# Patient Record
Sex: Female | Born: 1955 | Race: Black or African American | Hispanic: No | Marital: Married | Smoking: Never smoker
Health system: Southern US, Community
[De-identification: ages and names within clinical notes are randomized; demographics above are authoritative.]

## PROBLEM LIST (undated history)

## (undated) DIAGNOSIS — R633 Feeding difficulties, unspecified: Secondary | ICD-10-CM

## (undated) DIAGNOSIS — F329 Major depressive disorder, single episode, unspecified: Secondary | ICD-10-CM

## (undated) DIAGNOSIS — N179 Acute kidney failure, unspecified: Secondary | ICD-10-CM

## (undated) DIAGNOSIS — R1310 Dysphagia, unspecified: Secondary | ICD-10-CM

## (undated) DIAGNOSIS — I10 Essential (primary) hypertension: Secondary | ICD-10-CM

## (undated) DIAGNOSIS — M1990 Unspecified osteoarthritis, unspecified site: Secondary | ICD-10-CM

## (undated) DIAGNOSIS — G894 Chronic pain syndrome: Secondary | ICD-10-CM

## (undated) DIAGNOSIS — E119 Type 2 diabetes mellitus without complications: Secondary | ICD-10-CM

## (undated) DIAGNOSIS — R252 Cramp and spasm: Secondary | ICD-10-CM

## (undated) DIAGNOSIS — M24561 Contracture, right knee: Secondary | ICD-10-CM

## (undated) DIAGNOSIS — G9341 Metabolic encephalopathy: Secondary | ICD-10-CM

## (undated) DIAGNOSIS — T40601A Poisoning by unspecified narcotics, accidental (unintentional), initial encounter: Secondary | ICD-10-CM

## (undated) DIAGNOSIS — E039 Hypothyroidism, unspecified: Secondary | ICD-10-CM

## (undated) DIAGNOSIS — N390 Urinary tract infection, site not specified: Secondary | ICD-10-CM

## (undated) DIAGNOSIS — G20 Parkinson's disease: Secondary | ICD-10-CM

## (undated) DIAGNOSIS — B351 Tinea unguium: Secondary | ICD-10-CM

## (undated) DIAGNOSIS — G8100 Flaccid hemiplegia affecting unspecified side: Secondary | ICD-10-CM

## (undated) DIAGNOSIS — I639 Cerebral infarction, unspecified: Secondary | ICD-10-CM

## (undated) DIAGNOSIS — I69354 Hemiplegia and hemiparesis following cerebral infarction affecting left non-dominant side: Secondary | ICD-10-CM

## (undated) DIAGNOSIS — D696 Thrombocytopenia, unspecified: Secondary | ICD-10-CM

## (undated) DIAGNOSIS — M6281 Muscle weakness (generalized): Secondary | ICD-10-CM

## (undated) DIAGNOSIS — F29 Unspecified psychosis not due to a substance or known physiological condition: Secondary | ICD-10-CM

## (undated) DIAGNOSIS — K5900 Constipation, unspecified: Secondary | ICD-10-CM

## (undated) DIAGNOSIS — M24562 Contracture, left knee: Secondary | ICD-10-CM

## (undated) DIAGNOSIS — J9690 Respiratory failure, unspecified, unspecified whether with hypoxia or hypercapnia: Secondary | ICD-10-CM

## (undated) DIAGNOSIS — G20A1 Parkinson's disease without dyskinesia, without mention of fluctuations: Secondary | ICD-10-CM

## (undated) DIAGNOSIS — G4730 Sleep apnea, unspecified: Secondary | ICD-10-CM

## (undated) DIAGNOSIS — H04123 Dry eye syndrome of bilateral lacrimal glands: Secondary | ICD-10-CM

## (undated) DIAGNOSIS — F32A Depression, unspecified: Secondary | ICD-10-CM

## (undated) DIAGNOSIS — I739 Peripheral vascular disease, unspecified: Secondary | ICD-10-CM

## (undated) DIAGNOSIS — A419 Sepsis, unspecified organism: Secondary | ICD-10-CM

## (undated) DIAGNOSIS — F5101 Primary insomnia: Secondary | ICD-10-CM

## (undated) DIAGNOSIS — M353 Polymyalgia rheumatica: Secondary | ICD-10-CM

## (undated) DIAGNOSIS — E785 Hyperlipidemia, unspecified: Secondary | ICD-10-CM

## (undated) DIAGNOSIS — H269 Unspecified cataract: Secondary | ICD-10-CM

## (undated) DIAGNOSIS — R32 Unspecified urinary incontinence: Secondary | ICD-10-CM

## (undated) DIAGNOSIS — B372 Candidiasis of skin and nail: Secondary | ICD-10-CM

## (undated) DIAGNOSIS — K219 Gastro-esophageal reflux disease without esophagitis: Secondary | ICD-10-CM

## (undated) DIAGNOSIS — J9600 Acute respiratory failure, unspecified whether with hypoxia or hypercapnia: Secondary | ICD-10-CM

## (undated) DIAGNOSIS — Z87442 Personal history of urinary calculi: Secondary | ICD-10-CM

## (undated) DIAGNOSIS — G40909 Epilepsy, unspecified, not intractable, without status epilepticus: Secondary | ICD-10-CM

## (undated) DIAGNOSIS — G8929 Other chronic pain: Secondary | ICD-10-CM

## (undated) HISTORY — PX: PICC LINE INSERTION: CATH118290

---

## 1997-03-09 HISTORY — PX: CEREBRAL ANEURYSM REPAIR: SHX164

## 2006-07-05 ENCOUNTER — Inpatient Hospital Stay (HOSPITAL_COMMUNITY): Admission: EM | Admit: 2006-07-05 | Discharge: 2006-08-03 | Payer: Self-pay | Admitting: *Deleted

## 2006-07-05 ENCOUNTER — Ambulatory Visit: Payer: Self-pay | Admitting: Internal Medicine

## 2006-07-06 ENCOUNTER — Encounter (INDEPENDENT_AMBULATORY_CARE_PROVIDER_SITE_OTHER): Payer: Self-pay | Admitting: Cardiology

## 2006-07-15 ENCOUNTER — Ambulatory Visit: Payer: Self-pay | Admitting: Internal Medicine

## 2006-11-21 ENCOUNTER — Emergency Department (HOSPITAL_COMMUNITY): Admission: EM | Admit: 2006-11-21 | Discharge: 2006-11-21 | Payer: Self-pay | Admitting: Emergency Medicine

## 2008-03-09 HISTORY — PX: INTRATHECAL PUMP IMPLANTATION: SHX1844

## 2008-06-24 ENCOUNTER — Emergency Department (HOSPITAL_COMMUNITY): Admission: EM | Admit: 2008-06-24 | Discharge: 2008-06-24 | Payer: Self-pay | Admitting: Emergency Medicine

## 2009-02-05 ENCOUNTER — Emergency Department (HOSPITAL_COMMUNITY): Admission: EM | Admit: 2009-02-05 | Discharge: 2009-02-05 | Payer: Self-pay | Admitting: Emergency Medicine

## 2009-08-06 ENCOUNTER — Inpatient Hospital Stay (HOSPITAL_COMMUNITY): Admission: EM | Admit: 2009-08-06 | Discharge: 2009-08-09 | Payer: Self-pay | Admitting: Emergency Medicine

## 2009-08-11 ENCOUNTER — Inpatient Hospital Stay (HOSPITAL_COMMUNITY): Admission: EM | Admit: 2009-08-11 | Discharge: 2009-08-21 | Payer: Self-pay | Admitting: Emergency Medicine

## 2010-01-27 ENCOUNTER — Inpatient Hospital Stay (HOSPITAL_COMMUNITY)
Admission: EM | Admit: 2010-01-27 | Discharge: 2010-02-04 | Payer: Self-pay | Source: Home / Self Care | Admitting: Emergency Medicine

## 2010-02-01 ENCOUNTER — Other Ambulatory Visit: Payer: Self-pay | Admitting: Internal Medicine

## 2010-02-02 ENCOUNTER — Other Ambulatory Visit: Payer: Self-pay | Admitting: Internal Medicine

## 2010-02-03 ENCOUNTER — Other Ambulatory Visit: Payer: Self-pay | Admitting: Internal Medicine

## 2010-02-04 ENCOUNTER — Other Ambulatory Visit: Payer: Self-pay | Admitting: Internal Medicine

## 2010-03-08 ENCOUNTER — Emergency Department (HOSPITAL_COMMUNITY)
Admission: EM | Admit: 2010-03-08 | Discharge: 2010-03-08 | Payer: Self-pay | Source: Home / Self Care | Admitting: Emergency Medicine

## 2010-03-08 ENCOUNTER — Inpatient Hospital Stay (HOSPITAL_COMMUNITY)
Admission: EM | Admit: 2010-03-08 | Discharge: 2010-03-14 | Payer: Self-pay | Source: Home / Self Care | Attending: Internal Medicine | Admitting: Internal Medicine

## 2010-03-11 DIAGNOSIS — F29 Unspecified psychosis not due to a substance or known physiological condition: Secondary | ICD-10-CM

## 2010-03-12 DIAGNOSIS — F29 Unspecified psychosis not due to a substance or known physiological condition: Secondary | ICD-10-CM

## 2010-03-12 LAB — BASIC METABOLIC PANEL
BUN: 13 mg/dL (ref 6–23)
CO2: 28 mEq/L (ref 19–32)
Calcium: 9 mg/dL (ref 8.4–10.5)
Chloride: 110 mEq/L (ref 96–112)
Creatinine, Ser: 0.94 mg/dL (ref 0.4–1.2)
GFR calc Af Amer: >60 mL/min (ref >60)
GFR calc non Af Amer: >60 mL/min (ref >60)
Glucose, Bld: 93 mg/dL (ref 70–99)
Potassium: 3.2 mEq/L — ABNORMAL LOW (ref 3.5–5.1)
Sodium: 148 mEq/L — ABNORMAL HIGH (ref 135–145)

## 2010-03-12 LAB — CBC
HCT: 35.3 % — ABNORMAL LOW (ref 36.0–46.0)
Hemoglobin: 11.1 g/dL — ABNORMAL LOW (ref 12.0–15.0)
MCH: 27.3 pg (ref 26.0–34.0)
MCHC: 31.4 g/dL (ref 30.0–36.0)
MCV: 86.9 fL (ref 78.0–100.0)
Platelets: 159 10*3/uL (ref 150–400)
RBC: 4.06 MIL/uL (ref 3.87–5.11)
RDW: 15.5 % (ref 11.5–15.5)
WBC: 5.6 10*3/uL (ref 4.0–10.5)

## 2010-03-12 LAB — ALBUMIN: Albumin: 2.9 g/dL — ABNORMAL LOW (ref 3.5–5.2)

## 2010-03-12 LAB — PHENYTOIN LEVEL, TOTAL: Phenytoin Lvl: 12.1 ug/mL (ref 10.0–20.0)

## 2010-03-13 DIAGNOSIS — F29 Unspecified psychosis not due to a substance or known physiological condition: Secondary | ICD-10-CM

## 2010-03-13 LAB — CK: Total CK: 306 U/L — ABNORMAL HIGH (ref 7–177)

## 2010-03-14 DIAGNOSIS — F29 Unspecified psychosis not due to a substance or known physiological condition: Secondary | ICD-10-CM

## 2010-03-14 HISTORY — DX: Unspecified psychosis not due to a substance or known physiological condition: F29

## 2010-03-14 LAB — CK: Total CK: 241 U/L — ABNORMAL HIGH (ref 7–177)

## 2010-04-04 NOTE — H&P (Addendum)
NAMECHLORA, MCBAIN               ACCOUNT NO.:  [id number]  MEDICAL RECORD NO.:  [id number]          PATIENT TYPE:  EMS  LOCATION:  MAJO                         FACILITY:  [facility]  PHYSICIAN:  Richarda Overlie, MD       DATE OF BIRTH:  October 06, 1955  DATE OF ADMISSION:  03/09/2010 DATE OF DISCHARGE:                             HISTORY & PHYSICAL   PRIMARY CARE PHYSICIAN:  Maxwell Caul, MD  PAIN MEDICINE PHYSICIAN:  Dr. Jason Coop.  PRIMARY NEUROLOGIST:  Kofi A. Gerilyn Pilgrim, MD  CHIEF COMPLAINT:  Apnea at the nursing home.  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  The patient had apparently had a seizure at around 9:25 at the nursing home following which the patient was found to have shallow respirations as well as several episodes of apnea with a rate of 4-6 breaths per minute. She was sent to [facility] Emergency Room from the nursing home on March 08, 2010.  She was evaluated at [facility] ED and her workup was essentially negative.  She was sent back to the nursing home as she continued to have apneic episodes.  Upon arrival to the facility, the patient had a blood pressure 160/92, temperature 98.7, pulse of 90, and respirations of 10.  Oxygen saturation was found to be 96% on room air. The MD on call at the nursing home was notified that the residence respirations were about 6-10 per minute and the nursing home was instructed to send the patient back to [facility] ED.  The patient's apnea is mostly at rest and when she is asleep.  She does not recall the episode of seizure.  A postictal state was apparently prolonged for several hours.  The patient is currently alert and oriented  and is able to answer questions appropriately.  She is somnolent, but not lethargic and is easily aroused.  The patient denies any chest pain, any palpitations, any nausea, vomiting, abdominal pain, any diarrhea or constipation, any urinary urgency, frequency, or dysuria.  She is also found to have a mild urinary tract infection in the ED today.  The patient's seizures mostly appeared to be staring off into space without any tonic-clonic activity followed by prolonged episode of confusion and unresponsiveness.  The patient is currently hemodynamically stable and has been placed on BiPAP in the ED.  The patient had a similar episode in November 2011 when her fentanyl and her baclofen pump were adjusted by Dr. Charmayne Sheer.  It was also felt that the patient's apnea could be related to recurrent seizures and therefore the patient has been loaded with Dilantin in the ED here.  The patient was intubated for airway protection during her last hospitalization in November 2011.  Hypercarbic respiratory failure requiring intubation and mechanical ventilation in November 2011, central apnea with hypoventilation. Breakthrough seizures, on Keppra.  Chronic spasticity.  History of hemorrhagic CVA, history of aspiration pneumonia, hypothyroidism, spastic hemiparesis, status post tracheostomy and gastrostomy tube placement in May 2008 at the time of her hemorrhagic stroke, and parkinsonism.  Neuropathic pain, treated with fentanyl subcutaneously. Diet-controlled type 2 diabetes.  Depression.  Left-sided hemiparesis and nonambulatory status secondary to CVA, status post craniotomy with clipping of an aneurysm in 1999.  HOME MEDICATIONS:  Fentanyl pump, baclofen pump, vitamin D3, milk of magnesium, Tylenol, trazodone, omeprazole, Patanol, levothyroxine, Keppra, labetalol, lactulose, enalapril, Cymbalta, Sinemet, Ativan, aspirin, artificial tears, and Norvasc.  ALLERGIES:  The patient is allergic to  CODEINE.  SOCIAL HISTORY:  The patient is married with a resident of [employer].  She has 2 children.  She is a former Designer, jewellery.  She has no history of tobacco, alcohol, or illicit drug use.  FAMILY HISTORY:  Her mother is in her 85s and she has a history of hypertension and diabetes.  Father has a history of hypertension and type 2 diabetes.  REVIEW OF SYSTEMS:  Complete review of systems was done as documented in the HPI.  PHYSICAL EXAMINATION:  VITAL SIGNS:  Blood pressure 182/104, pulse of 115, respirations 18, and temperature 98.2. GENERAL:  The patient is alert, currently oriented to place and person, morbidly obese, but in no acute cardiopulmonary distress. HEENT:  Normocephalic and atraumatic.  Pupils are equal and reactive to light.  Extraocular movements are intact. NECK:  Supple.  No adenopathy.  No thyromegaly.  No bruit.  No JVD.  She has a former tracheostomy scar. HEART:  S1 and S2 with 2/6 systolic ejection murmur. RESPIRATIONS:  The breathing is shallow, however, when she is awake her respiratory rate is normal. ABDOMEN:  Obese, soft, nontender, and nondistended.  Normoactive bowel sounds. NEUROLOGIC:  The patient is currently alert without any gross focal cranial nerve deficits.  She has contractures in her left upper extremity and her left lower extremity.  She has decreased sensation on the left and intact on the right.  She has currently an appropriate mood and affect.  LABORATORY DATA:  TSH 1.609.  ABG:  PH of 7.421, pCO2 of 48.7, and pO2 of 86.  CMP:  Sodium 144, potassium 3.5, chloride 103, bicarb 30, glucose 77, BUN 19, creatinine 0.90, T-bili 0.6, alk phos 91, AST 18, ALT 9, total protein 7.7, and calcium 9.4.  WBC 6.9, hemoglobin 11.6, hematocrit 30.5, and platelet count of 147.  Urinalysis shows small amount of leukocyte esterase and 11-20 wbc's per high-power field.  CBC: WBC 6.4, hemoglobin 11.3, hematocrit 35.2,  and platelet count of 162.  CT of the head without contrast shows no acute intracranial findings.  ASSESSMENT AND PLAN:  This is a 55 year old female with a history of spastic hemiparesis secondary to hemorrhagic cerebrovascular accident, currently presenting with recurrent seizure and apneic episodes. 1. Apnea:  The patient has been noted to have apnea post her episodes     of seizure.  The patient did have an EEG done in November 2011 that     showed possible increased seizure activity and the dose of her     Keppra was increased to 2000 mg p.o. q.12.  She is also receiving a     loading dose of Dilantin.  The patient also has a component of     central apnea related to her hemorrhagic CVA.  It is possible that     the patient may be a long-term candidate for BiPAP at night when  she is asleep.  It is also possible that the patient needs further     decrease of her fentanyl and her baclofen pump which may be     contributing to the apnea.  This was initially adjusted by Dr. Charmayne Sheer from [facility].  A neurology consultation may be     obtained in the morning to receive further recommendations for     adjustment of this. 2. Urinary tract infection:  The patient will be treated with Rocephin    and a urine culture will be obtained. 3. Diabetes:  This appears to be controlled at this time, therefore no     NovoLog is being initiated. 4. Hypothyroidism:  The patient may be restarted on her Synthroid and     other p.o. medications after a speech evaluation is completed. 5. Parkinsonism:  The patient will be continued on her Sinemet. 6. History of hemorrhagic stroke:  The patient had a CT of the head     without contrast which appears to be stable.  If she continues to     have any worsening of her neurologic symptoms, a CT scan with     contrast may be obtained.  She is not a candidate for MRI because     of surgical clips in her brain. 7. Spastic hemiparesis:  The  patient is on fentanyl and baclofen pump     for this, which is managed by Dr. Windle Guard at [facility],     phone number [phone]. 8. Potential dysphasia:  A speech therapy consultation will be     obtained in the morning.  Until then, the patient will remain     n.p.o. except for medications only. 9. Disposition:  She is a full code.     Richarda Overlie, MD     NA/MEDQ  D:  03/09/2010  T:  03/09/2010  Job:  [id number]  Electronically Signed by Richarda Overlie MD on 04/04/2010 07:22:53 AM

## 2010-04-09 DIAGNOSIS — G4730 Sleep apnea, unspecified: Secondary | ICD-10-CM

## 2010-04-09 HISTORY — DX: Sleep apnea, unspecified: G47.30

## 2010-04-10 ENCOUNTER — Ambulatory Visit: Payer: Medicare Other | Attending: Neurology

## 2010-04-10 DIAGNOSIS — Z6839 Body mass index (BMI) 39.0-39.9, adult: Secondary | ICD-10-CM | POA: Insufficient documentation

## 2010-04-10 DIAGNOSIS — G4733 Obstructive sleep apnea (adult) (pediatric): Secondary | ICD-10-CM | POA: Insufficient documentation

## 2010-04-23 ENCOUNTER — Encounter (HOSPITAL_COMMUNITY): Payer: Self-pay | Admitting: Radiology

## 2010-04-23 ENCOUNTER — Emergency Department (HOSPITAL_COMMUNITY): Payer: Medicare Other

## 2010-04-23 ENCOUNTER — Inpatient Hospital Stay (HOSPITAL_COMMUNITY)
Admission: EM | Admit: 2010-04-23 | Discharge: 2010-05-06 | DRG: 071 | Disposition: A | Discharge status: Skilled Nursing Facility | Payer: Medicare Other | Attending: Family Medicine | Admitting: Family Medicine

## 2010-04-23 DIAGNOSIS — D638 Anemia in other chronic diseases classified elsewhere: Secondary | ICD-10-CM | POA: Diagnosis not present

## 2010-04-23 DIAGNOSIS — G9341 Metabolic encephalopathy: Principal | ICD-10-CM | POA: Diagnosis present

## 2010-04-23 DIAGNOSIS — G20A1 Parkinson's disease without dyskinesia, without mention of fluctuations: Secondary | ICD-10-CM | POA: Diagnosis present

## 2010-04-23 DIAGNOSIS — Z79899 Other long term (current) drug therapy: Secondary | ICD-10-CM

## 2010-04-23 DIAGNOSIS — F329 Major depressive disorder, single episode, unspecified: Secondary | ICD-10-CM | POA: Diagnosis present

## 2010-04-23 DIAGNOSIS — T420X5A Adverse effect of hydantoin derivatives, initial encounter: Secondary | ICD-10-CM | POA: Diagnosis present

## 2010-04-23 DIAGNOSIS — E039 Hypothyroidism, unspecified: Secondary | ICD-10-CM | POA: Diagnosis present

## 2010-04-23 DIAGNOSIS — N390 Urinary tract infection, site not specified: Secondary | ICD-10-CM | POA: Diagnosis present

## 2010-04-23 DIAGNOSIS — D696 Thrombocytopenia, unspecified: Secondary | ICD-10-CM | POA: Diagnosis present

## 2010-04-23 DIAGNOSIS — F3289 Other specified depressive episodes: Secondary | ICD-10-CM | POA: Diagnosis present

## 2010-04-23 DIAGNOSIS — E669 Obesity, unspecified: Secondary | ICD-10-CM | POA: Diagnosis present

## 2010-04-23 DIAGNOSIS — I69959 Hemiplegia and hemiparesis following unspecified cerebrovascular disease affecting unspecified side: Secondary | ICD-10-CM

## 2010-04-23 DIAGNOSIS — G589 Mononeuropathy, unspecified: Secondary | ICD-10-CM | POA: Diagnosis present

## 2010-04-23 DIAGNOSIS — R1310 Dysphagia, unspecified: Secondary | ICD-10-CM | POA: Diagnosis present

## 2010-04-23 DIAGNOSIS — I69998 Other sequelae following unspecified cerebrovascular disease: Secondary | ICD-10-CM

## 2010-04-23 DIAGNOSIS — K219 Gastro-esophageal reflux disease without esophagitis: Secondary | ICD-10-CM | POA: Diagnosis present

## 2010-04-23 DIAGNOSIS — E119 Type 2 diabetes mellitus without complications: Secondary | ICD-10-CM | POA: Diagnosis present

## 2010-04-23 DIAGNOSIS — G20 Parkinson's disease: Secondary | ICD-10-CM | POA: Diagnosis present

## 2010-04-23 DIAGNOSIS — E870 Hyperosmolality and hypernatremia: Secondary | ICD-10-CM | POA: Diagnosis not present

## 2010-04-23 DIAGNOSIS — Z7982 Long term (current) use of aspirin: Secondary | ICD-10-CM

## 2010-04-23 DIAGNOSIS — G40909 Epilepsy, unspecified, not intractable, without status epilepticus: Secondary | ICD-10-CM | POA: Diagnosis present

## 2010-04-23 DIAGNOSIS — B952 Enterococcus as the cause of diseases classified elsewhere: Secondary | ICD-10-CM | POA: Diagnosis present

## 2010-04-23 DIAGNOSIS — G4733 Obstructive sleep apnea (adult) (pediatric): Secondary | ICD-10-CM | POA: Diagnosis present

## 2010-04-23 DIAGNOSIS — I10 Essential (primary) hypertension: Secondary | ICD-10-CM | POA: Diagnosis present

## 2010-04-23 HISTORY — DX: Epilepsy, unspecified, not intractable, without status epilepticus: G40.909

## 2010-04-23 HISTORY — DX: Essential (primary) hypertension: I10

## 2010-04-23 HISTORY — DX: Cerebral infarction, unspecified: I63.9

## 2010-04-23 LAB — APTT: aPTT: 67 seconds — ABNORMAL HIGH (ref 24–37)

## 2010-04-23 LAB — MRSA PCR SCREENING: MRSA by PCR: NEGATIVE

## 2010-04-23 LAB — HEMOGLOBIN A1C: Mean Plasma Glucose: 120 mg/dL — ABNORMAL HIGH (ref <117)

## 2010-04-23 LAB — COMPREHENSIVE METABOLIC PANEL
ALT: 9 U/L (ref 0–35)
AST: 17 U/L (ref 0–37)
CO2: 33 mEq/L — ABNORMAL HIGH (ref 19–32)
Chloride: 103 mEq/L (ref 96–112)
GFR calc Af Amer: >60 mL/min (ref >60)
GFR calc non Af Amer: >60 mL/min (ref >60)
Sodium: 144 mEq/L (ref 135–145)
Total Bilirubin: 0.3 mg/dL (ref 0.3–1.2)

## 2010-04-23 LAB — URINALYSIS, ROUTINE W REFLEX MICROSCOPIC
Ketones, ur: 15 mg/dL — AB
Nitrite: NEGATIVE
Specific Gravity, Urine: 1.03 (ref 1.005–1.030)
Urine Glucose, Fasting: NEGATIVE mg/dL
pH: 6 (ref 5.0–8.0)

## 2010-04-23 LAB — DIFFERENTIAL
Basophils Absolute: 0 10*3/uL (ref 0.0–0.1)
Basophils Relative: 0 % (ref 0–1)
Eosinophils Absolute: 0.1 10*3/uL (ref 0.0–0.7)
Eosinophils Relative: 3 % (ref 0–5)
Lymphocytes Relative: 51 % — ABNORMAL HIGH (ref 12–46)
Monocytes Relative: 12 % (ref 3–12)
Neutro Abs: 1.8 10*3/uL (ref 1.7–7.7)

## 2010-04-23 LAB — PHENYTOIN LEVEL, TOTAL: Phenytoin Lvl: 27.9 ug/mL — ABNORMAL HIGH (ref 10.0–20.0)

## 2010-04-23 LAB — PROTIME-INR: Prothrombin Time: 14.5 seconds (ref 11.6–15.2)

## 2010-04-23 LAB — POCT I-STAT 3, ART BLOOD GAS (G3+)
Bicarbonate: 34.8 mEq/L — ABNORMAL HIGH (ref 20.0–24.0)
pCO2 arterial: 61.5 mmHg (ref 35.0–45.0)
pO2, Arterial: 87 mmHg (ref 80.0–100.0)

## 2010-04-23 LAB — GLUCOSE, CAPILLARY: Glucose-Capillary: 101 mg/dL — ABNORMAL HIGH (ref 70–99)

## 2010-04-23 LAB — BASIC METABOLIC PANEL
CO2: 30 mEq/L (ref 19–32)
Calcium: 8.3 mg/dL — ABNORMAL LOW (ref 8.4–10.5)
Chloride: 101 mEq/L (ref 96–112)
Creatinine, Ser: 0.55 mg/dL (ref 0.4–1.2)
GFR calc Af Amer: >60 mL/min (ref >60)
Glucose, Bld: 96 mg/dL (ref 70–99)

## 2010-04-23 LAB — VALPROIC ACID LEVEL: Valproic Acid Lvl: 58 ug/mL (ref 50.0–100.0)

## 2010-04-23 LAB — RAPID URINE DRUG SCREEN, HOSP PERFORMED
Amphetamines: NOT DETECTED
Opiates: NOT DETECTED
Tetrahydrocannabinol: NOT DETECTED

## 2010-04-23 LAB — CBC
Platelets: 146 10*3/uL — ABNORMAL LOW (ref 150–400)
RBC: 4.49 MIL/uL (ref 3.87–5.11)
RDW: 15 % (ref 11.5–15.5)
WBC: 5.2 10*3/uL (ref 4.0–10.5)

## 2010-04-23 LAB — CARDIAC PANEL(CRET KIN+CKTOT+MB+TROPI): Relative Index: 0.9 (ref 0.0–2.5)

## 2010-04-23 LAB — URINE MICROSCOPIC-ADD ON

## 2010-04-23 LAB — LACTIC ACID, PLASMA: Lactic Acid, Venous: 1.4 mmol/L (ref 0.5–2.2)

## 2010-04-23 LAB — SEDIMENTATION RATE: Sed Rate: 35 mm/hr — ABNORMAL HIGH (ref 0–22)

## 2010-04-24 ENCOUNTER — Inpatient Hospital Stay (HOSPITAL_COMMUNITY): Payer: Medicare Other

## 2010-04-24 LAB — GLUCOSE, CAPILLARY
Glucose-Capillary: 100 mg/dL — ABNORMAL HIGH (ref 70–99)
Glucose-Capillary: 104 mg/dL — ABNORMAL HIGH (ref 70–99)

## 2010-04-24 LAB — CBC
MCH: 27.7 pg (ref 26.0–34.0)
MCHC: 31.4 g/dL (ref 30.0–36.0)
Platelets: 147 10*3/uL — ABNORMAL LOW (ref 150–400)
RBC: 4.65 MIL/uL (ref 3.87–5.11)
RDW: 14.8 % (ref 11.5–15.5)

## 2010-04-24 LAB — DIFFERENTIAL
Basophils Relative: 0 % (ref 0–1)
Eosinophils Absolute: 0.1 10*3/uL (ref 0.0–0.7)
Eosinophils Relative: 3 % (ref 0–5)
Monocytes Relative: 12 % (ref 3–12)
Neutrophils Relative %: 46 % (ref 43–77)

## 2010-04-24 LAB — BASIC METABOLIC PANEL
BUN: 11 mg/dL (ref 6–23)
Calcium: 8.5 mg/dL (ref 8.4–10.5)
Creatinine, Ser: 0.52 mg/dL (ref 0.4–1.2)
GFR calc Af Amer: >60 mL/min (ref >60)
GFR calc non Af Amer: >60 mL/min (ref >60)

## 2010-04-24 LAB — LIPID PANEL
HDL: 62 mg/dL (ref >39)
Total CHOL/HDL Ratio: 4.7 RATIO
VLDL: 20 mg/dL (ref 0–40)

## 2010-04-24 LAB — FOLATE RBC: RBC Folate: 531 ng/mL (ref 180–600)

## 2010-04-24 LAB — CARDIAC PANEL(CRET KIN+CKTOT+MB+TROPI)
Total CK: 130 U/L (ref 7–177)
Troponin I: 0.02 ng/mL (ref 0.00–0.06)

## 2010-04-25 LAB — COMPREHENSIVE METABOLIC PANEL
Albumin: 2.6 g/dL — ABNORMAL LOW (ref 3.5–5.2)
BUN: 11 mg/dL (ref 6–23)
Chloride: 108 mEq/L (ref 96–112)
Creatinine, Ser: 0.58 mg/dL (ref 0.4–1.2)
GFR calc non Af Amer: >60 mL/min (ref >60)
Glucose, Bld: 89 mg/dL (ref 70–99)
Total Bilirubin: 0.3 mg/dL (ref 0.3–1.2)

## 2010-04-25 LAB — DIFFERENTIAL
Eosinophils Absolute: 0.1 10*3/uL (ref 0.0–0.7)
Eosinophils Relative: 2 % (ref 0–5)
Lymphs Abs: 3.2 10*3/uL (ref 0.7–4.0)
Monocytes Relative: 13 % — ABNORMAL HIGH (ref 3–12)
Neutrophils Relative %: 34 % — ABNORMAL LOW (ref 43–77)

## 2010-04-25 LAB — CBC
MCH: 26.8 pg (ref 26.0–34.0)
MCV: 88.1 fL (ref 78.0–100.0)
Platelets: 139 10*3/uL — ABNORMAL LOW (ref 150–400)
RBC: 3.96 MIL/uL (ref 3.87–5.11)

## 2010-04-25 LAB — FERRITIN: Ferritin: 26 ng/mL (ref 10–291)

## 2010-04-25 LAB — GLUCOSE, CAPILLARY
Glucose-Capillary: 181 mg/dL — ABNORMAL HIGH (ref 70–99)
Glucose-Capillary: 137 mg/dL — ABNORMAL HIGH (ref 70–99)

## 2010-04-25 LAB — FOLATE: Folate: 16.6 ng/mL

## 2010-04-25 LAB — IRON AND TIBC: TIBC: 239 ug/dL — ABNORMAL LOW (ref 250–470)

## 2010-04-26 LAB — CBC
Hemoglobin: 10.2 g/dL — ABNORMAL LOW (ref 12.0–15.0)
Platelets: 150 10*3/uL (ref 150–400)
RBC: 3.85 MIL/uL — ABNORMAL LOW (ref 3.87–5.11)
WBC: 5.5 10*3/uL (ref 4.0–10.5)

## 2010-04-26 LAB — GLUCOSE, CAPILLARY
Glucose-Capillary: 130 mg/dL — ABNORMAL HIGH (ref 70–99)
Glucose-Capillary: 116 mg/dL — ABNORMAL HIGH (ref 70–99)
Glucose-Capillary: 100 mg/dL — ABNORMAL HIGH (ref 70–99)
Glucose-Capillary: 142 mg/dL — ABNORMAL HIGH (ref 70–99)

## 2010-04-26 LAB — URINE CULTURE: Colony Count: 50000

## 2010-04-26 LAB — VALPROIC ACID LEVEL: Valproic Acid Lvl: 18.4 ug/mL — ABNORMAL LOW (ref 50.0–100.0)

## 2010-04-26 LAB — BASIC METABOLIC PANEL
CO2: 31 mEq/L (ref 19–32)
Chloride: 104 mEq/L (ref 96–112)
GFR calc Af Amer: >60 mL/min (ref >60)
Sodium: 143 mEq/L (ref 135–145)

## 2010-04-26 LAB — PHENYTOIN LEVEL, TOTAL: Phenytoin Lvl: 28.8 ug/mL — ABNORMAL HIGH (ref 10.0–20.0)

## 2010-04-27 LAB — CBC
HCT: 34.5 % — ABNORMAL LOW (ref 36.0–46.0)
MCH: 27.2 pg (ref 26.0–34.0)
MCV: 88.5 fL (ref 78.0–100.0)
Platelets: 156 10*3/uL (ref 150–400)
RBC: 3.9 MIL/uL (ref 3.87–5.11)

## 2010-04-27 LAB — BASIC METABOLIC PANEL
BUN: 5 mg/dL — ABNORMAL LOW (ref 6–23)
Chloride: 104 mEq/L (ref 96–112)
Creatinine, Ser: 0.46 mg/dL (ref 0.4–1.2)
Glucose, Bld: 93 mg/dL (ref 70–99)
Potassium: 3.8 mEq/L (ref 3.5–5.1)

## 2010-04-27 LAB — GLUCOSE, CAPILLARY
Glucose-Capillary: 87 mg/dL (ref 70–99)
Glucose-Capillary: 101 mg/dL — ABNORMAL HIGH (ref 70–99)

## 2010-04-28 LAB — GLUCOSE, CAPILLARY: Glucose-Capillary: 126 mg/dL — ABNORMAL HIGH (ref 70–99)

## 2010-04-29 LAB — CBC
MCH: 27 pg (ref 26.0–34.0)
MCHC: 31.1 g/dL (ref 30.0–36.0)
MCV: 86.9 fL (ref 78.0–100.0)
Platelets: 175 10*3/uL (ref 150–400)

## 2010-04-29 LAB — GLUCOSE, CAPILLARY
Glucose-Capillary: 91 mg/dL (ref 70–99)
Glucose-Capillary: 100 mg/dL — ABNORMAL HIGH (ref 70–99)
Glucose-Capillary: 104 mg/dL — ABNORMAL HIGH (ref 70–99)

## 2010-04-29 LAB — BASIC METABOLIC PANEL
BUN: 8 mg/dL (ref 6–23)
Creatinine, Ser: 0.54 mg/dL (ref 0.4–1.2)
GFR calc non Af Amer: >60 mL/min (ref >60)
Glucose, Bld: 99 mg/dL (ref 70–99)

## 2010-04-29 LAB — ALBUMIN: Albumin: 2.5 g/dL — ABNORMAL LOW (ref 3.5–5.2)

## 2010-04-30 LAB — CBC
HCT: 36 % (ref 36.0–46.0)
Hemoglobin: 10.9 g/dL — ABNORMAL LOW (ref 12.0–15.0)
MCH: 26.8 pg (ref 26.0–34.0)
MCHC: 30.3 g/dL (ref 30.0–36.0)
RDW: 14.9 % (ref 11.5–15.5)

## 2010-04-30 LAB — BASIC METABOLIC PANEL
BUN: 8 mg/dL (ref 6–23)
CO2: 35 mEq/L — ABNORMAL HIGH (ref 19–32)
Calcium: 8.7 mg/dL (ref 8.4–10.5)
Chloride: 102 mEq/L (ref 96–112)
Creatinine, Ser: 0.44 mg/dL (ref 0.4–1.2)
GFR calc Af Amer: >60 mL/min (ref >60)
GFR calc non Af Amer: >60 mL/min (ref >60)
Glucose, Bld: 83 mg/dL (ref 70–99)
Potassium: 4 mEq/L (ref 3.5–5.1)
Sodium: 145 mEq/L (ref 135–145)

## 2010-04-30 LAB — GLUCOSE, CAPILLARY: Glucose-Capillary: 98 mg/dL (ref 70–99)

## 2010-04-30 LAB — PHENYTOIN LEVEL, TOTAL: Phenytoin Lvl: 20.3 ug/mL — ABNORMAL HIGH (ref 10.0–20.0)

## 2010-05-01 LAB — BLOOD GAS, ARTERIAL
Delivery systems: POSITIVE
Drawn by: 290241
O2 Content: 4 L/min
pCO2 arterial: 68.4 mmHg (ref 35.0–45.0)
pH, Arterial: 7.358 (ref 7.350–7.400)
pO2, Arterial: 69.8 mmHg — ABNORMAL LOW (ref 80.0–100.0)

## 2010-05-01 LAB — MRSA PCR SCREENING: MRSA by PCR: NEGATIVE

## 2010-05-01 LAB — COMPREHENSIVE METABOLIC PANEL
AST: 29 U/L (ref 0–37)
CO2: 34 mEq/L — ABNORMAL HIGH (ref 19–32)
Calcium: 8.6 mg/dL (ref 8.4–10.5)
Chloride: 101 mEq/L (ref 96–112)
Creatinine, Ser: 0.47 mg/dL (ref 0.4–1.2)
GFR calc Af Amer: >60 mL/min (ref >60)
GFR calc non Af Amer: >60 mL/min (ref >60)
Glucose, Bld: 85 mg/dL (ref 70–99)
Total Bilirubin: 0.9 mg/dL (ref 0.3–1.2)

## 2010-05-01 LAB — GLUCOSE, CAPILLARY
Glucose-Capillary: 94 mg/dL (ref 70–99)
Glucose-Capillary: 82 mg/dL (ref 70–99)

## 2010-05-01 LAB — VALPROIC ACID LEVEL: Valproic Acid Lvl: 80.2 ug/mL (ref 50.0–100.0)

## 2010-05-01 LAB — PHENYTOIN LEVEL, TOTAL: Phenytoin Lvl: 20.4 ug/mL — ABNORMAL HIGH (ref 10.0–20.0)

## 2010-05-01 LAB — AMMONIA: Ammonia: 28 umol/L (ref 11–35)

## 2010-05-02 LAB — CBC
HCT: 33.1 % — ABNORMAL LOW (ref 36.0–46.0)
Hemoglobin: 10 g/dL — ABNORMAL LOW (ref 12.0–15.0)
MCH: 26.6 pg (ref 26.0–34.0)
MCV: 88 fL (ref 78.0–100.0)
Platelets: 125 10*3/uL — ABNORMAL LOW (ref 150–400)
RBC: 3.76 MIL/uL — ABNORMAL LOW (ref 3.87–5.11)
WBC: 3.9 10*3/uL — ABNORMAL LOW (ref 4.0–10.5)

## 2010-05-02 LAB — BLOOD GAS, ARTERIAL
Acid-Base Excess: 10 mmol/L — ABNORMAL HIGH (ref 0.0–2.0)
Bicarbonate: 35.7 mEq/L — ABNORMAL HIGH (ref 20.0–24.0)
O2 Saturation: 96.1 %
TCO2: 37.7 mmol/L (ref 0–100)
pCO2 arterial: 64.8 mmHg (ref 35.0–45.0)
pO2, Arterial: 83.9 mmHg (ref 80.0–100.0)

## 2010-05-02 LAB — COMPREHENSIVE METABOLIC PANEL
Albumin: 2.5 g/dL — ABNORMAL LOW (ref 3.5–5.2)
Alkaline Phosphatase: 41 U/L (ref 39–117)
BUN: 9 mg/dL (ref 6–23)
CO2: 36 mEq/L — ABNORMAL HIGH (ref 19–32)
Chloride: 102 mEq/L (ref 96–112)
GFR calc non Af Amer: >60 mL/min (ref >60)
Potassium: 3.6 mEq/L (ref 3.5–5.1)
Total Bilirubin: 0.5 mg/dL (ref 0.3–1.2)

## 2010-05-02 LAB — GLUCOSE, CAPILLARY
Glucose-Capillary: 89 mg/dL (ref 70–99)
Glucose-Capillary: 116 mg/dL — ABNORMAL HIGH (ref 70–99)
Glucose-Capillary: 118 mg/dL — ABNORMAL HIGH (ref 70–99)

## 2010-05-02 LAB — VALPROIC ACID LEVEL: Valproic Acid Lvl: 50.2 ug/mL (ref 50.0–100.0)

## 2010-05-02 LAB — PHENYTOIN LEVEL, FREE AND TOTAL: Phenytoin, Total: 34.9 mg/L — ABNORMAL HIGH (ref 10.0–20.0)

## 2010-05-03 DIAGNOSIS — J9620 Acute and chronic respiratory failure, unspecified whether with hypoxia or hypercapnia: Secondary | ICD-10-CM

## 2010-05-03 DIAGNOSIS — G4733 Obstructive sleep apnea (adult) (pediatric): Secondary | ICD-10-CM

## 2010-05-03 LAB — CBC
Hemoglobin: 10.2 g/dL — ABNORMAL LOW (ref 12.0–15.0)
MCHC: 30.7 g/dL (ref 30.0–36.0)
RDW: 14.8 % (ref 11.5–15.5)
WBC: 4.8 10*3/uL (ref 4.0–10.5)

## 2010-05-03 LAB — BASIC METABOLIC PANEL
Calcium: 8.6 mg/dL (ref 8.4–10.5)
GFR calc Af Amer: >60 mL/min (ref >60)
GFR calc non Af Amer: >60 mL/min (ref >60)
Sodium: 145 mEq/L (ref 135–145)

## 2010-05-03 LAB — GLUCOSE, CAPILLARY: Glucose-Capillary: 136 mg/dL — ABNORMAL HIGH (ref 70–99)

## 2010-05-04 LAB — COMPREHENSIVE METABOLIC PANEL
AST: 17 U/L (ref 0–37)
Albumin: 2.6 g/dL — ABNORMAL LOW (ref 3.5–5.2)
Alkaline Phosphatase: 42 U/L (ref 39–117)
Chloride: 98 mEq/L (ref 96–112)
GFR calc Af Amer: >60 mL/min (ref >60)
Potassium: 3.4 mEq/L — ABNORMAL LOW (ref 3.5–5.1)
Sodium: 139 mEq/L (ref 135–145)
Total Bilirubin: 0.4 mg/dL (ref 0.3–1.2)

## 2010-05-04 LAB — GLUCOSE, CAPILLARY
Glucose-Capillary: 83 mg/dL (ref 70–99)
Glucose-Capillary: 79 mg/dL (ref 70–99)
Glucose-Capillary: 109 mg/dL — ABNORMAL HIGH (ref 70–99)

## 2010-05-05 LAB — PHENYTOIN LEVEL, FREE AND TOTAL: Phenytoin, Free: 2 not reported

## 2010-05-05 LAB — GLUCOSE, CAPILLARY
Glucose-Capillary: 93 mg/dL (ref 70–99)
Glucose-Capillary: 101 mg/dL — ABNORMAL HIGH (ref 70–99)

## 2010-05-05 LAB — CBC
HCT: 34 % — ABNORMAL LOW (ref 36.0–46.0)
MCV: 87.9 fL (ref 78.0–100.0)
RDW: 15 % (ref 11.5–15.5)
WBC: 4.1 10*3/uL (ref 4.0–10.5)

## 2010-05-06 DIAGNOSIS — J9690 Respiratory failure, unspecified, unspecified whether with hypoxia or hypercapnia: Secondary | ICD-10-CM

## 2010-05-06 DIAGNOSIS — G9341 Metabolic encephalopathy: Secondary | ICD-10-CM

## 2010-05-06 DIAGNOSIS — N390 Urinary tract infection, site not specified: Secondary | ICD-10-CM

## 2010-05-06 HISTORY — DX: Urinary tract infection, site not specified: N39.0

## 2010-05-06 HISTORY — DX: Metabolic encephalopathy: G93.41

## 2010-05-06 HISTORY — DX: Respiratory failure, unspecified, unspecified whether with hypoxia or hypercapnia: J96.90

## 2010-05-06 LAB — COMPREHENSIVE METABOLIC PANEL
ALT: <8 U/L (ref 0–35)
BUN: 7 mg/dL (ref 6–23)
Calcium: 8.5 mg/dL (ref 8.4–10.5)
Creatinine, Ser: 0.45 mg/dL (ref 0.4–1.2)
Glucose, Bld: 84 mg/dL (ref 70–99)
Sodium: 145 mEq/L (ref 135–145)
Total Protein: 5.6 g/dL — ABNORMAL LOW (ref 6.0–8.3)

## 2010-05-06 LAB — LEVETIRACETAM LEVEL: Levetiracetam Lvl: 23.7 ug/mL (ref 5.0–30.0)

## 2010-05-06 LAB — GLUCOSE, CAPILLARY
Glucose-Capillary: 85 mg/dL (ref 70–99)
Glucose-Capillary: 87 mg/dL (ref 70–99)

## 2010-05-06 LAB — VALPROIC ACID LEVEL: Valproic Acid Lvl: 74.7 ug/mL (ref 50.0–100.0)

## 2010-05-06 LAB — PHENYTOIN LEVEL, TOTAL: Phenytoin Lvl: 14.8 ug/mL (ref 10.0–20.0)

## 2010-05-15 ENCOUNTER — Ambulatory Visit (HOSPITAL_COMMUNITY)
Admission: RE | Admit: 2010-05-15 | Discharge: 2010-05-15 | Disposition: A | Discharge status: Home / Self Care | Payer: Medicare Other | Source: Ambulatory Visit | Attending: Internal Medicine | Admitting: Internal Medicine

## 2010-05-15 DIAGNOSIS — Z8673 Personal history of transient ischemic attack (TIA), and cerebral infarction without residual deficits: Secondary | ICD-10-CM | POA: Insufficient documentation

## 2010-05-15 DIAGNOSIS — R9401 Abnormal electroencephalogram [EEG]: Secondary | ICD-10-CM | POA: Insufficient documentation

## 2010-05-15 DIAGNOSIS — R569 Unspecified convulsions: Secondary | ICD-10-CM | POA: Insufficient documentation

## 2010-05-16 NOTE — Consult Note (Addendum)
  [name], DAZEY               ACCOUNT NO.:  [id number]  MEDICAL RECORD NO.:  [id number]           PATIENT TYPE:  O  LOCATION:  EE                           FACILITY:  [facility]  PHYSICIAN:  Levie Heritage, MD       DATE OF BIRTH:  Aug 02, 1955  DATE OF CONSULTATION: DATE OF DISCHARGE:  05/15/2010                                CONSULTATION   EEG REPORT  HISTORY:  The patient is a 55 year old woman from nursing home with known history of stroke 4 years ago and has had seizures since then. The family states that she had some seizure-like activity with full body jerking and eye rolling in the back, and has been sleepy afterward.  The EEG has been ordered for further evaluation.  EEG DURATION:  This is a 22.5 minutes of EEG tracing.  EEG DESCRIPTION:  This is a routine 18-channel EEG recording with one channel devoted to limited EKG recording.  Activation procedure was performed using the photic stimulation and the study does not reflect any state change from sleep versus awake.  As the EEG opens up, I do not see any clear posterior dominant rhythm; however, the background rhythm consist of high-Delta to low-Theta ranges, generalized slowing and is reactive.  EKG artifact is predominantly noted for the most part of the study.  No driving was noted with the photic stimulation and posterior leads.  No seizures or epileptiform discharges are recorded during the study either.  EEG INTERPRETATION:  This is an abnormal EEG due to the generalized low- Theta to Delta range slowing and is reactive.  CLINICAL NOTE:  This degree of slowing reflects mild degree of encephalopathy, but is totally nonspecific.  Similar pattern can be seen in variety of toxic, metabolic, infectious, or traumatic and clinically correlation is advised.          ______________________________ Levie Heritage, MD     WS/MEDQ  D:  05/15/2010  T:  05/16/2010  Job:  [id number]  Electronically Signed by Levie Heritage  MD on 05/16/2010 12:12:51 PM

## 2010-05-19 LAB — URINE CULTURE
Colony Count: NO GROWTH
Colony Count: >=100000
Culture  Setup Time: 201201021220

## 2010-05-19 LAB — URINE MICROSCOPIC-ADD ON

## 2010-05-19 LAB — BLOOD GAS, ARTERIAL
Acid-Base Excess: 5 mmol/L — ABNORMAL HIGH (ref 0.0–2.0)
Acid-Base Excess: 6.5 mmol/L — ABNORMAL HIGH (ref 0.0–2.0)
Bicarbonate: 30 mEq/L — ABNORMAL HIGH (ref 20.0–24.0)
Bicarbonate: 31.1 mEq/L — ABNORMAL HIGH (ref 20.0–24.0)
Drawn by: 129711
Expiratory PAP: 5
FIO2: 0.3 %
O2 Content: 3 L/min
Patient temperature: 98.1
Patient temperature: 37
TCO2: 28.9 mmol/L (ref 0–100)
pCO2 arterial: 53.2 mmHg — ABNORMAL HIGH (ref 35.0–45.0)
pCO2 arterial: 58.6 mmHg (ref 35.0–45.0)
pH, Arterial: 7.342 — ABNORMAL LOW (ref 7.350–7.400)
pH, Arterial: 7.386 (ref 7.350–7.400)
pO2, Arterial: 98 mmHg (ref 80.0–100.0)

## 2010-05-19 LAB — CK TOTAL AND CKMB (NOT AT ARMC)
CK, MB: 2.9 ng/mL (ref 0.3–4.0)
Relative Index: 1.1 (ref 0.0–2.5)
Total CK: 269 U/L — ABNORMAL HIGH (ref 7–177)

## 2010-05-19 LAB — TROPONIN I: Troponin I: 0.02 ng/mL (ref 0.00–0.06)

## 2010-05-19 LAB — COMPREHENSIVE METABOLIC PANEL
ALT: <8 U/L (ref 0–35)
AST: 16 U/L (ref 0–37)
AST: 18 U/L (ref 0–37)
Albumin: 3.6 g/dL (ref 3.5–5.2)
Albumin: 3.6 g/dL (ref 3.5–5.2)
Alkaline Phosphatase: 88 U/L (ref 39–117)
BUN: 19 mg/dL (ref 6–23)
Calcium: 9.4 mg/dL (ref 8.4–10.5)
Creatinine, Ser: 0.9 mg/dL (ref 0.4–1.2)
GFR calc Af Amer: >60 mL/min (ref >60)
GFR calc Af Amer: >60 mL/min (ref >60)
GFR calc non Af Amer: >60 mL/min (ref >60)
Glucose, Bld: 106 mg/dL — ABNORMAL HIGH (ref 70–99)
Potassium: 3.7 mEq/L (ref 3.5–5.1)
Sodium: 144 mEq/L (ref 135–145)
Total Protein: 7.3 g/dL (ref 6.0–8.3)

## 2010-05-19 LAB — GLUCOSE, CAPILLARY
Glucose-Capillary: 107 mg/dL — ABNORMAL HIGH (ref 70–99)
Glucose-Capillary: 119 mg/dL — ABNORMAL HIGH (ref 70–99)
Glucose-Capillary: 92 mg/dL (ref 70–99)

## 2010-05-19 LAB — CBC
HCT: 39.3 % (ref 36.0–46.0)
HCT: 35.2 % — ABNORMAL LOW (ref 36.0–46.0)
Hemoglobin: 12.3 g/dL (ref 12.0–15.0)
MCH: 27.2 pg (ref 26.0–34.0)
MCHC: 31.8 g/dL (ref 30.0–36.0)
MCV: 87.5 fL (ref 78.0–100.0)
MCV: 85.5 fL (ref 78.0–100.0)
Platelets: 147 10*3/uL — ABNORMAL LOW (ref 150–400)
Platelets: 162 10*3/uL (ref 150–400)
RBC: 4.49 MIL/uL (ref 3.87–5.11)
RDW: 15.4 % (ref 11.5–15.5)
WBC: 7.5 10*3/uL (ref 4.0–10.5)
WBC: 6.4 10*3/uL (ref 4.0–10.5)

## 2010-05-19 LAB — URINALYSIS, ROUTINE W REFLEX MICROSCOPIC
Bilirubin Urine: NEGATIVE
Glucose, UA: NEGATIVE mg/dL
Ketones, ur: 15 mg/dL — AB
Protein, ur: 100 mg/dL — AB
Specific Gravity, Urine: >1.03 — ABNORMAL HIGH (ref 1.005–1.030)
Urobilinogen, UA: 1 mg/dL (ref 0.0–1.0)
Urobilinogen, UA: 0.2 mg/dL (ref 0.0–1.0)

## 2010-05-19 LAB — POCT I-STAT 3, ART BLOOD GAS (G3+)
Acid-Base Excess: 6 mmol/L — ABNORMAL HIGH (ref 0.0–2.0)
O2 Saturation: 97 %
Patient temperature: 98.6

## 2010-05-19 LAB — POCT CARDIAC MARKERS: Troponin i, poc: <0.05 ng/mL (ref 0.00–0.09)

## 2010-05-19 LAB — TSH: TSH: 1.609 u[IU]/mL (ref 0.350–4.500)

## 2010-05-19 LAB — DIFFERENTIAL
Basophils Relative: 0 % (ref 0–1)
Eosinophils Absolute: 0.2 10*3/uL (ref 0.0–0.7)
Eosinophils Relative: 2 % (ref 0–5)
Eosinophils Relative: 3 % (ref 0–5)
Lymphocytes Relative: 35 % (ref 12–46)
Lymphs Abs: 2.4 10*3/uL (ref 0.7–4.0)
Monocytes Absolute: 0.4 10*3/uL (ref 0.1–1.0)
Monocytes Absolute: 0.6 10*3/uL (ref 0.1–1.0)
Monocytes Relative: 7 % (ref 3–12)
Neutro Abs: 3.7 10*3/uL (ref 1.7–7.7)

## 2010-05-19 LAB — CARDIAC PANEL(CRET KIN+CKTOT+MB+TROPI): Relative Index: 1.1 (ref 0.0–2.5)

## 2010-05-20 LAB — BLOOD GAS, ARTERIAL
Bicarbonate: 26.6 mEq/L — ABNORMAL HIGH (ref 20.0–24.0)
Bicarbonate: 25.4 mEq/L — ABNORMAL HIGH (ref 20.0–24.0)
Bicarbonate: 25.8 mEq/L — ABNORMAL HIGH (ref 20.0–24.0)
Expiratory PAP: 5
Inspiratory PAP: 12
MECHVT: 550 mL
O2 Content: 2 L/min
O2 Content: 1 L/min
O2 Content: 1 L/min
O2 Saturation: 98.1 %
O2 Saturation: 99.5 %
PEEP: 5 cmH2O
PEEP: 5 cmH2O
Patient temperature: 98.6
Patient temperature: 37
Patient temperature: 37
Patient temperature: 37
RATE: 14 resp/min
RATE: 14 resp/min
pCO2 arterial: 31.1 mmHg — ABNORMAL LOW (ref 35.0–45.0)
pCO2 arterial: 38.5 mmHg (ref 35.0–45.0)
pCO2 arterial: 49.8 mmHg — ABNORMAL HIGH (ref 35.0–45.0)
pH, Arterial: 7.299 — ABNORMAL LOW (ref 7.350–7.400)
pH, Arterial: 7.441 — ABNORMAL HIGH (ref 7.350–7.400)
pH, Arterial: 7.477 — ABNORMAL HIGH (ref 7.350–7.400)
pO2, Arterial: 102 mmHg — ABNORMAL HIGH (ref 80.0–100.0)
pO2, Arterial: 110 mmHg — ABNORMAL HIGH (ref 80.0–100.0)
pO2, Arterial: 83.1 mmHg (ref 80.0–100.0)

## 2010-05-20 LAB — CBC
HCT: 34.7 % — ABNORMAL LOW (ref 36.0–46.0)
HCT: 31.7 % — ABNORMAL LOW (ref 36.0–46.0)
HCT: 32.2 % — ABNORMAL LOW (ref 36.0–46.0)
HCT: 32.2 % — ABNORMAL LOW (ref 36.0–46.0)
Hemoglobin: 10.9 g/dL — ABNORMAL LOW (ref 12.0–15.0)
Hemoglobin: 10.3 g/dL — ABNORMAL LOW (ref 12.0–15.0)
Hemoglobin: 10.5 g/dL — ABNORMAL LOW (ref 12.0–15.0)
Hemoglobin: 10.6 g/dL — ABNORMAL LOW (ref 12.0–15.0)
Hemoglobin: 11.1 g/dL — ABNORMAL LOW (ref 12.0–15.0)
MCH: 27.5 pg (ref 26.0–34.0)
MCH: 27.5 pg (ref 26.0–34.0)
MCH: 27.7 pg (ref 26.0–34.0)
MCH: 27.7 pg (ref 26.0–34.0)
MCHC: 32.3 g/dL (ref 30.0–36.0)
MCHC: 32.5 g/dL (ref 30.0–36.0)
MCHC: 32.9 g/dL (ref 30.0–36.0)
MCV: 87.8 fL (ref 78.0–100.0)
MCV: 84.5 fL (ref 78.0–100.0)
MCV: 85.9 fL (ref 78.0–100.0)
MCV: 86 fL (ref 78.0–100.0)
Platelets: 129 10*3/uL — ABNORMAL LOW (ref 150–400)
Platelets: 139 10*3/uL — ABNORMAL LOW (ref 150–400)
Platelets: 164 10*3/uL (ref 150–400)
RBC: 3.95 MIL/uL (ref 3.87–5.11)
RBC: 3.79 MIL/uL — ABNORMAL LOW (ref 3.87–5.11)
RBC: 4.04 MIL/uL (ref 3.87–5.11)
RDW: 16.3 % — ABNORMAL HIGH (ref 11.5–15.5)
RDW: 16.4 % — ABNORMAL HIGH (ref 11.5–15.5)
RDW: 16.4 % — ABNORMAL HIGH (ref 11.5–15.5)
RDW: 16.5 % — ABNORMAL HIGH (ref 11.5–15.5)
WBC: 5.2 10*3/uL (ref 4.0–10.5)

## 2010-05-20 LAB — BASIC METABOLIC PANEL
BUN: 13 mg/dL (ref 6–23)
BUN: 13 mg/dL (ref 6–23)
BUN: 5 mg/dL — ABNORMAL LOW (ref 6–23)
BUN: 7 mg/dL (ref 6–23)
CO2: 25 mEq/L (ref 19–32)
CO2: 25 mEq/L (ref 19–32)
CO2: 25 mEq/L (ref 19–32)
CO2: 31 mEq/L (ref 19–32)
Calcium: 8.9 mg/dL (ref 8.4–10.5)
Calcium: 9 mg/dL (ref 8.4–10.5)
Chloride: 111 mEq/L (ref 96–112)
Chloride: 107 mEq/L (ref 96–112)
Chloride: 113 mEq/L — ABNORMAL HIGH (ref 96–112)
Creatinine, Ser: 0.78 mg/dL (ref 0.4–1.2)
Creatinine, Ser: 0.83 mg/dL (ref 0.4–1.2)
GFR calc Af Amer: >60 mL/min (ref >60)
GFR calc Af Amer: >60 mL/min (ref >60)
GFR calc non Af Amer: >60 mL/min (ref >60)
GFR calc non Af Amer: >60 mL/min (ref >60)
GFR calc non Af Amer: >60 mL/min (ref >60)
Glucose, Bld: 102 mg/dL — ABNORMAL HIGH (ref 70–99)
Glucose, Bld: 116 mg/dL — ABNORMAL HIGH (ref 70–99)
Glucose, Bld: 95 mg/dL (ref 70–99)
Glucose, Bld: 99 mg/dL (ref 70–99)
Potassium: 3.9 mEq/L (ref 3.5–5.1)
Potassium: 3.4 mEq/L — ABNORMAL LOW (ref 3.5–5.1)
Potassium: 3.9 mEq/L (ref 3.5–5.1)
Sodium: 145 mEq/L (ref 135–145)
Sodium: 145 mEq/L (ref 135–145)
Sodium: 146 mEq/L — ABNORMAL HIGH (ref 135–145)

## 2010-05-20 LAB — GLUCOSE, CAPILLARY
Glucose-Capillary: 111 mg/dL — ABNORMAL HIGH (ref 70–99)
Glucose-Capillary: 119 mg/dL — ABNORMAL HIGH (ref 70–99)
Glucose-Capillary: 121 mg/dL — ABNORMAL HIGH (ref 70–99)
Glucose-Capillary: 122 mg/dL — ABNORMAL HIGH (ref 70–99)
Glucose-Capillary: 126 mg/dL — ABNORMAL HIGH (ref 70–99)
Glucose-Capillary: 135 mg/dL — ABNORMAL HIGH (ref 70–99)
Glucose-Capillary: 70 mg/dL (ref 70–99)
Glucose-Capillary: 76 mg/dL (ref 70–99)
Glucose-Capillary: 106 mg/dL — ABNORMAL HIGH (ref 70–99)
Glucose-Capillary: 107 mg/dL — ABNORMAL HIGH (ref 70–99)
Glucose-Capillary: 112 mg/dL — ABNORMAL HIGH (ref 70–99)
Glucose-Capillary: 120 mg/dL — ABNORMAL HIGH (ref 70–99)
Glucose-Capillary: 130 mg/dL — ABNORMAL HIGH (ref 70–99)
Glucose-Capillary: 95 mg/dL (ref 70–99)

## 2010-05-20 LAB — RAPID URINE DRUG SCREEN, HOSP PERFORMED
Amphetamines: NOT DETECTED
Cocaine: NOT DETECTED
Opiates: NOT DETECTED
Tetrahydrocannabinol: NOT DETECTED

## 2010-05-20 LAB — COMPREHENSIVE METABOLIC PANEL
ALT: 9 U/L (ref 0–35)
AST: 22 U/L (ref 0–37)
Albumin: 3.3 g/dL — ABNORMAL LOW (ref 3.5–5.2)
CO2: 25 mEq/L (ref 19–32)
Calcium: 9 mg/dL (ref 8.4–10.5)
Chloride: 109 mEq/L (ref 96–112)
GFR calc Af Amer: >60 mL/min (ref >60)
GFR calc non Af Amer: >60 mL/min (ref >60)
Sodium: 145 mEq/L (ref 135–145)
Total Bilirubin: 1.2 mg/dL (ref 0.3–1.2)

## 2010-05-20 LAB — DIFFERENTIAL
Basophils Absolute: 0 10*3/uL (ref 0.0–0.1)
Basophils Absolute: 0 10*3/uL (ref 0.0–0.1)
Basophils Absolute: 0 10*3/uL (ref 0.0–0.1)
Basophils Absolute: 0 10*3/uL (ref 0.0–0.1)
Basophils Relative: 0 % (ref 0–1)
Basophils Relative: 0 % (ref 0–1)
Basophils Relative: 0 % (ref 0–1)
Eosinophils Absolute: 0.1 10*3/uL (ref 0.0–0.7)
Eosinophils Absolute: 0.2 10*3/uL (ref 0.0–0.7)
Eosinophils Absolute: 0.2 10*3/uL (ref 0.0–0.7)
Eosinophils Absolute: 0.2 10*3/uL (ref 0.0–0.7)
Eosinophils Relative: 2 % (ref 0–5)
Eosinophils Relative: 3 % (ref 0–5)
Eosinophils Relative: 4 % (ref 0–5)
Eosinophils Relative: 4 % (ref 0–5)
Lymphs Abs: 2.2 10*3/uL (ref 0.7–4.0)
Monocytes Absolute: 0.4 10*3/uL (ref 0.1–1.0)
Monocytes Absolute: 0.4 10*3/uL (ref 0.1–1.0)
Monocytes Absolute: 0.5 10*3/uL (ref 0.1–1.0)
Monocytes Absolute: 0.5 10*3/uL (ref 0.1–1.0)
Monocytes Absolute: 0.5 10*3/uL (ref 0.1–1.0)
Monocytes Relative: 8 % (ref 3–12)
Monocytes Relative: 8 % (ref 3–12)
Neutro Abs: 2.6 10*3/uL (ref 1.7–7.7)
Neutro Abs: 2.6 10*3/uL (ref 1.7–7.7)
Neutro Abs: 2.7 10*3/uL (ref 1.7–7.7)
Neutrophils Relative %: 44 % (ref 43–77)

## 2010-05-20 LAB — URINE MICROSCOPIC-ADD ON

## 2010-05-20 LAB — URINALYSIS, ROUTINE W REFLEX MICROSCOPIC
Leukocytes, UA: NEGATIVE
Protein, ur: 30 mg/dL — AB
Specific Gravity, Urine: 1.02 (ref 1.005–1.030)

## 2010-05-20 LAB — HEPATIC FUNCTION PANEL
Alkaline Phosphatase: 83 U/L (ref 39–117)
Bilirubin, Direct: 0.1 mg/dL (ref 0.0–0.3)
Indirect Bilirubin: 0.6 mg/dL (ref 0.3–0.9)
Total Protein: 7.1 g/dL (ref 6.0–8.3)

## 2010-05-20 LAB — LIPASE, BLOOD: Lipase: 16 U/L (ref 11–59)

## 2010-05-20 LAB — POCT I-STAT 3, ART BLOOD GAS (G3+)
Bicarbonate: 27.9 mEq/L — ABNORMAL HIGH (ref 20.0–24.0)
TCO2: 29 mmol/L (ref 0–100)
pCO2 arterial: 51.3 mmHg — ABNORMAL HIGH (ref 35.0–45.0)
pH, Arterial: 7.343 — ABNORMAL LOW (ref 7.350–7.400)
pO2, Arterial: 59 mmHg — ABNORMAL LOW (ref 80.0–100.0)

## 2010-05-20 LAB — URINE CULTURE
Culture: NO GROWTH
Special Requests: POSITIVE

## 2010-05-20 LAB — CARDIAC PANEL(CRET KIN+CKTOT+MB+TROPI)
CK, MB: 3.3 ng/mL (ref 0.3–4.0)
CK, MB: 4.2 ng/mL — ABNORMAL HIGH (ref 0.3–4.0)
Relative Index: 0.8 (ref 0.0–2.5)
Total CK: 505 U/L — ABNORMAL HIGH (ref 7–177)
Troponin I: 0.01 ng/mL (ref 0.00–0.06)

## 2010-05-20 LAB — T4, FREE: Free T4: 1.05 ng/dL (ref 0.80–1.80)

## 2010-05-20 LAB — MRSA PCR SCREENING: MRSA by PCR: NEGATIVE

## 2010-05-22 NOTE — Progress Notes (Signed)
NAMESERENA, [name]               ACCOUNT NO.:  [id number]  MEDICAL RECORD NO.:  [id number]           PATIENT TYPE:  I  LOCATION:  4706                         FACILITY:  [facility]  PHYSICIAN:  [facility]      DATE OF BIRTH:  1955/10/13                                PROGRESS NOTE   CURRENT DIAGNOSES: 1. Metabolic encephalopathy secondary to Dilantin toxicity, improved. 2. Dilantin toxicity. 3. Enterococcus urinary tract infection. 4. Dysphagia. 5. History of seizures. 6. Anemia of chronic disease. 7. Hypertension. 8. Hypothyroidism. 9. Type 2 diabetes. 10.Severe central sleep apnea. 11.Gastroesophageal reflux syndrome. 12.History of hemorrhagic cerebrovascular accident status post     craniotomy with clipping of aneurysm in 1999. 13.History of chronic depression. 14.History of psychosis.  CURRENT MEDICATIONS: 1. Augmentin 875 mg p.o. b.i.d. 2. Aspirin 81 mg p.o. daily. 3. Sinemet. 4. Depakote 1000 mg p.o. b.i.d. 5. Cymbalta 60 mg p.o. daily. 6. Ensure 237 mL p.o. t.i.d. 7. NovoLog sliding scale. 8. Labetalol 200 mg p.o. b.i.d. 9. Vimpat 50 mg p.o. b.i.d. 10.Keppra 2000 mg p.o. b.i.d. 11.Synthroid 15 mcg p.o. daily. 12.Patanol 1 drop to eye at bedtime. 13.Protonix 40 mg p.o. daily. 14.Dilantin 100 mg p.o. b.i.d. 15.Risperdal 0.25 mg p.o. at bedtime.  CONSULTATIONS DONE: Neurology consultation was done.  The patient was seen in consultation by Dr. Melvyn Novas, of [facility] on April 23, 2010.  PROCEDURES PERFORMED: A chest x-ray was done on April 23, 2010, that showed no acute findings.  CT of the head without contrast was done on April 23, 2010, that showed no acute findings.  CT of the head without contrast was done on April 24, 2010, that showed stable exam.  No acute intracranial abnormality.  An EEG was done on April 24, 2010, that showed abnormal study due to moderate bilateral cerebral dysfunction, diffuse  background slowing.  No evidence of epileptiform discharge, etiology metabolic toxic.  BRIEF ADMISSION HISTORY AND PHYSICAL: 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  The patient opened the eyes only to noxious stimuli, but falls back to sleep and as such most of the history was obtained from the nursing home records and the ED records. Per nursing home and ED records, the patient is with decreased level of consciousness over the past 2-3 days at the nursing home, where it was felt she was more alert before in the past, but baseline had been deemed to be more convulsant.  The patient was subsequently brought to the ED secondary to worsening level of consciousness.  Urinalysis which was done was negative for nitrates, showed moderate leukocyte, 7-10 white blood cells, UDS which was done was negative.  Head CT which was done was negative.  Chest x-ray  was negative.  BMET within normal limits. CBC within normal limits.  EKG showed sinus rhythm with first-degree AV block, alcohol level was left than 5, lactic acid was 1.4, Depakote level of 58, Dilantin level of 26.4, and we will call to admit the patient for further evaluation.  For the rest of admission history and physical, please see H and P of job number [id number].  HOSPITAL COURSE: 1. Metabolic encephalopathy/Dilantin toxicity.  The patient was     admitted with metabolic encephalopathy.  The patient was very     lethargic on admission.  ABG which was obtained  on admission did     show a normal pH with an elevated pCO2 and a bicarb level, which     was noted that was likely due to compensation secondary to sleep     apnea.  A pH which was obtained was 7.37, pCO2 of 53, and bicarb     level of 30.  A CT of the head, which was done was negative.  MRI     was unable to be done secondary to unknown clips in the patient's     aneurysm and as such, repeat CT angio was done on April 24, 2010, with results as stated above, which was negative.  EEG, which     was obtained with results as stated above was consistent with a     toxic metabolic encephalopathy and negative for any epileptiform     discharges.  A Neurology consultation was done.  The patient was     seen in consultation by Dr. Vickey Huger on April 23, 2010, it was     felt that with the patient's Dilantin level of 26.8 and a albumin     of 2.9, a corrected up Dilantin level was 38.8, it was felt that     the patient's symptoms were secondary to her Dilantin toxicity and     as such Dilantin was held.  Pharmacy was consulted to dose the     patient's Dilantin.  The patient was monitored and the patient was     also placed on IV antibiotics for treatment of a UTI as it was felt     may have a component to the patient's metabolic encephalopathy.     The patient was followed.  The patient improved.  The patient was     more alert on hospital day #2.  The patient improved clinically.     On hospital day #2, the patient did have nystagmus in all     directions.  As the hospitalization days increased, the patient     improved clinically, had nystagmus resolved.  Dilantin levels were     being followed per pharmacy.  Neurology was also following the     patient as well.  The patient was placed on a lower doses of her     Dilantin, as her Dilantin levels were being followed to help     prevent seizures.  The patient was placed back on Keppra and having     placed on the Depakote as well.  The  patient improved clinically.     She was essentially close to her baseline at time of this dictation     and was improving during the hospitalization.  Main issues now are     to get the patient's Dilantin level within therapeutic range and to     get new doses for her Dilantin on discharge,  which will be     recommended per pharmacy.  The patient's antibiotic has been     switched to Augmentin orally to complete a course of antibiotic     therapy for UTI.  The patient is currently in stable and improved     condition. 2. Enterococcus UTI.  On admission, the patient was noted to have a     urinary tract infection.  Urine cultures, which were obtained did     grow 50,000 enterococcus species.  The patient was initially placed     on IV Rocephin.  However enterococcus species where sensitive only     to ampicillin, levofloxacin, Macrobid and the patient's Rocephin     was subsequently changed.  She was placed on IV ampicillin and     subsequently transitioned to oral Augmentin.  The patient will     continue on oral Augmentin to complete a 1-week course of     antibiotic therapy. 3. Dysphagia.  The patient was noted to have a dysphagia on admission.     Speech Therapy was consulted.  The patient was seen by Speech     Therapy as well as placed on dysphagia diet, which the patient is     currently tolerating.  Rest of the patient's issues have remained     stable.  It has been a pleasure taking care of Ms. Sheryle Spray.     Ramiro Harvest, MD   ______________________________ [facility]    DT/MEDQ  D:  04/29/2010  T:  04/29/2010  Job:  [id number]  Electronically Signed by Ramiro Harvest MD on 05/22/2010 08:09:47 PM

## 2010-05-22 NOTE — Discharge Summary (Signed)
[name], [name]               ACCOUNT NO.:  [id number]  MEDICAL RECORD NO.:  [id number]           PATIENT TYPE:  I  LOCATION:  3008                         FACILITY:  [facility]  PHYSICIAN:  Pleas Koch, MD        DATE OF BIRTH:  [birth year]  DATE OF ADMISSION:  04/23/2010 DATE OF DISCHARGE:                              DISCHARGE SUMMARY   ADDENDUM: Progress note number [id number].  Please see note by Dr. Janee Morn dated that date.  This note covers hospital course from April 30, 2010 to May 06, 2010.  DISCHARGE MEDICATIONS: 1. Cozaar 100 mg p.o. 1 tablet at bedtime. 2. Patanol 1 drop at bedtime. 3. Depakote 500 mg 1 tablet b.i.d. 4. Keppra 1000 mg p.o. 2 tablets b.i.d. 5. Cymbalta 1 capsule q.a.m. 6. Labetalol 1 tablet b.i.d. 7. MiraLax 17 grams q.a.m. 8. Sinemet 1 tablet t.i.d. 9. Norvasc 5 mg 1 tablet q.a.m. 10.Aspirin 81 mg 1 tablet q.a.m. 11.Prilosec 20 mg 1 capsule q.a.m. 12.Synthroid 50 mcg 1 tablet q.a.m. 13.Vitamin D3 of 1000 units 3 tablets q.a.m.  Medications that have been discontinued are, 1. Dilantin. 2. Vimpat/lacosamide. 3. Risperdal.  New medications are. 1. Ensure Chocolate Pudding 113 with meals daily. 2. Nutritional supplement, Resource 240 mg b.i.d. with meals.  PROCEDURES PERFORMED:  Pertinent imaging studies done this time are EEG done April 24, 2010, showed abnormal study due to moderate bilateral cerebral dysfunction, diffuse background slowing, no evidence of epileptiform discharge.  Etiology likely metabolic/toxic and that was done April 24, 2010.  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  The patient only opened eyes to noxious stimulus, but fell back to sleep and as such most history was obtained by nursing home records and ED records.  Per nursing home and ED, she has had decreased level of consciousness for the past 2 to 3 days, however, it was thought she was more alert before in the past, but baseline had been deemed to be more convulsant.  Brought to the ED secondary worsening level of consciousness.  Urinalysis done showed nitrites, moderate leukocytes, 7- 10 wbc's.  UDS, which was negative.  Head CT was negative.  Chest x-ray was negative.  BMET was negative.  CBC was normal.  EKG showed first- degree heart block.  Alcohol level 5, lactic acid of 1.4, Depakote level of 58, Dilantin level of 36.4 and the patient was called for admission.  For rest of admission history, please see H and P of job number [id number].  HOSPITAL COURSE: 1. Metabolic encephalopathy - unclear etiology.  The patient was very     lethargic.  ABG on admission showed normal pH and elevated bicarb     and PCO2, which is likely compensation  secondary to sleep apnea.     CT was done, which was negative.  MRI was unable to be done     secondary to unknown clips in the patient's aneurysm.  CT angio was     done on April 24, 2010 with results as per prior dictations.     EEG showed toxic metabolic encephalopathy.  The patient was seen by     Neurology, Dr. Vickey Huger on April 23, 2010, and pharmacy was     asked to help with Dilantin dosage.  It is noted that the patient's     Dilantin level subsequently dropped, but corrected level would have     been higher given the fact that she was hypoalbuminemic and the     corrected Dilantin level was 38.8.  Her Dilantin level today now     14.8, which would be within therapeutic range.  The patient still     remains  altered.  The patient's transitioned to the [facility] for review as it was thought that this may likely be multifactorial.  She had repeat ABGs, which confirmed likely her sleep apnea state.  Pulmonology did not feel that this was definitely the case and seen by Dr. Delford Field as well as by Dr. Sherene Sires of Pulmonology and May 05, 2010, it was noted that the patient did not want to discuss goals of care.  The patient may need eventually type of goals of care meeting with providers.  I have discussed extensively with the sister that we have not really found specific cause for her deterioration and ultimately she will likely need to have follow up with nursing home physician to determine this.  I have in any case discontinued her Vimpat as well as her Dilantin in the hope that she will awaken; however, there is likely that she may have breakthrough seizures.  Neurology had recommended keeping on Keppra, Vimpat, and Depakote. 1. Obstructive sleep apnea.  She will continue on her settings of 14/6     at nursing facility.  I would recommend keeping on this whenever     she is asleep.  She does experience some amounts of wakefulness     during the days, but during days, I expect this is very episodic     and not constant.  I would not recommend further aggressive course     of care as detailed above. 2. Moderate hypertension.  Her blood pressures have been 101-99 over     58-60 and she will be discharged home on similar medications of her     Cozaar.  This may need to be down titrated if this is constant. 3. Dysphagia.  She can continue full meals when she is awake.  Speech     Therapy has seen her and has recommended dysphagia type II diet     with thin liquids, which she should continue. 4. The patient had transient thrombocytopenia, which was noted on     May 04, 2010.  She has not been on heparinoids.  I would     recommend further blood count just to follow up on this. 5. She has  anemia of chronic disease and her last blood count was 10,     hemoglobin is 10.6, hematocrit was 34.0. 6. She was in first-degree AV block, which is not thought to be     contributory at this time.  PLAN:  As an outpatient, the patient will need to be readdressed with family and with next  of kin regarding course of further care as it does not seem that she is able at this point to discuss this with me and she got angry when prior physician saw her when she was more awake.          ______________________________ Pleas Koch, MD     JS/MEDQ  D:  05/06/2010  T:  05/06/2010  Job:  [id number]  Electronically Signed by Pleas Koch MD on 05/22/2010 04:39:21 PM

## 2010-05-22 NOTE — H&P (Signed)
[name], [name]               ACCOUNT NO.:  [id number]  MEDICAL RECORD NO.:  [id number]           PATIENT TYPE:  E  LOCATION:  [facility]                         FACILITY:  [facility]  PHYSICIAN:  Ramiro Harvest, MD    DATE OF BIRTH:  [birth year]  DATE OF ADMISSION:  04/23/2010 DATE OF DISCHARGE:                             HISTORY & PHYSICAL   PRIMARY CARE PHYSICIAN:  Maxwell Caul, MD  The patient's pain doctor is Dr. Jason Coop of [facility].  HISTORY OF PRESENT ILLNESS:  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  The patient opens eyes only to noxious stimulus, but falls back to sleep and as such most of the history is obtained from nursing home records and the ED records.  Per nursing home and ED records, the patient with decreased level of consciousness over the past 2-3 days at the nursing home where it was felt she was more alert before in the past but her baseline has been deemed to be more conversant per records.  The patient was subsequently brought to the ED secondary to worsening level of consciousness. Urinalysis which was done in the ED was negative for nitrites and moderate leukocytosis with 7-10 wbc's.  Urine drug screen which was done was negative.  Head CT which was done was negative for any acute findings.  Chest x-ray which was done was also negative for any acute findings.  BMET obtained was within normal limits.  CBC  which was obtained was within normal limits.  EKG had a normal sinus rhythm with a first-degree AV block.  Alcohol level was less than 5.  UDS was negative.  Lactic acid was 1.4.  Depakote level of 58.  Dilantin level of 26.4.  We were called to admit the patient for further evaluation and management.  ALLERGIES:  CODEINE per records from e-chart.  PAST MEDICAL HISTORY:  This was obtained from records from e-chart. 1. History of hypercarbic respiratory failure requiring mechanical     ventilation in November 2011. 2. Severe central sleep apnea syndrome per sleep study of February     2012. 3. History of a seizure disorder secondary to CVA. 4. Chronic spasticity being treated as Parkinson's. 5. Diet-controlled type 2 diabetes. 6. Left-sided hemiparesis. 7. Nonambulatory secondary to CVA. 8. Status post craniotomy with a clipping of aneurysm in 1999. 9. Chronic neuropathic pain treated with fentanyl and baclofen pump. 10.Hypertension. 11.Hypothyroidism. 12.History of thromboembolic CVA in 1999. 13.History of hemorrhagic CVA in April 2008. 14.Obesity. 15.Chronic depression. 16.History of psychosis. 17.History of behavioral issues with a poor judgment. 18.History of frequent apneic episodes. 19.Status post tracheostomy and gastrostomy tube in May 2008.  20.Prior history of Depakote related thrombocytopenia in the past.  HOME MEDICATIONS:  From the nursing home records has showed that the patient is on: 1. Cozaar 100 mg p.o. daily. 2. Norvasc 5 mg p.o. daily. 3. Aspirin 81 mg p.o. daily. 4. Cymbalta 60 mg p.o. daily. 5. Synthroid 50 mcg p.o. daily. 6. Prilosec 20 mg p.o. daily. 7. Vitamin D3 1000 units 3 tablets p.o. daily. 8. MiraLax 3350, 8 ounces of liquid in the mouth daily. 9. Depakote 500 mg p.o. b.i.d. 10.Labetalol 200 mg p.o. b.i.d. 11.Keppra 1000 mg 2 tablets p.o. b.i.d. 12.Vimpat 50 mg p.o. b.i.d. 13.Sinemet 25/100 one tablet p.o. t.i.d. 14.Dilantin 300 mg p.o. at  bedtime. 15.Patanol 0.1% to the eye 1 drop to each eye at bedtime. 16.BiPAP at bedtime and off in the morning. 17.Selenium sulfide shampoo to scalp 3 times a week. 18.Baclofen and fentanyl intrathecal pump. 19.Fleet enema p.r.n. 20.Albuterol nebs, 0.083 nebs q.3 h p.r.n. 21.Tylenol 650 mg p.o. q.6 h p.r.n. 22.Artificial tears 2 drops to each eye 3 times daily p.r.n. 23.Enulose 30 mL p.o. daily p.r.n. 24.Milk of magnesia 2 teaspoons 30 mL p.o. daily p.r.n. 25.Clonidine 0.1 mg p.o. q.6 h p.r.n. 26.Risperdal 0.25 mg p.o. at bedtime. 27.Nystatin swish and swallow 5 mL q.i.d. x7 days, which seems to have     been completed.  SOCIAL HISTORY:  Obtained from e-chart.  Mother with a history of hypertension and diabetes.  Father with a history of hypertension and diabetes.  REVIEW OF SYSTEMS:  Unobtainable due to the patient's current state.  PHYSICAL EXAMINATION:  VITAL SIGNS:  Temperature 98.5, blood pressure 117/83, pulse of 68, respirations 12, satting 99% on 2 liters nasal cannula. GENERAL:  The patient is arousable to noxious stimuli however falls right back to sleep and not following commands. HEENT:  Normocephalic and atraumatic.  Pupils equal, round, and reactive to light and accommodation.  The patient does not open her mouth. NECK:  Supple.  No lymphadenopathy. RESPIRATORY:  Lungs are clear to auscultation bilaterally in anterior lung fields.  CARDIOVASCULAR:  Regular rate and rhythm.  No murmurs, rubs, or gallops. ABDOMEN:  Soft, nontender, nondistended.  Positive bowel sounds. EXTREMITIES:  No clubbing, cyanosis, or edema.  The patient with contractures in the right upper extremity. NEUROLOGIC:  The patient is responsive to noxious stimulus; however, is not following commands and as such unable to perform the rest of the neurological exam secondary to current decreased level of consciousness.  ADMISSION LABS:  UDS was negative.  UA was yellow, cloudy, specific gravity  1.030, pH of 6.  Urine glucose negative, bilirubin small, ketones 15, blood moderate, protein negative, urobilinogen 1.0, nitrite negative, leukocytes moderate.  Urine microscopy wbc's 7-10, rbc's 3-6. UDS was negative.  Chest x-ray did show no acute findings.  CT of the head without contrast showed no acute findings, stable compared to prior exam.  EKG with normal sinus rhythm with first-degree AV block.  BMET with a sodium of 140, potassium 3.5, chloride 101, bicarb 30, BUN 15, creatinine 0.55, glucose of 96, calcium of 8.3.  Alcohol level less than 5.  Ammonia level 35, lactic acid 1.4.  PTT 6, PT of 14.5, INR 1.11.  ASSESSMENT AND PLAN:  [name] is a 55 year old lady with history of hemorrhagic cerebrovascular accident in the past, history of status post craniotomy with clipping of aneurysm, history of seizure disorder secondary to a history of cerebrovascular accident, nonambulatory, history of a left-sided hemiparesis, history of severe central sleep apnea syndrome presented  to the ED with a 2-to 3-day history of a decreased level of consciousness.  1. Altered mental status/encephalopathy, questionable etiology.  The     patient only responding to noxious stimuli.  Differential includes     neurologic versus metabolic encephalopathy versus drug induced.  We     will admit the patient.  We will check an ABG.  We will check an     MRI of the head.  We will also check an EEG.  Urine cultures are     pending.  CBC is within normal limits.  UDS is negative.  BMET     within normal limits.  We will check a CMET.  We will place on IV     Rocephin.  Dilantin level is 26.4.  Depakote level is 58.  We will     check a CMET.  We will check an albumin level.  We will hold all     oral medications for now.  We will follow and monitor and we will     consult with Neurology for further evaluation and recommendations. 2. History of seizure disorder.  Depakote level of 58.  Dilantin  level     of 26.4.  We will check an albumin level.  We will place on seizure     precautions and follow.  We will also check an EEG. 3. Severe central sleep apnea syndrome.  We will check an ABG.  We     will place on bilevel positive airway pressure at bedtime. 4. Hypothyroidism.  We check a TSH and place on Synthroid. 5. Type 2 diabetes.  Check CBGs q.4 h while n.p.o. and follow. 6. History of hemorrhagic cerebrovascular accident/status post     craniotomy and clipping of aneurysm in 1999. 7. Chronic spasticity treated as Parkinson's. 8. Chronic neuropathic pain on baclofen and fentanyl pump. 9. Probable urinary tract infection.  Urine cultures are pending.  We     will place on IV Rocephin for now. 10.Hypertension.  We will place on Lopressor IV. 11.Gastroesophageal reflux disease.  We will place on proton pump     inhibitor. 12.Prophylaxis.  Sequential compression devices for deep venous     thrombosis prophylaxis and proton pump inhibitor for     gastrointestinal prophylaxis.  It has been a pleasure taking care of Ms. Sheryle Spray.     Ramiro Harvest, MD     DT/MEDQ  D:  04/23/2010  T:  04/23/2010  Job:  [id number]  cc:   Maxwell Caul, M.D. [place] Dr. Windle Guard Kofi A. Gerilyn Pilgrim, M.D.  Electronically Signed by Ramiro Harvest MD on 05/22/2010 08:09:37 PM

## 2010-05-25 LAB — GLUCOSE, CAPILLARY: Glucose-Capillary: 103 mg/dL — ABNORMAL HIGH (ref 70–99)

## 2010-05-25 LAB — CBC
Platelets: 197 10*3/uL (ref 150–400)
WBC: 8.3 10*3/uL (ref 4.0–10.5)

## 2010-05-25 LAB — BASIC METABOLIC PANEL
BUN: 7 mg/dL (ref 6–23)
Calcium: 8.8 mg/dL (ref 8.4–10.5)
Creatinine, Ser: 1.26 mg/dL — ABNORMAL HIGH (ref 0.4–1.2)
GFR calc non Af Amer: 44 mL/min — ABNORMAL LOW (ref >60)
Potassium: 3.5 mEq/L (ref 3.5–5.1)

## 2010-05-25 LAB — DIFFERENTIAL
Basophils Absolute: 0 10*3/uL (ref 0.0–0.1)
Lymphocytes Relative: 32 % (ref 12–46)
Lymphs Abs: 2.6 10*3/uL (ref 0.7–4.0)
Neutro Abs: 4.7 10*3/uL (ref 1.7–7.7)
Neutrophils Relative %: 56 % (ref 43–77)

## 2010-05-26 LAB — DIFFERENTIAL
Basophils Absolute: 0 10*3/uL (ref 0.0–0.1)
Basophils Absolute: 0 10*3/uL (ref 0.0–0.1)
Basophils Absolute: 0 10*3/uL (ref 0.0–0.1)
Basophils Absolute: 0 10*3/uL (ref 0.0–0.1)
Basophils Absolute: 0.1 10*3/uL (ref 0.0–0.1)
Basophils Absolute: 0 10*3/uL (ref 0.0–0.1)
Basophils Relative: 0 % (ref 0–1)
Basophils Relative: 0 % (ref 0–1)
Basophils Relative: 0 % (ref 0–1)
Basophils Relative: 1 % (ref 0–1)
Eosinophils Absolute: 0 10*3/uL (ref 0.0–0.7)
Eosinophils Absolute: 0 10*3/uL (ref 0.0–0.7)
Eosinophils Absolute: 0.2 10*3/uL (ref 0.0–0.7)
Eosinophils Absolute: 0.3 10*3/uL (ref 0.0–0.7)
Eosinophils Relative: 0 % (ref 0–5)
Eosinophils Relative: 1 % (ref 0–5)
Eosinophils Relative: 2 % (ref 0–5)
Eosinophils Relative: 3 % (ref 0–5)
Eosinophils Relative: 0 % (ref 0–5)
Lymphocytes Relative: 20 % (ref 12–46)
Lymphocytes Relative: 27 % (ref 12–46)
Lymphocytes Relative: 32 % (ref 12–46)
Lymphocytes Relative: 48 % — ABNORMAL HIGH (ref 12–46)
Lymphocytes Relative: 55 % — ABNORMAL HIGH (ref 12–46)
Lymphocytes Relative: 27 % (ref 12–46)
Lymphs Abs: 2.2 10*3/uL (ref 0.7–4.0)
Lymphs Abs: 2.5 10*3/uL (ref 0.7–4.0)
Lymphs Abs: 2.5 10*3/uL (ref 0.7–4.0)
Lymphs Abs: 2.5 10*3/uL (ref 0.7–4.0)
Lymphs Abs: 2.9 10*3/uL (ref 0.7–4.0)
Lymphs Abs: 3.7 10*3/uL (ref 0.7–4.0)
Lymphs Abs: 2 10*3/uL (ref 0.7–4.0)
Monocytes Absolute: 0.4 10*3/uL (ref 0.1–1.0)
Monocytes Absolute: 0.6 10*3/uL (ref 0.1–1.0)
Monocytes Absolute: 1.1 10*3/uL — ABNORMAL HIGH (ref 0.1–1.0)
Monocytes Absolute: 1.7 10*3/uL — ABNORMAL HIGH (ref 0.1–1.0)
Monocytes Absolute: 1.7 10*3/uL — ABNORMAL HIGH (ref 0.1–1.0)
Monocytes Relative: 10 % (ref 3–12)
Monocytes Relative: 11 % (ref 3–12)
Monocytes Relative: 12 % (ref 3–12)
Monocytes Relative: 18 % — ABNORMAL HIGH (ref 3–12)
Monocytes Relative: 19 % — ABNORMAL HIGH (ref 3–12)
Neutro Abs: 1.3 10*3/uL — ABNORMAL LOW (ref 1.7–7.7)
Neutro Abs: 2.1 10*3/uL (ref 1.7–7.7)
Neutro Abs: 5.8 10*3/uL (ref 1.7–7.7)
Neutro Abs: 5.8 10*3/uL (ref 1.7–7.7)
Neutro Abs: 7.5 10*3/uL (ref 1.7–7.7)
Neutro Abs: 4.6 10*3/uL (ref 1.7–7.7)
Neutrophils Relative %: 48 % (ref 43–77)
Neutrophils Relative %: 48 % (ref 43–77)
Neutrophils Relative %: 57 % (ref 43–77)
Neutrophils Relative %: 62 % (ref 43–77)
Neutrophils Relative %: 71 % (ref 43–77)
Neutrophils Relative %: 62 % (ref 43–77)
Smear Review: DECREASED

## 2010-05-26 LAB — BASIC METABOLIC PANEL
BUN: 6 mg/dL (ref 6–23)
BUN: 6 mg/dL (ref 6–23)
CO2: 28 mEq/L (ref 19–32)
CO2: 29 mEq/L (ref 19–32)
Calcium: 7.7 mg/dL — ABNORMAL LOW (ref 8.4–10.5)
Calcium: 8 mg/dL — ABNORMAL LOW (ref 8.4–10.5)
Calcium: 8.6 mg/dL (ref 8.4–10.5)
Calcium: 9 mg/dL (ref 8.4–10.5)
Calcium: 9.3 mg/dL (ref 8.4–10.5)
Chloride: 105 mEq/L (ref 96–112)
Chloride: 107 mEq/L (ref 96–112)
Creatinine, Ser: 0.59 mg/dL (ref 0.4–1.2)
Creatinine, Ser: 0.99 mg/dL (ref 0.4–1.2)
Creatinine, Ser: 1.35 mg/dL — ABNORMAL HIGH (ref 0.4–1.2)
GFR calc Af Amer: >60 mL/min (ref >60)
GFR calc Af Amer: >60 mL/min (ref >60)
GFR calc non Af Amer: 42 mL/min — ABNORMAL LOW (ref >60)
GFR calc non Af Amer: >60 mL/min (ref >60)
GFR calc non Af Amer: >60 mL/min (ref >60)
Glucose, Bld: 116 mg/dL — ABNORMAL HIGH (ref 70–99)
Glucose, Bld: 86 mg/dL (ref 70–99)
Glucose, Bld: 98 mg/dL (ref 70–99)
Potassium: 3.4 mEq/L — ABNORMAL LOW (ref 3.5–5.1)
Potassium: 3.7 mEq/L (ref 3.5–5.1)
Potassium: 3.9 mEq/L (ref 3.5–5.1)
Potassium: 4.3 mEq/L (ref 3.5–5.1)
Sodium: 137 mEq/L (ref 135–145)
Sodium: 141 mEq/L (ref 135–145)
Sodium: 141 mEq/L (ref 135–145)
Sodium: 142 mEq/L (ref 135–145)

## 2010-05-26 LAB — GLUCOSE, CAPILLARY
Glucose-Capillary: 100 mg/dL — ABNORMAL HIGH (ref 70–99)
Glucose-Capillary: 102 mg/dL — ABNORMAL HIGH (ref 70–99)
Glucose-Capillary: 122 mg/dL — ABNORMAL HIGH (ref 70–99)
Glucose-Capillary: 123 mg/dL — ABNORMAL HIGH (ref 70–99)
Glucose-Capillary: 127 mg/dL — ABNORMAL HIGH (ref 70–99)
Glucose-Capillary: 131 mg/dL — ABNORMAL HIGH (ref 70–99)
Glucose-Capillary: 132 mg/dL — ABNORMAL HIGH (ref 70–99)
Glucose-Capillary: 136 mg/dL — ABNORMAL HIGH (ref 70–99)
Glucose-Capillary: 138 mg/dL — ABNORMAL HIGH (ref 70–99)
Glucose-Capillary: 158 mg/dL — ABNORMAL HIGH (ref 70–99)
Glucose-Capillary: 74 mg/dL (ref 70–99)
Glucose-Capillary: 79 mg/dL (ref 70–99)
Glucose-Capillary: 81 mg/dL (ref 70–99)
Glucose-Capillary: 92 mg/dL (ref 70–99)
Glucose-Capillary: 95 mg/dL (ref 70–99)
Glucose-Capillary: 97 mg/dL (ref 70–99)

## 2010-05-26 LAB — URINALYSIS, ROUTINE W REFLEX MICROSCOPIC
Bilirubin Urine: NEGATIVE
Ketones, ur: NEGATIVE mg/dL
Nitrite: NEGATIVE
Nitrite: NEGATIVE
Nitrite: NEGATIVE
Protein, ur: NEGATIVE mg/dL
Specific Gravity, Urine: 1.02 (ref 1.005–1.030)
Specific Gravity, Urine: 1.03 (ref 1.005–1.030)
Urobilinogen, UA: 0.2 mg/dL (ref 0.0–1.0)
Urobilinogen, UA: 1 mg/dL (ref 0.0–1.0)
Urobilinogen, UA: 1 mg/dL (ref 0.0–1.0)
pH: 5 (ref 5.0–8.0)

## 2010-05-26 LAB — VALPROIC ACID LEVEL
Valproic Acid Lvl: 78.7 ug/mL (ref 50.0–100.0)
Valproic Acid Lvl: <10 ug/mL — ABNORMAL LOW (ref 50.0–100.0)
Valproic Acid Lvl: 101 ug/mL — ABNORMAL HIGH (ref 50.0–100.0)

## 2010-05-26 LAB — CBC
HCT: 28.9 % — ABNORMAL LOW (ref 36.0–46.0)
HCT: 29.9 % — ABNORMAL LOW (ref 36.0–46.0)
HCT: 31.5 % — ABNORMAL LOW (ref 36.0–46.0)
HCT: 32.3 % — ABNORMAL LOW (ref 36.0–46.0)
HCT: 34.7 % — ABNORMAL LOW (ref 36.0–46.0)
HCT: 35.1 % — ABNORMAL LOW (ref 36.0–46.0)
HCT: 37 % (ref 36.0–46.0)
Hemoglobin: 10.2 g/dL — ABNORMAL LOW (ref 12.0–15.0)
Hemoglobin: 10.3 g/dL — ABNORMAL LOW (ref 12.0–15.0)
Hemoglobin: 10.8 g/dL — ABNORMAL LOW (ref 12.0–15.0)
Hemoglobin: 11.4 g/dL — ABNORMAL LOW (ref 12.0–15.0)
Hemoglobin: 11.6 g/dL — ABNORMAL LOW (ref 12.0–15.0)
Hemoglobin: 9.4 g/dL — ABNORMAL LOW (ref 12.0–15.0)
MCHC: 32.7 g/dL (ref 30.0–36.0)
MCHC: 33 g/dL (ref 30.0–36.0)
MCHC: 33 g/dL (ref 30.0–36.0)
MCHC: 33.2 g/dL (ref 30.0–36.0)
MCHC: 33.3 g/dL (ref 30.0–36.0)
MCHC: 33.3 g/dL (ref 30.0–36.0)
MCHC: 32.5 g/dL (ref 30.0–36.0)
MCV: 93.9 fL (ref 78.0–100.0)
MCV: 94.4 fL (ref 78.0–100.0)
MCV: 95.4 fL (ref 78.0–100.0)
MCV: 96.1 fL (ref 78.0–100.0)
Platelets: 168 10*3/uL (ref 150–400)
Platelets: 187 10*3/uL (ref 150–400)
Platelets: 228 10*3/uL (ref 150–400)
Platelets: 44 10*3/uL — ABNORMAL LOW (ref 150–400)
RBC: 3.03 MIL/uL — ABNORMAL LOW (ref 3.87–5.11)
RBC: 3.16 MIL/uL — ABNORMAL LOW (ref 3.87–5.11)
RBC: 3.19 MIL/uL — ABNORMAL LOW (ref 3.87–5.11)
RBC: 3.27 MIL/uL — ABNORMAL LOW (ref 3.87–5.11)
RBC: 3.42 MIL/uL — ABNORMAL LOW (ref 3.87–5.11)
RBC: 3.85 MIL/uL — ABNORMAL LOW (ref 3.87–5.11)
RDW: 16.1 % — ABNORMAL HIGH (ref 11.5–15.5)
RDW: 16.2 % — ABNORMAL HIGH (ref 11.5–15.5)
RDW: 16.4 % — ABNORMAL HIGH (ref 11.5–15.5)
RDW: 16.6 % — ABNORMAL HIGH (ref 11.5–15.5)
WBC: 12 10*3/uL — ABNORMAL HIGH (ref 4.0–10.5)
WBC: 4.1 10*3/uL (ref 4.0–10.5)
WBC: 5.3 10*3/uL (ref 4.0–10.5)
WBC: 8.4 10*3/uL (ref 4.0–10.5)
WBC: 9.4 10*3/uL (ref 4.0–10.5)
WBC: 7.4 10*3/uL (ref 4.0–10.5)

## 2010-05-26 LAB — COMPREHENSIVE METABOLIC PANEL
ALT: 17 U/L (ref 0–35)
AST: 23 U/L (ref 0–37)
Albumin: 2.9 g/dL — ABNORMAL LOW (ref 3.5–5.2)
Alkaline Phosphatase: 37 U/L — ABNORMAL LOW (ref 39–117)
BUN: 10 mg/dL (ref 6–23)
BUN: 34 mg/dL — ABNORMAL HIGH (ref 6–23)
CO2: 29 mEq/L (ref 19–32)
CO2: 28 mEq/L (ref 19–32)
Calcium: 8.7 mg/dL (ref 8.4–10.5)
Calcium: 9.1 mg/dL (ref 8.4–10.5)
Chloride: 103 mEq/L (ref 96–112)
Chloride: 103 mEq/L (ref 96–112)
Creatinine, Ser: 0.62 mg/dL (ref 0.4–1.2)
Creatinine, Ser: 2.22 mg/dL — ABNORMAL HIGH (ref 0.4–1.2)
GFR calc Af Amer: >60 mL/min (ref >60)
GFR calc non Af Amer: >60 mL/min (ref >60)
GFR calc non Af Amer: 23 mL/min — ABNORMAL LOW (ref >60)
Glucose, Bld: 115 mg/dL — ABNORMAL HIGH (ref 70–99)
Glucose, Bld: 113 mg/dL — ABNORMAL HIGH (ref 70–99)
Potassium: 3.2 mEq/L — ABNORMAL LOW (ref 3.5–5.1)
Sodium: 138 mEq/L (ref 135–145)
Total Bilirubin: 0.9 mg/dL (ref 0.3–1.2)
Total Bilirubin: 0.7 mg/dL (ref 0.3–1.2)
Total Protein: 6 g/dL (ref 6.0–8.3)

## 2010-05-26 LAB — CK TOTAL AND CKMB (NOT AT ARMC)
CK, MB: 2.4 ng/mL (ref 0.3–4.0)
Relative Index: 2.3 (ref 0.0–2.5)
Total CK: 105 U/L (ref 7–177)

## 2010-05-26 LAB — CARDIAC PANEL(CRET KIN+CKTOT+MB+TROPI)
CK, MB: 2.5 ng/mL (ref 0.3–4.0)
CK, MB: 2.5 ng/mL (ref 0.3–4.0)
Relative Index: INVALID (ref 0.0–2.5)
Relative Index: INVALID (ref 0.0–2.5)
Total CK: 83 U/L (ref 7–177)
Total CK: 94 U/L (ref 7–177)
Troponin I: 0.02 ng/mL (ref 0.00–0.06)

## 2010-05-26 LAB — LACTIC ACID, PLASMA: Lactic Acid, Venous: 1.6 mmol/L (ref 0.5–2.2)

## 2010-05-26 LAB — BLOOD GAS, ARTERIAL
Acid-Base Excess: 0.8 mmol/L (ref 0.0–2.0)
TCO2: 24.9 mmol/L (ref 0–100)
pCO2 arterial: 55.6 mmHg — ABNORMAL HIGH (ref 35.0–45.0)

## 2010-05-26 LAB — POCT CARDIAC MARKERS: Myoglobin, poc: 165 ng/mL (ref 12–200)

## 2010-05-26 LAB — CULTURE, BLOOD (ROUTINE X 2)
Culture: NO GROWTH
Report Status: 6102011
Report Status: 6052011

## 2010-05-26 LAB — URINE CULTURE
Colony Count: NO GROWTH
Colony Count: NO GROWTH

## 2010-05-26 LAB — PROTIME-INR: Prothrombin Time: 13.9 seconds (ref 11.6–15.2)

## 2010-05-26 LAB — TROPONIN I: Troponin I: 0.02 ng/mL (ref 0.00–0.06)

## 2010-05-26 LAB — PROCALCITONIN: Procalcitonin: 1.99 ng/mL

## 2010-05-26 LAB — D-DIMER, QUANTITATIVE
D-Dimer, Quant: 0.71 ug/mL-FEU — ABNORMAL HIGH (ref 0.00–0.48)
D-Dimer, Quant: 1.95 ug/mL-FEU — ABNORMAL HIGH (ref 0.00–0.48)

## 2010-05-26 LAB — AMMONIA: Ammonia: 20 umol/L (ref 11–35)

## 2010-05-26 LAB — URINE MICROSCOPIC-ADD ON

## 2010-05-26 LAB — MAGNESIUM: Magnesium: 1.5 mg/dL (ref 1.5–2.5)

## 2010-05-26 LAB — VANCOMYCIN, TROUGH: Vancomycin Tr: 17.9 ug/mL (ref 10.0–20.0)

## 2010-06-05 NOTE — Consult Note (Signed)
[name], [name]               ACCOUNT NO.:  [id number]  MEDICAL RECORD NO.:  [id number]           PATIENT TYPE:  I  LOCATION:  4740                         FACILITY:  [facility]  PHYSICIAN:  Melvyn Novas, M.D.  DATE OF BIRTH:  1955-06-12  DATE OF CONSULTATION:  04/23/2010 DATE OF DISCHARGE:                                CONSULTATION   Time of consultation is 9:35.  PRIMARY PHYSICIAN:  Maxwell Caul, MD  CONSULTING PHYSICIAN:  [facility].  CHIEF COMPLAINT:  Altered mental status.  HISTORY OF PRESENT ILLNESS:  The patient is a 55 year old female with known history of seizure disorder, hemorrhagic CVAs, psychosis, and central sleep apnea with a prior history of respiratory failure, who resides in nursing home facility and presents with altered mental status.  The patient was sent to the emergency room by nursing home staff who observed that the patient was less talkative and more drowsy than her normal self.  According to the nursing home report, this has been going on for about a day, which has worsened this morning and prompted her visit to emergency room.  No family members are present at the bedside, so further information could not be obtained.  PAST MEDICAL HISTORY: 1. Hemorrhagic CVAs. 2. Hypertension. 3. Diabetes. 4. Seizure disorder. 5. Central sleep apnea with respiratory failure. 6. Behavioral issue with poor judgment, possibly secondary to seizure     issues as well as psychosis. 7. Obesity. 8. Chronic spasticity. 9. Prior history of intrathecal pump with fentanyl and baclofen for     pain control.  She is a full code.  MEDICATIONS: 1. Albuterol nebulizer inhaled every three hours. 2. Diovan 320 mg one tablet p.o. q.p.m. 3. Vimpat 50 mg p.o. b.i.d. 4. Dilantin 100 mg capsules three capsules at bedtime. 5. Risperdal 0.25 mg p.o. b.i.d. 6. Depakote 500 mg one tablet p.o. b.i.d. 7. Tylenol over-the-counter 325 mg two tablets p.o. q.6 h.  p.r.n. for     pain and headache. 8. Norvasc 5 mg p.o. daily. 9. Artificial tears two drops in both eyes three times as needed. 10.Enteric-coated aspirin 81 mg p.o. daily. 11.Baclofen and fentanyl via intrathecal pump, followed by Dr. Jason Coop at [facility] in [facility]. 12.Cymbalta 60 mg 1 tablet daily. 13.Sinemet 25/100 one tablet p.o. t.i.d. 14.Labetalol 200 mg p.o. b.i.d. 15.Keppra 500 mg four tablets p.o. b.i.d. 16.Synthroid 50 mcg one tablet p.o. daily. 17.Lactulose 30 mL as needed for constipation. 18.Milk of magnesia two tablets as needed for constipation. 19.Patanol eye drops 1 drop both eyes daily at bedtime. 20.Prilosec 20 mg p.o. daily. 21.Vitamin D3, 1000 units 3 tablets p.o. daily.  FAMILY HISTORY:  Not contributory to presentation today.  SOCIAL HISTORY:  The patient lives in a nursing home, not a known smoker or alcohol taker or illicit drug user.  REVIEW OF SYMPTOMS:  As per HPI.  PHYSICAL EXAMINATION:  VITAL SIGNS:  Blood pressure is 150/98, pulse is 63, respiratory rate of 20, temperature is 98.5. CENTRAL NERVOUS SYSTEM:  The patient is drowsy, not following commands. The patient is awakened with painful stimulation and sternal  rub. CRANIAL NERVES:  Eyes, pupils are bilaterally dilated about 5-7 mm, sluggish on pupillary reaction from light.  Extraocular movements could not be examined.  No nystagmus at baseline.  Face is symmetric.  The patient does not verbalize at this point of time and not able to follow any commands, therefore, further neurological exam could not be obtained.  The patient's deep tendon reflexes are +2 on all extremities. PULMONARY EXAM:  There is a wheezing and crackles at the base. CARDIOVASCULAR EXAM:  Regular rate and rhythm, S1 and S2 is normal.  No murmurs.  There is peripheral edema and spasticity. ABDOMEN:  Soft, nontender based on the facial expression, no hepatosplenomegaly.  LABORATORY DATA:  Sodium is  140, potassium is 3.5, chloride is 101, bicarb is 30, BUN is 15, creatinine is 0.55, glucose is 96.  Hemoglobin is 12.1, white count is 5.2, platelet of 146,000.  Ammonia is 35.  UDS is negative.  UA shows white count of 7-10, rbc's are 3-6 per HPF. Depakote level is 58.  Dilantin level is 26.4, which based on albumin of 2.9 on March 12, 2010 would correct to 38.8.  Albumin levels for today are pending.  CT head is negative for new strokes.  Chest x-ray does not show any acute finding.  ASSESSMENT AND PLAN:  The patient is a 55 year old nursing home resident with multiple medical problems including prior hemorrhagic cerebrovascular accidents, seizure disorder, central apnea, and prior respiratory failure, who presents with altered mental status.  The patient's corrected phenytoin levels are likely to be in toxic range and CT scan is negative for hemorrhagic stroke at this time.  The patient has a urinary tract infection, but no evidence of sepsis.  Therefore, clinically the patient's altered mental status seems to be correlated with phenytoin toxicity.  The patient's ABGs are concerning for elevated pCO2s, which were seen in the past.  With altered mental status, this remains concerning for impeding respiratory failure.  The patient would need close monitoring for respiratory failure along with hydration, check of TSH, checking free Dilantin level.  We will continue to follow Dilantin levels, which should correlate with clinical improvement.  The patient would need to follow up with our outpatient neurologist after the discharge.  We will continue to following the patient.  The patient's Dilantin dose would likely need to be altered with chewable tablets 1 tablet of 100 mg in the morning and 1.5 tablets in the evening at the time of discharge.     Clerance Lav, MD PhD   ______________________________ Melvyn Novas, M.D.    RS/MEDQ  D:  04/23/2010  T:  04/24/2010  Job:   [id number]  Electronically Signed by Clerance Lav MD PHD on 04/28/2010 01:32:40 PM Electronically Signed by Melvyn Novas M.D. on 06/05/2010 12:57:30 PM

## 2010-06-11 LAB — VALPROIC ACID LEVEL: Valproic Acid Lvl: 37.9 ug/mL — ABNORMAL LOW (ref 50.0–100.0)

## 2010-06-18 LAB — URINE CULTURE: Culture: NO GROWTH

## 2010-06-18 LAB — URINE MICROSCOPIC-ADD ON

## 2010-06-18 LAB — URINALYSIS, ROUTINE W REFLEX MICROSCOPIC
Bilirubin Urine: NEGATIVE
Glucose, UA: NEGATIVE mg/dL
Protein, ur: 30 mg/dL — AB
Specific Gravity, Urine: 1.027 (ref 1.005–1.030)
Urobilinogen, UA: 1 mg/dL (ref 0.0–1.0)

## 2010-06-18 LAB — COMPREHENSIVE METABOLIC PANEL
ALT: 11 U/L (ref 0–35)
Albumin: 3.5 g/dL (ref 3.5–5.2)
Alkaline Phosphatase: 73 U/L (ref 39–117)
Calcium: 9.1 mg/dL (ref 8.4–10.5)
GFR calc Af Amer: >60 mL/min (ref >60)
Potassium: 4 mEq/L (ref 3.5–5.1)
Sodium: 145 mEq/L (ref 135–145)
Total Protein: 6.9 g/dL (ref 6.0–8.3)

## 2010-06-18 LAB — DIFFERENTIAL
Basophils Relative: 1 % (ref 0–1)
Eosinophils Absolute: 0.2 10*3/uL (ref 0.0–0.7)
Lymphs Abs: 1.9 10*3/uL (ref 0.7–4.0)
Monocytes Absolute: 0.5 10*3/uL (ref 0.1–1.0)
Monocytes Relative: 9 % (ref 3–12)

## 2010-06-18 LAB — CBC
Platelets: 124 10*3/uL — ABNORMAL LOW (ref 150–400)
RDW: 14.8 % (ref 11.5–15.5)

## 2010-06-24 ENCOUNTER — Ambulatory Visit: Payer: Medicare Other | Attending: Neurology

## 2010-06-24 DIAGNOSIS — G4733 Obstructive sleep apnea (adult) (pediatric): Secondary | ICD-10-CM | POA: Insufficient documentation

## 2010-06-29 NOTE — Procedures (Signed)
[name], [name]               ACCOUNT NO.:  [id number]  MEDICAL RECORD NO.:  [id number]          PATIENT TYPE:  OUT  LOCATION:  SLEEP LAB                     FACILITY:  [facility]  PHYSICIAN:  Jakyra Kenealy A. Gerilyn Pilgrim, M.D. DATE OF BIRTH:  16-Mar-1955  DATE OF STUDY:                           NOCTURNAL POLYSOMNOGRAM  REFERRING PHYSICIAN:  MICHAEL GAVIN ROBSON  REFERRING PHYSICIAN:  Clarence Dunsmore  INDICATIONS:  A 58-year lady who has had a previous study showing severe complex sleep apnea syndrome with a combination of central and obstructive sleep apnea syndrome.  The patient failed conventional CPAP and BiPAP, and therefore servo-ventilation was ordered.  INDICATION FOR STUDY:  EPWORTH SLEEPINESS SCORE:  MEDICATIONS:  Amlodipine, aspirin, clonidine, Cymbalta, Depakote, Keppra, labetalol, lactulose, losartan, milk of magnesium, MiraLax, omeprazole, selenium, Sinemet, Synthroid, vitamin D3, Tylenol.  EPWORTH SLEEPINESS SCALE: 1. BMI 33.  ARCHITECTURAL SUMMARY:  The total recording time is 405 minutes.  Sleep efficiency 58%.  Sleep latency 0 minute.  REM latency 80 minutes.  Stage N1 27%, N2 63%, N3 5%, and REM sleep 3.4%.  RESPIRATORY SUMMARY:  Baseline oxygen saturation 91, lowest saturation 80.  The patient was placed on servo-ventilation using a Respironics auto SV.  The optimal pressures are as follows:  Maximum pressure 25 cm of water.  Maximum EPAP 25, minimum EPAP 7, maximum pressure support 15, and minimum pressure support 0.  Bi-Flex/1 and rate was set on auto.  LIMB MOVEMENT SUMMARY:  PLM index 0.  ELECTROCARDIOGRAM SUMMARY:  Average heart rate is 70 with no significant dysrhythmias observed.  IMPRESSION:  Complex sleep apnea syndrome, which responds with servo- ventilation using the Respironics auto SV using the following pressures: Maximum pressure 25 cm of water.  Maximum EPAP 25 cm of water.  Minimum EPAP 7 cm of water.  Maximum pressure support 15 cm of water.   Minimum pressure support 0 and a  Bi-Flex of 1, the respiratory rate was set on an auto mode.  SLEEP ARCHITECTURE:  RESPIRATORY DATA:  OXYGEN DATA:  CARDIAC DATA:  MOVEMENT-PARASOMNIA:  IMPRESSIONS-RECOMMENDATIONS:     Curry Seefeldt A. Gerilyn Pilgrim, M.D. Electronically Signed 06/29/2010 21:22:21    KAD/MEDQ  D:  06/28/2010 21:22:00  T:  06/29/2010 01:19:05  Job:  [id number]

## 2010-07-25 NOTE — Op Note (Signed)
[name], [name]               ACCOUNT NO.:  [id number]   MEDICAL RECORD NO.:  [id number]          PATIENT TYPE:  INP   LOCATION:  3109                         FACILITY:  [facility]   PHYSICIAN:  Antony Contras, MD     DATE OF BIRTH:  12/12/1955   DATE OF PROCEDURE:  07/14/2006  DATE OF DISCHARGE:                               OPERATIVE REPORT   PREOPERATIVE DIAGNOSIS:  Respiratory failure.   POSTOPERATIVE DIAGNOSIS:  Respiratory failure.   PROCEDURE:  Tracheostomy.   SURGEON:  Antony Contras, MD.   ANESTHESIA:  General endotracheal anesthesia.   COMPLICATIONS:  None.   INDICATIONS:  The patient is a 55 year old African American female, who  was found to have a hemorrhagic stroke in late April and has been in the  intensive care unit since then on a mechanical ventilator due to  inability to protect her airway.  She presents to the operating room for  surgical airway management.   DESCRIPTION OF PROCEDURE:  The patient was identified in the holding  room, and informed consent having been obtained, the patient was moved  to the operating suite and put on the operating table in supine  position.  Anesthesia was induced and the patient was maintained via  endotracheal anesthesia.  The eyes were taped closed and a shoulder roll  was placed.  The neck was prepped and draped in sterile fashion.  A  vertical incision was made in the lower neck using Bovie electrocautery.  Subcutaneous fat was removed with Bovie electrocautery.  The midline was  then divided down to the thyroid isthmus, which was also divided using  Bovie electrocautery.  The cricoid hook was placed under the cricoid  cartilage to provide superior traction.  An incision was then made  between rings 2 and 3 on the anterior tracheal wall using a 15-blade  scalpel.  This was extended with curved scissors.  Silk 2-0 suture was  placed around the ring above and around the ring below the trach site as  stay sutures.  The  endotracheal tube was then backed out to above the  trach site, and a #6 cuffed Shiley trach tube was placed without  difficulty.  The cuff was inflated and the inner cannula was added.  The  anesthesia circuit was hooked up and the patient was successfully  ventilated.  Stay sutures were tied with 2 knots above and 1 knot below  the trach site.  The flange was then secured to the anterior neck using  0 silk in 4 quadrants.  A trach dressing and trach tie were added.  The  patient was then returned to Anesthesia for wakeup, and was moved to the  intensive care unit in stable condition.      Antony Contras, MD  Electronically Signed     DDB/MEDQ  D:  07/14/2006  T:  07/14/2006  Job:  [id number]

## 2010-07-25 NOTE — Consult Note (Signed)
[name], DIENER               ACCOUNT NO.:  [id number]   MEDICAL RECORD NO.:  [id number]          PATIENT TYPE:  INP   LOCATION:  2305                         FACILITY:  [facility]   PHYSICIAN:  Hewitt Shorts, M.D.DATE OF BIRTH:  1955-04-01   DATE OF CONSULTATION:  07/06/2006  DATE OF DISCHARGE:                                 CONSULTATION   NEUROSURGERY CONSULTATION:   HISTORY OF PRESENT ILLNESS:  Patient is a 55 year old black female with  a longstanding history of hypertension, who suffered an aneurysmal  hemorrhage in 1999, underwent right pterional craniotomy clipping of  aneurysm at [facility] [place].  She has had ongoing  difficulties with hypertension, but recently has not been taking her  blood pressure medications and that is why she presented with slurred  speech and left-sided weakness.  Patient was evaluated in the emergency  room by Dr. Pearlean Brownie.  A CT of the brain was obtained and showed a right  basal ganglia intracerebral hematoma, consistent with hypertensive  hemorrhage and the patient was admitted to the stroke neurology service  yesterday afternoon.  Last evening, patient deteriorated with decreasing  mental status and patient was intubated and placed on an ventilator.  Repeat CT was obtained and showed increased intracerebral hematoma with  extension now into the ventricular system with early hydrocephalus.  Patient was reevaluated this morning by Dr. Pearlean Brownie, who feels her  prognosis is quite poor, but requested neurosurgery consultation for  consideration of placement of an intraventricular catheter.   PAST MEDICAL HISTORY:  1. Notable for a history of longstanding hypertension from previous      stroke with aneurysm clipping and also a previous left internal      capsule stroke.  2. She has hyperlipidemia.  She is on no medications at the time of      admission.   PREVIOUS SURGERY:  Does include craniotomy.   SHE APPARENTLY HAS AN ALLERGY  TO COUMADIN.   Parents are both living in their mid 43s with hypertension.   SOCIAL HISTORY:  Patient is married.  She does not smoke or drink  alcoholic beverages.  Her medical records direct her at a nursing home  for over 20 years.  She lives in [place].   REVIEW OF SYSTEMS:  Unobtainable due to altered mental status.   PHYSICAL EXAMINATION:  GENERAL:  Patient an obese, black female,  intubated on a ventilator, sedated with Versed and fentanyl.  No history  is obtainable from the patient herself.  VITAL SIGNS:  Show a temperature of 98.8.  Pulse 76.  Blood pressure  164/93.  She is on a Cardene drip.  NEUROLOGICALLY:  The patient does not open her eyes to voice or pain.  Pupils are 1.5 mm bilaterally round, but not reactive to light.  Her  gaze is disconjugate.  She withdraws the right upper and lower extremity  to central pain, but does not follow commands.  The left upper extremity  and left lower extremity are flaccid to central pain.  Toes are  bilaterally upgoing.  HEART:  Regular rate and rhythm.  Normal  S1, S2.  There is no murmur.  LUNGS:  Clear to auscultation.  She has symmetrical respiratory  excursion.  SENSORY EXAMINATION:  Shows no response to pinprick bilaterally.  Reflexes are absent throughout the upper and lower extremities.   IMPRESSION:  Severe stroke secondary to hypertensive intracerebral  hematoma to the right basal ganglia with resulting left hemiplegia and  depressed level of consciousness.  There is secondary intraventricular  hemorrhage with secondary hydrocephalus.   RECOMMENDATIONS:  I spoke with Dr. Pearlean Brownie, as well as with the nursing  staff here in the surgical ICU, as well as with the patient's husband,  her daughter and her niece.  I agree with Dr. Pearlean Brownie that the prognosis  is poor for survival and I think that if she were to survive it would be  in a debilitated state, requiring total care.  I have explained to the  family that placement of  intraventricular catheter will not reverse any  of the neurologic damage or difficulties, but instead increases slightly  the chance of survival to live the remainder of her days in a  debilitated state requiring total care in a nursing home facility,  probably requiring tracheostomy and placement of a gastrostomy.   I have discussed with them the nature of the intraventricular catheter,  they are familiar with it since one was placed at the time of her  aneurysm surgery 9 years ago.   They have had an opportunity to discuss, not only among themselves, but  with other family members, including her parents and in the end they  asked for [place] to place an intraventricular catheter and, therefore, this  will be done and the procedure will be dictated under separate note.      Hewitt Shorts, M.D.  Electronically Signed     RWN/MEDQ  D:  07/06/2006  T:  07/06/2006  Job:  [id number]   cc:   Pramod P. Pearlean Brownie, MD

## 2010-07-25 NOTE — Op Note (Signed)
NAMESAFA, [name]               ACCOUNT NO.:  [id number]   MEDICAL RECORD NO.:  [id number]          PATIENT TYPE:  INP   LOCATION:  3109                         FACILITY:  [facility]   PHYSICIAN:  Hedwig Morton. Juanda Chance, MD     DATE OF BIRTH:  1955/12/19   DATE OF PROCEDURE:  DATE OF DISCHARGE:                               OPERATIVE REPORT   GASTROENTEROLOGIST:  Hedwig Morton. Juanda Chance, MD.   NAME OF PROCEDURE:  Percutaneous endoscopic gastrostomy.   INDICATIONS:  This 55 year old African American female, status post 1999  craniotomy and clipping of aneurysm, was admitted with chronic  hypertension and new hemorrhage.  She had to be intubated and had an  intraventricular catheter placed.  She has remained ventilator dependent  and will need a tracheostomy as well as long-term nutritional support,  currently with tube feedings, but eventually with percutaneous  gastrostomy.  We have discussed placement of gastrostomy with the  husband and obtained permission from him.   ENDOSCOPE:  Olympus single-channel videoscope.   SEDATION:  The patient was on a fentanyl drip 100 mcg per hour, and a  Versed drip 4 mg per hour.  She also received an additional bolus of  fentanyl 100 mcg IV and Versed 8 mg IV, as well as vecuronium bromide 5  mg IV.   DESCRIPTION OF PROCEDURE:  The Olympus single-channel videoscope passed  under direct vision through the posterior pharynx via the endotracheal  tube with the cuff deflated into the stomach.  The abdominal wall was  transilluminated with the endoscope.  The gastrostomy site was located  in the epigastrium in the midline, to the left of the midline.  The skin  was infiltrated with 1% Xylocaine with epinephrine.  A small incision  was made.  A Seldinger needle passed through the abdominal wall into the  gastric lumen under direct vision.  A guide wire was then placed through  the Seldinger needle and pulled back with the snare through the  esophagus and outside of  the mouth.  The gastrostomy tube was then  placed over the guide wire with the tapered end first, and advanced  through the esophagus, through the stomach, and through the gastrostomy  opening so that the mushroom-T bumper was snug against the abdominal  wall.  Retention disk and adapters were placed.  The position of the  gastrostomy was checked by reendoscoping the patient and taking video  photographs.  The patient tolerated the procedure well.   IMPRESSION:  1. Placement of 24-[employer] percutaneous gastrostomy.  2. Essentially normal upper endoscopy of esophagus, stomach and      duodenum.   PLAN:  Routine orders have been written for antibiotics and proper  measures for skin care, as well as resumption of the tube feedings  tomorrow morning.      Hedwig Morton. Juanda Chance, MD  Electronically Signed     DMB/MEDQ  D:  07/13/2006  T:  07/13/2006  Job:  [id number]   cc:   Coralyn Helling, MD

## 2010-07-25 NOTE — Op Note (Signed)
[name], [name]               ACCOUNT NO.:  [id number]   MEDICAL RECORD NO.:  [id number]          PATIENT TYPE:  INP   LOCATION:  2305                         FACILITY:  [facility]   PHYSICIAN:  Hewitt Shorts, M.D.DATE OF BIRTH:  Mar 17, 1955   DATE OF PROCEDURE:  07/06/2006  DATE OF DISCHARGE:                               OPERATIVE REPORT   PREOPERATIVE DIAGNOSIS:  Obstructive hydrocephalus, intraventricular  hemorrhage and cerebral hematoma.   POSTOPERATIVE DIAGNOSIS:  Obstructive hydrocephalus, intraventricular  hemorrhage and cerebral hematoma.   PROCEDURE:  Placement of a left frontal intraventricular catheter.   SURGEON:  Hewitt Shorts, M.D.   ANESTHESIA:  Xylocaine 1% local anesthetic with intravenous sedation  with fentanyl and Versed.   INDICATION:  The patient is a 55 year old woman who suffered a severe  stroke with a large right basal ganglia intracerebral hematoma that has  since fed into the ventricular system with associated obstructive  hydrocephalus.  The decision was made to proceed with placement of an  intraventricular catheter for decompression of the hydrocephalus.   PROCEDURE:  At the patient's bedside, the left frontal region was shaved  and prepped with Betadine solution, draped in a sterile fashion.  A  small 5-mm incision was made in the left mid pupillary line adjacent to  the coronal suture and then a twist drill hole was made, the dura  punctured and then we gently passed a ventricular catheter into the  ventricular system.  Once the catheter was draining CSF, it was tunneled  subcutaneously and brought out through a separate stab incision.  It was  sutured to the scalp  at three points with 3-0 nylon suture exiting  through a closed collection system.  The opening pressure was elevated.  The CSF was bloody.  This system was set up to drain at an opening  pressure of 10 cm of water.      Hewitt Shorts, M.D.  Electronically  Signed     RWN/MEDQ  D:  07/06/2006  T:  07/06/2006  Job:  [id number]

## 2010-07-25 NOTE — Discharge Summary (Signed)
[name], MEHLMAN               ACCOUNT NO.:  [id number]   MEDICAL RECORD NO.:  [id number]          PATIENT TYPE:  INP   LOCATION:  5155                         FACILITY:  [facility]   PHYSICIAN:  Pramod P. Pearlean Brownie, MD    DATE OF BIRTH:  20-Jan-1956   DATE OF ADMISSION:  07/05/2006  DATE OF DISCHARGE:  07/30/2006                               DISCHARGE SUMMARY   DISCHARGE DIAGNOSES:  1. Right basal ganglia intracranial hemorrhage secondary to      hypertension.  2. Renal insufficiency, resolved.  3. Diabetes.  4. Hypertension.  5. History of left internal capsule infarct in 1999, with mild      residual left sided weakness.  6. History of brain aneurysm which was culled endovascularly.  7. Obesity.  8. Sedentary lifestyle.  9. Dyslipidemia.  10.Craniotomy with clipping of aneurysm in 1999.   DISCHARGE MEDICATIONS:  1. Labetalol 400 mg b.i.d.  2. Norvasc 5 mg a day.  3. Lantus 14 units b.i.d. subcutaneously.  4. Bacitracin and zinc ointment topically b.i.d.  5. Catapres patch 0.3 mg every 7 days.  6. Ferrous sulfate 325 mg a day.  7. Free water 60 ml per tube before and after each tube feeding.  8. Jevity 360 ml at 8, 12, 4, and 8 daily.  9. Klonopin 0.25 mg q.h.s.  10.Lactinex a packet to each tube feeding.  11.Lioresal 10 mg p.o. b.i.d.  12.Protonix 40 mg a day.  13.Tekturna 300 mg a day.  14.Vitamin C 250 mg a day.   STUDIES PERFORMED:  1. Chest x-ray on admission showed no acute abnormality.  2. CT of the head showed right basal ganglia hemorrhage with followup      CT showing intraparenchymal hematoma with interventricular      penetration, brain swelling, and midline shift.  3. Followup CT at 24 hours shows no change on large right basal      ganglia hematoma and interventricular hemorrhage, slight      improvement in midline shift.  4. CT of the head just past 24 hours shows a large hematoma in right      parietal region with a 7-mm right-to-left shift, and blood     occupying the compressed right lateral ventricle.  There is an old      aneurysm clipped near the circle of Willis, remote infarct in the      left internal capsule.  5. CT of head at 48 hours shows stable right basal ganglia hemorrhage      and interventricular hemorrhage.  There has been an interval      placement of a left frontal interventricular drain with stable      ventricular size and midline shift.  6. CT of the head at 5 days shows stable appearance of right basal      ganglia parenchymal hemorrhage, 6-to-7-mm midline shift with      ventriculostomy remains in place.  7. CT of the head, day 8, shows large right basal ganglia hematoma      stable, slightly more midline shift at 9-mm.  8. CT of the head,  day 11, showed respective evolutionary findings of      decreased density of blood, no additional bleeding.  9. CT of the head, day 12, showed stable right basal ganglia,      parenchymal hemorrhage, left frontal ventriculostomy has been      pulled back slightly, overall size of ventricle is not changed.  10.CT of the head, day 15, shows left frontal ventriculostomy capsule      removed without significant change.  11.Multiple chest x-rays assessing intubation with no significant      abnormalities.  12.EKG on admission showed a normal sinus rhythm with a first degree      AV block with occasional premature ventricular complexes.  13.Tracheostomy performed by Dr. Christia Reading on Jul 16, 2006, without      complication.  14.PEG placement by Dr. Lina Sar on Jul 13, 2006, without      complication.  15.Ventriculostomy placement by Dr. Jule Ser on July 06, 2006,      without difficulty or complication.  16.A 2D echocardiogram showed EF of 65-75% with left pleural effusion,      no definite clot seen.  Carotid Doppler not performed.   LABORATORY STUDIES:  CBC on admission was 13.3 with hydration and  hospitalization, dropped to a low of 7.6.  Transfused 2 units then up to   10.7.  Hematocrit 33.5, MCV 79, RDW 20.9.  Her differential was normal.  Chemistries with sodium 140, potassium 4, chloride 103, CO2 27, BUN 27,  creatinine 0.74, and glucose 109.  Her liver function test with AST 68,  ALT 68, albumin 2.5, alkaline phosphatase 73, total bilirubin 0.7, and  total protein 6.6.  Urinalysis, last performed Jul 28, 2006, with 0-2  white blood cells, 3-6 red blood cells, and trace leukocyte esterase.  Coagulation studies on admission were normal.  Homocystine 15.8.  Hemoglobin A1c 6.1.  Cardiac enzymes with elevated troponin 0.13, 0.17,  and 0.11.  CK-MB normal.  CK slightly elevated at 192 and 410.  Her  cholesterol was 209, triglycerides 83, HDL 55, and LDL 124.  Iron  studies showed 31 iron, 11% saturation, but TIBC 285, UIBC 254, B12 211,  folate serum 12.6, and ferritin 44.  Urine pregnancy test was negative.  There was a catheter tip culture with coag negative strep.   HISTORY OF PRESENT ILLNESS:  [name] is a 55 year old African  American female who was brought in by [facility] as a code  stroke.  She was working at the [employer] when she  developed a sudden onset of slurred speech and left sided weakness.  EMS  was called and they described the patient as having a blood pressure of  220/120 with left sided weakness and drowsiness.  Upon arrival to the  emergency room, she was drowsy with left hemiparesis and an NIH Stroke  Scale of 16.  CT of the brain showed a large 7.5 x 5.5 x 3.6-cm right  deep basal ganglia hemorrhage with over read mild mass effect on the  frontal pons with no interventricular extension.  She was admitted to  the ICU for stroke evaluation.  She was not a t-PA candidate secondary  to hemorrhage.  Over the course of the next several days, the blood did  extend into her ventricular system, and Dr. Jule Ser was consulted and placed a ventriculostomy.  She remained intubated just prior to this  point and  maintained the ventriculostomy and ventilator for many days.  Eventually,  the ventriculostomy was able to be removed.  She never had a  further increase in size of her hemorrhage.  She was eventually able to  be weaned off the ventilator after the ventriculostomy was removed, and  the patient was sent to the floor.  During her ICU stay she was managed  by critical care medicine.  The patient remained comatose after  extubation and was unable to swallow and required PEG tube placement by  Dr. Lina Sar.  This was placed without difficulty.  The patient also  required trach to assist with extubation and this was placed without  difficulty.  Of note in patient's history, she has a history of hypertension and had  a stroke in 1999.  At that time, she was hypertensive and had an  aneurysm that had to be culled.  She was noncompliant with her blood  pressure medicine at followup likely leading to the event of this  admission.  Tube feedings were started via the PEG.  The patient tolerated it very  well.  She was then changed to bolus tube feedings without difficulty.  In the hospital, she was also found to have elevated glucose and was  started on Lantus.  This new diagnosis of diabetes is another risk  factor that she has.  During the hospitalization, the patient had hemoglobin drop into the 7s  for which she received two transfusions.  There was documentation of  vaginal bleeding in the critical care medicines notes and this is  potentially menstrual related, given her young age.  No other source of  bleeding was ever identified.  Her stools were guaiac negative.  The  patient also developed increased creatinine during her hospitalization  most likely due to dehydration.  The patient was re-hydrated and had  return to normal.  The patient was evaluated by PT OT and speech therapy  as she is totally dependent on care.  Recommendations were made for  nursing home placement for her.  Social  worker began Financial controller and bed was  found at [facility] [place].  The family is deciding on this bed  offer and once agreeable will discharge her there.  Of note, during the  hospitalization the patient did have some increased tone and Baclofen  was started.  This seems to have helped significantly.   CONDITION ON DISCHARGE:  The patient is alert, follows commands, is  dysarthric.  She has right facial weakness and dense left hemiparesis.  Her heart rate is regular.  Her breath sounds are clear.   DISCHARGE PLAN:  1. Transfer to skilled nursing facility for continuation of PT, OT,      and speech therapy.  2. Off antiplatelets secondary to hemorrhage, when they do consider      aspirin in the future.  3. The patient needs tight risk factor control including systolic      blood pressure less than 130.  4. Follow up renal insufficiency and make sure creatinine remains      stable. 5. Diabetes, controlled with goal glucose less than 140.  6. Followup primary care physician at skilled nursing facility.  7. Followup with Dr. Delia Heady in 2-3 months.      Annie Main, N.P.    ______________________________  Sunny Schlein. Pearlean Brownie, MD    SB/MEDQ  D:  07/30/2006  T:  07/30/2006  Job:  [id number]   cc:   Nicholos Johns at [facility], M.D.  Hedwig Morton. Juanda Chance, MD  Excell Seltzer  Jenne Pane, MD

## 2010-07-25 NOTE — Consult Note (Signed)
[name], [name]               ACCOUNT NO.:  [id number]   MEDICAL RECORD NO.:  [id number]          PATIENT TYPE:  INP   LOCATION:  3109                         FACILITY:  [facility]   PHYSICIAN:  Antony Contras, MD     DATE OF BIRTH:  1955/10/11   DATE OF CONSULTATION:  07/12/2006  DATE OF DISCHARGE:                                 CONSULTATION   REQUESTING SERVICE:  Neurology.   CHIEF COMPLAINT:  Respiratory failure.   HISTORY OF PRESENT ILLNESS:  The patient is a 55 year old African  American female who had sudden onset of slurred speech and left-sided  weakness on April 28 and was brought to the emergency department.  Her  blood pressure was exceedingly high at 220/120.  A CT scan demonstrated  a large right basal ganglia hemorrhage and she was admitted to the  stroke service for further evaluation.   She has a past history of a left-sided internal capsule infarct in 1999.  Soon after admission, she required elective intubation because of  worsening mental status and labored respiration.  Intubation was not  difficult.  The neurosurgery team performed a left intraventricular  catheter placement on the day after admission.  Since then, she has  remained intubated on a ventilator with a poor mental status without  improvement.  Without the ability to protect her airway and with  anticipation of continued mechanical ventilation use, tracheostomy is  requested for airway management.   PAST MEDICAL HISTORY:  1. CVA.  2. Brain aneurysm.  3. Hypertension.  4. Obesity.  5. Hyperlipidemia.   PAST SURGICAL HISTORY:  Craniotomy with clipping of aneurysm in 1999.   MEDICATIONS:  Tekturna, Peridex oral rinse, Catapres patch, Lantus and  NovoLog insulin, Lactinex, Protonix, Maxzide and Jevity tube feeds.   ALLERGIES:  Coumadin.   SOCIAL HISTORY:  The patient is married.  She does not smoke or drink  alcohol.  She lives in [place].  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   FAMILY HISTORY:  Hypertension.   REVIEW OF SYSTEMS:  Unable to be obtained due to a mental status.   LABORATORIES:  White blood count 9.0, hemoglobin 8.8, platelets 247, INR  1.0.   PHYSICAL EXAM:  VITAL SIGNS:  Blood pressure 132/68, pulse 78,  respirations 17.  GENERAL:  The patient is intubated and sedated.  She is unresponsive.  EARS:  External ears are normal.  External canals are patent.  EYES:  The patient has no movement of her eyes and her eyes are closed.  NOSE:  External nose is normal.  There is a feeding tube through the  left nasal passage.  MOUTH:  The patient is orotracheally intubated.  Oral exam is limited by  the intubation.  FACE:  There are no facial abnormalities.  NECK:  The neck is mildly obese with normal landmarks and no scars.   ASSESSMENT:  The patient is a 55 year old African American female with  respiratory failure due to intracerebral hemorrhage.  The prognosis is  poor for recovery and with concerns about airway protection,  tracheostomy is  planned.   PLAN:  Tracheostomy will be scheduled for later in the week and consent  will be obtained from her husband.      Antony Contras, MD  Electronically Signed     DDB/MEDQ  D:  07/12/2006  T:  07/13/2006  Job:  [id number]

## 2010-07-25 NOTE — H&P (Signed)
[name], [name]               ACCOUNT NO.:  [id number]   MEDICAL RECORD NO.:  [id number]          PATIENT TYPE:  INP   LOCATION:  2305                         FACILITY:  [facility]   PHYSICIAN:  Pramod P. Pearlean Brownie, MD    DATE OF BIRTH:  January 27, 1956   DATE OF ADMISSION:  07/05/2006  DATE OF DISCHARGE:                              HISTORY & PHYSICAL   REFERRING PHYSICIAN:  Mariel Aloe, M.D.   REASON FOR REFERRAL:  Code stroke.   HISTORY OF PRESENT ILLNESS:  [name] is a 55 year old African-  American lady who was brought in by [facility] for a code  stroke.  She was working at the __________  when she developed a sudden  onset of slurred speech and left-sided weakness.  EMS was called and  they described the patient as having a blood pressure of 220/120 with  left-sided weakness and feeling drowsy.  Upon arrival in the emergency  room, she was found to be drowsy with left hemiparesis and NIH stroke  scale was found to be 16.  She had a CT scan done which showed a large  7.5 x 5.5 x 3.6-cm right deep basal ganglia hemorrhage with overread  mild mass effect on the frontal pons with no intraventricular extension.  She was admitted to the stroke service for further evaluation.   PAST MEDICAL HISTORY:  Significant for:  1. 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  2. She was also found to have a brain aneurysm which was coiled      endovascularly.  3. She has a history of longstanding hypertension, but has not been      taking any blood pressure medicines for the last eight years.  4. She has also other vascular risk factors of obesity, sedentary      lifestyle and hyperlipidemia.   MEDICATION ALLERGIES:  COUMADIN, THOUGH THE TRUE NATURE IS NOT KNOWN.   Amylase pretty significant for hypertension with multiple strokes.   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  She does not smoke or  drink.  She lives in [place] with  husband and son.   REVIEW OF SYSTEMS:  Not significant for any recent chest pain, cough,  shortness of breath, diarrheal illness.  Positive for right ankle  swelling.   PHYSICAL EXAMINATION:  GENERAL:  Reveals an obese African-American lady  who is lethargic and hard to arouse.  VITAL SIGNS:  Blood pressure on admission by EMS was 288/196.  She was  given 40 mg of IV labetalol and blood pressure came down to 125/60.  Since then, she has been on IV Cardene drip.  Heart rate is 87 per  minute and regular, respirations 28 per minute, 100% on 2 liters.  Distal pulses are heard.  HEENT:  Head is nontraumatic.  NECK:  Supple without bruits.  ENT EXAM:  Unremarkable.  HEART:  Regular heart sounds.  LUNGS:  Clear to auscultation.  NEUROLOGICAL EXAM:  Patient is lethargic.  She can barely be  aroused,  but frequent sleepy not responding.  Initially when seen in the  emergency room, she told me her name and was following commands well,  but then she vomited, was given some Zofran then taken off __________ .  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  She follows  gaze in all directions well.  There is left lower facial weakness.  There is dense left hemiplegia with 0/5 in left lower extremity and 1 to  2/5 in lower extremities.  Pulses are +2 on the right side.  Deep tendon  reflexes are absent on the left and present on the right side __________  .  Gait and coordination could not be tested, but she responds to pain  with stimuli on the right, but not well on the left.   DATA REVIEWED:  CT scan with non-contrast study reveals a large 7.5 x  5.4 x 6.6-cm deep basal ganglia hemorrhage with mild mass effect of the  frontal pons with no intraventricular extension.  Old left carotid  infarct is noted.  Aneurysm clip is noted near the Pcom, posterior  communicating artery.   LABS:  Significant for low potassium of 2.9.   IMPRESSION:  A  55 year old African-American lady with hyperacute large  right deep basal ganglia hypertensive hemorrhage.  History of left brain  subcortical infarct as well as aneurysm clip.  Patient has a poor  prognosis given the large size of her hemorrhage and peak aspirated  volume and __________ which correlates with greater than 80% mortality.  I had a long discussion with the patient's husband as well as daughter  with regard to her large hemorrhage, poor prognosis and answered  questions.  The family at this point want full support and ongoing  medical care.  She will be admitted to the intensive care unit for  further evaluation and treatment.  We will treat her blood pressure  aggressively with IV labetalol p.r.n. and Cardene drip.  Keep her NPO.  If she has further neurological worsening or has difficulty in  protecting her airway she may need elective intubation.  Repeat CT scan  of head in the morning or earlier if there is neurologic deterioration.  GI prophylaxis with Protonix and DVT prophylaxis with TED hoses.  She  may need Panda tube for medications and tube feeds after 24 hours.   I spent one hour of critical care time at the patient's bedside  directing and managing her care.           ______________________________  Sunny Schlein. Pearlean Brownie, MD     PPS/MEDQ  D:  07/05/2006  T:  07/05/2006  Job:  [id number]

## 2010-12-19 LAB — I-STAT 8, (EC8 V) (CONVERTED LAB)
BUN: 10
Bicarbonate: 24.7 — ABNORMAL HIGH
HCT: 44
Operator id: 257131
pCO2, Ven: 30.7 — ABNORMAL LOW
pH, Ven: 7.513 — ABNORMAL HIGH

## 2010-12-19 LAB — POCT CARDIAC MARKERS
CKMB, poc: 1.9
Myoglobin, poc: 55.6
Troponin i, poc: <0.05

## 2010-12-19 LAB — PROTIME-INR
INR: 1
Prothrombin Time: 13.3

## 2010-12-19 LAB — POCT I-STAT CREATININE: Creatinine, Ser: 0.6

## 2011-03-05 ENCOUNTER — Emergency Department (HOSPITAL_COMMUNITY)
Admission: EM | Admit: 2011-03-05 | Discharge: 2011-03-06 | Disposition: A | Discharge status: Other Acute Inpatient Hospital | Payer: Medicare Other | Attending: Emergency Medicine | Admitting: Emergency Medicine

## 2011-03-05 ENCOUNTER — Encounter (HOSPITAL_COMMUNITY): Payer: Self-pay

## 2011-03-05 DIAGNOSIS — G40909 Epilepsy, unspecified, not intractable, without status epilepticus: Secondary | ICD-10-CM | POA: Insufficient documentation

## 2011-03-05 DIAGNOSIS — R400 Somnolence: Secondary | ICD-10-CM

## 2011-03-05 DIAGNOSIS — R4182 Altered mental status, unspecified: Secondary | ICD-10-CM | POA: Insufficient documentation

## 2011-03-05 DIAGNOSIS — Z8673 Personal history of transient ischemic attack (TIA), and cerebral infarction without residual deficits: Secondary | ICD-10-CM | POA: Insufficient documentation

## 2011-03-05 DIAGNOSIS — R404 Transient alteration of awareness: Secondary | ICD-10-CM | POA: Insufficient documentation

## 2011-03-05 DIAGNOSIS — I10 Essential (primary) hypertension: Secondary | ICD-10-CM | POA: Insufficient documentation

## 2011-03-05 HISTORY — DX: Respiratory failure, unspecified, unspecified whether with hypoxia or hypercapnia: J96.90

## 2011-03-05 HISTORY — DX: Urinary tract infection, site not specified: N39.0

## 2011-03-05 HISTORY — DX: Unspecified psychosis not due to a substance or known physiological condition: F29

## 2011-03-05 HISTORY — DX: Metabolic encephalopathy: G93.41

## 2011-03-05 NOTE — ED Notes (Signed)
Found unresponsive sitting in chair at nursing facility. Upon EMS arrival patient responded to scent of ammonia. In triage, pt. Alert and oriented.

## 2011-03-06 NOTE — ED Provider Notes (Signed)
History     CSN: [id number]  Arrival date & time 03/05/11  2316   First MD Initiated Contact with Patient 03/06/11 0414      Chief Complaint  Patient presents with  . Altered Mental Status    (Consider location/radiation/quality/duration/timing/severity/associated sxs/prior treatment) HPI This is a 55 year old black female who was sent from her nursing home by a staff member encounter sitting in a chair and could not awaken her. EMS aroused the patient with an ammonia capsule and was brought to the ED. On arrival he ED staff noted her to be awake alert and oriented. The patient's daughter, who also works at that nursing home but was not on duty, states that the patient frequently sits up watching TV and falls asleep. She states her mother is normally difficult to arouse, and this was a normal event for her. Her daughter states that her mother is at her baseline. The patient herself has no acute complaint.  Past Medical History  Diagnosis Date  . Hypertension   . CVA (cerebral vascular accident)   . Seizure disorder   . Metabolic encephalopathy 05/06/2010  . Respiratory failure 05/06/10  . UTI (lower urinary tract infection) 05/06/10  . 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/14/10    History reviewed. No pertinent past surgical history.  No family history on file.  History  Substance Use Topics  . Smoking status: Not on file  . Smokeless tobacco: Not on file  . Alcohol Use:     OB History    Grav Para Term Preterm Abortions TAB SAB Ect Mult Living                  Review of Systems  All other systems reviewed and are negative.    Allergies  Codeine  Home Medications  No current outpatient prescriptions on file.  BP 138/83  Pulse 76  Temp(Src) 97.9 F (36.6 C) (Oral)  Resp 13  Ht 5\' 1"  (1.549 m)  Wt 235 lb (106.595 kg)  BMI 44.40 kg/m2  SpO2 94%  Physical Exam General: Well-developed, well-nourished female in no acute distress; appearance consistent with age of  record HENT: normocephalic, atraumatic Eyes: pupils equal round and reactive to light; extraocular muscles intact Neck: supple Heart: regular rate and rhythm Lungs: clear to auscultation bilaterally Abdomen: soft; nondistended Extremities: No deformity; full range of motion; pulses normal; Edema of lower legs left greater than right; mild contracture of left lower extremity Neurologic: awake, is sleeping but easily aroused; left hemiplegia; oriented to person place and day Skin: Warm and dry     ED Course  Procedures (including critical care time)    MDM          Hanley Seamen, MD 03/06/11 [phone]

## 2011-03-06 NOTE — ED Notes (Signed)
Pt. Cleaned; incontinent of urine.

## 2012-06-15 DIAGNOSIS — E46 Unspecified protein-calorie malnutrition: Secondary | ICD-10-CM

## 2012-06-15 DIAGNOSIS — K219 Gastro-esophageal reflux disease without esophagitis: Secondary | ICD-10-CM

## 2012-07-20 DIAGNOSIS — E1351 Other specified diabetes mellitus with diabetic peripheral angiopathy without gangrene: Secondary | ICD-10-CM

## 2012-07-20 DIAGNOSIS — F329 Major depressive disorder, single episode, unspecified: Secondary | ICD-10-CM

## 2012-07-20 DIAGNOSIS — E785 Hyperlipidemia, unspecified: Secondary | ICD-10-CM

## 2012-07-27 ENCOUNTER — Encounter (HOSPITAL_COMMUNITY): Payer: Self-pay

## 2012-07-27 ENCOUNTER — Inpatient Hospital Stay (HOSPITAL_COMMUNITY)
Admission: EM | Admit: 2012-07-27 | Discharge: 2012-07-31 | DRG: 917 | Disposition: A | Discharge status: Skilled Nursing Facility | Payer: PRIVATE HEALTH INSURANCE | Attending: Internal Medicine | Admitting: Internal Medicine

## 2012-07-27 ENCOUNTER — Emergency Department (HOSPITAL_COMMUNITY): Payer: PRIVATE HEALTH INSURANCE

## 2012-07-27 DIAGNOSIS — T50901A Poisoning by unspecified drugs, medicaments and biological substances, accidental (unintentional), initial encounter: Secondary | ICD-10-CM

## 2012-07-27 DIAGNOSIS — G4730 Sleep apnea, unspecified: Secondary | ICD-10-CM | POA: Diagnosis present

## 2012-07-27 DIAGNOSIS — D649 Anemia, unspecified: Secondary | ICD-10-CM | POA: Diagnosis present

## 2012-07-27 DIAGNOSIS — G929 Unspecified toxic encephalopathy: Secondary | ICD-10-CM | POA: Diagnosis present

## 2012-07-27 DIAGNOSIS — G4731 Primary central sleep apnea: Secondary | ICD-10-CM

## 2012-07-27 DIAGNOSIS — G92 Toxic encephalopathy: Secondary | ICD-10-CM | POA: Diagnosis present

## 2012-07-27 DIAGNOSIS — Y9210 Unspecified residential institution as the place of occurrence of the external cause: Secondary | ICD-10-CM | POA: Diagnosis present

## 2012-07-27 DIAGNOSIS — T40601A Poisoning by unspecified narcotics, accidental (unintentional), initial encounter: Principal | ICD-10-CM

## 2012-07-27 DIAGNOSIS — D696 Thrombocytopenia, unspecified: Secondary | ICD-10-CM

## 2012-07-27 DIAGNOSIS — G894 Chronic pain syndrome: Secondary | ICD-10-CM

## 2012-07-27 DIAGNOSIS — T400X1A Poisoning by opium, accidental (unintentional), initial encounter: Secondary | ICD-10-CM | POA: Diagnosis present

## 2012-07-27 DIAGNOSIS — R0689 Other abnormalities of breathing: Secondary | ICD-10-CM

## 2012-07-27 DIAGNOSIS — Z6841 Body Mass Index (BMI) 40.0 and over, adult: Secondary | ICD-10-CM

## 2012-07-27 DIAGNOSIS — E119 Type 2 diabetes mellitus without complications: Secondary | ICD-10-CM | POA: Diagnosis present

## 2012-07-27 DIAGNOSIS — I10 Essential (primary) hypertension: Secondary | ICD-10-CM | POA: Diagnosis present

## 2012-07-27 DIAGNOSIS — I69959 Hemiplegia and hemiparesis following unspecified cerebrovascular disease affecting unspecified side: Secondary | ICD-10-CM

## 2012-07-27 DIAGNOSIS — J9600 Acute respiratory failure, unspecified whether with hypoxia or hypercapnia: Secondary | ICD-10-CM

## 2012-07-27 DIAGNOSIS — I69354 Hemiplegia and hemiparesis following cerebral infarction affecting left non-dominant side: Secondary | ICD-10-CM

## 2012-07-27 DIAGNOSIS — G40909 Epilepsy, unspecified, not intractable, without status epilepticus: Secondary | ICD-10-CM

## 2012-07-27 DIAGNOSIS — Z7401 Bed confinement status: Secondary | ICD-10-CM

## 2012-07-27 DIAGNOSIS — E039 Hypothyroidism, unspecified: Secondary | ICD-10-CM

## 2012-07-27 DIAGNOSIS — Z8673 Personal history of transient ischemic attack (TIA), and cerebral infarction without residual deficits: Secondary | ICD-10-CM

## 2012-07-27 HISTORY — DX: Other chronic pain: G89.29

## 2012-07-27 HISTORY — DX: Acute respiratory failure, unspecified whether with hypoxia or hypercapnia: J96.00

## 2012-07-27 HISTORY — DX: Sleep apnea, unspecified: G47.30

## 2012-07-27 HISTORY — DX: Type 2 diabetes mellitus without complications: E11.9

## 2012-07-27 HISTORY — DX: Cramp and spasm: R25.2

## 2012-07-27 HISTORY — DX: Hypothyroidism, unspecified: E03.9

## 2012-07-27 HISTORY — DX: Major depressive disorder, single episode, unspecified: F32.9

## 2012-07-27 HISTORY — DX: Depression, unspecified: F32.A

## 2012-07-27 HISTORY — DX: Hemiplegia and hemiparesis following cerebral infarction affecting left non-dominant side: I69.354

## 2012-07-27 HISTORY — DX: Poisoning by unspecified narcotics, accidental (unintentional), initial encounter: T40.601A

## 2012-07-27 HISTORY — DX: Thrombocytopenia, unspecified: D69.6

## 2012-07-27 LAB — BASIC METABOLIC PANEL
BUN: 22 mg/dL (ref 6–23)
CO2: 33 mEq/L — ABNORMAL HIGH (ref 19–32)
GFR calc non Af Amer: 59 mL/min — ABNORMAL LOW (ref >90)
Glucose, Bld: 123 mg/dL — ABNORMAL HIGH (ref 70–99)
Potassium: 4.6 mEq/L (ref 3.5–5.1)
Sodium: 145 mEq/L (ref 135–145)

## 2012-07-27 LAB — BLOOD GAS, ARTERIAL
Bicarbonate: 28.4 mEq/L — ABNORMAL HIGH (ref 20.0–24.0)
Bicarbonate: 30.5 mEq/L — ABNORMAL HIGH (ref 20.0–24.0)
Delivery systems: POSITIVE
Drawn by: 22223
Drawn by: 22223
Expiratory PAP: 5
FIO2: 35 %
Inspiratory PAP: 15
O2 Content: 4 L/min
O2 Saturation: 96.1 %
O2 Saturation: 96.3 %
Patient temperature: 37
TCO2: 26.9 mmol/L (ref 0–100)
pCO2 arterial: 57.2 mmHg (ref 35.0–45.0)
pCO2 arterial: 72.2 mmHg (ref 35.0–45.0)
pH, Arterial: 7.243 — ABNORMAL LOW (ref 7.350–7.450)
pH, Arterial: 7.317 — ABNORMAL LOW (ref 7.350–7.450)
pO2, Arterial: 108 mmHg — ABNORMAL HIGH (ref 80.0–100.0)
pO2, Arterial: 91.6 mmHg (ref 80.0–100.0)

## 2012-07-27 LAB — MRSA PCR SCREENING: MRSA by PCR: NEGATIVE

## 2012-07-27 LAB — CBC WITH DIFFERENTIAL/PLATELET
Basophils Relative: 0 % (ref 0–1)
Eosinophils Absolute: 0.1 10*3/uL (ref 0.0–0.7)
Eosinophils Relative: 1 % (ref 0–5)
HCT: 34.3 % — ABNORMAL LOW (ref 36.0–46.0)
Hemoglobin: 10.6 g/dL — ABNORMAL LOW (ref 12.0–15.0)
Lymphs Abs: 1.3 10*3/uL (ref 0.7–4.0)
MCH: 30.4 pg (ref 26.0–34.0)
MCHC: 30.9 g/dL (ref 30.0–36.0)
MCV: 98.3 fL (ref 78.0–100.0)
Monocytes Absolute: 0.5 10*3/uL (ref 0.1–1.0)
Monocytes Relative: 8 % (ref 3–12)
Neutrophils Relative %: 72 % (ref 43–77)
RBC: 3.49 MIL/uL — ABNORMAL LOW (ref 3.87–5.11)

## 2012-07-27 LAB — LACTIC ACID, PLASMA: Lactic Acid, Venous: 1.7 mmol/L (ref 0.5–2.2)

## 2012-07-27 MED ORDER — NALOXONE HCL 1 MG/ML IJ SOLN
2.0000 mg/h | INTRAVENOUS | Status: DC
  Administered 2012-07-27: 2 mg/h via INTRAVENOUS
  Administered 2012-07-27: 1 mg/h via INTRAVENOUS
  Filled 2012-07-27: qty 4

## 2012-07-27 MED ORDER — SODIUM CHLORIDE 0.9 % IV BOLUS (SEPSIS)
500.0000 mL | Freq: Once | INTRAVENOUS | Status: AC
  Administered 2012-07-27: 500 mL via INTRAVENOUS

## 2012-07-27 MED ORDER — NALOXONE HCL 1 MG/ML IJ SOLN: 3.0000 mg/h | INTRAVENOUS | Status: DC

## 2012-07-27 MED ORDER — NALOXONE HCL 1 MG/ML IJ SOLN
INTRAMUSCULAR | Status: AC
  Filled 2012-07-27: qty 4

## 2012-07-27 MED ORDER — NALOXONE HCL 1 MG/ML IJ SOLN: 2.0000 mg/h | INTRAVENOUS | Status: DC

## 2012-07-27 MED ORDER — DEXTROSE 5 % IV SOLN
1.0000 mg/h | INTRAVENOUS | Status: DC
  Administered 2012-07-27: 3 mg/h via INTRAVENOUS
  Administered 2012-07-28: 1 mg/h via INTRAVENOUS
  Administered 2012-07-28: 0.5 mg/h via INTRAVENOUS
  Administered 2012-07-28: 1 mg/h via INTRAVENOUS
  Filled 2012-07-27: qty 4

## 2012-07-27 MED ORDER — SODIUM CHLORIDE 0.9 % IV SOLN
INTRAVENOUS | Status: AC
  Administered 2012-07-27 – 2012-07-28 (×2): via INTRAVENOUS

## 2012-07-27 MED ORDER — NALOXONE HCL 1 MG/ML IJ SOLN
1.0000 mg | Freq: Once | INTRAMUSCULAR | Status: AC
  Administered 2012-07-27: 1 mg via INTRAVENOUS
  Filled 2012-07-27: qty 2

## 2012-07-27 MED ORDER — NALOXONE HCL 1 MG/ML IJ SOLN
INTRAVENOUS | Status: DC
  Administered 2012-07-27 (×3): via INTRAVENOUS
  Filled 2012-07-27 (×23): qty 500

## 2012-07-27 MED ORDER — NALOXONE HCL 1 MG/ML IJ SOLN: 4.0000 mg/h | INTRAVENOUS | Status: DC

## 2012-07-27 MED ORDER — HYDRALAZINE HCL 20 MG/ML IJ SOLN: 10.0000 mg | Freq: Four times a day (QID) | INTRAMUSCULAR | Status: DC | PRN

## 2012-07-27 MED ORDER — SODIUM CHLORIDE 0.9 % IJ SOLN
3.0000 mL | Freq: Two times a day (BID) | INTRAMUSCULAR | Status: DC
  Administered 2012-07-28 – 2012-07-29 (×3): 3 mL via INTRAVENOUS

## 2012-07-27 MED ORDER — ONDANSETRON HCL 4 MG PO TABS: 4.0000 mg | ORAL_TABLET | Freq: Four times a day (QID) | ORAL | Status: DC | PRN

## 2012-07-27 MED ORDER — LEVETIRACETAM 500 MG/5ML IV SOLN
1000.0000 mg | Freq: Two times a day (BID) | INTRAVENOUS | Status: DC
  Administered 2012-07-27 – 2012-07-28 (×3): 1000 mg via INTRAVENOUS
  Filled 2012-07-27 (×4): qty 10

## 2012-07-27 MED ORDER — ONDANSETRON HCL 4 MG/2ML IJ SOLN: 4.0000 mg | Freq: Four times a day (QID) | INTRAMUSCULAR | Status: DC | PRN

## 2012-07-27 MED ORDER — NALOXONE HCL 1 MG/ML IJ SOLN
INTRAMUSCULAR | Status: AC
  Administered 2012-07-27: 1 mg via INTRAVENOUS
  Filled 2012-07-27: qty 2

## 2012-07-27 MED ORDER — LEVETIRACETAM 500 MG/5ML IV SOLN
INTRAVENOUS | Status: AC
  Filled 2012-07-27: qty 10

## 2012-07-27 MED ORDER — SODIUM CHLORIDE 0.9 % IV SOLN
INTRAVENOUS | Status: DC
  Administered 2012-07-27: 15:00:00 via INTRAVENOUS

## 2012-07-27 MED ORDER — SODIUM CHLORIDE 0.9 % IV BOLUS (SEPSIS)
250.0000 mL | Freq: Once | INTRAVENOUS | Status: AC
  Administered 2012-07-27: 250 mL via INTRAVENOUS

## 2012-07-27 MED ORDER — ALBUTEROL SULFATE (5 MG/ML) 0.5% IN NEBU: 2.5000 mg | INHALATION_SOLUTION | RESPIRATORY_TRACT | Status: DC | PRN

## 2012-07-27 NOTE — H&P (Addendum)
[facility] History and Physical  [name] WUJ:[id number] DOB: [birth year] DOA: 07/27/2012   PCP: Terald Sleeper, MD  Specialists: She is followed by a pain specialist, Dr. Welton Flakes  Chief Complaint: Unresponsiveness  HPI: [name] is a 57 y.o. female to the past medical history of stroke, chronic spasticity and chronic pain syndrome with baclofen and narcotic pump in place. She also has a history of hypertension, and seizure disorder. She lives in a skilled nursing facility and was found to be unresponsive earlier today. EMS was called. She was given doses of narcan with improvement in responsiveness and was brought into the hospital. She was noted to have apneic spells. She was given more doses of narcan and was started on an infusion. She was also placed on BiPAP. Patient is currently accompanied by her husband and her daughter. She is arousable, but unable to provide much history. Apparently, yesterday she was changed over from fentanyl to morphine through the pump. The reason for this change is not entirely clear. She also received a dose of Roxicodone earlier today. And, then she became unresponsive. She has required pain medications ever since her stroke in 2008. She is paralyzed on the left sided due to her stroke. It was a hemorrhagic stroke. History is limited as the patient is not fully arousable and is on BiPAP.  Home Medications: Prior to Admission medications   Medication Sig Start Date End Date Taking? Authorizing Provider  cloNIDine (CATAPRES) 0.1 MG tablet Take 0.1 mg by mouth every 6 (six) hours as needed. SBP>160   Yes Historical Provider, MD  divalproex (DEPAKOTE) 500 MG DR tablet Take 500 mg by mouth 2 (two) times daily.   Yes Historical Provider, MD  hydrOXYzine (ATARAX/VISTARIL) 25 MG tablet Take 12.5 mg by mouth daily as needed for itching or anxiety.   Yes Historical Provider, MD  labetalol (NORMODYNE) 200 MG tablet Take 200 mg by mouth 2 (two) times  daily.   Yes Historical Provider, MD  levETIRAcetam (KEPPRA) 1000 MG tablet Take 2,000 mg by mouth 2 (two) times daily.   Yes Historical Provider, MD  levothyroxine (SYNTHROID, LEVOTHROID) 137 MCG tablet Take 68.5 mcg by mouth daily.   Yes Historical Provider, MD  losartan (COZAAR) 100 MG tablet Take 100 mg by mouth at bedtime.   Yes Historical Provider, MD  oxyCODONE (OXY IR/ROXICODONE) 5 MG immediate release tablet Take 5 mg by mouth every 8 (eight) hours as needed for pain. Moderate to Severe pain   Yes Historical Provider, MD  simvastatin (ZOCOR) 20 MG tablet Take 20 mg by mouth at bedtime.   Yes Historical Provider, MD  zolpidem (AMBIEN) 5 MG tablet Take 2.5 mg by mouth at bedtime as needed for sleep.    Historical Provider, MD    Allergies:  Allergies  Allergen Reactions  . Codeine     Past Medical History: Past Medical History  Diagnosis Date  . Hypertension   . Seizure disorder   . Metabolic encephalopathy 05/06/2010  . Respiratory failure 05/06/10  . UTI (lower urinary tract infection) 05/06/10  . 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 Sleep apnea 04/2010    on CPAP, "severe central sleep apnea"  . 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 Chronic pain   . Depression   . 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 Spasticity     chronic  . Diabetes mellitus without complication     Past Surgical History  Procedure  Laterality Date  . Intrathecal pump implantation  2010    [employer]:  fentanyl, baclofen changed to morphine and baclofen on 07/2012  . Cerebral aneurysm repair  1999    Social History:  reports that she has never smoked. She does not have any smokeless tobacco history on file. She reports that she does not drink alcohol or use illicit drugs.  Living Situation: Lives in a skilled nursing facility Activity Level: She is bedbound   Family History: Unable as patient is not fully arousable  Review of Systems - Unable as patient is not fully  arousable  Physical Examination  Filed Vitals:   07/27/12 1836 07/27/12 1914 07/27/12 1915 07/27/12 1930  BP: 116/78 117/71 117/71 113/69  Pulse: 86 82 78   Temp:      TempSrc:      Resp: 21 18 20 18   Weight:      SpO2: 98% 97% 97%   temp 97.7  General appearance: morbidly obese, slowed mentation, uncooperative and easily arousable Head: Normocephalic, without obvious abnormality, atraumatic Eyes: conjunctivae/corneas clear. PERRL, EOM's intact.  Neck: no adenopathy, no carotid bruit, no JVD, supple, symmetrical, trachea midline and thyroid not enlarged, symmetric, no tenderness/mass/nodules Resp: apneic spells noted. decreased air entry at bases. no crackles Cardio: regular rate and rhythm, S1, S2 normal, no murmur, click, rub or gallop GI: soft, non-tender; bowel sounds normal; no masses,  no organomegaly Extremities: extremities normal, atraumatic, no cyanosis or edema Pulses: 2+ and symmetric Skin: Skin color, texture, turgor normal. No rashes or lesions Lymph nodes: Cervical, supraclavicular, and axillary nodes normal. Neurologic: arousable. Able to move right leg. Paralyzed on left.  Laboratory Data: Results for orders placed during the hospital encounter of 07/27/12 (from the past 48 hour(s))  LACTIC ACID, PLASMA     Status: None   Collection Time    07/27/12  3:04 PM      Result Value Range   Lactic Acid, Venous 1.7  0.5 - 2.2 mmol/L  BASIC METABOLIC PANEL     Status: Abnormal   Collection Time    07/27/12  3:35 PM      Result Value Range   Sodium 145  135 - 145 mEq/L   Potassium 4.6  3.5 - 5.1 mEq/L   Chloride 104  96 - 112 mEq/L   CO2 33 (*) 19 - 32 mEq/L   Glucose, Bld 123 (*) 70 - 99 mg/dL   BUN 22  6 - 23 mg/dL   Creatinine, Ser 1.61  0.50 - 1.10 mg/dL   Calcium 9.5  8.4 - 09.6 mg/dL   GFR calc non Af Amer 59 (*) >90 mL/min   GFR calc Af Amer 68 (*) >90 mL/min   Comment:            The eGFR has been calculated     using the CKD EPI equation.     This  calculation has not been     validated in all clinical     situations.     eGFR's persistently     <90 mL/min signify     possible Chronic Kidney Disease.  CBC WITH DIFFERENTIAL     Status: Abnormal   Collection Time    07/27/12  3:35 PM      Result Value Range   WBC 6.7  4.0 - 10.5 K/uL   RBC 3.49 (*) 3.87 - 5.11 MIL/uL   Hemoglobin 10.6 (*) 12.0 - 15.0 g/dL   HCT 04.5 (*) 40.9 - 81.1 %  MCV 98.3  78.0 - 100.0 fL   MCH 30.4  26.0 - 34.0 pg   MCHC 30.9  30.0 - 36.0 g/dL   RDW 04.5 (*) 40.9 - 81.1 %   Platelets 142 (*) 150 - 400 K/uL   Neutrophils Relative % 72  43 - 77 %   Neutro Abs 4.8  1.7 - 7.7 K/uL   Lymphocytes Relative 19  12 - 46 %   Lymphs Abs 1.3  0.7 - 4.0 K/uL   Monocytes Relative 8  3 - 12 %   Monocytes Absolute 0.5  0.1 - 1.0 K/uL   Eosinophils Relative 1  0 - 5 %   Eosinophils Absolute 0.1  0.0 - 0.7 K/uL   Basophils Relative 0  0 - 1 %   Basophils Absolute 0.0  0.0 - 0.1 K/uL  TROPONIN I     Status: None   Collection Time    07/27/12  3:35 PM      Result Value Range   Troponin I <0.30  <0.30 ng/mL   Comment:            Due to the release kinetics of cTnI,     a negative result within the first hours     of the onset of symptoms does not rule out     myocardial infarction with certainty.     If myocardial infarction is still suspected,     repeat the test at appropriate intervals.  VALPROIC ACID LEVEL     Status: None   Collection Time    07/27/12  3:35 PM      Result Value Range   Valproic Acid Lvl 60.6  50.0 - 100.0 ug/mL  BLOOD GAS, ARTERIAL     Status: Abnormal   Collection Time    07/27/12  4:05 PM      Result Value Range   O2 Content 4.0     Delivery systems NASAL CANNULA     pH, Arterial 7.317 (*) 7.350 - 7.450   pCO2 arterial 57.2 (*) 35.0 - 45.0 mmHg   Comment: CRITICAL RESULT CALLED TO, READ BACK BY AND VERIFIED WITH:     CRYSTAL BLACKBURN,RN BY PEVIANY LAWSON,RRT ON 07/27/2012 AT 1617.   pO2, Arterial 108.0 (*) 80.0 - 100.0 mmHg    Bicarbonate 28.4 (*) 20.0 - 24.0 mEq/L   TCO2 26.9  0 - 100 mmol/L   Acid-Base Excess 2.8 (*) 0.0 - 2.0 mmol/L   O2 Saturation 98.5     Collection site BRACHIAL ARTERY     Drawn by [id number]     Sample type ARTERIAL     Allens test (pass/fail) NOT INDICATED (*) PASS    Radiology Reports: Dg Chest Port 1 View  07/27/2012   *RADIOLOGY REPORT*  Clinical Data: Drug overdose.  PORTABLE CHEST - 1 VIEW  Comparison: Chest x-ray 04/23/2010.  Findings: Lung volumes are exceedingly low, which limits the diagnostic sensitivity and specificity of this examination.  With these limitations in mind, there are some linear bibasilar opacities which are favored to represent subsegmental atelectasis. No definite acute consolidative airspace disease.  No definite pleural effusions.  No evidence of pulmonary edema.  Heart size appears borderline to mildly enlarged.  Mediastinal contours are distorted by patient positioning.  IMPRESSION: 1.  Low lung volumes with probable bibasilar subsegmental atelectasis.   Original Report Authenticated By: Trudie Reed, M.D.    Electrocardiogram: Sinus rhythm at 86 beats per minute. Left axis deviation is noted. No Q waves.  No concerning ST or T-wave changes are seen.  Problem List  Principal Problem:   Narcotic overdose Active Problems:   Acute respiratory failure   Morbid obesity   History of stroke   Chronic pain disorder   Assessment: This is a 57 year old, African American female, who is moderately obese, who presents with unresponsiveness. This is most likely due to narcotic overdose, which is unintentional. Her narcotic medication was changed yesterday from fentanyl to morphine. This is most likely the reason for her presentation. She's currently requiring Narcan infusion and will be admitted to the intensive care unit  Plan: #1 narcotic overdose compounded by baclofen: Continue with Narcan infusion. Have discussed with our pharmacist, as well as with the poison  Center. We will currently continue the 4 mg per hour of Narcan. If needed higher infusion rates will be utilized. Continue with BiPAP for now. [employer] technician has reduced the dose of morphine and baclofen to the lowest possible dose.  #2 acute respiratory failure: Most likely due to the above. Continue with BiPAP as she is responding. ABG will be repeated. If she continues to have apneic spells and her respiratory status does not improve with increasing dose of Narcan, intubation may be required although it should be avoided due to her chronic respiratory failure, and history of central sleep apnea.  #3 history of a seizure disorder: Depakote level was normal. The level will be repeated in the morning. Continue with Keppra intravenously at the lower dose. Resume home medication regimen as soon as she is able to take orally.  #4 history of hemorrhagic stroke in the past: Appears to be stable. She does not have any new neurological deficits and she is arousable easily. So, I do not suspect any intracranial events. There's been no history of fall.  #5 history of sleep apnea: As above. Continue with BiPAP for now.  #6 history of hypertension: Hold her antihypertensive agents for now.   DVT Prophylaxis: SCDs Code Status: Discussed with the family and she is a full code Family Communication: Discussed with her husband and her daughter  Disposition Plan: Admit to ICU   Further management decisions will depend on results of further testing and patient's response to treatment.  Critical care time: 1 hour  [facility]  [facility] Pager [phone]  If 7PM-7AM, please contact night-coverage [email or web address] Password [facility]  07/27/2012, 8:00 PM

## 2012-07-27 NOTE — ED Notes (Signed)
Respirations improved after narcan and pt arousable

## 2012-07-27 NOTE — ED Notes (Signed)
[employer] Specialist here reducing patient's internal pump medication. Patient receiving Baclofen 871mcg/day and morphine 11.6mg /day at this time. Recommended dose by [employer] Baclofen 225mcg/day and morphine 2.9mg /day. Patient's Baclofen reduced to 199.79mcg/day and morphine 2.818mg /day. Patient now reccommended to have Baclofen 10mg  PO TID when tolerated. Dr Clarene Duke aware and in room.

## 2012-07-27 NOTE — ED Notes (Signed)
Patient having more frequent and longer periods of apnea. Family at bedside trying to coax patient to stay awake and take deep breaths. Dr Clarene Duke aware.

## 2012-07-27 NOTE — Progress Notes (Signed)
PCO2 72.2 on ABG draw Notified by RT at 2130. Pt placed back on BiPAP at 2142.  MD notified at 2138 via text page.

## 2012-07-27 NOTE — ED Notes (Signed)
Resp in room with patient drawing ABG, aware of ordered CPAP.

## 2012-07-27 NOTE — Progress Notes (Signed)
RT called house RT at [facility]). I explained the Pt's condition. Pt was put on servo I NIV/PS and she wasn't initiating any breaths unless we stimulated her. RT sat with PT from 4:30 til 515. Rt then was called to look at the Pt and she was still not initiating breath. I asked Fayrene Fearing about the BI VEnt and he stated not to use Bi/Vent unless Pt was intubated. I had tried NIV/P and NIV/PC, and was still having issues with her RR. I tried NIV/PC again with a higher RR and the PT responded to this. The Pt is now on NIV/PC and is still very sleepy but will arouse when stimulated but RT is still concerned with her respiratory status.

## 2012-07-27 NOTE — ED Notes (Signed)
[employer] called to have internal morphine pump turned off. [employer] staff to call back.

## 2012-07-27 NOTE — ED Provider Notes (Signed)
History     CSN: [id number]  Arrival date & time 07/27/12  1402   First MD Initiated Contact with Patient 07/27/12 1404      Chief Complaint  Patient presents with  . Drug Overdose     Patient is a 57 y.o. female presenting with Overdose. The history is provided by the EMS personnel and the nursing home. The history is limited by the condition of the patient (AMS).  Drug Overdose  Pt was seen at 1410.  Per EMS and NH report, pt with AMS after being given her oxycodone today.  Pt with significant hx of chronic pain with "implanted pain pump," takes oxycodone PO for breakthrough pain.  NH states they gave pt her usual dose of oxycodone today, and a short time later when they checked on her, found her unresponsive with agonal respirations.  NH attempted to start IV and give narcan without effect.  EMS states on their arrival, pt was having periods of apnea and the NH IV was dislodged.  EMS started another IV, and gave narcan with good effect.  Pt remained awake/alert with easy resps during her transport to the ED.  No reported asystole, no seizure activity.    Past Medical History  Diagnosis Date  . Hypertension   . Seizure disorder   . Metabolic encephalopathy 05/06/2010  . Respiratory failure 05/06/10  . UTI (lower urinary tract infection) 05/06/10  . 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 Sleep apnea 04/2010    on CPAP, "severe central sleep apnea"  . 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 Chronic pain   . Depression   . 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 Spasticity     chronic  . Diabetes mellitus without complication     Past Surgical History  Procedure Laterality Date  . Intrathecal pump implantation  2010    [employer]:  fentanyl, baclofen changed to morphine and baclofen on 07/2012  . Cerebral aneurysm repair  1999     History  Substance Use Topics  . Smoking status: Never Smoker   . Smokeless tobacco: Not on file  . Alcohol Use: No       Review of Systems  Unable to perform ROS: Mental status change    Allergies  Codeine  Home Medications   Current Outpatient Rx  Name  Route  Sig  Dispense  Refill  . cloNIDine (CATAPRES) 0.1 MG tablet   Oral   Take 0.1 mg by mouth every 6 (six) hours as needed. SBP>160         . divalproex (DEPAKOTE) 500 MG DR tablet   Oral   Take 500 mg by mouth 2 (two) times daily.         . hydrOXYzine (ATARAX/VISTARIL) 25 MG tablet   Oral   Take 12.5 mg by mouth daily as needed for itching or anxiety.         Marland Kitchen labetalol (NORMODYNE) 200 MG tablet   Oral   Take 200 mg by mouth 2 (two) times daily.         Marland Kitchen levETIRAcetam (KEPPRA) 1000 MG tablet   Oral   Take 2,000 mg by mouth 2 (two) times daily.         Marland Kitchen levothyroxine (SYNTHROID, LEVOTHROID) 137 MCG tablet   Oral   Take 68.5 mcg by mouth daily.         Marland Kitchen losartan (COZAAR) 100 MG tablet   Oral  Take 100 mg by mouth at bedtime.         Marland Kitchen oxyCODONE (OXY IR/ROXICODONE) 5 MG immediate release tablet   Oral   Take 5 mg by mouth every 8 (eight) hours as needed for pain. Moderate to Severe pain         . simvastatin (ZOCOR) 20 MG tablet   Oral   Take 20 mg by mouth at bedtime.         Marland Kitchen zolpidem (AMBIEN) 5 MG tablet   Oral   Take 2.5 mg by mouth at bedtime as needed for sleep.           BP 88/65  Pulse 79  Temp(Src) 97.7 F (36.5 C) (Oral)  Resp 9  Wt 300 lb (136.079 kg)  BMI 56.71 kg/m2  SpO2 96%  Physical Exam 1415: Physical examination:  Nursing notes reviewed; Vital signs and O2 SAT reviewed;  Constitutional: Well developed, Well nourished, In no acute distress; Head:  Normocephalic, atraumatic; Eyes: EOMI, PERRL, No scleral icterus; ENMT: Mouth and pharynx normal, Mucous membranes dry; Neck: Supple, Full range of motion, No lymphadenopathy; Cardiovascular: Regular rate and rhythm, No gallop; Respiratory: Breath sounds clear & equal bilaterally, No wheezes.  Speaking full sentences with  ease, Normal respiratory effort/excursion; Chest: Nontender, Movement normal; Abdomen: Soft, Nontender, Nondistended, Normal bowel sounds; Genitourinary: No CVA tenderness; Extremities: Pulses normal, No tenderness, No edema, No calf edema or asymmetry.; Neuro: Lethargic, but easily arousable, opens eyes to name and talks to ED staff.  Major CN grossly intact. Speech clear. +left sided weakness per hx previous CVA otherwise moves all ext on stretcher spontaneously. +known tremor RUE.; Skin: Color normal, Warm, Dry.   ED Course  Procedures   1420:  Shortly after arrival to the ED, pt became increasingly lethargic and unresponsive with periods of apnea.  IV narcan 1mg  given with immediate improvement: pt awake/alert, increasing resp rate/easy resps, moves all ext on stretcher spontaneously.  BP stable.  No hx of falling/head trauma, no new focal motor weakness, and pt awakens after IV narcan; no need for CT head at this time.  AMS began directly after receiving narcotic pain meds at [facility].  Also has narcotic pain meds via continuous "pain pump."  ED RN to call NH to find out more info regarding same.  Workup ordered.  1500:  Pt again with decreasing mental status, IV narcan bolus given.  Will start narcan drip.  No information given to ED by NH regarding pt's "pain pump;" family apparently is on their way to the ED with info.   1540:  Pt's family has arrived with information regarding pt's pain pump: [employer], meds changed yesterday from fentanyl/baclofen to morphine/baclofen, morphine is 11.6 mg/day, baclofen 825 mcg/day.  [employer] rep called and is to come to ED to lower doses of both.  IV narcan drip infusing at 1mg /hour with pt continuing to have periods of apnea.  BP and HR stable, and pt does awaken to her name and talk with ED staff and family at bedside. Resps without distress.  Will increase narcan drip to 2mg /hr.  Family updated regarding plan of care, questions answered.   1600:  Pt with  elevated CO2, but has hx of same (see below). Sats will occasionally drop to high 80's on N/C O2.  Will start bipap while awaiting for [employer] staff to decrease pump meds.  1650:  Pt continues to have periods of apnea and lethargy, SBP dropping into 90's and 80's.  IVF bolus  given, IV narcan re-bolused, gtt mg/hr increased.  [employer] rep here in the ED: has instructions from her Pain Management MD to decrease pump meds, he will change them now.    1710:  Settings changed to:  Baclofen 199.8 mcg/day, morphine 2.8 mg/day.  Pt already starting to awaken, resps rate and depth increasing. No resp distress.  SBP improving after IVF bolus and increase narcan gtt to 4 mg/hr.  Will continue to monitor until admission.  Family updated regarding plan of care, questions answered.     1825:  Pt's SBP improving to 110's, HR 80's, Sats 98% on bipap.  Pt lethargic, but awakens easily to her name and light touch. Opens her eyes and talks with ED staff and family, A&O. Identifies everyone in room to me by their proper names and relationship to her.  Verb understanding to current plan of care (reversing her narcotics).  Family updated, questions answered.  T/C to [facility] Dr. Sherrie Mustache, case discussed, including:  HPI, pertinent PM/SHx, VS/PE, dx testing, ED course and treatment:  Agreeable to admit, requests to write temporary orders, obtain ICU bed to team 1.  1920:  Pt continues in the ED while awaiting ICU bed, on Bipap.  Sats 97%, resps without distress. SBP now in 110-120's, HR 70-80's, NSR. Pt's family at bedside feels she is "talking with [place] more" and "is getting more awake."  Pt easily awakens to her name and light touch, opens her eyes and speaks with ED staff and family, A&O.  Will continue to monitor until ICU bed ready.  2015:  Pt to be moved to ICU.  IV narcan gtt continues.  Pt assessment unchanged.  VS remain stable, Sats 98% on bipap, NAD.    MDM  MDM Reviewed: previous chart, nursing note and  vitals Reviewed previous: labs and ECG Interpretation: labs, ECG and x-ray Total time providing critical care: 75-105 minutes. This excludes time spent performing separately reportable procedures and services. Consults: admitting MD   CRITICAL CARE Performed by: Laray Anger Total critical care time: 90 Critical care time was exclusive of separately billable procedures and treating other patients. Critical care was necessary to treat or prevent imminent or life-threatening deterioration. Critical care was time spent personally by me on the following activities: development of treatment plan with patient and/or surrogate as well as nursing, discussions with consultants, evaluation of patient's response to treatment, examination of patient, obtaining history from patient or surrogate, ordering and performing treatments and interventions, ordering and review of laboratory studies, ordering and review of radiographic studies, pulse oximetry and re-evaluation of patient's condition.    Date: 07/27/2012  Rate: 86  Rhythm: normal sinus rhythm  QRS Axis: left  Intervals: PR prolonged  ST/T Wave abnormalities: normal, artifact  Conduction Disutrbances:first-degree A-V block   Narrative Interpretation:   Old EKG Reviewed: unchanged; no significant changes from previous EKG dated 03/08/2010.  Results for orders placed during the hospital encounter of 07/27/12  BASIC METABOLIC PANEL      Result Value Range   Sodium 145  135 - 145 mEq/L   Potassium 4.6  3.5 - 5.1 mEq/L   Chloride 104  96 - 112 mEq/L   CO2 33 (*) 19 - 32 mEq/L   Glucose, Bld 123 (*) 70 - 99 mg/dL   BUN 22  6 - 23 mg/dL   Creatinine, Ser 1.61  0.50 - 1.10 mg/dL   Calcium 9.5  8.4 - 09.6 mg/dL   GFR calc non Af Amer 59 (*) >90 mL/min  GFR calc Af Amer 68 (*) >90 mL/min  CBC WITH DIFFERENTIAL      Result Value Range   WBC 6.7  4.0 - 10.5 K/uL   RBC 3.49 (*) 3.87 - 5.11 MIL/uL   Hemoglobin 10.6 (*) 12.0 - 15.0 g/dL    HCT 29.5 (*) 28.4 - 46.0 %   MCV 98.3  78.0 - 100.0 fL   MCH 30.4  26.0 - 34.0 pg   MCHC 30.9  30.0 - 36.0 g/dL   RDW 13.2 (*) 44.0 - 10.2 %   Platelets 142 (*) 150 - 400 K/uL   Neutrophils Relative % 72  43 - 77 %   Neutro Abs 4.8  1.7 - 7.7 K/uL   Lymphocytes Relative 19  12 - 46 %   Lymphs Abs 1.3  0.7 - 4.0 K/uL   Monocytes Relative 8  3 - 12 %   Monocytes Absolute 0.5  0.1 - 1.0 K/uL   Eosinophils Relative 1  0 - 5 %   Eosinophils Absolute 0.1  0.0 - 0.7 K/uL   Basophils Relative 0  0 - 1 %   Basophils Absolute 0.0  0.0 - 0.1 K/uL  TROPONIN I      Result Value Range   Troponin I <0.30  <0.30 ng/mL  LACTIC ACID, PLASMA      Result Value Range   Lactic Acid, Venous 1.7  0.5 - 2.2 mmol/L  VALPROIC ACID LEVEL      Result Value Range   Valproic Acid Lvl 60.6  50.0 - 100.0 ug/mL  BLOOD GAS, ARTERIAL      Result Value Range   O2 Content 4.0     Delivery systems NASAL CANNULA     pH, Arterial 7.317 (*) 7.350 - 7.450   pCO2 arterial 57.2 (*) 35.0 - 45.0 mmHg   pO2, Arterial 108.0 (*) 80.0 - 100.0 mmHg   Bicarbonate 28.4 (*) 20.0 - 24.0 mEq/L   TCO2 26.9  0 - 100 mmol/L   Acid-Base Excess 2.8 (*) 0.0 - 2.0 mmol/L   O2 Saturation 98.5     Collection site BRACHIAL ARTERY     Drawn by [id number]     Sample type ARTERIAL     Allens test (pass/fail) NOT INDICATED (*) PASS   Dg Chest Port 1 View 07/27/2012   *RADIOLOGY REPORT*  Clinical Data: Drug overdose.  PORTABLE CHEST - 1 VIEW  Comparison: Chest x-ray 04/23/2010.  Findings: Lung volumes are exceedingly low, which limits the diagnostic sensitivity and specificity of this examination.  With these limitations in mind, there are some linear bibasilar opacities which are favored to represent subsegmental atelectasis. No definite acute consolidative airspace disease.  No definite pleural effusions.  No evidence of pulmonary edema.  Heart size appears borderline to mildly enlarged.  Mediastinal contours are distorted by patient positioning.   IMPRESSION: 1.  Low lung volumes with probable bibasilar subsegmental atelectasis.   Original Report Authenticated By: Trudie Reed, M.D.    Results for EMONI, WHITWORTH (MRN [id number]) as of 07/27/2012 17:09  Ref. Range 05/02/2010 04:53  Sample type No range found ARTERIAL  Delivery systems No range found BILEVEL POSITIVE AIRWAY PRESSURE  FIO2 No range found 0.30  Inspiratory PAP No range found 14  Expiratory PAP No range found 6  pH, Arterial Latest Range: 7.350-7.450  7.358  pCO2 arterial Latest Range: 35.0-45.0 mmHg 64.8 CRITICAL RESULT CALLED TO, READ BACK BY AND VERIFIED WITH:  MARIAM ANARBOGAST, RN AT 0500 BY DOUGLAS THOMPSON,  RCP, CRT ON 05/02/2009 (HH)  pO2, Arterial Latest Range: 80.0-100.0 mmHg 83.9  Bicarbonate Latest Range: 20.0-24.0 mEq/L 35.7 (H)  TCO2 Latest Range: 0-100 mmol/L 37.7  Acid-Base Excess Latest Range: 0.0-2.0 mmol/L 10.0 (H)  O2 Saturation No range found 96.1  Patient temperature No range found 98.3  Collection site No range found LEFT BRACHIAL  Allens test (pass/fail) Latest Range: PASS  PASS    Results for SHAUNITA, SENEY (MRN [id number]) as of 07/27/2012 17:09  Ref. Range 04/30/2010 11:45 05/02/2010 05:40 05/03/2010 05:00 05/05/2010 08:59 07/27/2012 15:35  Hemoglobin Latest Range: 12.0-15.0 g/dL 86.5 (L) 78.4 (L) 69.6 (L) 10.6 (L) 10.6 (L)  HCT Latest Range: 36.0-46.0 % 36.0 33.1 (L) 33.2 (L) 34.0 (L) 34.3 (L)  Platelets Latest Range: 150-400 K/uL 143 (L) 125 (L) 129 (L) 100 (L) 142 (L)        Laray Anger, DO 07/29/12 1851

## 2012-07-27 NOTE — Progress Notes (Signed)
Pt placed on BIPAP at 1640 with very little pt effort. Pt is on a narcan drip to try to wake her up. She has a morphine pump . [employer] was called to reduce the morphine pump.

## 2012-07-27 NOTE — ED Notes (Signed)
Pt from [facility] via EMS for overdose. Nursing home staff advises that pt has internal morphine pump and has been given OxyContin per EMS. EMS states that upon arrival pt had agonal respirations IV started and 1 mg narcan given. Pt then began to respond and respirations improved.

## 2012-07-27 NOTE — ED Notes (Signed)
Dr. Rito Ehrlich in with patient for evaluation

## 2012-07-27 NOTE — ED Notes (Signed)
Pt began to have periods of apnea. 1mg  IV narcan given

## 2012-07-27 NOTE — ED Notes (Addendum)
CRITICAL VALUE ALERT  Critical value received:  CO2 57.2  Date of notification:  07/27/2012   Time of notification:  1612  Critical value read back:yes  Nurse who received alert:  Tarri Glenn RN   MD notified (1st page):  Dr Clarene Duke  Time of first page:  1618  MD notified (2nd page):  Time of second page:  Responding MD:  Dr Clarene Duke  Time MD responded:  [phone]

## 2012-07-28 ENCOUNTER — Inpatient Hospital Stay (HOSPITAL_COMMUNITY)
Admit: 2012-07-28 | Discharge: 2012-07-28 | Disposition: A | Discharge status: Home / Self Care | Payer: PRIVATE HEALTH INSURANCE | Attending: Internal Medicine | Admitting: Internal Medicine

## 2012-07-28 ENCOUNTER — Encounter (HOSPITAL_COMMUNITY): Payer: Self-pay | Admitting: Internal Medicine

## 2012-07-28 DIAGNOSIS — G4731 Primary central sleep apnea: Secondary | ICD-10-CM | POA: Diagnosis present

## 2012-07-28 DIAGNOSIS — D696 Thrombocytopenia, unspecified: Secondary | ICD-10-CM | POA: Diagnosis present

## 2012-07-28 DIAGNOSIS — R0689 Other abnormalities of breathing: Secondary | ICD-10-CM | POA: Diagnosis present

## 2012-07-28 DIAGNOSIS — G40909 Epilepsy, unspecified, not intractable, without status epilepticus: Secondary | ICD-10-CM | POA: Diagnosis present

## 2012-07-28 DIAGNOSIS — E039 Hypothyroidism, unspecified: Secondary | ICD-10-CM | POA: Diagnosis present

## 2012-07-28 DIAGNOSIS — I69354 Hemiplegia and hemiparesis following cerebral infarction affecting left non-dominant side: Secondary | ICD-10-CM

## 2012-07-28 DIAGNOSIS — T50901A Poisoning by unspecified drugs, medicaments and biological substances, accidental (unintentional), initial encounter: Secondary | ICD-10-CM

## 2012-07-28 LAB — COMPREHENSIVE METABOLIC PANEL
AST: 20 U/L (ref 0–37)
BUN: 16 mg/dL (ref 6–23)
CO2: 32 mEq/L (ref 19–32)
Calcium: 8.9 mg/dL (ref 8.4–10.5)
Chloride: 105 mEq/L (ref 96–112)
Creatinine, Ser: 0.63 mg/dL (ref 0.50–1.10)
GFR calc Af Amer: >90 mL/min (ref >90)
GFR calc non Af Amer: >90 mL/min (ref >90)
Total Bilirubin: 0.2 mg/dL — ABNORMAL LOW (ref 0.3–1.2)

## 2012-07-28 LAB — BLOOD GAS, ARTERIAL
Acid-Base Excess: 4 mmol/L — ABNORMAL HIGH (ref 0.0–2.0)
Acid-Base Excess: 3.7 mmol/L — ABNORMAL HIGH (ref 0.0–2.0)
Delivery systems: POSITIVE
Drawn by: 25788
O2 Content: 4 L/min
Patient temperature: 37
TCO2: 28.6 mmol/L (ref 0–100)
TCO2: 28 mmol/L (ref 0–100)
pCO2 arterial: 66.7 mmHg (ref 35.0–45.0)
pCO2 arterial: 63 mmHg (ref 35.0–45.0)
pH, Arterial: 7.28 — ABNORMAL LOW (ref 7.350–7.450)
pH, Arterial: 7.296 — ABNORMAL LOW (ref 7.350–7.450)
pO2, Arterial: 80.4 mmHg (ref 80.0–100.0)

## 2012-07-28 LAB — CBC
Hemoglobin: 11.1 g/dL — ABNORMAL LOW (ref 12.0–15.0)
MCH: 29.5 pg (ref 26.0–34.0)
Platelets: 123 10*3/uL — ABNORMAL LOW (ref 150–400)
RBC: 3.76 MIL/uL — ABNORMAL LOW (ref 3.87–5.11)
WBC: 5.6 10*3/uL (ref 4.0–10.5)

## 2012-07-28 LAB — MAGNESIUM: Magnesium: 1.8 mg/dL (ref 1.5–2.5)

## 2012-07-28 LAB — VALPROIC ACID LEVEL: Valproic Acid Lvl: 50.1 ug/mL (ref 50.0–100.0)

## 2012-07-28 LAB — T4, FREE: Free T4: 1.21 ng/dL (ref 0.80–1.80)

## 2012-07-28 MED ORDER — POTASSIUM CHLORIDE IN NACL 20-0.9 MEQ/L-% IV SOLN
INTRAVENOUS | Status: DC
  Administered 2012-07-28 – 2012-07-31 (×6): via INTRAVENOUS

## 2012-07-28 MED ORDER — NALOXONE HCL 1 MG/ML IJ SOLN
INTRAMUSCULAR | Status: AC
  Filled 2012-07-28: qty 4

## 2012-07-28 MED ORDER — VALPROATE SODIUM 500 MG/5ML IV SOLN
500.0000 mg | Freq: Two times a day (BID) | INTRAVENOUS | Status: DC
  Administered 2012-07-28 (×2): 500 mg via INTRAVENOUS
  Filled 2012-07-28 (×3): qty 5

## 2012-07-28 NOTE — Clinical Social Work Psychosocial (Signed)
    Clinical Social Work Department BRIEF PSYCHOSOCIAL ASSESSMENT 07/28/2012  Patient:  [name], [name]     Account Number:  [id number]     Admit date:  07/27/2012  Clinical Social Worker:  Santa Genera, CLINICAL SOCIAL WORKER  Date/Time:  07/28/2012 10:00 AM  Referred by:  CSW  Date Referred:  07/28/2012 Referred for  SNF Placement   Other Referral:   Interview type:  Family Other interview type:   Patient on Bipap, unable to actively participate in assessment.  Facility admissions contacted    PSYCHOSOCIAL DATA Living Status:  FACILITY Admitted from facility:  [facility] Level of care:  Skilled Nursing Facility Primary support name:  Christel Mormon Primary support relationship to patient:  CHILD, ADULT Degree of support available:   Significant family involvement, placed at SNF    CURRENT CONCERNS Current Concerns  Post-Acute Placement   Other Concerns:    SOCIAL WORK ASSESSMENT / PLAN CSW met w patient at bedside, patient on bipap and indicated that CSW should talk w daughter in room.  Spoke w daughter, says patient has been at [facility] since 2008; had stroke while at work at [employer].  Placed at SNF for rehab post stroke, remains there since 2008.  Now on Medicaid for placement and receives no PT rehab services, is on restorative services at facility.  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  Is able to feed self but requires assistance w all other ADLs.    Patient has chronic pain, has oxycodone managed by Dr Welton Flakes who visits facility once/month.  Her pain pump is managed by Dr Quentin Mulling, patient is transported to his office monthly where MD refills her pain pump.    Family agreeable to patient returning to [facility], patient has significant family support at facility. Daughter works at [employer], husband and sister visit frequently. As former employee, patient knows many staff members. Facility is also willing to have patient return at discharge.     CSW will continue to work w patient, family and facility to coordinate discharge arrangements and return to facility.   Assessment/plan status:  Psychosocial Support/Ongoing Assessment of Needs Other assessment/ plan:   Information/referral to community resources:   None needed at this time.    PATIENT'S/FAMILY'S RESPONSE TO PLAN OF CARE: Family would like to speak w MD regarding patient's pain medicine, MD notified.     Santa Genera, LCSW Clinical Social Worker [phone])

## 2012-07-28 NOTE — Progress Notes (Signed)
Narcan gtt d/c per MD orders.

## 2012-07-28 NOTE — Progress Notes (Signed)
Subjective: The patient is lying in bed. She is alert and awake. She denies shortness of breath or chest pain. She has some pain in her right foot. Nursing reports a short run of V. tach this morning.  Objective: Vital signs in last 24 hours: Filed Vitals:   07/28/12 0400 07/28/12 0500 07/28/12 0600 07/28/12 0742  BP: 108/69 115/76 114/73   Pulse:      Temp: 98 F (36.7 C)   99.4 F (37.4 C)  TempSrc: Axillary   Oral  Resp: 0 4 5   Height:      Weight:  108.2 kg (238 lb 8.6 oz)    SpO2:        Intake/Output Summary (Last 24 hours) at 07/28/12 1610 Last data filed at 07/28/12 0816  Gross per 24 hour  Intake 2870.05 ml  Output   1100 ml  Net 1770.05 ml    Weight change:   Physical exam: General: 57 year old obese African American woman laying in bed, in no acute distress. Lungs: Clear anteriorly with decreased breath sounds in the bases. Heart: S1, S2, with no murmurs rubs or gallops. Abdomen: Obese, positive bowel sounds, soft, nontender, nondistended. Extremities: Trace of pedal edema bilaterally. Mild tenderness over the right foot. No acute hot red joints. Neurologic: She has a mild to moderate tremor of her right arm. She is unable to move her left arm and her left leg. She is alert and oriented to herself and hospital. Her speech is clear.  Lab Results: Basic Metabolic Panel:  Recent Labs  96/04/54 1535 07/28/12 0559 07/28/12 0830  NA 145 143  --   K 4.6 4.2  --   CL 104 105  --   CO2 33* 32  --   GLUCOSE 123* 85  --   BUN 22 16  --   CREATININE 1.04 0.63  --   CALCIUM 9.5 8.9  --   MG  --   --  1.8   Liver Function Tests:  Recent Labs  07/28/12 0559  AST 20  ALT 6  ALKPHOS 41  BILITOT 0.2*  PROT 7.0  ALBUMIN 3.1*   No results found for this basename: LIPASE, AMYLASE,  in the last 72 hours No results found for this basename: AMMONIA,  in the last 72 hours CBC:  Recent Labs  07/27/12 1535 07/28/12 0559  WBC 6.7 5.6  NEUTROABS 4.8  --    HGB 10.6* 11.1*  HCT 34.3* 36.7  MCV 98.3 97.6  PLT 142* 123*   Cardiac Enzymes:  Recent Labs  07/27/12 1535  TROPONINI <0.30   BNP: No results found for this basename: PROBNP,  in the last 72 hours D-Dimer: No results found for this basename: DDIMER,  in the last 72 hours CBG: No results found for this basename: GLUCAP,  in the last 72 hours Hemoglobin A1C: No results found for this basename: HGBA1C,  in the last 72 hours Fasting Lipid Panel: No results found for this basename: CHOL, HDL, LDLCALC, TRIG, CHOLHDL, LDLDIRECT,  in the last 72 hours Thyroid Function Tests: No results found for this basename: TSH, T4TOTAL, FREET4, T3FREE, THYROIDAB,  in the last 72 hours Anemia Panel: No results found for this basename: VITAMINB12, FOLATE, FERRITIN, TIBC, IRON, RETICCTPCT,  in the last 72 hours Coagulation: No results found for this basename: LABPROT, INR,  in the last 72 hours Urine Drug Screen: Drugs of Abuse     Component Value Date/Time   LABOPIA NONE DETECTED 04/23/2010 0008  COCAINSCRNUR NONE DETECTED 04/23/2010 0008   LABBENZ NONE DETECTED 04/23/2010 0008   AMPHETMU NONE DETECTED 04/23/2010 0008   THCU NONE DETECTED 04/23/2010 0008   LABBARB  Value: NONE DETECTED        DRUG SCREEN FOR MEDICAL PURPOSES ONLY.  IF CONFIRMATION IS NEEDED FOR ANY PURPOSE, NOTIFY LAB WITHIN 5 DAYS.        LOWEST DETECTABLE LIMITS FOR URINE DRUG SCREEN Drug Class       Cutoff (ng/mL) Amphetamine      1000 Barbiturate      200 Benzodiazepine   200 Tricyclics       300 Opiates          300 Cocaine          300 THC              50 04/23/2010 0008    Alcohol Level: No results found for this basename: ETH,  in the last 72 hours Urinalysis: No results found for this basename: COLORURINE, APPERANCEUR, LABSPEC, PHURINE, GLUCOSEU, HGBUR, BILIRUBINUR, KETONESUR, PROTEINUR, UROBILINOGEN, NITRITE, LEUKOCYTESUR,  in the last 72 hours Misc. Labs:  ABG: 07/28/2012:     7.28/67/80.4 (on  BiPAP)   Micro: Recent Results (from the past 240 hour(s))  MRSA PCR SCREENING     Status: None   Collection Time    07/27/12  8:32 PM      Result Value Range Status   MRSA by PCR NEGATIVE  NEGATIVE Final   Comment:            The GeneXpert MRSA Assay (FDA     approved for NASAL specimens     only), is one component of a     comprehensive MRSA colonization     surveillance program. It is not     intended to diagnose MRSA     infection nor to guide or     monitor treatment for     MRSA infections.    Studies/Results: Dg Chest Port 1 View  07/27/2012   *RADIOLOGY REPORT*  Clinical Data: Drug overdose.  PORTABLE CHEST - 1 VIEW  Comparison: Chest x-ray 04/23/2010.  Findings: Lung volumes are exceedingly low, which limits the diagnostic sensitivity and specificity of this examination.  With these limitations in mind, there are some linear bibasilar opacities which are favored to represent subsegmental atelectasis. No definite acute consolidative airspace disease.  No definite pleural effusions.  No evidence of pulmonary edema.  Heart size appears borderline to mildly enlarged.  Mediastinal contours are distorted by patient positioning.  IMPRESSION: 1.  Low lung volumes with probable bibasilar subsegmental atelectasis.   Original Report Authenticated By: Trudie Reed, M.D.    Medications:  Scheduled: . [COMPLETED] sodium chloride   Intravenous STAT  . levETIRAcetam  1,000 mg Intravenous Q12H  . sodium chloride  3 mL Intravenous Q12H   Continuous: . naLOXone (NARCAN) adult infusion for OVERDOSE 0.5 mg/hr (07/28/12 0830)   WUJ:WJXBJYNWG, hydrALAZINE, ondansetron (ZOFRAN) IV, ondansetron  Assessment: Principal Problem:   Narcotic overdose Active Problems:   Acute respiratory failure   Central sleep apnea   Hypercapnia   Morbid obesity   History of stroke   Chronic pain disorder   Thrombocytopenia, unspecified   Hemiparesis affecting left side as late effect of  cerebrovascular accident   Unspecified hypothyroidism   1. Unintentional narcotic/opiate overdose compounded with baclofen. She is on a chronic baclofen/morphine pop. The Narcan drip was started at 4 mg per hour and has been titrated down to  1 mg overnight. She is more alert. The [employer] technician has reduced the dose of morphine and baclofen to the lowest possible dose. If she has worsening pain, we'll provide when necessary oral or IV morphine rather than titrating up the infusion in the pump.  Acute respiratory failure with hypercapnia in the setting of central sleep apnea and opiate overdose. Her ABG last night worsened, but this was off of BiPAP. ABG back on BiPAP is better, but still worse than ABG on admission. We'll continue BiPAP with a short break for sips of fluids as tolerated.  Seizure disorder. We'll continue Keppra. Will restart Depakote IV. We'll order an EEG for further evaluation.  Reported short run V. tach. We'll check a magnesium level. We'll continue to monitor.  Chronic pain syndrome. As above in #1.  History of stroke with left-sided hemiparalysis/any paresis.  Hypertension. She is on clonidine, Cozaar and labetalol chronically. These are currently on hold as her blood pressure is on the low-normal side.  Thrombocytopenia. May be secondary to anticonvulsants and from the dilutional effects of IV fluids. Will assess further.  Hypothyroidism. She is on Synthroid chronically. Currently on hold while she is n.p.o.    Plan:  1. We'll continue BiPAP with when necessary breaks for clear liquids as tolerated. Will consider checking another ABG this afternoon. 2. Magnesium level ordered and is within normal limits. We'll continue to monitor the patient on telemetry. 3. Will order when necessary IV metoprolol while she is n.p.o.  4. We'll order EEG for further evaluation. 5. For further evaluation of anemia and thrombocytopenia, we'll order an anemia panel. 6. Will  restart oral medications as the patient improves clinically. 7. We'll titrate down Narcan 0.5 mg and then titrate off.    TOTAL ICU/CRITICAL CARE TIME: 45 MINUTES   LOS: 1 day   [name] 07/28/2012, 9:06 AM

## 2012-07-28 NOTE — Consult Note (Signed)
Consult requested by: [facility] hospitalist Consult requested for respiratory failure:  HPI: This is a 57 year old African American female who lives in an assisted living facility and who was found to be less responsive than usual. She apparently had a change in her continuous infusion medication for chronic pain on the day of admission and then became much less responsive and was brought to the emergency department for evaluation. She was found to have acute respiratory failure which was presumed to be related to the change in medication. She has multiple other medical problems including a previous CVA with left hemiparesis, seizure disorder and sleep apnea. No seizure activity was noted. She improved somewhat when she was given Narcan and she is currently on a Narcan continuous infusion and her infusion of pain medication and baclofen has been reduced to the minimum dose.  Past Medical History  Diagnosis Date  . Hypertension   . Seizure disorder   . Metabolic encephalopathy 05/06/2010  . Respiratory failure 05/06/10  . UTI (lower urinary tract infection) 05/06/10  . 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 Sleep apnea 04/2010    on CPAP, "severe central sleep apnea"  . 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 Chronic pain   . Depression   . 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 Spasticity     chronic  . Diabetes mellitus without complication      History reviewed. No pertinent family history.   History   Social History  . Marital Status: Married    Spouse Name: N/A    Number of Children: N/A  . Years of Education: N/A   Social History Main Topics  . Smoking status: Never Smoker   . Smokeless tobacco: None  . Alcohol Use: No  . Drug Use: No  . Sexually Active: Not Currently   Other Topics Concern  . None   Social History Narrative  . None     ROS: Not obtainable    Objective: Vital signs in last 24 hours: Temp:  [97.6 F (36.4 C)-99.4 F (37.4  C)] 99.4 F (37.4 C) (05/22 0742) Pulse Rate:  [76-167] 81 (05/22 0347) Resp:  [0-21] 5 (05/22 0600) BP: (82-125)/(52-82) 114/73 mmHg (05/22 0600) SpO2:  [80 %-100 %] 95 % (05/22 0347) Weight:  [105.8 kg (233 lb 4 oz)-136.079 kg (300 lb)] 108.2 kg (238 lb 8.6 oz) (05/22 0500) Weight change:     Intake/Output from previous day: 05/21 0701 - 05/22 0700 In: 2870.1 [I.V.:2760.1; IV Piggyback:110] Out: 1100 [Urine:1100]  PHYSICAL EXAM She will open her eyes and respond. She will occasionally answer questions yes or no her mucous membranes are moist. Her neck is supple. She does not have JVD. Her chest shows some rhonchi bilaterally. Her heart is regular without gallop. Her abdomen is soft no masses are felt. Central nervous system exam to suggest a left hemiparesis. She is on BiPAP.  Lab Results: Basic Metabolic Panel:  Recent Labs  16/10/96 1535 07/28/12 0559  NA 145 143  K 4.6 4.2  CL 104 105  CO2 33* 32  GLUCOSE 123* 85  BUN 22 16  CREATININE 1.04 0.63  CALCIUM 9.5 8.9   Liver Function Tests:  Recent Labs  07/28/12 0559  AST 20  ALT 6  ALKPHOS 41  BILITOT 0.2*  PROT 7.0  ALBUMIN 3.1*   No results found for this basename: LIPASE, AMYLASE,  in the last 72 hours No results found for this basename: AMMONIA,  in the last 72 hours CBC:  Recent Labs  07/27/12 1535 07/28/12 0559  WBC 6.7 5.6  NEUTROABS 4.8  --   HGB 10.6* 11.1*  HCT 34.3* 36.7  MCV 98.3 97.6  PLT 142* 123*   Cardiac Enzymes:  Recent Labs  07/27/12 1535  TROPONINI <0.30   BNP: No results found for this basename: PROBNP,  in the last 72 hours D-Dimer: No results found for this basename: DDIMER,  in the last 72 hours CBG: No results found for this basename: GLUCAP,  in the last 72 hours Hemoglobin A1C: No results found for this basename: HGBA1C,  in the last 72 hours Fasting Lipid Panel: No results found for this basename: CHOL, HDL, LDLCALC, TRIG, CHOLHDL, LDLDIRECT,  in the last 72  hours Thyroid Function Tests: No results found for this basename: TSH, T4TOTAL, FREET4, T3FREE, THYROIDAB,  in the last 72 hours Anemia Panel: No results found for this basename: VITAMINB12, FOLATE, FERRITIN, TIBC, IRON, RETICCTPCT,  in the last 72 hours Coagulation: No results found for this basename: LABPROT, INR,  in the last 72 hours Urine Drug Screen: Drugs of Abuse     Component Value Date/Time   LABOPIA NONE DETECTED 04/23/2010 0008   COCAINSCRNUR NONE DETECTED 04/23/2010 0008   LABBENZ NONE DETECTED 04/23/2010 0008   AMPHETMU NONE DETECTED 04/23/2010 0008   THCU NONE DETECTED 04/23/2010 0008   LABBARB  Value: NONE DETECTED        DRUG SCREEN FOR MEDICAL PURPOSES ONLY.  IF CONFIRMATION IS NEEDED FOR ANY PURPOSE, NOTIFY LAB WITHIN 5 DAYS.        LOWEST DETECTABLE LIMITS FOR URINE DRUG SCREEN Drug Class       Cutoff (ng/mL) Amphetamine      1000 Barbiturate      200 Benzodiazepine   200 Tricyclics       300 Opiates          300 Cocaine          300 THC              50 04/23/2010 0008    Alcohol Level: No results found for this basename: ETH,  in the last 72 hours Urinalysis: No results found for this basename: COLORURINE, APPERANCEUR, LABSPEC, PHURINE, GLUCOSEU, HGBUR, BILIRUBINUR, KETONESUR, PROTEINUR, UROBILINOGEN, NITRITE, LEUKOCYTESUR,  in the last 72 hours Misc. Labs:   ABGS:  Recent Labs  07/28/12 0720  PHART 7.280*  PO2ART 80.4  TCO2 28.6  HCO3 30.3*     MICROBIOLOGY: Recent Results (from the past 240 hour(s))  MRSA PCR SCREENING     Status: None   Collection Time    07/27/12  8:32 PM      Result Value Range Status   MRSA by PCR NEGATIVE  NEGATIVE Final   Comment:            The GeneXpert MRSA Assay (FDA     approved for NASAL specimens     only), is one component of a     comprehensive MRSA colonization     surveillance program. It is not     intended to diagnose MRSA     infection nor to guide or     monitor treatment for     MRSA infections.     Studies/Results: Dg Chest Port 1 View  07/27/2012   *RADIOLOGY REPORT*  Clinical Data: Drug overdose.  PORTABLE CHEST - 1 VIEW  Comparison: Chest x-ray 04/23/2010.  Findings: Lung volumes are exceedingly low, which limits  the diagnostic sensitivity and specificity of this examination.  With these limitations in mind, there are some linear bibasilar opacities which are favored to represent subsegmental atelectasis. No definite acute consolidative airspace disease.  No definite pleural effusions.  No evidence of pulmonary edema.  Heart size appears borderline to mildly enlarged.  Mediastinal contours are distorted by patient positioning.  IMPRESSION: 1.  Low lung volumes with probable bibasilar subsegmental atelectasis.   Original Report Authenticated By: Trudie Reed, M.D.    Medications:  Prior to Admission:  Prescriptions prior to admission  Medication Sig Dispense Refill  . cloNIDine (CATAPRES) 0.1 MG tablet Take 0.1 mg by mouth every 6 (six) hours as needed. SBP>160      . divalproex (DEPAKOTE) 500 MG DR tablet Take 500 mg by mouth 2 (two) times daily.      . hydrOXYzine (ATARAX/VISTARIL) 25 MG tablet Take 12.5 mg by mouth daily as needed for itching or anxiety.      Marland Kitchen labetalol (NORMODYNE) 200 MG tablet Take 200 mg by mouth 2 (two) times daily.      Marland Kitchen levETIRAcetam (KEPPRA) 1000 MG tablet Take 2,000 mg by mouth 2 (two) times daily.      Marland Kitchen levothyroxine (SYNTHROID, LEVOTHROID) 137 MCG tablet Take 68.5 mcg by mouth daily.      Marland Kitchen losartan (COZAAR) 100 MG tablet Take 100 mg by mouth at bedtime.      Marland Kitchen oxyCODONE (OXY IR/ROXICODONE) 5 MG immediate release tablet Take 5 mg by mouth every 8 (eight) hours as needed for pain. Moderate to Severe pain      . simvastatin (ZOCOR) 20 MG tablet Take 20 mg by mouth at bedtime.      Marland Kitchen zolpidem (AMBIEN) 5 MG tablet Take 2.5 mg by mouth at bedtime as needed for sleep.       Scheduled: . sodium chloride   Intravenous STAT  . levETIRAcetam  1,000 mg  Intravenous Q12H  . sodium chloride  3 mL Intravenous Q12H   Continuous: . naLOXone (NARCAN) adult infusion for OVERDOSE 1 mg/hr (07/28/12 0600)   WUJ:WJXBJYNWG, hydrALAZINE, ondansetron (ZOFRAN) IV, ondansetron  Assesment: She has respiratory failure presumably from her acute narcotic overdose which is accidental. She still has elevated PCO2. She is on BiPAP and I think we can continue that. Her blood gases improved from admission but she still has respiratory acidemia. She is not known to have any lung disease. She has areas of atelectasis seen on chest x-ray but no definite infiltrate Principal Problem:   Narcotic overdose Active Problems:   Acute respiratory failure   Morbid obesity   History of stroke   Chronic pain disorder    Plan: Continue with Narcan. Continue with the lowest dose available of her medications for pain and baclofen. She apparently tolerated her previous medication better and although I'm not certain why it was changed if she were able to be switched back to her previous medication I think that would be a better situation. I will continue to follow    LOS: 1 day   [name] L 07/28/2012, 7:56 AM

## 2012-07-28 NOTE — Progress Notes (Signed)
Dr. Sherrie Mustache notified of pts abnormal rhythm. mag level ordered will continue to monitor.

## 2012-07-28 NOTE — Progress Notes (Signed)
UR Chart Review Completed  

## 2012-07-28 NOTE — Progress Notes (Signed)
EEG completed.

## 2012-07-28 NOTE — Progress Notes (Addendum)
Dr. Sherrie Mustache also notified of patients ABG's and condition at this time. Agreed to keep patient off BiPap for now, advance to full liquid diet, and titrate Narcan gtt off over the next few hours.

## 2012-07-28 NOTE — Progress Notes (Signed)
CRITICAL VALUE ALERT  Critical value received: pH 7.29 PCO2 63.0 PO2 88.4 HCO3 29.7 SO2 96.6  Date of notification:  07/28/2012  Time of notification:  1449  Critical value read back:yes  Nurse who received alert:  N.Jaylyn Booher,RN  MD notified (1st page):  Juanetta Gosling  Time of first page:  1449  MD notified (2nd page):  Time of second page:  Responding MD:  Juanetta Gosling  Time MD responded:  1510  Explained patients condition at this time was stable and pt had become more alert since taking off the BiPap, eating full liquids, and getting a bath. MD stated to leave pt off BiPap for now.

## 2012-07-28 NOTE — Care Management Note (Unsigned)
    Page 1 of 1   07/28/2012     11:49:42 AM   CARE MANAGEMENT NOTE 07/28/2012  Patient:  [name], [name]   Account Number:  [id number]  Date Initiated:  07/28/2012  Documentation initiated by:  Anibal Henderson  Subjective/Objective Assessment:   Admitted with OD of pain medication, probably accidental. On narcan drip. Pt is from [facility]     Action/Plan:   Referred to CSW   Anticipated DC Date:  07/30/2012   Anticipated DC Plan:  SKILLED NURSING FACILITY  In-house referral  Clinical Social Worker      DC Planning Services  CM consult      Choice offered to / List presented to:             Status of service:  In process, will continue to follow Medicare Important Message given?   (If response is "NO", the following Medicare IM given date fields will be blank) Date Medicare IM given:   Date Additional Medicare IM given:    Discharge Disposition:    Per UR Regulation:  Reviewed for med. necessity/level of care/duration of stay  If discussed at Long Length of Stay Meetings, dates discussed:    Comments:  07/28/12 1100 Anibal Henderson RN/CM

## 2012-07-29 DIAGNOSIS — G40909 Epilepsy, unspecified, not intractable, without status epilepticus: Secondary | ICD-10-CM

## 2012-07-29 DIAGNOSIS — D696 Thrombocytopenia, unspecified: Secondary | ICD-10-CM

## 2012-07-29 LAB — BLOOD GAS, ARTERIAL
Bicarbonate: 31.4 mEq/L — ABNORMAL HIGH (ref 20.0–24.0)
Expiratory PAP: 5
TCO2: 29.3 mmol/L (ref 0–100)
pH, Arterial: 7.301 — ABNORMAL LOW (ref 7.350–7.450)
pO2, Arterial: 81.5 mmHg (ref 80.0–100.0)

## 2012-07-29 LAB — TSH: TSH: 2.279 u[IU]/mL (ref 0.350–4.500)

## 2012-07-29 LAB — IRON AND TIBC
Iron: 56 ug/dL (ref 42–135)
UIBC: 242 ug/dL (ref 125–400)

## 2012-07-29 LAB — BASIC METABOLIC PANEL
CO2: 32 mEq/L (ref 19–32)
Glucose, Bld: 82 mg/dL (ref 70–99)
Potassium: 4.1 mEq/L (ref 3.5–5.1)
Sodium: 143 mEq/L (ref 135–145)

## 2012-07-29 LAB — RETICULOCYTES: Retic Count, Absolute: 63.5 10*3/uL (ref 19.0–186.0)

## 2012-07-29 LAB — FOLATE: Folate: 14.4 ng/mL

## 2012-07-29 LAB — CBC
Hemoglobin: 10.6 g/dL — ABNORMAL LOW (ref 12.0–15.0)
RBC: 3.53 MIL/uL — ABNORMAL LOW (ref 3.87–5.11)

## 2012-07-29 MED ORDER — LEVOTHYROXINE SODIUM 75 MCG PO TABS
68.5000 ug | ORAL_TABLET | Freq: Every day | ORAL | Status: DC
  Administered 2012-07-30 – 2012-07-31 (×2): 75 ug via ORAL
  Filled 2012-07-29 (×2): qty 1

## 2012-07-29 MED ORDER — LEVETIRACETAM 500 MG PO TABS
2000.0000 mg | ORAL_TABLET | Freq: Two times a day (BID) | ORAL | Status: DC
  Administered 2012-07-29 – 2012-07-31 (×5): 2000 mg via ORAL
  Filled 2012-07-29 (×5): qty 4

## 2012-07-29 MED ORDER — DIVALPROEX SODIUM 250 MG PO DR TAB
500.0000 mg | DELAYED_RELEASE_TABLET | Freq: Two times a day (BID) | ORAL | Status: DC
  Administered 2012-07-29 – 2012-07-31 (×5): 500 mg via ORAL
  Filled 2012-07-29 (×2): qty 2
  Filled 2012-07-29 (×2): qty 1
  Filled 2012-07-29 (×3): qty 2

## 2012-07-29 MED ORDER — OXYCODONE HCL 5 MG PO TABS: 5.0000 mg | ORAL_TABLET | ORAL | Status: DC | PRN

## 2012-07-29 MED ORDER — SIMVASTATIN 20 MG PO TABS
20.0000 mg | ORAL_TABLET | Freq: Every day | ORAL | Status: DC
  Administered 2012-07-29 – 2012-07-30 (×2): 20 mg via ORAL
  Filled 2012-07-29 (×3): qty 1

## 2012-07-29 NOTE — Progress Notes (Signed)
Subjective: The patient is sitting up in bed. She is awake and alert. She denies pain or difficulty breathing.  Objective: Vital signs in last 24 hours: Filed Vitals:   07/29/12 0500 07/29/12 0600 07/29/12 0700 07/29/12 0800  BP: 115/71 111/67 118/66 120/72  Pulse: 92 95 89 90  Temp:      TempSrc:      Resp: 13 11 7 17   Height:      Weight: 109.8 kg (242 lb 1 oz)     SpO2: 92% 94% 95% 96%    Intake/Output Summary (Last 24 hours) at 07/29/12 0857 Last data filed at 07/29/12 0800  Gross per 24 hour  Intake 2330.75 ml  Output   1100 ml  Net 1230.75 ml    Weight change: -26.279 kg (-57 lb 15 oz)  Physical exam: General: 57 year old obese African American woman in no acute distress. Lungs: Clear anteriorly with decreased breath sounds in the bases. Heart: S1, S2, with no murmurs rubs or gallops. Abdomen: Obese, positive bowel sounds, soft, nontender, nondistended. Extremities: Trace of pedal edema bilaterally. Mild tenderness over the right foot. No acute hot red joints. Neurologic: She has a mild to moderate tremor of her right arm. She is unable to move her left arm and her left leg. She has a left facial droop. She is alert and oriented to herself and hospital. Her speech is faintly dysarthric.   Lab Results: Basic Metabolic Panel:  Recent Labs  40/98/11 0559 07/28/12 0830 07/29/12 0452  NA 143  --  143  K 4.2  --  4.1  CL 105  --  104  CO2 32  --  32  GLUCOSE 85  --  82  BUN 16  --  11  CREATININE 0.63  --  0.56  CALCIUM 8.9  --  8.8  MG  --  1.8  --    Liver Function Tests:  Recent Labs  07/28/12 0559  AST 20  ALT 6  ALKPHOS 41  BILITOT 0.2*  PROT 7.0  ALBUMIN 3.1*   No results found for this basename: LIPASE, AMYLASE,  in the last 72 hours No results found for this basename: AMMONIA,  in the last 72 hours CBC:  Recent Labs  07/27/12 1535 07/28/12 0559 07/29/12 0452  WBC 6.7 5.6 6.1  NEUTROABS 4.8  --   --   HGB 10.6* 11.1* 10.6*  HCT 34.3*  36.7 34.1*  MCV 98.3 97.6 96.6  PLT 142* 123* 124*   Cardiac Enzymes:  Recent Labs  07/27/12 1535  TROPONINI <0.30   BNP: No results found for this basename: PROBNP,  in the last 72 hours D-Dimer: No results found for this basename: DDIMER,  in the last 72 hours CBG: No results found for this basename: GLUCAP,  in the last 72 hours Hemoglobin A1C: No results found for this basename: HGBA1C,  in the last 72 hours Fasting Lipid Panel: No results found for this basename: CHOL, HDL, LDLCALC, TRIG, CHOLHDL, LDLDIRECT,  in the last 72 hours Thyroid Function Tests:  Recent Labs  07/27/12 2042  TSH 3.775  FREET4 1.21   Anemia Panel:  Recent Labs  07/29/12 0452  RETICCTPCT 1.8   Coagulation: No results found for this basename: LABPROT, INR,  in the last 72 hours Urine Drug Screen: Drugs of Abuse     Component Value Date/Time   LABOPIA NONE DETECTED 04/23/2010 0008   COCAINSCRNUR NONE DETECTED 04/23/2010 0008   LABBENZ NONE DETECTED 04/23/2010 0008  AMPHETMU NONE DETECTED 04/23/2010 0008   THCU NONE DETECTED 04/23/2010 0008   LABBARB  Value: NONE DETECTED        DRUG SCREEN FOR MEDICAL PURPOSES ONLY.  IF CONFIRMATION IS NEEDED FOR ANY PURPOSE, NOTIFY LAB WITHIN 5 DAYS.        LOWEST DETECTABLE LIMITS FOR URINE DRUG SCREEN Drug Class       Cutoff (ng/mL) Amphetamine      1000 Barbiturate      200 Benzodiazepine   200 Tricyclics       300 Opiates          300 Cocaine          300 THC              50 04/23/2010 0008    Alcohol Level: No results found for this basename: ETH,  in the last 72 hours Urinalysis: No results found for this basename: COLORURINE, APPERANCEUR, LABSPEC, PHURINE, GLUCOSEU, HGBUR, BILIRUBINUR, KETONESUR, PROTEINUR, UROBILINOGEN, NITRITE, LEUKOCYTESUR,  in the last 72 hours Misc. Labs:  ABG:  07/29/2012:     7.3/66/82      (on BiPAP and 28% oxygen) 07/28/2012:     7.28/67/80.4 ( on BiPAP and 4 L of oxygen)   Micro: Recent Results (from the past 240  hour(s))  MRSA PCR SCREENING     Status: None   Collection Time    07/27/12  8:32 PM      Result Value Range Status   MRSA by PCR NEGATIVE  NEGATIVE Final   Comment:            The GeneXpert MRSA Assay (FDA     approved for NASAL specimens     only), is one component of a     comprehensive MRSA colonization     surveillance program. It is not     intended to diagnose MRSA     infection nor to guide or     monitor treatment for     MRSA infections.    Studies/Results: Dg Chest Port 1 View  07/27/2012   *RADIOLOGY REPORT*  Clinical Data: Drug overdose.  PORTABLE CHEST - 1 VIEW  Comparison: Chest x-ray 04/23/2010.  Findings: Lung volumes are exceedingly low, which limits the diagnostic sensitivity and specificity of this examination.  With these limitations in mind, there are some linear bibasilar opacities which are favored to represent subsegmental atelectasis. No definite acute consolidative airspace disease.  No definite pleural effusions.  No evidence of pulmonary edema.  Heart size appears borderline to mildly enlarged.  Mediastinal contours are distorted by patient positioning.  IMPRESSION: 1.  Low lung volumes with probable bibasilar subsegmental atelectasis.   Original Report Authenticated By: Trudie Reed, M.D.    Medications:  Scheduled: . levETIRAcetam  1,000 mg Intravenous Q12H  . sodium chloride  3 mL Intravenous Q12H  . valproate sodium  500 mg Intravenous Q12H   Continuous: . 0.9 % NaCl with KCl 20 mEq / L 70 mL/hr at 07/29/12 0800   ZOX:WRUEAVWUJ, hydrALAZINE, ondansetron (ZOFRAN) IV, ondansetron  Assessment: Principal Problem:   Narcotic overdose Active Problems:   Acute respiratory failure   Central sleep apnea   Hypercapnia   Morbid obesity   History of stroke   Chronic pain disorder   Thrombocytopenia, unspecified   Hemiparesis affecting left side as late effect of cerebrovascular accident   Unspecified hypothyroidism   Seizure disorder   1.Acute  encephalopathy secondary to Unintentional narcotic/opiate overdose compounded with baclofen. She is on  a chronic baclofen/morphine pump. The Narcan drip was started at 4 mg per hour and has been titrated off. She is more alert. The [employer] technician had reduced the dose of morphine and baclofen to the lowest possible dose following admission. We'll start when necessary oral oxycodone now that she is more alert.  Acute respiratory failure with hypercapnia in the setting of central sleep apnea and opiate overdose. Her ABG shows some improvement in pH and oxygen. Her PCO2 is still elevated, but she may be approaching baseline. We'll continue BiPAP when necessary. Appreciate Dr. Juanetta Gosling assistance.  Seizure disorder. We'll change Brian Depakote to by mouth. EEG pending.  Reported short run V. tach on 07/28/2012. Magnesium level is within normal limits. We'll continue to monitor.  Chronic pain syndrome. As above in #1.  History of stroke with left-sided hemiparalysis/ paresis.  Hypertension. She is on clonidine, Cozaar and labetalol chronically. These are currently on hold as her blood pressure is on the low-normal side.  Thrombocytopenia and anemia. May be secondary to anticonvulsants and from the dilutional effects of IV fluids. Her TSH/free T4 is within normal limits. Will await the results of the vitamin B12.  Hypothyroidism. Will restart oral Synthroid.    Plan:  1. Will transfer to the step down unit. 2. Will restart oral anticonvulsants and Synthroid. 3. We'll advance her diet to dysphagia 3 diet. 4. Check the results of the anemia panel pending. 5. Check the results of EEG pending. 6. PT evaluation. 7. Continue BiPAP when necessary. 8. Add when necessary oxycodone.        LOS: 2 days   Davide Risdon 07/29/2012, 8:57 AM

## 2012-07-29 NOTE — Evaluation (Signed)
Physical Therapy Evaluation Patient Details Name: [name] MRN: [id number] DOB: 11/20/1955 Today's Date: 07/29/2012 Time: 9147-8295 PT Time Calculation (min): 35 min  PT Assessment / Plan / Recommendation Clinical Impression  Pt was seen for evaluation.  She is alert and oriented although confused at times.  She is a resident of SNF and requires total assist for all ADLs except feeding.  Although she is a L hemi, she has significant weakness in the R extremeties as well.  Passive ROM is WNL in all joints except R ankle is fused in PF.  She receives restorative cate at [facility].  Husband was concerned about having a towel roll in the L palm for skin protection.  I made one for pt and put it in place.  It is comfortable for her.  I would recommend restorative care be continued at [facility] for maintenance of maximal joint mobility.    PT Assessment  All further PT needs can be met in the next venue of care    Follow Up Recommendations  No PT follow up (restorative care at [facility])    Does the patient have the potential to tolerate intense rehabilitation      Barriers to Discharge        Equipment Recommendations       Recommendations for Other Services     Frequency      Precautions / Restrictions Precautions Precautions: Fall Restrictions Weight Bearing Restrictions: No   Pertinent Vitals/Pain       Mobility  Bed Mobility Details for Bed Mobility Assistance: pt required total assist Transfers Transfer via Lift Equipment: [facility]    Exercises General Exercises - Upper Extremity Shoulder Flexion: AAROM;PROM;Both;5 reps Shoulder ABduction: PROM;AAROM;Both;5 reps Elbow Flexion: PROM;AAROM;Both;5 reps;Supine Elbow Extension: PROM;AAROM;Both;5 reps;Supine Wrist Flexion: PROM;AAROM;Both;5 reps;Supine Wrist Extension: PROM;AAROM;Both;5 reps;Supine General Exercises - Lower Extremity Heel Slides: PROM;AAROM;Both;5 reps;Supine Hip ABduction/ADduction: PROM;AAROM;Both;5 reps;Supine    PT Diagnosis:    PT Problem List: Decreased range of motion PT Treatment Interventions:     PT Goals    Visit Information  Last PT Received On: 07/29/12    Subjective Data  Subjective: husband states that pt needs total care at [facility] including a lift used for transfer bed to chair...he is concerned about her needing a small towel roll in her L hand for skin protection Patient Stated Goal: none stated   Prior Functioning  Home Living Available Help at Discharge: Skilled Nursing Facility Type of Home: Skilled Nursing Facility Home Access: Level entry Home Layout: One level Prior Function Level of Independence: Needs assistance Needs Assistance: Bathing;Dressing;Grooming;Toileting;Meal Prep;Light Housekeeping Bath: Total Dressing: Total Grooming: Total Toileting: Total Meal Prep: Total Light Housekeeping: Total Able to Take Stairs?: No Driving: No Vocation: On disability Communication Communication: No difficulties    Cognition  Cognition Arousal/Alertness: Awake/alert Behavior During Therapy: WFL for tasks assessed/performed Overall Cognitive Status: Within Functional Limits for tasks assessed    Extremity/Trunk Assessment Right Upper Extremity Assessment RUE ROM/Strength/Tone: Deficits RUE ROM/Strength/Tone Deficits: strength 2/5 throughout RUE Sensation: WFL - Light Touch Left Upper Extremity Assessment LUE ROM/Strength/Tone: Deficits LUE ROM/Strength/Tone Deficits: spasticity limiting ROM but passive ROM is WNL...no function Right Lower Extremity Assessment RLE ROM/Strength/Tone: Deficits RLE ROM/Strength/Tone Deficits: R ankle in fused in full PF, mild tone noted...strength at hip and knee only 2/5 RLE Sensation: WFL - Light Touch Left Lower Extremity Assessment LLE ROM/Strength/Tone: Deficits LLE ROM/Strength/Tone Deficits: spastic tone throughout with limited internal hip rotation and abduction   Balance    End of Session  PT - End of Session Activity  Tolerance: Patient tolerated treatment well Patient left: in bed;with call bell/phone within reach;with family/visitor present  GP     Myrlene Broker L 07/29/2012, 12:11 PM

## 2012-07-29 NOTE — Progress Notes (Signed)
Pt's foley catheter removed at 1100. Pt tolerated well. 300cc cloudy yellow urine emptied from collection bag. Pt transffering to room 339. Report given to Alisia Ferrari RN. Vital signs stable at transfer.

## 2012-07-29 NOTE — Progress Notes (Signed)
UR Chart Review Completed  

## 2012-07-29 NOTE — Clinical Social Work Note (Signed)
[facility] admissions updated on patient, willing to take patient back when ready for discharge.  Santa Genera, LCSW Clinical Social Worker [phone])

## 2012-07-29 NOTE — Procedures (Signed)
[facility] Maurisio Ruddy A. Gerilyn Pilgrim, MD     [email or web address]        NAMEAYN, DOMANGUE               ACCOUNT NO.:  [id number]  MEDICAL RECORD NO.:  [id number]  LOCATION:  EE                           FACILITY:  [facility]  PHYSICIAN:  Kieran Arreguin A. Gerilyn Pilgrim, M.D. DATE OF BIRTH:  [birth year]  DATE OF PROCEDURE:  07/28/2012 DATE OF DISCHARGE:  07/28/2012                             EEG INTERPRETATION   INDICATION:  This is a 57 year old lady who presents with altered mental status, unresponsiveness, and history of seizures.  MEDICATION:  Albuterol, hydralazine, Keppra, Norco, Zofran, and Decadron.  ANALYSES:  A 16 channel recording using standard 10/20 measurements is conducted for 21 minute.  There is a posterior dominant rhythm that gets as high as 7.5 Hz, although most of it seems to run in the 5-6 Hz range. There is some episodic frontal intermittent rhythmic delta activity. Photic stimulation and hyperventilation were not carried out.  There is no focal or lateralized slowing.  There is no epileptiform activity observed.  IMPRESSION:  Abnormal recording due to the following: 1. Mild to moderate generalized slowing. 2. Frontal intermittent rhythmic delta activity which is classically     seen in metabolic encephalopathies.  No epileptiform activity     observed however.     Jermel Artley A. Gerilyn Pilgrim, M.D.     KAD/MEDQ  D:  07/29/2012  T:  07/29/2012  Job:  [id number]

## 2012-07-29 NOTE — Progress Notes (Signed)
Subjective: She is more alert. She is off the Narcan drip. She has been off BiPAP and tolerated that okay. Her blood gas this morning still shows that her PCO2 is elevated and she still has a somewhat low pH suggesting that this may be acute  Objective: Vital signs in last 24 hours: Temp:  [100 F (37.8 C)-100.1 F (37.8 C)] 100 F (37.8 C) (05/23 0400) Pulse Rate:  [81-101] 92 (05/23 0500) Resp:  [0-23] 13 (05/23 0500) BP: (97-138)/(56-85) 115/71 mmHg (05/23 0500) SpO2:  [92 %-99 %] 92 % (05/23 0500) Weight:  [109.8 kg (242 lb 1 oz)] 109.8 kg (242 lb 1 oz) (05/23 0500) Weight change: -26.279 kg (-57 lb 15 oz) Last BM Date: 07/27/12  Intake/Output from previous day: 05/22 0701 - 05/23 0700 In: 2073.3 [I.V.:1743.3; IV Piggyback:330] Out: 1100 [Urine:1100]  PHYSICAL EXAM General appearance: alert, cooperative and no distress Resp: clear to auscultation bilaterally Cardio: regular rate and rhythm, S1, S2 normal, no murmur, click, rub or gallop GI: soft, non-tender; bowel sounds normal; no masses,  no organomegaly Extremities: extremities normal, atraumatic, no cyanosis or edema  Lab Results:    Basic Metabolic Panel:  Recent Labs  16/10/96 0559 07/28/12 0830 07/29/12 0452  NA 143  --  143  K 4.2  --  4.1  CL 105  --  104  CO2 32  --  32  GLUCOSE 85  --  82  BUN 16  --  11  CREATININE 0.63  --  0.56  CALCIUM 8.9  --  8.8  MG  --  1.8  --    Liver Function Tests:  Recent Labs  07/28/12 0559  AST 20  ALT 6  ALKPHOS 41  BILITOT 0.2*  PROT 7.0  ALBUMIN 3.1*   No results found for this basename: LIPASE, AMYLASE,  in the last 72 hours No results found for this basename: AMMONIA,  in the last 72 hours CBC:  Recent Labs  07/27/12 1535 07/28/12 0559 07/29/12 0452  WBC 6.7 5.6 6.1  NEUTROABS 4.8  --   --   HGB 10.6* 11.1* 10.6*  HCT 34.3* 36.7 34.1*  MCV 98.3 97.6 96.6  PLT 142* 123* 124*   Cardiac Enzymes:  Recent Labs  07/27/12 1535  TROPONINI  <0.30   BNP: No results found for this basename: PROBNP,  in the last 72 hours D-Dimer: No results found for this basename: DDIMER,  in the last 72 hours CBG: No results found for this basename: GLUCAP,  in the last 72 hours Hemoglobin A1C: No results found for this basename: HGBA1C,  in the last 72 hours Fasting Lipid Panel: No results found for this basename: CHOL, HDL, LDLCALC, TRIG, CHOLHDL, LDLDIRECT,  in the last 72 hours Thyroid Function Tests:  Recent Labs  07/27/12 2042  TSH 3.775  FREET4 1.21   Anemia Panel:  Recent Labs  07/29/12 0452  RETICCTPCT 1.8   Coagulation: No results found for this basename: LABPROT, INR,  in the last 72 hours Urine Drug Screen: Drugs of Abuse     Component Value Date/Time   LABOPIA NONE DETECTED 04/23/2010 0008   COCAINSCRNUR NONE DETECTED 04/23/2010 0008   LABBENZ NONE DETECTED 04/23/2010 0008   AMPHETMU NONE DETECTED 04/23/2010 0008   THCU NONE DETECTED 04/23/2010 0008   LABBARB  Value: NONE DETECTED        DRUG SCREEN FOR MEDICAL PURPOSES ONLY.  IF CONFIRMATION IS NEEDED FOR ANY PURPOSE, NOTIFY LAB WITHIN 5 DAYS.  LOWEST DETECTABLE LIMITS FOR URINE DRUG SCREEN Drug Class       Cutoff (ng/mL) Amphetamine      1000 Barbiturate      200 Benzodiazepine   200 Tricyclics       300 Opiates          300 Cocaine          300 THC              50 04/23/2010 0008    Alcohol Level: No results found for this basename: ETH,  in the last 72 hours Urinalysis: No results found for this basename: COLORURINE, APPERANCEUR, LABSPEC, PHURINE, GLUCOSEU, HGBUR, BILIRUBINUR, KETONESUR, PROTEINUR, UROBILINOGEN, NITRITE, LEUKOCYTESUR,  in the last 72 hours Misc. Labs:  ABGS  Recent Labs  07/29/12 0600  PHART 7.301*  PO2ART 81.5  TCO2 29.3  HCO3 31.4*   CULTURES Recent Results (from the past 240 hour(s))  MRSA PCR SCREENING     Status: None   Collection Time    07/27/12  8:32 PM      Result Value Range Status   MRSA by PCR NEGATIVE  NEGATIVE  Final   Comment:            The GeneXpert MRSA Assay (FDA     approved for NASAL specimens     only), is one component of a     comprehensive MRSA colonization     surveillance program. It is not     intended to diagnose MRSA     infection nor to guide or     monitor treatment for     MRSA infections.   Studies/Results: Dg Chest Port 1 View  07/27/2012   *RADIOLOGY REPORT*  Clinical Data: Drug overdose.  PORTABLE CHEST - 1 VIEW  Comparison: Chest x-ray 04/23/2010.  Findings: Lung volumes are exceedingly low, which limits the diagnostic sensitivity and specificity of this examination.  With these limitations in mind, there are some linear bibasilar opacities which are favored to represent subsegmental atelectasis. No definite acute consolidative airspace disease.  No definite pleural effusions.  No evidence of pulmonary edema.  Heart size appears borderline to mildly enlarged.  Mediastinal contours are distorted by patient positioning.  IMPRESSION: 1.  Low lung volumes with probable bibasilar subsegmental atelectasis.   Original Report Authenticated By: Trudie Reed, M.D.    Medications:  Prior to Admission:  Prescriptions prior to admission  Medication Sig Dispense Refill  . cloNIDine (CATAPRES) 0.1 MG tablet Take 0.1 mg by mouth every 6 (six) hours as needed. SBP>160      . divalproex (DEPAKOTE) 500 MG DR tablet Take 500 mg by mouth 2 (two) times daily.      . hydrOXYzine (ATARAX/VISTARIL) 25 MG tablet Take 12.5 mg by mouth daily as needed for itching or anxiety.      Marland Kitchen labetalol (NORMODYNE) 200 MG tablet Take 200 mg by mouth 2 (two) times daily.      Marland Kitchen levETIRAcetam (KEPPRA) 1000 MG tablet Take 2,000 mg by mouth 2 (two) times daily.      Marland Kitchen levothyroxine (SYNTHROID, LEVOTHROID) 137 MCG tablet Take 68.5 mcg by mouth daily.      Marland Kitchen losartan (COZAAR) 100 MG tablet Take 100 mg by mouth at bedtime.      Marland Kitchen oxyCODONE (OXY IR/ROXICODONE) 5 MG immediate release tablet Take 5 mg by mouth every 8  (eight) hours as needed for pain. Moderate to Severe pain      . simvastatin (ZOCOR) 20 MG tablet  Take 20 mg by mouth at bedtime.      Marland Kitchen zolpidem (AMBIEN) 5 MG tablet Take 2.5 mg by mouth at bedtime as needed for sleep.       Scheduled: . levETIRAcetam  1,000 mg Intravenous Q12H  . sodium chloride  3 mL Intravenous Q12H  . valproate sodium  500 mg Intravenous Q12H   Continuous: . 0.9 % NaCl with KCl 20 mEq / L 70 mL/hr at 07/29/12 0200   ZOX:WRUEAVWUJ, hydrALAZINE, ondansetron (ZOFRAN) IV, ondansetron  Assesment: She was admitted with acute respiratory failure did seem to be related to narcotic over to switch was accidental. She is on continuous infusion of narcotics and muscle relaxants . She is improved. She has had a previous stroke which has left her with left hemiparesis. She has seizures related to a stroke as well. She is morbidly obese and I think there is some element of obesity hypoventilation as well as sleep apnea Principal Problem:   Narcotic overdose Active Problems:   Acute respiratory failure   Morbid obesity   History of stroke   Chronic pain disorder   Thrombocytopenia, unspecified   Central sleep apnea   Hemiparesis affecting left side as late effect of cerebrovascular accident   Unspecified hypothyroidism   Hypercapnia   Seizure disorder    Plan: Continue current treatments    LOS: 2 days   Hiilani Jetter L 07/29/2012, 7:54 AM

## 2012-07-30 DIAGNOSIS — G4730 Sleep apnea, unspecified: Secondary | ICD-10-CM

## 2012-07-30 MED ORDER — LOSARTAN POTASSIUM 50 MG PO TABS
50.0000 mg | ORAL_TABLET | Freq: Every day | ORAL | Status: DC
  Administered 2012-07-31: 50 mg via ORAL
  Filled 2012-07-30: qty 1

## 2012-07-30 MED ORDER — LABETALOL HCL 200 MG PO TABS
200.0000 mg | ORAL_TABLET | Freq: Two times a day (BID) | ORAL | Status: DC
  Administered 2012-07-30 – 2012-07-31 (×3): 200 mg via ORAL
  Filled 2012-07-30 (×3): qty 1

## 2012-07-30 NOTE — Progress Notes (Signed)
Subjective: The patient is lying in bed. She has no complaints. She is breathing without difficulty. Her daughter Marcelino Duster was in the room. Questions are answered.  Objective: Vital signs in last 24 hours: Filed Vitals:   07/29/12 2241 07/30/12 0008 07/30/12 0513 07/30/12 1336  BP: 106/69  154/75 139/98  Pulse: 87 84 97 99  Temp: 100.1 F (37.8 C) 99.1 F (37.3 C) 98.2 F (36.8 C) 98.9 F (37.2 C)  TempSrc: Axillary Oral Axillary   Resp: 18 11 18 20   Height:      Weight:      SpO2: 93% 94% 90% 94%    Intake/Output Summary (Last 24 hours) at 07/30/12 1513 Last data filed at 07/30/12 0500  Gross per 24 hour  Intake   1530 ml  Output      0 ml  Net   1530 ml    Weight change:   Physical exam: General: 57 year old obese African American woman in no acute distress. Lungs: Clear anteriorly with decreased breath sounds in the bases. Heart: S1, S2, with no murmurs rubs or gallops. Abdomen: Obese, positive bowel sounds, soft, nontender, nondistended. Extremities: Trace of pedal edema bilaterally. Mild tenderness over the right foot. No acute hot red joints. Neurologic: She has a mild tremor of her right arm. She is unable to move her left arm and her left leg. She has a left facial droop. She is alert and oriented to herself and hospital. Her speech is faintly dysarthric.   Lab Results: Basic Metabolic Panel:  Recent Labs  13/08/65 0559 07/28/12 0830 07/29/12 0452  NA 143  --  143  K 4.2  --  4.1  CL 105  --  104  CO2 32  --  32  GLUCOSE 85  --  82  BUN 16  --  11  CREATININE 0.63  --  0.56  CALCIUM 8.9  --  8.8  MG  --  1.8  --    Liver Function Tests:  Recent Labs  07/28/12 0559  AST 20  ALT 6  ALKPHOS 41  BILITOT 0.2*  PROT 7.0  ALBUMIN 3.1*   No results found for this basename: LIPASE, AMYLASE,  in the last 72 hours No results found for this basename: AMMONIA,  in the last 72 hours CBC:  Recent Labs  07/27/12 1535 07/28/12 0559 07/29/12 0452   WBC 6.7 5.6 6.1  NEUTROABS 4.8  --   --   HGB 10.6* 11.1* 10.6*  HCT 34.3* 36.7 34.1*  MCV 98.3 97.6 96.6  PLT 142* 123* 124*   Cardiac Enzymes:  Recent Labs  07/27/12 1535  TROPONINI <0.30   BNP: No results found for this basename: PROBNP,  in the last 72 hours D-Dimer: No results found for this basename: DDIMER,  in the last 72 hours CBG: No results found for this basename: GLUCAP,  in the last 72 hours Hemoglobin A1C: No results found for this basename: HGBA1C,  in the last 72 hours Fasting Lipid Panel: No results found for this basename: CHOL, HDL, LDLCALC, TRIG, CHOLHDL, LDLDIRECT,  in the last 72 hours Thyroid Function Tests:  Recent Labs  07/27/12 2042 07/29/12 0452  TSH 3.775 2.279  FREET4 1.21  --    Anemia Panel:  Recent Labs  07/29/12 0452  VITAMINB12 524  FOLATE 14.4  FERRITIN 45  TIBC 298  IRON 56  RETICCTPCT 1.8   Coagulation: No results found for this basename: LABPROT, INR,  in the last 72 hours Urine  Drug Screen: Drugs of Abuse     Component Value Date/Time   LABOPIA NONE DETECTED 04/23/2010 0008   COCAINSCRNUR NONE DETECTED 04/23/2010 0008   LABBENZ NONE DETECTED 04/23/2010 0008   AMPHETMU NONE DETECTED 04/23/2010 0008   THCU NONE DETECTED 04/23/2010 0008   LABBARB  Value: NONE DETECTED        DRUG SCREEN FOR MEDICAL PURPOSES ONLY.  IF CONFIRMATION IS NEEDED FOR ANY PURPOSE, NOTIFY LAB WITHIN 5 DAYS.        LOWEST DETECTABLE LIMITS FOR URINE DRUG SCREEN Drug Class       Cutoff (ng/mL) Amphetamine      1000 Barbiturate      200 Benzodiazepine   200 Tricyclics       300 Opiates          300 Cocaine          300 THC              50 04/23/2010 0008    Alcohol Level: No results found for this basename: ETH,  in the last 72 hours Urinalysis: No results found for this basename: COLORURINE, APPERANCEUR, LABSPEC, PHURINE, GLUCOSEU, HGBUR, BILIRUBINUR, KETONESUR, PROTEINUR, UROBILINOGEN, NITRITE, LEUKOCYTESUR,  in the last 72 hours Misc. Labs:  ABG:   07/29/2012:     7.3/66/82      (on BiPAP and 28% oxygen) 07/28/2012:     7.28/67/80.4 ( on BiPAP and 4 L of oxygen)   Micro: Recent Results (from the past 240 hour(s))  MRSA PCR SCREENING     Status: None   Collection Time    07/27/12  8:32 PM      Result Value Range Status   MRSA by PCR NEGATIVE  NEGATIVE Final   Comment:            The GeneXpert MRSA Assay (FDA     approved for NASAL specimens     only), is one component of a     comprehensive MRSA colonization     surveillance program. It is not     intended to diagnose MRSA     infection nor to guide or     monitor treatment for     MRSA infections.    Studies/Results: No results found.  Medications:  Scheduled: . divalproex  500 mg Oral Q12H  . levETIRAcetam  2,000 mg Oral BID  . levothyroxine  75 mcg Oral QAC breakfast  . simvastatin  20 mg Oral QHS  . sodium chloride  3 mL Intravenous Q12H   Continuous: . 0.9 % NaCl with KCl 20 mEq / L 70 mL/hr at 07/29/12 2255   ZOX:WRUEAVWUJ, hydrALAZINE, ondansetron (ZOFRAN) IV, ondansetron, oxyCODONE  Assessment: Principal Problem:   Narcotic overdose Active Problems:   Acute respiratory failure   Central sleep apnea   Hypercapnia   Morbid obesity   History of stroke   Chronic pain disorder   Thrombocytopenia, unspecified   Hemiparesis affecting left side as late effect of cerebrovascular accident   Unspecified hypothyroidism   Seizure disorder   1.Acute encephalopathy secondary to unintentional narcotic/opiate overdose compounded with baclofen. This was precipitated by a change in her opiate infusion from fentanyl to morphine. Her mental status is now back at baseline, status post Narcan drip. The patient's daughter has spoken to her neurologist, Dr. Quentin Mulling. The plan is to discontinue morphine and place her back on the fentanyl following discharge at the hospital followup. She is currently on a the baclofen/morphine pump but the settings/drip dosing has  been  decreased. She is more alert. The [employer] technician had reduced the dose of morphine and baclofen to the lowest possible dose following admission. We'll continue necessary oral oxycodone now that she is more alert.  Acute respiratory failure with hypercapnia in the setting of central sleep apnea and opiate overdose. Her recent ABG showed some improvement in pH and oxygen. Her PCO2 is still elevated, but she may be approaching baseline. We'll continue BiPAP each bedtime. Appreciate Dr. Juanetta Gosling assistance.  Seizure disorder. Continue Keppra and Depakote. EEG revealed mild to moderate generalized slowing, but no epileptiform activity.  Reported short run V. tach on 07/28/2012. No further report of V. tach. Magnesium level is within normal limits. We'll continue to monitor.  Chronic pain syndrome. As above in #1.  History of stroke with left-sided hemiparalysis/ paresis.  Hypertension. She is on clonidine, Cozaar and labetalol chronically. These are currently on hold. Will restart them as her blood pressures trending up.  Thrombocytopenia and anemia. May be secondary to anticonvulsants and from the dilutional effects of IV fluids. Her TSH/free T4 is within normal limits. Her anemia panel reveals a normal total iron, normal ferritin, normal folate, normal vitamin B12.  Hypothyroidism. Synthroid restarted.    Plan:  1. Restart labetalol and losartan. 2. Anticipate discharge to skilled nursing facility tomorrow if all remains stable.        LOS: 3 days   Krystian Younglove 07/30/2012, 3:13 PM

## 2012-07-30 NOTE — Progress Notes (Signed)
Pt placed on full face BIPAP 15/5 with 2L o2 bleed in.  Pt tolerating well.  RT will continue to monitor.

## 2012-07-30 NOTE — Progress Notes (Signed)
Spoke to [employer] this morning.  Asked to pt's VS.  Stated that they would be closing the case out for patient.

## 2012-07-30 NOTE — Progress Notes (Signed)
She looks better. She is awake and alert. She has no new complaints. She was on BiPAP last night but is back on nasal cannula now  Her temperature is 98.2, pulse 97, respirations 18, blood pressure 154/75 and oxygen saturation is 90%. She looks comfortable. She is awake and alert. She still has hemiparesis. Her chest is clearer than it was. Her heart is regular. Her abdomen is soft.  I think she is probably back to baseline. I think she probably has some element of hypoventilation at baseline with her known sleep apnea. I agree with low-dose narcotics and titrate as needed.  I will plan to sign off at this point. Thank you for allowing me to see her with you. I will be glad to be active in her care if needed

## 2012-07-30 NOTE — Progress Notes (Signed)
Pt on BIPAP 15/5 with 2lpm Bleed in.

## 2012-07-31 MED ORDER — OXYCODONE HCL 5 MG PO TABS: 5.0000 mg | ORAL_TABLET | Freq: Three times a day (TID) | ORAL | Status: DC | PRN

## 2012-07-31 MED ORDER — LOSARTAN POTASSIUM 50 MG PO TABS: 50.0000 mg | ORAL_TABLET | Freq: Every day | ORAL | Status: DC

## 2012-07-31 NOTE — Progress Notes (Signed)
Late entry for 07/29/12 2200 - Denise from [employer] called about patients vital signs and status update.

## 2012-07-31 NOTE — Progress Notes (Signed)
Pt transported by EMS to [facility].  Pt on O2 at transfer.

## 2012-07-31 NOTE — Progress Notes (Signed)
D/C Summary and FL2 faxed to [facility].  Spoke to [facility] and stated she would let me know if they did not get it.

## 2012-07-31 NOTE — Progress Notes (Signed)
Renee from [facility] called and stated that they did not receive the information faxed previously.  FL2 and D/C Summary refaxed.

## 2012-07-31 NOTE — Discharge Summary (Signed)
Physician Discharge Summary  [name] OZH:[id number] DOB: 01/04/1956 DOA: 07/27/2012  PCP: Terald Sleeper, MD  Admit date: 07/27/2012 Discharge date: 07/31/2012  Time spent: Greater than 30 minutes  Recommendations for Outpatient Follow-up:  1. The patient will need to be scheduled for followup with Dr. Quentin Mulling for adjustment in her medication infusion for pain and spasms.  Discharge Diagnoses:   1. Acute encephalopathy secondary to unintentional narcotic/opiate overdose in the setting of chronic baclofen therapy. 2. Acute hypercapnic respiratory failure secondary to opiate overdose. 3. Chronic central sleep apnea, on chronic CPAP. 4. Chronic pain syndrome and chronic spasms. 5. Left-sided hemiplegia secondary to previous right brain stroke. 6. Morbid obesity. 7. Thrombocytopenia, possibly secondary to anticonvulsants. Vitamin B12 level and TSH were within normal limits. 8. Seizure disorder. Remains stable.  Discharge Condition: Improved and stable.  Diet recommendation: Heart healthy as tolerated.  Filed Weights   07/27/12 2025 07/28/12 0500 07/29/12 0500  Weight: 105.8 kg (233 lb 4 oz) 108.2 kg (238 lb 8.6 oz) 109.8 kg (242 lb 1 oz)    History of present illness:   [name] is a 57 y.o. female with a  past medical history of stroke, chronic spasticity and chronic pain syndrome with baclofen and narcotic pump in place. She also has a history of hypertension, and seizure disorder. She lives in a skilled nursing facility and was found to be unresponsive. EMS was called. She was given doses of narcan with improvement in responsiveness and was brought into the hospital. She was noted to have apneic spells. She was given more doses of narcan and was started on an infusion. She was also placed on BiPAP. Patient was accompanied by her husband and her daughter. Patient became minimally arousable, but was unable to provide much history. Apparently, she was changed over from  fentanyl to morphine through the pump. The reason for this change was not entirely clear. She also received a dose of Roxicodone earlier today. And, then she became unresponsive. She has required pain medications ever since her stroke in 2008. She is paralyzed on the left sided due to her stroke. It was a hemorrhagic stroke. History was limited as the patient was not fully arousable and was on BiPAP. Her blood pressures were low-normal. Her lactic acid level was within normal limits at 1.7. Her ABG on BiPAP and oxygen revealed a pH of 7.3, PCO2 of 57, and PO2 of 108. Her troponin I was negative. Her valproic acid level was therapeutic at 61. She was admitted for further evaluation and management.   Hospital Course:   The patient was started on a Narcan drip in the emergency department. She was admitted to the ICU on on the Narcan drip. BiPAP and oxygen supplementation were continued. Pulmonologist, Dr. Juanetta Gosling was consulted. He provided management recommendations. Poison control was notified and made recommendations. The [employer] technician was summoned to the ED. Per instructions from the ED physician, the morphine and baclofen infusion was reduced to the lowest possible dose. Gentle IV fluids were given for hydration. Keppra was restarted, but it was given intravenously. Because of her low-normal blood pressures, and because she was encephalopathic, her antihypertensive medications were withheld.   1.Acute encephalopathy and acute respiratory failure secondary to unintentional narcotic/opiate overdose compounded with baclofen. This was thought to be precipitated by a change in her opiate infusion from fentanyl to morphine. Her mental status waxed and waned for the first 48 hours, but she was mostly lethargic. She was maintaining her airway on  BiPAP. She was maintained on the Narcan drip for 24 hours and then it was titrated off. Eventually, her mental status stabilized to baseline. BiPAP was discontinued  during the day and continued at night for central sleep apnea. Once alert, she was restarted on as necessary oral oxycodone. Neither her pain nor did her spasticity decompensated significantly during the hospitalization. The patient's daughter had spoken to her neurologist, Dr. Quentin Mulling. The plan is to continue morphine/baclofen infusion as is and place her back on the fentanyl following discharge at the hospital followup with him.   2. Acute respiratory failure with hypercapnia in the setting of central sleep apnea and opiate overdose. Her followup ABG revealed worsen hypercapnia as apparently the BiPAP was taken off too soon. Once she was restarted on BiPAP nightly, her pH and her PCO2 improved. Last ABG on 07/29/2012 revealed a pH of 7.3, PCO2 of 66, and PO2 of 82. A followup ABG was not ordered as the patient was clinically improved and maintaining her airway.   3. Seizure disorder. Keppra and Depakote were eventually restarted. EEG as ordered revealed mild to moderate generalized slowing, but no epileptiform activity.   4. Reported short run V. tach on 07/28/2012. No further report of V. tach. Magnesium level was within normal limits. She was eventually restarted on labetalol.   5. Chronic pain syndrome. As above in #1.   6. History of stroke with left-sided hemiparalysis/ paresis.   7. Hypertension. She is on clonidine when necessary, Cozaar and labetalol chronically. These were temporarily withheld. Labetalol was eventually restarted. Losartan was started at 50 mg rather than 100 mg. As her blood pressure continues to trend up losartan at 100 mg daily can be restarted per the discretion of her primary care physician.   8. Thrombocytopenia and anemia. Her platelet count ranged from 142-124. Her hemoglobin ranged from 10.5-11. Etiology could be secondary to anticonvulsants and from the dilutional effects of IV fluids. Her TSH/free T4 was within normal limits. Her anemia panel revealed a normal total  iron, normal ferritin, normal folate, normal vitamin B12.   9. Hypothyroidism. Synthroid was restarted.     Procedures:  EEG.  Consultations:  Pulmonologist, Kari Baars M.D.  Discharge Exam: Filed Vitals:   07/30/12 2028 07/30/12 2049 07/31/12 0609 07/31/12 0934  BP:  111/74 106/69 116/79  Pulse:  94 85 88  Temp:  98.5 F (36.9 C) 98.7 F (37.1 C)   TempSrc:      Resp:  16 20   Height:      Weight:      SpO2: 93% 91% 90%     General: Alert obese pleasant 57 year old African-American woman laying in bed, in no acute distress. Cardiovascular: S1, S2, with no murmurs rubs or gallops. Respiratory: Breathing nonlabored. Lungs clear anteriorly.  Discharge Instructions  Discharge Orders   Future Orders Complete By Expires     Diet - low sodium heart healthy  As directed     Discharge instructions  As directed     Comments:      The dose of losartan was decreased to 50 mg daily due to low- normal blood pressures during hospitalization. The dosing of the morphine/baclofen pump was decreased. Further management will be deferred to the patient's neurologist, Dr. Quentin Mulling.    Increase activity slowly  As directed         Medication List    STOP taking these medications       zolpidem 5 MG tablet  Commonly known as:  AMBIEN      TAKE these medications       cloNIDine 0.1 MG tablet  Commonly known as:  CATAPRES  Take 0.1 mg by mouth every 6 (six) hours as needed. SBP>160     divalproex 500 MG DR tablet  Commonly known as:  DEPAKOTE  Take 500 mg by mouth 2 (two) times daily.     hydrOXYzine 25 MG tablet  Commonly known as:  ATARAX/VISTARIL  Take 12.5 mg by mouth daily as needed for itching or anxiety.     labetalol 200 MG tablet  Commonly known as:  NORMODYNE  Take 200 mg by mouth 2 (two) times daily.     levETIRAcetam 1000 MG tablet  Commonly known as:  KEPPRA  Take 2,000 mg by mouth 2 (two) times daily.     levothyroxine 137 MCG tablet  Commonly  known as:  SYNTHROID, LEVOTHROID  Take 68.5 mcg by mouth daily.     losartan 50 MG tablet  Commonly known as:  COZAAR  Take 1 tablet (50 mg total) by mouth at bedtime.     oxyCODONE 5 MG immediate release tablet  Commonly known as:  Oxy IR/ROXICODONE  Take 1 tablet (5 mg total) by mouth every 8 (eight) hours as needed for pain. Moderate to Severe pain     simvastatin 20 MG tablet  Commonly known as:  ZOCOR  Take 20 mg by mouth at bedtime.       Allergies  Allergen Reactions  . Codeine       The results of significant diagnostics from this hospitalization (including imaging, microbiology, ancillary and laboratory) are listed below for reference.    Significant Diagnostic Studies: Dg Chest Port 1 View  07/27/2012   *RADIOLOGY REPORT*  Clinical Data: Drug overdose.  PORTABLE CHEST - 1 VIEW  Comparison: Chest x-ray 04/23/2010.  Findings: Lung volumes are exceedingly low, which limits the diagnostic sensitivity and specificity of this examination.  With these limitations in mind, there are some linear bibasilar opacities which are favored to represent subsegmental atelectasis. No definite acute consolidative airspace disease.  No definite pleural effusions.  No evidence of pulmonary edema.  Heart size appears borderline to mildly enlarged.  Mediastinal contours are distorted by patient positioning.  IMPRESSION: 1.  Low lung volumes with probable bibasilar subsegmental atelectasis.   Original Report Authenticated By: Trudie Reed, M.D.    Microbiology: Recent Results (from the past 240 hour(s))  MRSA PCR SCREENING     Status: None   Collection Time    07/27/12  8:32 PM      Result Value Range Status   MRSA by PCR NEGATIVE  NEGATIVE Final   Comment:            The GeneXpert MRSA Assay (FDA     approved for NASAL specimens     only), is one component of a     comprehensive MRSA colonization     surveillance program. It is not     intended to diagnose MRSA     infection nor to  guide or     monitor treatment for     MRSA infections.     Labs: Basic Metabolic Panel:  Recent Labs Lab 07/27/12 1535 07/28/12 0559 07/28/12 0830 07/29/12 0452  NA 145 143  --  143  K 4.6 4.2  --  4.1  CL 104 105  --  104  CO2 33* 32  --  32  GLUCOSE 123* 85  --  82  BUN 22 16  --  11  CREATININE 1.04 0.63  --  0.56  CALCIUM 9.5 8.9  --  8.8  MG  --   --  1.8  --    Liver Function Tests:  Recent Labs Lab 07/28/12 0559  AST 20  ALT 6  ALKPHOS 41  BILITOT 0.2*  PROT 7.0  ALBUMIN 3.1*   No results found for this basename: LIPASE, AMYLASE,  in the last 168 hours No results found for this basename: AMMONIA,  in the last 168 hours CBC:  Recent Labs Lab 07/27/12 1535 07/28/12 0559 07/29/12 0452  WBC 6.7 5.6 6.1  NEUTROABS 4.8  --   --   HGB 10.6* 11.1* 10.6*  HCT 34.3* 36.7 34.1*  MCV 98.3 97.6 96.6  PLT 142* 123* 124*   Cardiac Enzymes:  Recent Labs Lab 07/27/12 1535  TROPONINI <0.30   BNP: BNP (last 3 results) No results found for this basename: PROBNP,  in the last 8760 hours CBG: No results found for this basename: GLUCAP,  in the last 168 hours     Signed:  Waverley Krempasky  [facility] 07/31/2012, 10:11 AM

## 2012-07-31 NOTE — Progress Notes (Signed)
Report called to Renee at [facility].

## 2012-08-01 ENCOUNTER — Inpatient Hospital Stay (HOSPITAL_COMMUNITY): Payer: PRIVATE HEALTH INSURANCE

## 2012-08-01 ENCOUNTER — Encounter (HOSPITAL_COMMUNITY): Payer: Self-pay | Admitting: Emergency Medicine

## 2012-08-01 ENCOUNTER — Inpatient Hospital Stay (HOSPITAL_COMMUNITY)
Admission: EM | Admit: 2012-08-01 | Discharge: 2012-08-09 | DRG: 917 | Disposition: A | Discharge status: Skilled Nursing Facility | Payer: PRIVATE HEALTH INSURANCE | Attending: Internal Medicine | Admitting: Internal Medicine

## 2012-08-01 ENCOUNTER — Emergency Department (HOSPITAL_COMMUNITY): Payer: PRIVATE HEALTH INSURANCE

## 2012-08-01 DIAGNOSIS — J9600 Acute respiratory failure, unspecified whether with hypoxia or hypercapnia: Secondary | ICD-10-CM

## 2012-08-01 DIAGNOSIS — F329 Major depressive disorder, single episode, unspecified: Secondary | ICD-10-CM | POA: Diagnosis present

## 2012-08-01 DIAGNOSIS — D72829 Elevated white blood cell count, unspecified: Secondary | ICD-10-CM | POA: Diagnosis not present

## 2012-08-01 DIAGNOSIS — Z8673 Personal history of transient ischemic attack (TIA), and cerebral infarction without residual deficits: Secondary | ICD-10-CM

## 2012-08-01 DIAGNOSIS — Z8614 Personal history of Methicillin resistant Staphylococcus aureus infection: Secondary | ICD-10-CM

## 2012-08-01 DIAGNOSIS — F3289 Other specified depressive episodes: Secondary | ICD-10-CM | POA: Diagnosis present

## 2012-08-01 DIAGNOSIS — G929 Unspecified toxic encephalopathy: Secondary | ICD-10-CM | POA: Diagnosis present

## 2012-08-01 DIAGNOSIS — N390 Urinary tract infection, site not specified: Secondary | ICD-10-CM | POA: Diagnosis present

## 2012-08-01 DIAGNOSIS — T40601A Poisoning by unspecified narcotics, accidental (unintentional), initial encounter: Principal | ICD-10-CM | POA: Diagnosis present

## 2012-08-01 DIAGNOSIS — Z23 Encounter for immunization: Secondary | ICD-10-CM

## 2012-08-01 DIAGNOSIS — E876 Hypokalemia: Secondary | ICD-10-CM | POA: Diagnosis not present

## 2012-08-01 DIAGNOSIS — G894 Chronic pain syndrome: Secondary | ICD-10-CM | POA: Diagnosis present

## 2012-08-01 DIAGNOSIS — I69354 Hemiplegia and hemiparesis following cerebral infarction affecting left non-dominant side: Secondary | ICD-10-CM

## 2012-08-01 DIAGNOSIS — G9340 Encephalopathy, unspecified: Secondary | ICD-10-CM

## 2012-08-01 DIAGNOSIS — R197 Diarrhea, unspecified: Secondary | ICD-10-CM | POA: Diagnosis not present

## 2012-08-01 DIAGNOSIS — E119 Type 2 diabetes mellitus without complications: Secondary | ICD-10-CM | POA: Diagnosis present

## 2012-08-01 DIAGNOSIS — T400X1A Poisoning by opium, accidental (unintentional), initial encounter: Secondary | ICD-10-CM | POA: Diagnosis present

## 2012-08-01 DIAGNOSIS — E039 Hypothyroidism, unspecified: Secondary | ICD-10-CM | POA: Diagnosis present

## 2012-08-01 DIAGNOSIS — I69959 Hemiplegia and hemiparesis following unspecified cerebrovascular disease affecting unspecified side: Secondary | ICD-10-CM

## 2012-08-01 DIAGNOSIS — A498 Other bacterial infections of unspecified site: Secondary | ICD-10-CM | POA: Diagnosis present

## 2012-08-01 DIAGNOSIS — G4731 Primary central sleep apnea: Secondary | ICD-10-CM

## 2012-08-01 DIAGNOSIS — J690 Pneumonitis due to inhalation of food and vomit: Secondary | ICD-10-CM | POA: Diagnosis present

## 2012-08-01 DIAGNOSIS — D649 Anemia, unspecified: Secondary | ICD-10-CM | POA: Diagnosis present

## 2012-08-01 DIAGNOSIS — Z79899 Other long term (current) drug therapy: Secondary | ICD-10-CM

## 2012-08-01 DIAGNOSIS — R0689 Other abnormalities of breathing: Secondary | ICD-10-CM

## 2012-08-01 DIAGNOSIS — G92 Toxic encephalopathy: Secondary | ICD-10-CM | POA: Diagnosis present

## 2012-08-01 DIAGNOSIS — G4737 Central sleep apnea in conditions classified elsewhere: Secondary | ICD-10-CM | POA: Diagnosis present

## 2012-08-01 DIAGNOSIS — D696 Thrombocytopenia, unspecified: Secondary | ICD-10-CM

## 2012-08-01 DIAGNOSIS — J9690 Respiratory failure, unspecified, unspecified whether with hypoxia or hypercapnia: Secondary | ICD-10-CM

## 2012-08-01 DIAGNOSIS — G40909 Epilepsy, unspecified, not intractable, without status epilepticus: Secondary | ICD-10-CM

## 2012-08-01 DIAGNOSIS — Z7982 Long term (current) use of aspirin: Secondary | ICD-10-CM

## 2012-08-01 DIAGNOSIS — Z6841 Body Mass Index (BMI) 40.0 and over, adult: Secondary | ICD-10-CM

## 2012-08-01 LAB — URINALYSIS, ROUTINE W REFLEX MICROSCOPIC
Bilirubin Urine: NEGATIVE
Nitrite: NEGATIVE
Specific Gravity, Urine: 1.018 (ref 1.005–1.030)
Urobilinogen, UA: 1 mg/dL (ref 0.0–1.0)
pH: 7 (ref 5.0–8.0)

## 2012-08-01 LAB — CBC
Hemoglobin: 10.2 g/dL — ABNORMAL LOW (ref 12.0–15.0)
MCH: 30.5 pg (ref 26.0–34.0)
MCV: 97.6 fL (ref 78.0–100.0)
RBC: 3.34 MIL/uL — ABNORMAL LOW (ref 3.87–5.11)

## 2012-08-01 LAB — POCT I-STAT, CHEM 8
Calcium, Ion: 1.19 mmol/L (ref 1.12–1.23)
Chloride: 102 mEq/L (ref 96–112)
HCT: 32 % — ABNORMAL LOW (ref 36.0–46.0)
Potassium: 4.1 mEq/L (ref 3.5–5.1)

## 2012-08-01 LAB — BLOOD GAS, ARTERIAL
Acid-Base Excess: 10 mmol/L — ABNORMAL HIGH (ref 0.0–2.0)
FIO2: 1 %
MECHVT: 500 mL
O2 Saturation: 100 %
Patient temperature: 100.7
RATE: 14 resp/min

## 2012-08-01 LAB — GLUCOSE, CAPILLARY: Glucose-Capillary: 78 mg/dL (ref 70–99)

## 2012-08-01 LAB — COMPREHENSIVE METABOLIC PANEL
ALT: 5 U/L (ref 0–35)
CO2: 35 mEq/L — ABNORMAL HIGH (ref 19–32)
Calcium: 8.8 mg/dL (ref 8.4–10.5)
Creatinine, Ser: 0.52 mg/dL (ref 0.50–1.10)
GFR calc Af Amer: >90 mL/min (ref >90)
GFR calc non Af Amer: >90 mL/min (ref >90)
Glucose, Bld: 98 mg/dL (ref 70–99)

## 2012-08-01 LAB — URINE MICROSCOPIC-ADD ON

## 2012-08-01 MED ORDER — DIVALPROEX SODIUM 125 MG PO CPSP
500.0000 mg | ORAL_CAPSULE | Freq: Two times a day (BID) | ORAL | Status: DC
  Filled 2012-08-01 (×2): qty 4

## 2012-08-01 MED ORDER — SIMVASTATIN 20 MG PO TABS
20.0000 mg | ORAL_TABLET | Freq: Every day | ORAL | Status: DC
  Filled 2012-08-01: qty 1

## 2012-08-01 MED ORDER — LABETALOL HCL 200 MG PO TABS
200.0000 mg | ORAL_TABLET | Freq: Two times a day (BID) | ORAL | Status: DC
  Filled 2012-08-01 (×2): qty 1

## 2012-08-01 MED ORDER — LOSARTAN POTASSIUM 50 MG PO TABS
50.0000 mg | ORAL_TABLET | Freq: Every day | ORAL | Status: DC
  Filled 2012-08-01: qty 1

## 2012-08-01 MED ORDER — VALPROATE SODIUM 500 MG/5ML IV SOLN
500.0000 mg | Freq: Two times a day (BID) | INTRAVENOUS | Status: DC
  Administered 2012-08-02 – 2012-08-06 (×9): 500 mg via INTRAVENOUS
  Filled 2012-08-01 (×11): qty 5

## 2012-08-01 MED ORDER — PRESCRIPTION MEDICATION: 1.0000 | Status: DC

## 2012-08-01 MED ORDER — ETOMIDATE 2 MG/ML IV SOLN
INTRAVENOUS | Status: AC
  Filled 2012-08-01: qty 10

## 2012-08-01 MED ORDER — ETOMIDATE 2 MG/ML IV SOLN
30.0000 mg | Freq: Once | INTRAVENOUS | Status: AC
  Administered 2012-08-01: 30 mg via INTRAVENOUS

## 2012-08-01 MED ORDER — SODIUM CHLORIDE 0.9 % IJ SOLN: 3.0000 mL | INTRAMUSCULAR | Status: DC | PRN

## 2012-08-01 MED ORDER — PANTOPRAZOLE SODIUM 40 MG IV SOLR
40.0000 mg | INTRAVENOUS | Status: DC
  Administered 2012-08-01: 40 mg via INTRAVENOUS
  Filled 2012-08-01: qty 40

## 2012-08-01 MED ORDER — IPRATROPIUM BROMIDE 0.02 % IN SOLN
0.5000 mg | Freq: Four times a day (QID) | RESPIRATORY_TRACT | Status: DC | PRN
  Administered 2012-08-04 (×2): 0.5 mg via RESPIRATORY_TRACT
  Filled 2012-08-01 (×2): qty 2.5

## 2012-08-01 MED ORDER — LORAZEPAM 2 MG/ML IJ SOLN
INTRAMUSCULAR | Status: AC
  Administered 2012-08-01: 1 mg via INTRAVENOUS
  Filled 2012-08-01: qty 1

## 2012-08-01 MED ORDER — SODIUM CHLORIDE 0.9 % IJ SOLN
3.0000 mL | Freq: Two times a day (BID) | INTRAMUSCULAR | Status: DC
  Administered 2012-08-01 – 2012-08-08 (×11): 3 mL via INTRAVENOUS

## 2012-08-01 MED ORDER — AMLODIPINE BESYLATE 5 MG PO TABS
5.0000 mg | ORAL_TABLET | Freq: Every day | ORAL | Status: DC
  Administered 2012-08-01 – 2012-08-09 (×9): 5 mg
  Filled 2012-08-01 (×9): qty 1

## 2012-08-01 MED ORDER — CHLORHEXIDINE GLUCONATE 0.12 % MT SOLN
15.0000 mL | Freq: Two times a day (BID) | OROMUCOSAL | Status: DC
  Administered 2012-08-01 – 2012-08-05 (×9): 15 mL via OROMUCOSAL
  Filled 2012-08-01 (×9): qty 15

## 2012-08-01 MED ORDER — PIPERACILLIN-TAZOBACTAM 3.375 G IVPB
3.3750 g | Freq: Three times a day (TID) | INTRAVENOUS | Status: DC
  Administered 2012-08-01 – 2012-08-03 (×7): 3.375 g via INTRAVENOUS
  Filled 2012-08-01 (×9): qty 50

## 2012-08-01 MED ORDER — FENTANYL BOLUS VIA INFUSION
25.0000 ug | Freq: Four times a day (QID) | INTRAVENOUS | Status: DC | PRN
  Filled 2012-08-01: qty 100

## 2012-08-01 MED ORDER — POLYETHYLENE GLYCOL 3350 17 G PO PACK
17.0000 g | PACK | Freq: Every day | ORAL | Status: DC
  Administered 2012-08-01 – 2012-08-08 (×3): 17 g via ORAL
  Filled 2012-08-01 (×9): qty 1

## 2012-08-01 MED ORDER — VITAMIN D3 25 MCG (1000 UNIT) PO TABS
3000.0000 [IU] | ORAL_TABLET | Freq: Every day | ORAL | Status: DC
  Administered 2012-08-01: 3000 [IU] via ORAL
  Filled 2012-08-01: qty 3

## 2012-08-01 MED ORDER — BENZOCAINE 10 % MT GEL: 1.0000 "application " | OROMUCOSAL | Status: DC | PRN

## 2012-08-01 MED ORDER — LEVOTHYROXINE SODIUM 137 MCG PO TABS: 68.5000 ug | ORAL_TABLET | ORAL | Status: DC

## 2012-08-01 MED ORDER — INSULIN ASPART 100 UNIT/ML ~~LOC~~ SOLN
0.0000 [IU] | SUBCUTANEOUS | Status: DC
  Administered 2012-08-03 (×2): 3 [IU] via SUBCUTANEOUS

## 2012-08-01 MED ORDER — DIVALPROEX SODIUM 500 MG PO DR TAB
500.0000 mg | DELAYED_RELEASE_TABLET | Freq: Two times a day (BID) | ORAL | Status: DC
  Filled 2012-08-01 (×2): qty 1

## 2012-08-01 MED ORDER — ACETAMINOPHEN 160 MG/5ML PO SOLN
650.0000 mg | ORAL | Status: DC | PRN
  Administered 2012-08-01: 650 mg
  Filled 2012-08-01: qty 20.3

## 2012-08-01 MED ORDER — PANTOPRAZOLE SODIUM 40 MG PO PACK
40.0000 mg | PACK | ORAL | Status: DC
  Administered 2012-08-02 – 2012-08-06 (×4): 40 mg
  Filled 2012-08-01 (×9): qty 20

## 2012-08-01 MED ORDER — LORAZEPAM 2 MG/ML IJ SOLN
2.0000 mg | Freq: Once | INTRAMUSCULAR | Status: AC
  Administered 2012-08-01: 2 mg via INTRAVENOUS
  Filled 2012-08-01: qty 1

## 2012-08-01 MED ORDER — NALOXONE HCL 1 MG/ML IJ SOLN
1.0000 mg/h | INTRAVENOUS | Status: DC
  Administered 2012-08-01: 1 mg/h via INTRAVENOUS
  Filled 2012-08-01: qty 4

## 2012-08-01 MED ORDER — LEVETIRACETAM 750 MG PO TABS
2000.0000 mg | ORAL_TABLET | Freq: Two times a day (BID) | ORAL | Status: DC
  Filled 2012-08-01 (×2): qty 1

## 2012-08-01 MED ORDER — ROCURONIUM BROMIDE 50 MG/5ML IV SOLN
50.0000 mg | Freq: Once | INTRAVENOUS | Status: AC
  Administered 2012-08-01: 50 mg via INTRAVENOUS

## 2012-08-01 MED ORDER — SODIUM CHLORIDE 0.9 % IV SOLN
25.0000 ug/h | INTRAVENOUS | Status: DC
  Administered 2012-08-01: 50 ug/h via INTRAVENOUS
  Filled 2012-08-01: qty 50

## 2012-08-01 MED ORDER — CLONIDINE HCL 0.1 MG PO TABS
0.1000 mg | ORAL_TABLET | Freq: Three times a day (TID) | ORAL | Status: DC
  Filled 2012-08-01 (×3): qty 1

## 2012-08-01 MED ORDER — LABETALOL HCL 200 MG PO TABS
200.0000 mg | ORAL_TABLET | Freq: Two times a day (BID) | ORAL | Status: DC
  Administered 2012-08-01 – 2012-08-04 (×7): 200 mg
  Filled 2012-08-01 (×8): qty 1

## 2012-08-01 MED ORDER — VANCOMYCIN HCL IN DEXTROSE 1-5 GM/200ML-% IV SOLN
1000.0000 mg | Freq: Two times a day (BID) | INTRAVENOUS | Status: DC
  Administered 2012-08-01 – 2012-08-03 (×5): 1000 mg via INTRAVENOUS
  Filled 2012-08-01 (×7): qty 200

## 2012-08-01 MED ORDER — LEVOTHYROXINE SODIUM 100 MCG IV SOLR
66.0000 ug | Freq: Every day | INTRAVENOUS | Status: DC
  Filled 2012-08-01 (×2): qty 5

## 2012-08-01 MED ORDER — CLONIDINE HCL 0.1 MG/24HR TD PTWK
0.1000 mg | MEDICATED_PATCH | TRANSDERMAL | Status: DC
  Administered 2012-08-01 – 2012-08-08 (×2): 0.1 mg via TRANSDERMAL
  Filled 2012-08-01 (×2): qty 1

## 2012-08-01 MED ORDER — PANTOPRAZOLE SODIUM 40 MG PO TBEC: 40.0000 mg | DELAYED_RELEASE_TABLET | Freq: Every day | ORAL | Status: DC

## 2012-08-01 MED ORDER — SODIUM CHLORIDE 0.9 % IV SOLN
1000.0000 mg | Freq: Two times a day (BID) | INTRAVENOUS | Status: DC
  Administered 2012-08-01 – 2012-08-03 (×5): 1000 mg via INTRAVENOUS
  Filled 2012-08-01 (×7): qty 10

## 2012-08-01 MED ORDER — ASPIRIN EC 81 MG PO TBEC
81.0000 mg | DELAYED_RELEASE_TABLET | Freq: Every day | ORAL | Status: DC
  Filled 2012-08-01: qty 1

## 2012-08-01 MED ORDER — VALPROATE SODIUM 500 MG/5ML IV SOLN
1.0000 g | Freq: Once | INTRAVENOUS | Status: AC
  Administered 2012-08-01: 1000 mg via INTRAVENOUS
  Filled 2012-08-01: qty 10

## 2012-08-01 MED ORDER — LOSARTAN POTASSIUM 50 MG PO TABS
50.0000 mg | ORAL_TABLET | Freq: Every day | ORAL | Status: DC
  Administered 2012-08-01 – 2012-08-07 (×7): 50 mg
  Filled 2012-08-01 (×8): qty 1

## 2012-08-01 MED ORDER — SIMVASTATIN 20 MG PO TABS
20.0000 mg | ORAL_TABLET | Freq: Every day | ORAL | Status: DC
  Administered 2012-08-01 – 2012-08-08 (×7): 20 mg
  Filled 2012-08-01 (×9): qty 1

## 2012-08-01 MED ORDER — FENTANYL CITRATE 0.05 MG/ML IJ SOLN
100.0000 ug | Freq: Once | INTRAMUSCULAR | Status: AC
  Administered 2012-08-01: 100 ug via INTRAVENOUS

## 2012-08-01 MED ORDER — BIOTENE DRY MOUTH MT LIQD
15.0000 mL | Freq: Four times a day (QID) | OROMUCOSAL | Status: DC
  Administered 2012-08-01 – 2012-08-08 (×25): 15 mL via OROMUCOSAL

## 2012-08-01 MED ORDER — IPRATROPIUM-ALBUTEROL 0.5-2.5 (3) MG/3ML IN SOLN: 3.0000 mL | Freq: Four times a day (QID) | RESPIRATORY_TRACT | Status: DC | PRN

## 2012-08-01 MED ORDER — DULOXETINE HCL 60 MG PO CPEP
60.0000 mg | ORAL_CAPSULE | Freq: Every day | ORAL | Status: DC
  Filled 2012-08-01: qty 1

## 2012-08-01 MED ORDER — HYDROXYZINE HCL 25 MG PO TABS: 12.5000 mg | ORAL_TABLET | Freq: Every day | ORAL | Status: DC | PRN

## 2012-08-01 MED ORDER — NALOXONE HCL 1 MG/ML IJ SOLN
1.0000 mg/h | INTRAVENOUS | Status: DC
  Filled 2012-08-01: qty 4

## 2012-08-01 MED ORDER — MIDAZOLAM HCL 2 MG/2ML IJ SOLN
2.0000 mg | Freq: Once | INTRAMUSCULAR | Status: DC
  Filled 2012-08-01: qty 4

## 2012-08-01 MED ORDER — MIDAZOLAM HCL 2 MG/2ML IJ SOLN
2.0000 mg | Freq: Once | INTRAMUSCULAR | Status: AC
  Administered 2012-08-01: 2 mg via INTRAVENOUS

## 2012-08-01 MED ORDER — AMLODIPINE 1 MG/ML ORAL SUSPENSION
5.0000 mg | Freq: Every day | ORAL | Status: DC
  Filled 2012-08-01: qty 5

## 2012-08-01 MED ORDER — LEVOTHYROXINE SODIUM 137 MCG PO TABS
68.5000 ug | ORAL_TABLET | Freq: Every day | ORAL | Status: DC
  Administered 2012-08-02 – 2012-08-08 (×7): 68.5 ug
  Filled 2012-08-01 (×11): qty 0.5

## 2012-08-01 MED ORDER — NALOXONE HCL 0.4 MG/ML IJ SOLN
0.4000 mg | Freq: Once | INTRAMUSCULAR | Status: AC
  Administered 2012-08-01: 0.4 mg via INTRAVENOUS
  Filled 2012-08-01: qty 1

## 2012-08-01 MED ORDER — ALBUTEROL SULFATE (5 MG/ML) 0.5% IN NEBU
2.5000 mg | INHALATION_SOLUTION | Freq: Four times a day (QID) | RESPIRATORY_TRACT | Status: DC | PRN
  Administered 2012-08-04 (×2): 2.5 mg via RESPIRATORY_TRACT
  Filled 2012-08-01 (×2): qty 0.5

## 2012-08-01 MED ORDER — VECURONIUM BROMIDE 10 MG IV SOLR
INTRAVENOUS | Status: AC
  Filled 2012-08-01: qty 10

## 2012-08-01 MED ORDER — OLOPATADINE HCL 0.1 % OP SOLN
1.0000 [drp] | Freq: Two times a day (BID) | OPHTHALMIC | Status: DC
  Administered 2012-08-01 – 2012-08-09 (×17): 1 [drp] via OPHTHALMIC
  Filled 2012-08-01 (×2): qty 5

## 2012-08-01 MED ORDER — SODIUM CHLORIDE 0.9 % IJ SOLN: 3.0000 mL | Freq: Two times a day (BID) | INTRAMUSCULAR | Status: DC

## 2012-08-01 MED ORDER — SODIUM CHLORIDE 0.9 % IV SOLN
INTRAVENOUS | Status: DC
  Administered 2012-08-01 – 2012-08-07 (×7): via INTRAVENOUS

## 2012-08-01 MED ORDER — VANCOMYCIN HCL 10 G IV SOLR
2500.0000 mg | Freq: Once | INTRAVENOUS | Status: AC
  Administered 2012-08-01: 2500 mg via INTRAVENOUS
  Filled 2012-08-01: qty 2500

## 2012-08-01 MED ORDER — FENTANYL CITRATE 0.05 MG/ML IJ SOLN
INTRAMUSCULAR | Status: AC
  Filled 2012-08-01: qty 2

## 2012-08-01 MED ORDER — MIDAZOLAM HCL 2 MG/2ML IJ SOLN
INTRAMUSCULAR | Status: AC
  Filled 2012-08-01: qty 4

## 2012-08-01 MED ORDER — FENTANYL CITRATE 0.05 MG/ML IJ SOLN
50.0000 ug | INTRAMUSCULAR | Status: DC | PRN
  Administered 2012-08-01 (×2): 100 ug via INTRAVENOUS
  Filled 2012-08-01 (×2): qty 2

## 2012-08-01 MED ORDER — SODIUM CHLORIDE 0.9 % IV SOLN: 250.0000 mL | INTRAVENOUS | Status: DC | PRN

## 2012-08-01 MED ORDER — AMLODIPINE BESYLATE 5 MG PO TABS
5.0000 mg | ORAL_TABLET | Freq: Every day | ORAL | Status: DC
  Filled 2012-08-01: qty 1

## 2012-08-01 MED ORDER — PROPOFOL 10 MG/ML IV EMUL
5.0000 ug/kg/min | INTRAVENOUS | Status: DC
  Administered 2012-08-01: 5 ug/kg/min via INTRAVENOUS
  Administered 2012-08-02: 10 ug/kg/min via INTRAVENOUS
  Filled 2012-08-01 (×2): qty 100

## 2012-08-01 NOTE — Progress Notes (Addendum)
MEDICATION RELATED CONSULT NOTE - INITIAL ; Antibiotics  Pharmacy Consult for technical assistance with implanted infusion device and Vancomycin + zosyn Indication: AMS and siezures; Empiric pneumonia  Allergies  Allergen Reactions  . Codeine Other (See Comments)    unknown    Patient Measurements: Height: 5\' 2"  (157.5 cm) Weight: 242 lb 4.6 oz (109.9 kg) IBW/kg (Calculated) : 50.1  57 yo F admitted 08/01/2012  AMS and seizures.  Pharmacy called by CCM to 3100 to assist in turning off the morphine/baclofen pain pump & start Vancomycin and Zosyn  Assessment: Neuro: h/o siezures, current siezures and pain pump. Requiring narcan for AMS.    ID: febrile, WBC wnl, but several recent hospitalizations, empiric pneumonia/sepsis. 5/26 Vancomycin 5/26 Zosyn 5/26 Blood, urine, TA  Plan: 1. Spoke to [employer] rep via Engineer, materials number [phone], on call rep to come and turn off device. 2. Vancomycin 2.5 g IV x1 then 1g IV q12h 3. Zosyn 3.375g IV q8h infuse over 4h   Vital Signs: Temp: 100.7 F (38.2 C) (05/26 0615) Temp src: Axillary (05/26 0615) BP: 147/73 mmHg (05/26 0715) Pulse Rate: 102 (05/26 0715) Intake/Output from previous day: 05/25 0701 - 05/26 0700 In: 235 [I.V.:125; IV Piggyback:110] Out: -  Intake/Output from this shift:    Labs:  Recent Labs  08/01/12 0045 08/01/12 0049  WBC 5.3  --   HGB 10.2* 10.9*  HCT 32.6* 32.0*  PLT 86*  --   CREATININE 0.52 0.70  ALBUMIN 2.5*  --   PROT 5.9*  --   AST 13  --   ALT 5  --   ALKPHOS 33*  --   BILITOT 0.2*  --    Estimated Creatinine Clearance: 91.7 ml/min (by C-G formula based on Cr of 0.7).   Microbiology: Recent Results (from the past 720 hour(s))  MRSA PCR SCREENING     Status: None   Collection Time    07/27/12  8:32 PM      Result Value Range Status   MRSA by PCR NEGATIVE  NEGATIVE Final   Comment:            The GeneXpert MRSA Assay (FDA     approved for NASAL specimens   only), is one component of a     comprehensive MRSA colonization     surveillance program. It is not     intended to diagnose MRSA     infection nor to guide or     monitor treatment for     MRSA infections.    Medical History: Past Medical History  Diagnosis Date  . Hypertension   . Seizure disorder   . Metabolic encephalopathy 05/06/2010  . Respiratory failure 05/06/10  . UTI (lower urinary tract infection) 05/06/10  . 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 Sleep apnea 04/2010    on CPAP, "severe central sleep apnea"  . 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 Chronic pain   . Depression   . 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 Spasticity     chronic  . Diabetes mellitus without complication   . Thrombocytopenia, unspecified 07/28/2012  . Acute respiratory failure 07/27/2012  . Narcotic overdose 07/27/2012  . 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 Unspecified hypothyroidism     Medications:  Prescriptions prior to admission  Medication Sig Dispense Refill  . amLODipine (NORVASC) 5 MG tablet Take 5 mg by mouth daily.      Marland Kitchen  aspirin EC 81 MG tablet Take 81 mg by mouth daily.      . benzocaine (ANBESOL) 10 % mucosal gel Use as directed 1 application in the mouth or throat as needed for pain (for tooth pain).      . Cholecalciferol (VITAMIN D-3) 1000 UNITS CAPS Take 3 capsules by mouth daily.      . divalproex (DEPAKOTE) 500 MG DR tablet Take 500 mg by mouth 2 (two) times daily.      . DULoxetine (CYMBALTA) 60 MG capsule Take 60 mg by mouth daily.      Marland Kitchen ipratropium-albuterol (DUONEB) 0.5-2.5 (3) MG/3ML SOLN Take 3 mLs by nebulization every 6 (six) hours as needed (for shortness of breath/wheezing).      . labetalol (NORMODYNE) 200 MG tablet Take 200 mg by mouth 2 (two) times daily.      Marland Kitchen levETIRAcetam (KEPPRA) 1000 MG tablet Take 2,000 mg by mouth 2 (two) times daily.      Marland Kitchen levothyroxine (SYNTHROID, LEVOTHROID)  137 MCG tablet Take 68.5 mcg by mouth every 7 (seven) days. Takes on wednesday      . losartan (COZAAR) 50 MG tablet Take 1 tablet (50 mg total) by mouth at bedtime.      Marland Kitchen olopatadine (PATANOL) 0.1 % ophthalmic solution Place 1 drop into both eyes 2 (two) times daily.      Marland Kitchen omeprazole (PRILOSEC) 20 MG capsule Take 20 mg by mouth daily.      . polyethylene glycol (MIRALAX / GLYCOLAX) packet Take 17 g by mouth daily.      Marland Kitchen PRESCRIPTION MEDICATION 1 Device by Intrathecal route continuous. Per nursing home [facility], patient had Baclofen and Morphine in her pain pump.      . simvastatin (ZOCOR) 20 MG tablet Take 20 mg by mouth at bedtime.      . cloNIDine (CATAPRES) 0.1 MG tablet Take 0.1 mg by mouth every 6 (six) hours as needed. SBP>160      . hydrOXYzine (ATARAX/VISTARIL) 25 MG tablet Take 12.5 mg by mouth daily as needed for itching or anxiety.         Thank you for allowing pharmacy to be a part of this patients care team.  Lovenia Kim Pharm.D., BCPS Clinical Pharmacist 08/01/2012 8:06 AM Pager: [phone] Phone: [phone]

## 2012-08-01 NOTE — ED Notes (Signed)
Per ems- pt from [facility]. Pt dc today from [place] with unintentional medication overdose from morphine and baclofen pump. The pump with not changed. Pt previously was on fentanyl and baclofen with no problems. Facility states pt had decreased respirations and 02 stats tonight.

## 2012-08-01 NOTE — ED Notes (Signed)
Pt more aware now. Stating that she is cold. Warm blanket given. Respirations increased.

## 2012-08-01 NOTE — Consult Note (Signed)
PULMONARY  / CRITICAL CARE MEDICINE  Name: [name] MRN: [id number] DOB: March 23, 1955    ADMISSION DATE:  08/01/2012 CONSULTATION DATE:  08/01/12  REFERRING MD :  Houston Siren  CHIEF COMPLAINT:  AMS and siezure  BRIEF PATIENT DESCRIPTION:  57 yo female transferred from SNF with altered mental status after recent hospital stay at [facility].  Initial concern was for accidental opiate overdose.  Developed seizure with VDRF shortly after admission, and PCCM assumed care on transfer to ICU 5/26.    PMHx of Hemorrhagic CVA with Lt sided weakness, chronic pain with indwelling morphine pain pump, HTN, Seizure disorder, OSA, Depression, DM, Hypothyroidism   SIGNIFICANT EVENTS: 5/26 Admit, given narcan for possible opiate OD, seizure, VDRF, neurology consulted  STUDIES:  5/26 CT head >> No acute finding. 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 Old infarctions in the right basal ganglia/external capsule and thalamus. Old infarction in the left frontal white matter with focal calcification. 5/26 EEG >>   LINES / TUBES: ETT 5/26 >> Lt IJ CVL 5/26 >>   CULTURES: 5/26 Blood >> 5/26 Urine >> 5/26 Sputum  ANTIBIOTICS: 5-26 vancomycin>> 5-26 zoysn>>  SUBJECTIVE:  Intermittently having seizure like activity on Rt side lasting about 15 to 20 seconds.  VITAL SIGNS: Temp:  [98.8 F (37.1 C)-101.1 F (38.4 C)] 99.9 F (37.7 C) (05/26 1400) Pulse Rate:  [66-111] 86 (05/26 1400) Resp:  [10-33] 14 (05/26 1400) BP: (118-174)/(73-104) 119/75 mmHg (05/26 1400) SpO2:  [95 %-100 %] 96 % (05/26 1400) FiO2 (%):  [40 %-100 %] 40 % (05/26 1300) Weight:  [242 lb 4.6 oz (109.9 kg)] 242 lb 4.6 oz (109.9 kg) (05/26 0524) VENTILATOR: Vent Mode:  [-] PRVC FiO2 (%):  [40 %-100 %] 40 % Set Rate:  [14 bmp] 14 bmp Vt Set:  [500 mL] 500 mL PEEP:  [5 cmH20] 5 cmH20 Plateau Pressure:  [26 cmH20] 26 cmH20 INTAKE / OUTPUT: Intake/Output     05/25 0701 - 05/26 0700 05/26 0701 - 05/27 0700   I.V.  (mL/kg) 125 (1.1) 228 (2.1)   IV Piggyback 110 610   Total Intake(mL/kg) 235 (2.1) 838 (7.6)   Urine (mL/kg/hr)  290 (0.4)   Total Output   290   Net +235 +548        Urine Occurrence 1 x    Stool Occurrence  1 x     PHYSICAL EXAMINATION: General: No distress Neuro: Intermittent Rt side seizure activity, not moving Lt side HEENT: Pinpoint pupils weakly reactive Cardiovascular: regular Lungs: scattered rhonchi Abdomen: soft, nontender, pain pump in LLQ Musculoskeletal: no edema Skin: no rashes  LABS:  Recent Labs Lab 07/27/12 1504  07/27/12 1535  07/28/12 0559  07/28/12 0830 07/28/12 1435 07/29/12 0452 07/29/12 0600 08/01/12 0045 08/01/12 0049 08/01/12 1032  HGB  --   < > 10.6*  --  11.1*  --   --   --  10.6*  --  10.2* 10.9*  --   WBC  --   < > 6.7  --  5.6  --   --   --  6.1  --  5.3  --   --   PLT  --   < > 142*  --  123*  --   --   --  124*  --  86*  --   --   NA  --   < > 145  --  143  --   --   --  143  --  142 144  --   K  --   < > 4.6  --  4.2  --   --   --  4.1  --  4.0 4.1  --   CL  --   < > 104  --  105  --   --   --  104  --  103 102  --   CO2  --   < > 33*  --  32  --   --   --  32  --  35*  --   --   GLUCOSE  --   < > 123*  --  85  --   --   --  82  --  98 91  --   BUN  --   < > 22  --  16  --   --   --  11  --  4* <3*  --   CREATININE  --   < > 1.04  --  0.63  --   --   --  0.56  --  0.52 0.70  --   CALCIUM  --   < > 9.5  --  8.9  --   --   --  8.8  --  8.8  --   --   MG  --   --   --   --   --   --  1.8  --   --   --   --   --   --   AST  --   --   --   --  20  --   --   --   --   --  13  --   --   ALT  --   --   --   --  6  --   --   --   --   --  5  --   --   ALKPHOS  --   --   --   --  41  --   --   --   --   --  33*  --   --   BILITOT  --   --   --   --  0.2*  --   --   --   --   --  0.2*  --   --   PROT  --   --   --   --  7.0  --   --   --   --   --  5.9*  --   --   ALBUMIN  --   --   --   --  3.1*  --   --   --   --   --  2.5*  --   --    LATICACIDVEN 1.7  --   --   --   --   --   --   --   --   --   --   --   --   TROPONINI  --   --  <0.30  --   --   --   --   --   --   --   --   --   --   PHART  --   --   --   < >  --   < >  --  7.296*  --  7.301*  --   --  7.418  PCO2ART  --   --   --   < >  --   < >  --  63.0*  --  65.6*  --   --  55.4*  PO2ART  --   --   --   < >  --   < >  --  88.4  --  81.5  --   --  256.0*  < > = values in this interval not displayed. No results found for this basename: GLUCAP,  in the last 168 hours  Imaging: Dg Forearm Left  08/01/2012   *RADIOLOGY REPORT*  Clinical Data: Left forearm pain; difficulty moving left arm.  LEFT FOREARM - 2 VIEW  Comparison: None.  Findings: There is no evidence of fracture or dislocation.  The radius and ulna appear intact.  The elbow joint is incompletely assessed, but appears grossly unremarkable.  There appears to be flattening of the carpal rows, though this is not well characterized due to limitations in positioning.  There is some degree of overlap on the lateral view, due to degenerative change along the radial aspect of the carpal rows.  IMPRESSION: No evidence of fracture or dislocation.  The carpal rows are not well assessed, with degenerative change noted along the radial aspect of the carpal rows.   Original Report Authenticated By: Tonia Ghent, M.D.   [facility] 1 View  08/01/2012   *RADIOLOGY REPORT*  Clinical Data: Fever.  Seizure.  PORTABLE CHEST - 1 VIEW  Comparison: 07/27/2012  Findings: Artifact overlies chest.  Heart size is normal.  The right lung is clear.  There may be mild volume loss in the left lower lobe.  No effusions.  No acute bony finding.  IMPRESSION: Possible atelectasis in the left lower lobe.   Original Report Authenticated By: Paulina Fusi, M.D.   Dg Chest Port 1 View  08/01/2012   *RADIOLOGY REPORT*  Clinical Data: Endotracheal tube placement.  PORTABLE CHEST - 1 VIEW  Comparison: 08/01/2012.  Findings: The endotracheal tube is in the  right mainstem bronchus and should be retracted approximately 5 cm.  The right IJ catheter tip is in the mid SVC.  The cardiac silhouette, mediastinal and hilar contours are stable.  Low lung volumes with vascular crowding and atelectasis.  Increase in left lung atelectasis likely due to the endotracheal tube position.  IMPRESSION:  1.  ET tube is in the right mainstem bronchus and should be retracted 5 cm. 2.  Slight increase in left lung atelectasis.  Findings called to the patient's nurse in the Neuro ICU.   Original Report Authenticated By: Rudie Meyer, M.D.   Dg Abd Portable 1v  08/01/2012   *RADIOLOGY REPORT*  Clinical Data: Orogastric tube placement.  PORTABLE ABDOMEN - 1 VIEW  Comparison: 03/12/2010.  Findings: The orogastric tube tip is in the body region of the stomach.  The endotracheal tube has been pulled back and the tip is 2 cm above the carina.  IMPRESSION: The endotracheal tube is 2 cm above the carina. The NG tube tip is in the body region of the stomach.   Original Report Authenticated By: Rudie Meyer, M.D.     ASSESSMENT / PLAN: NEUROLOGIC A:  Recurrent seizure. Hx of hemorrhagic CVA with Lt sided hemiplegia. Chronic pain with chronic pain pump. Acute encephalopathy 2nd to accidental opiate overdose. P:   -diprivan, fentanyl for sedation -f/u EEG -continue keppra, depacon per neurology -aggressively control fever -morphine pump at minimal setting to maintain pump  patency -defer decision to resume ASA to neurology -hold cymbalta  PULMONARY A: Acute respiratory failure 2nd to presumed aspiration pneumonitis and inability to protect airway. Hx of OSA. P:   -full vent support for now -f/u CXR -prn BD's  CARDIOVASCULAR A: Hx of HTN, hyperlipidemia. P:  -continue catapress patch -resume amlodipine, labetalol, cozaar, zocor  RENAL A: No issues. P:   -monitor renal fx, urine outpt, electrolytes  GASTROINTESTINAL A:  Nutrition. P:   -tube feeds while on  vent -protonix for SUP  HEMATOLOGIC A: Anemia, thrombocytopenia. P:  -f/u CBC -SCD for DVT prevention  INFECTIOUS A:  Concern for aspiration pneumonitis. P:   -D1/x vancomycin, zosyn  ENDOCRINE A:  DM type II. Hx of hypothyroidism. P:   -SSI -continue synthroid  CC time 40 minutes.  Coralyn Helling, MD [facility] [place] Pulmonary/Critical Care 08/01/2012, 2:44 PM Pager:  [phone] After 3pm call: [phone]

## 2012-08-01 NOTE — Progress Notes (Signed)
EEG Completed

## 2012-08-01 NOTE — Procedures (Addendum)
EEG report.  Brief clinical history: 57 years old female with prior strokemand symptomatic seizures admitted with altered mental status and witnessed seizure today. On IV keppra and IV depacon.   Technique: this is a 17 channel routine scalp EEG performed at the bedside in the ICU setting, with bipolar and monopolar montages arranged in accordance to the international 10/20 system of electrode placement. One channel was dedicated to EKG recording.  The study was performed during wakefulness, drowsiness, and stage 2 sleep. Patient is intubated on the vent.   Description:As the study begins and throughout the entire recording, there is diffuse, continuous low voltage 7-8 Hz activity that doesn't follow an ictal pattern and is not associated with intermixed epileptiform discharges. Patient jerking movements during recording were not associated with epileptiform discharges. EKG showed sinus rhythm.  Impression: this is an abnormal EEG with findings consistent with a mild encephalopathy, non specific. Patient had several episodes of jerking movements that were not associated with concomitant EEG changes.Please, be aware that the absence of epileptiform discharges does not exclude the possibility of epilepsy.  Clinical correlation is advised.  Wyatt Portela, MD

## 2012-08-01 NOTE — Consult Note (Addendum)
PULMONARY  / CRITICAL CARE MEDICINE  Name: [name] MRN: [id number] DOB: 24-Jan-1956    ADMISSION DATE:  08/01/2012 CONSULTATION DATE:  08/01/12  REFERRING MD :  Houston Siren PRIMARY SERVICE: [facility]  CHIEF COMPLAINT:  AMS and siezure  BRIEF PATIENT DESCRIPTION: 57 yo F with h/o stroke, chronic pain treated with baclofen and morphine pump.  SIGNIFICANT EVENTS / STUDIES:  5/26 Admitted on narcan gtt, seizure  LINES / TUBES: PIVs  CULTURES: Urine Cx 5/26  ANTIBIOTICS: None  HISTORY OF PRESENT ILLNESS:  57 yo F with MMP including history of stroke, seizure disorder, and chronic pain/spacity requiring baclofen and narcotic pump brought to the ED with unresponsiveness. She was [facility] with [facility] mental status attributed to narcotic overdose.  She was treated with a Narcan gtt for 24 hours and discharged to her SNF.  She was brought back to the ED for unresponsiveness.  In the ED she was given narcan with response and she was admitted on a narcan gtt.    Shortly after arrival to ICU her head turned to the left and had twitching of her face.  She was given 3 mg of ativan.  Now she has tremor of her right arm, and semipurposeful movement of her right arm/leg and reacts to noxious stimuli.  PAST MEDICAL HISTORY :  Past Medical History  Diagnosis Date  . Hypertension   . Seizure disorder   . Metabolic encephalopathy 05/06/2010  . Respiratory failure 05/06/10  . UTI (lower urinary tract infection) 05/06/10  . 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 Sleep apnea 04/2010    on CPAP, "severe central sleep apnea"  . 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 Chronic pain   . Depression   . 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 Spasticity     chronic  . Diabetes mellitus without complication   . Thrombocytopenia, unspecified 07/28/2012  . Acute respiratory failure 07/27/2012  . Narcotic overdose 07/27/2012  . Hemiparesis  affecting left side as late effect of cerebrovascular accident   . Unspecified hypothyroidism    Past Surgical History  Procedure Laterality Date  . Intrathecal pump implantation  2010    [employer]:  fentanyl, baclofen changed to morphine and baclofen on 07/2012  . Cerebral aneurysm repair  1999   Prior to Admission medications   Medication Sig Start Date End Date Taking? Authorizing Provider  amLODipine (NORVASC) 5 MG tablet Take 5 mg by mouth daily.   Yes Historical Provider, MD  aspirin EC 81 MG tablet Take 81 mg by mouth daily.   Yes Historical Provider, MD  benzocaine (ANBESOL) 10 % mucosal gel Use as directed 1 application in the mouth or throat as needed for pain (for tooth pain).   Yes Historical Provider, MD  Cholecalciferol (VITAMIN D-3) 1000 UNITS CAPS Take 3 capsules by mouth daily.   Yes Historical Provider, MD  divalproex (DEPAKOTE) 500 MG DR tablet Take 500 mg by mouth 2 (two) times daily.   Yes Historical Provider, MD  DULoxetine (CYMBALTA) 60 MG capsule Take 60 mg by mouth daily.   Yes Historical Provider, MD  ipratropium-albuterol (DUONEB) 0.5-2.5 (3) MG/3ML SOLN Take 3 mLs by nebulization every 6 (six) hours as needed (for shortness of breath/wheezing).   Yes Historical Provider, MD  labetalol (NORMODYNE) 200 MG tablet Take 200 mg by mouth 2 (two) times daily.   Yes Historical Provider, MD  levETIRAcetam (KEPPRA) 1000 MG  tablet Take 2,000 mg by mouth 2 (two) times daily.   Yes Historical Provider, MD  levothyroxine (SYNTHROID, LEVOTHROID) 137 MCG tablet Take 68.5 mcg by mouth every 7 (seven) days. Takes on wednesday   Yes Historical Provider, MD  losartan (COZAAR) 50 MG tablet Take 1 tablet (50 mg total) by mouth at bedtime. 07/31/12  Yes Elliot Cousin, MD  olopatadine (PATANOL) 0.1 % ophthalmic solution Place 1 drop into both eyes 2 (two) times daily.   Yes Historical Provider, MD  omeprazole (PRILOSEC) 20 MG capsule Take 20 mg by mouth daily.   Yes Historical Provider, MD   polyethylene glycol (MIRALAX / GLYCOLAX) packet Take 17 g by mouth daily.   Yes Historical Provider, MD  PRESCRIPTION MEDICATION 1 Device by Intrathecal route continuous. Per nursing home [facility], patient had Baclofen and Morphine in her pain pump.   Yes Historical Provider, MD  simvastatin (ZOCOR) 20 MG tablet Take 20 mg by mouth at bedtime.   Yes Historical Provider, MD  cloNIDine (CATAPRES) 0.1 MG tablet Take 0.1 mg by mouth every 6 (six) hours as needed. SBP>160    Historical Provider, MD  hydrOXYzine (ATARAX/VISTARIL) 25 MG tablet Take 12.5 mg by mouth daily as needed for itching or anxiety.    Historical Provider, MD   Allergies  Allergen Reactions  . Codeine Other (See Comments)    unknown    FAMILY HISTORY:  No family history on file. SOCIAL HISTORY:  reports that she has never smoked. She does not have any smokeless tobacco history on file. She reports that she does not drink alcohol or use illicit drugs.  REVIEW OF SYSTEMS:  Unable to obtain due to patients AMS  SUBJECTIVE:   VITAL SIGNS: Temp:  [98.7 F (37.1 C)-98.8 F (37.1 C)] 98.8 F (37.1 C) (05/26 0025) Pulse Rate:  [72-92] 72 (05/26 0430) Resp:  [10-23] 23 (05/26 0430) BP: (106-169)/(69-100) 169/100 mmHg (05/26 0524) SpO2:  [90 %-98 %] 98 % (05/26 0025) Weight:  [109.9 kg (242 lb 4.6 oz)] 109.9 kg (242 lb 4.6 oz) (05/26 0524) INTAKE / OUTPUT: Intake/Output   None     PHYSICAL EXAMINATION: General:  Lying in bed Neuro:  Moans to tactile stimuli. Left HEENT:  PERRL, MMM Cardiovascular:  RRR, no m/r/g Lungs:  CTAB Abdomen:  Obese, soft, NT, ND Musculoskeletal:  Contracted ane LUE and bilateral LEs Skin:  No skin breakdown  LABS:  Recent Labs Lab 07/27/12 1504  07/27/12 1535  07/28/12 0559 07/28/12 0720 07/28/12 0830 07/28/12 1435 07/29/12 0452 07/29/12 0600 08/01/12 0045 08/01/12 0049  HGB  --   < > 10.6*  --  11.1*  --   --   --  10.6*  --  10.2* 10.9*  WBC  --   < > 6.7  --  5.6  --   --    --  6.1  --  5.3  --   PLT  --   < > 142*  --  123*  --   --   --  124*  --  86*  --   NA  --   < > 145  --  143  --   --   --  143  --  142 144  K  --   < > 4.6  --  4.2  --   --   --  4.1  --  4.0 4.1  CL  --   < > 104  --  105  --   --   --  104  --  103 102  CO2  --   < > 33*  --  32  --   --   --  32  --  35*  --   GLUCOSE  --   < > 123*  --  85  --   --   --  82  --  98 91  BUN  --   < > 22  --  16  --   --   --  11  --  4* <3*  CREATININE  --   < > 1.04  --  0.63  --   --   --  0.56  --  0.52 0.70  CALCIUM  --   < > 9.5  --  8.9  --   --   --  8.8  --  8.8  --   MG  --   --   --   --   --   --  1.8  --   --   --   --   --   AST  --   --   --   --  20  --   --   --   --   --  13  --   ALT  --   --   --   --  6  --   --   --   --   --  5  --   ALKPHOS  --   --   --   --  41  --   --   --   --   --  33*  --   BILITOT  --   --   --   --  0.2*  --   --   --   --   --  0.2*  --   PROT  --   --   --   --  7.0  --   --   --   --   --  5.9*  --   ALBUMIN  --   --   --   --  3.1*  --   --   --   --   --  2.5*  --   LATICACIDVEN 1.7  --   --   --   --   --   --   --   --   --   --   --   TROPONINI  --   --  <0.30  --   --   --   --   --   --   --   --   --   PHART  --   --   --   < >  --  7.280*  --  7.296*  --  7.301*  --   --   PCO2ART  --   --   --   < >  --  66.7*  --  63.0*  --  65.6*  --   --   PO2ART  --   --   --   < >  --  80.4  --  88.4  --  81.5  --   --   < > = values in this interval not displayed. No results found for this basename: GLUCAP,  in the last 168 hours  CXR: pending  ASSESSMENT / PLAN:  1. Seizure - with known seizure d/o - S/p ativan 3  mg, now appears post ictal - Kepra 1000 mg BID, getting dose now - CXR and UA to evaluate for infections that could have lowered seizure threshold  2. AMS - likely from narcotic overdose related to pump, now with worsening MAS 2/2 ativan and post ictal state. - Continue narcan gtt - Will need to contact [employer] to hold or  decrease pump setting.    I have personally obtained a history, examined the patient, evaluated laboratory and imaging results, formulated the assessment and plan and placed orders. CRITICAL CARE: The patient is critically ill  and requires high complexity decision making for assessment and support, frequent evaluation and titration of therapies, application of advanced monitoring technologies and extensive interpretation of multiple databases. Critical Care Time devoted to patient care services described in this note is 40 minutes.   Emya Picado M.D. Pulmonary and Critical Care Medicine [facility] Pager: [phone]  08/01/2012, 5:57 AM

## 2012-08-01 NOTE — Procedures (Signed)
Intubation Procedure Note [name] [id number] 01-Feb-1956  Procedure: Intubation Indications: Respiratory insufficiency  Procedure Details Consent: Risks of procedure as well as the alternatives and risks of each were explained to the (patient/caregiver).  Consent for procedure obtained. Time Out: Verified patient identification, verified procedure, site/side was marked, verified correct patient position, special equipment/implants available, medications/allergies/relevent history reviewed, required imaging and test results available.  Performed  3 Medications:  Fentanyl 100 mcg Etomidate 30 mg Versed 2 mg NMB rocuronium 50 mg    Evaluation Hemodynamic Status: BP stable throughout; O2 sats: stable throughout Patient's Current Condition: stable Complications: No apparent complications Patient did tolerate procedure well. Chest X-ray ordered to verify placement.  CXR: pending.   Brett Canales Minor ACNP Adolph Pollack PCCM Pager [phone] till 3 pm If no answer page [phone] 08/01/2012, 8:29 AM  I was present for this procedure [name] WrightMD

## 2012-08-01 NOTE — Consult Note (Addendum)
NEURO HOSPITALIST CONSULT NOTE    Reason for Consult: seizures. HPI:                                                                                                                                          [name] is an 57 y.o. 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 She was intubated ad transferred to ICU, and while in the ICU she was noted to have paroxysmal, twitching movements right face with head version to the left that resolved after receiving 3 mg IV ativan and loading dose keppra 1 gram.She remains exhibiting frequent, intermittent tremor/myoclonic like movements righ hemibody. Clinical notes from [facility] were reviewed. No further information regarding her seizure history is available at this moment, but she apparently takes Depakote 500 mg BID.  Valproic acid level 5/24: low therapeutic 50.1 CT brain pending. A neurological consultation was requested to assist with seizure management.      Past Medical History  Diagnosis Date  . Hypertension   . Seizure disorder   . Metabolic encephalopathy 05/06/2010  . Respiratory failure 05/06/10  . UTI (lower urinary tract infection) 05/06/10  . 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 Sleep apnea 04/2010    on CPAP, "severe central sleep apnea"  . 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 Chronic pain   . Depression   . 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 Spasticity     chronic  . Diabetes mellitus without complication   . Thrombocytopenia, unspecified 07/28/2012  . Acute  respiratory failure 07/27/2012  . Narcotic overdose 07/27/2012  . 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 Unspecified hypothyroidism     Past Surgical History  Procedure Laterality Date  . Intrathecal pump implantation  2010    [employer]:  fentanyl, baclofen changed to morphine and baclofen on 07/2012  . Cerebral aneurysm repair  1999    No family history on file.  Family History:   Social History:  reports that she has never smoked. She does not have any smokeless tobacco history on file. She reports that she does not drink alcohol or use illicit drugs.  Allergies  Allergen Reactions  . Codeine Other (See Comments)    unknown    MEDICATIONS:  I have reviewed the patient's current medications.   ROS: unable to obtain due to patient mental status.                                                                                                                                      History obtained from chart review. unobtainable from patient due to mental status.    Physical exam: intubated on the vent.Blood pressure 163/104, pulse 95, temperature 100.7 F (38.2 C), temperature source Axillary, resp. rate 17, height 5\' 2"  (1.575 m), weight 109.9 kg (242 lb 4.6 oz), SpO2 100.00%.  Head: normocephalic. Neck: supple, no bruits, no JVD. Cardiac: no murmurs. Lungs: clear. Abdomen: soft, no tender, no mass. Extremities: no edema.  Blood pressure 163/104, pulse 95, temperature 100.7 F (38.2 C), temperature source Axillary, resp. rate 17, height 5\' 2"  (1.575 m), weight 109.9 kg (242 lb 4.6 oz), SpO2 100.00%.   Neurologic Examination:                                                                                                      Mental status: she open eyes on verbal commands and is able to follow simple commands. CN 212:  pupils 3-4 mm, reactive to light. No gaze preference. EOM  full without nystagmus. Blinks to threat. Face is symmetric. Tongue: intubated. Motor:  Dense left hemiplegia residual from prior stroke. Sensory: withdraws to pain. DTR's: hyperreflexia left side. Couldn't elicit in the right. Plantars: left upgoing, right downgoing. Coordination and gait: unable to test. No meningeal irritation signs.     Lab Results  Component Value Date/Time   CHOL  Value: 289        ATP III CLASSIFICATION:  <200     mg/dL   Desirable  161-096  mg/dL   Borderline High  >=045    mg/dL   High       * 06/15/8117  6:42 AM    Results for orders placed during the hospital encounter of 08/01/12 (from the past 48 hour(s))  CBC     Status: Abnormal   Collection Time    08/01/12 12:45 AM      Result Value Range   WBC 5.3  4.0 - 10.5 K/uL   RBC 3.34 (*) 3.87 - 5.11 MIL/uL   Hemoglobin 10.2 (*) 12.0 - 15.0 g/dL   HCT 14.7 (*) 82.9 - 56.2 %   MCV 97.6  78.0 - 100.0 fL   MCH 30.5  26.0 - 34.0 pg  MCHC 31.3  30.0 - 36.0 g/dL   RDW 95.6  21.3 - 08.6 %   Platelets 86 (*) 150 - 400 K/uL   Comment: PLATELET COUNT CONFIRMED BY SMEAR  COMPREHENSIVE METABOLIC PANEL     Status: Abnormal   Collection Time    08/01/12 12:45 AM      Result Value Range   Sodium 142  135 - 145 mEq/L   Potassium 4.0  3.5 - 5.1 mEq/L   Chloride 103  96 - 112 mEq/L   CO2 35 (*) 19 - 32 mEq/L   Glucose, Bld 98  70 - 99 mg/dL   BUN 4 (*) 6 - 23 mg/dL   Creatinine, Ser 5.78  0.50 - 1.10 mg/dL   Calcium 8.8  8.4 - 46.9 mg/dL   Total Protein 5.9 (*) 6.0 - 8.3 g/dL   Albumin 2.5 (*) 3.5 - 5.2 g/dL   AST 13  0 - 37 U/L   ALT 5  0 - 35 U/L   Alkaline Phosphatase 33 (*) 39 - 117 U/L   Total Bilirubin 0.2 (*) 0.3 - 1.2 mg/dL   GFR calc non Af Amer >90  >90 mL/min   GFR calc Af Amer >90  >90 mL/min   Comment:            The eGFR has been calculated     using the CKD EPI equation.     This calculation has not been     validated in all clinical      situations.     eGFR's persistently     <90 mL/min signify     possible Chronic Kidney Disease.  POCT I-STAT, CHEM 8     Status: Abnormal   Collection Time    08/01/12 12:49 AM      Result Value Range   Sodium 144  135 - 145 mEq/L   Potassium 4.1  3.5 - 5.1 mEq/L   Chloride 102  96 - 112 mEq/L   BUN <3 (*) 6 - 23 mg/dL   Creatinine, Ser 6.29  0.50 - 1.10 mg/dL   Glucose, Bld 91  70 - 99 mg/dL   Calcium, Ion 5.28  4.13 - 1.23 mmol/L   TCO2 36  0 - 100 mmol/L   Hemoglobin 10.9 (*) 12.0 - 15.0 g/dL   HCT 24.4 (*) 01.0 - 27.2 %    Dg Forearm Left  08/01/2012   *RADIOLOGY REPORT*  Clinical Data: Left forearm pain; difficulty moving left arm.  LEFT FOREARM - 2 VIEW  Comparison: None.  Findings: There is no evidence of fracture or dislocation.  The radius and ulna appear intact.  The elbow joint is incompletely assessed, but appears grossly unremarkable.  There appears to be flattening of the carpal rows, though this is not well characterized due to limitations in positioning.  There is some degree of overlap on the lateral view, due to degenerative change along the radial aspect of the carpal rows.  IMPRESSION: No evidence of fracture or dislocation.  The carpal rows are not well assessed, with degenerative change noted along the radial aspect of the carpal rows.   Original Report Authenticated By: Tonia Ghent, M.D.   [facility] 1 View  08/01/2012   *RADIOLOGY REPORT*  Clinical Data: Fever.  Seizure.  PORTABLE CHEST - 1 VIEW  Comparison: 07/27/2012  Findings: Artifact overlies chest.  Heart size is normal.  The right lung is clear.  There may be mild volume loss in the  left lower lobe.  No effusions.  No acute bony finding.  IMPRESSION: Possible atelectasis in the left lower lobe.   Original Report Authenticated By: Paulina Fusi, M.D.   Dg Chest Port 1 View  08/01/2012   *RADIOLOGY REPORT*  Clinical Data: Endotracheal tube placement.  PORTABLE CHEST - 1 VIEW  Comparison: 08/01/2012.   Findings: The endotracheal tube is in the right mainstem bronchus and should be retracted approximately 5 cm.  The right IJ catheter tip is in the mid SVC.  The cardiac silhouette, mediastinal and hilar contours are stable.  Low lung volumes with vascular crowding and atelectasis.  Increase in left lung atelectasis likely due to the endotracheal tube position.  IMPRESSION:  1.  ET tube is in the right mainstem bronchus and should be retracted 5 cm. 2.  Slight increase in left lung atelectasis.  Findings called to the patient's nurse in the Neuro ICU.   Original Report Authenticated By: Rudie Meyer, M.D.     Assessment/Plan: 57 years old with known history of symptomatic post stroke GTC seizures on Depakote, admitted with altered mental status in the context of likely toxic encephalopathy. Had a witnessed seizure earlier today and remains exhibiting frequent, intermittent tremor/myoclonic like movements righ hemibody that in this particular scenario could certainly represent myoclonic seizures. I think that patient toxic encephalopathy is the contributing factor to current seizures/myoclonus Recommend: 1) EEG 2) continue keppra 1,000 mg BID 3) Recent valproic acid level low therapeutic and thus will give her a loading dose 1 gram IV Depacon now and check valproic acid level in am. Will adjust Depakote depending on serum level. 4) CT brain ( already ordered and pending) 4) will follow up.     Ermalene Postin [facility] [phone]  08/01/2012, 9:45 AM    Addendum: EEG consistent with mild encephalopathy. No evidence of electrographic seizures.  Wyatt Portela, MD

## 2012-08-01 NOTE — H&P (Signed)
[facility] History and Physical  [name] BJY:[id number] DOB: Nov 09, 1955    PCP:   Terald Sleeper, MD   Chief Complaint: respiratory failure.  HPI: [name] is a 57 y.o. female to the past medical history of stroke, chronic spasticity and chronic pain syndrome with baclofen and narcotic pump in place. She also has a history of hypertension, and seizure disorder. She lives in a skilled nursing facility and was found to be unresponsive earlier today. She was discharged from [facility] after having respiratory suppression, requiring 24 hours of Narcan drip in the ICU there, and discharged yesterday around 2 pm.  She was doing well, then suddenly breathing at 3x per minute according to her daughter.  She was noted to have apneic spells. She was given one dose of narcan and became more alert. Apparently, she was recently changed from Fentanyl to Morphine, and during her last admission, it was programed to the lowest dose.  The change from Fentanyl to Morphine was not entirely clear. The plan was to switch her back to Fentanyl.  She is paralyzed on the left sided due to her stroke. It was a hemorrhagic stroke.  Rewiew of Systems:  Constitutional: Negative for malaise, fever and chills. No significant weight loss or weight gain Eyes: Negative for eye pain, redness and discharge, diplopia, visual changes, or flashes of light. ENMT: Negative for ear pain, hoarseness, nasal congestion, sinus pressure and sore throat. No headaches; tinnitus, drooling, or problem swallowing. Cardiovascular: Negative for chest pain, palpitations, diaphoresis, dyspnea and peripheral edema. ; No orthopnea, PND Respiratory: Negative for cough, hemoptysis, wheezing and stridor. No pleuritic chestpain. Gastrointestinal: Negative for nausea, vomiting, diarrhea, constipation, abdominal pain, melena, blood in stool, hematemesis, jaundice and rectal bleeding.    Genitourinary: Negative for frequency, dysuria,  incontinence,flank pain and hematuria; Musculoskeletal: Negative for swelling and trauma.;  Skin: . Negative for pruritus, rash, abrasions, bruising and skin lesion.; ulcerations Neuro: Negative for headache, lightheadedness and neck stiffness. Negative for weakness, altered level of consciousness , altered mental status, extremity weakness, burning feet, involuntary movement, seizure and syncope.  Psych: negative for anxiety, depression, insomnia, tearfulness, panic attacks, hallucinations, paranoia, suicidal or homicidal ideation    Past Medical History  Diagnosis Date  . Hypertension   . Seizure disorder   . Metabolic encephalopathy 05/06/2010  . Respiratory failure 05/06/10  . UTI (lower urinary tract infection) 05/06/10  . 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 Sleep apnea 04/2010    on CPAP, "severe central sleep apnea"  . 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 Chronic pain   . Depression   . 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 Spasticity     chronic  . Diabetes mellitus without complication   . Thrombocytopenia, unspecified 07/28/2012  . Acute respiratory failure 07/27/2012  . Narcotic overdose 07/27/2012  . 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 Unspecified hypothyroidism     Past Surgical History  Procedure Laterality Date  . Intrathecal pump implantation  2010    [employer]:  fentanyl, baclofen changed to morphine and baclofen on 07/2012  . Cerebral aneurysm repair  1999    Medications:  HOME MEDS: Prior to Admission medications   Medication Sig Start Date End Date Taking? Authorizing Provider  amLODipine (NORVASC) 5 MG tablet Take 5 mg by mouth daily.   Yes Historical Provider, MD  aspirin EC 81 MG tablet Take 81 mg by mouth daily.  Yes Historical Provider, MD  benzocaine (ANBESOL) 10 % mucosal gel Use as directed 1 application in the mouth or throat as needed for pain (for tooth pain).   Yes  Historical Provider, MD  Cholecalciferol (VITAMIN D-3) 1000 UNITS CAPS Take 3 capsules by mouth daily.   Yes Historical Provider, MD  divalproex (DEPAKOTE) 500 MG DR tablet Take 500 mg by mouth 2 (two) times daily.   Yes Historical Provider, MD  DULoxetine (CYMBALTA) 60 MG capsule Take 60 mg by mouth daily.   Yes Historical Provider, MD  ipratropium-albuterol (DUONEB) 0.5-2.5 (3) MG/3ML SOLN Take 3 mLs by nebulization every 6 (six) hours as needed (for shortness of breath/wheezing).   Yes Historical Provider, MD  labetalol (NORMODYNE) 200 MG tablet Take 200 mg by mouth 2 (two) times daily.   Yes Historical Provider, MD  levETIRAcetam (KEPPRA) 1000 MG tablet Take 2,000 mg by mouth 2 (two) times daily.   Yes Historical Provider, MD  levothyroxine (SYNTHROID, LEVOTHROID) 137 MCG tablet Take 68.5 mcg by mouth every 7 (seven) days. Takes on wednesday   Yes Historical Provider, MD  losartan (COZAAR) 50 MG tablet Take 1 tablet (50 mg total) by mouth at bedtime. 07/31/12  Yes Elliot Cousin, MD  olopatadine (PATANOL) 0.1 % ophthalmic solution Place 1 drop into both eyes 2 (two) times daily.   Yes Historical Provider, MD  omeprazole (PRILOSEC) 20 MG capsule Take 20 mg by mouth daily.   Yes Historical Provider, MD  polyethylene glycol (MIRALAX / GLYCOLAX) packet Take 17 g by mouth daily.   Yes Historical Provider, MD  PRESCRIPTION MEDICATION 1 Device by Intrathecal route continuous. Per nursing home [facility], patient had Baclofen and Morphine in her pain pump.   Yes Historical Provider, MD  simvastatin (ZOCOR) 20 MG tablet Take 20 mg by mouth at bedtime.   Yes Historical Provider, MD  cloNIDine (CATAPRES) 0.1 MG tablet Take 0.1 mg by mouth every 6 (six) hours as needed. SBP>160    Historical Provider, MD  hydrOXYzine (ATARAX/VISTARIL) 25 MG tablet Take 12.5 mg by mouth daily as needed for itching or anxiety.    Historical Provider, MD     Allergies:  Allergies  Allergen Reactions  . Codeine Other (See Comments)     unknown    Social History:   reports that she has never smoked. She does not have any smokeless tobacco history on file. She reports that she does not drink alcohol or use illicit drugs.  Family History: No family history on file.   Physical Exam: Filed Vitals:   08/01/12 0030 08/01/12 0115 08/01/12 0300 08/01/12 0430  BP: 125/78 155/98 130/79 163/93  Pulse: 84 76 88 72  Temp:      TempSrc:      Resp: 11 21 10 23   SpO2:       Blood pressure 163/93, pulse 72, temperature 98.8 F (37.1 C), temperature source Oral, resp. rate 23, SpO2 98.00%.  GEN:  Pleasant patient lying in the stretcher in no acute distress; cooperative with exam. PSYCH:  alert and oriented x4; does not appear anxious or depressed; affect is appropriate. HEENT: Mucous membranes pink and anicteric; PERRLA; EOM intact; no cervical lymphadenopathy nor thyromegaly or carotid bruit; no JVD; There were no stridor. Neck is very supple. Breasts:: Not examined CHEST WALL: No tenderness CHEST: Normal respiration, clear to auscultation bilaterally.  HEART: Regular rate and rhythm.  There are no murmur, rub, or gallops.   BACK: No kyphosis or scoliosis; no CVA tenderness ABDOMEN: soft  and non-tender; no masses, no organomegaly, normal abdominal bowel sounds; no pannus; no intertriginous candida. There is no rebound and no distention. Rectal Exam: Not done EXTREMITIES:  no edema; no ulcerations.  There is no calf tenderness. Genitalia: not examined PULSES: 2+ and symmetric SKIN: Normal hydration no rash or ulceration CNS: SHe is alert now. Speech is fluent, she has myotonic jerks. Labs on Admission:  Basic Metabolic Panel:  Recent Labs Lab 07/27/12 1535 07/28/12 0559 07/28/12 0830 07/29/12 0452 08/01/12 0045 08/01/12 0049  NA 145 143  --  143 142 144  K 4.6 4.2  --  4.1 4.0 4.1  CL 104 105  --  104 103 102  CO2 33* 32  --  32 35*  --   GLUCOSE 123* 85  --  82 98 91  BUN 22 16  --  11 4* <3*  CREATININE  1.04 0.63  --  0.56 0.52 0.70  CALCIUM 9.5 8.9  --  8.8 8.8  --   MG  --   --  1.8  --   --   --    Liver Function Tests:  Recent Labs Lab 07/28/12 0559 08/01/12 0045  AST 20 13  ALT 6 5  ALKPHOS 41 33*  BILITOT 0.2* 0.2*  PROT 7.0 5.9*  ALBUMIN 3.1* 2.5*   No results found for this basename: LIPASE, AMYLASE,  in the last 168 hours No results found for this basename: AMMONIA,  in the last 168 hours CBC:  Recent Labs Lab 07/27/12 1535 07/28/12 0559 07/29/12 0452 08/01/12 0045 08/01/12 0049  WBC 6.7 5.6 6.1 5.3  --   NEUTROABS 4.8  --   --   --   --   HGB 10.6* 11.1* 10.6* 10.2* 10.9*  HCT 34.3* 36.7 34.1* 32.6* 32.0*  MCV 98.3 97.6 96.6 97.6  --   PLT 142* 123* 124* 86*  --    Cardiac Enzymes:  Recent Labs Lab 07/27/12 1535  TROPONINI <0.30    CBG: No results found for this basename: GLUCAP,  in the last 168 hours   Radiological Exams on Admission: Dg Forearm Left  08/01/2012   *RADIOLOGY REPORT*  Clinical Data: Left forearm pain; difficulty moving left arm.  LEFT FOREARM - 2 VIEW  Comparison: None.  Findings: There is no evidence of fracture or dislocation.  The radius and ulna appear intact.  The elbow joint is incompletely assessed, but appears grossly unremarkable.  There appears to be flattening of the carpal rows, though this is not well characterized due to limitations in positioning.  There is some degree of overlap on the lateral view, due to degenerative change along the radial aspect of the carpal rows.  IMPRESSION: No evidence of fracture or dislocation.  The carpal rows are not well assessed, with degenerative change noted along the radial aspect of the carpal rows.   Original Report Authenticated By: Tonia Ghent, M.D.    Assessment/Plan Present on Admission:  . Seizure disorder . Acute respiratory failure . Morbid obesity . Chronic pain disorder . Central sleep apnea . Thrombocytopenia, unspecified . Unspecified hypothyroidism  PLAN:  I  will start her on Narcan drip as the delay in respiratory suppression is worrisome.  She went from talking and laughing to respiratory arrest at the nursing home.  She already down on the lowest dose, so likely, she will need to be back on Fentanyl.  With the Narcan drip, she is stable.  She should be under PCCM service because narcan  drip reportedly should be admitted into our closed ICU.  I have spoken with PCCM, and the plan would be for me to admit this patient to PCCM service.  Thank you for allowing me to partake in the care of this nice patient.    Other plans as per orders.  Code Status: FULL Unk Lightning, MD. [facility] Pager [phone] 7pm to 7am.  08/01/2012, 4:35 AM

## 2012-08-01 NOTE — ED Provider Notes (Signed)
History     CSN: [id number]  Arrival date & time 08/01/12  0011   First MD Initiated Contact with Patient 08/01/12 0015      Chief Complaint  Patient presents with  . Medication Problem    (Consider location/radiation/quality/duration/timing/severity/associated sxs/prior treatment) HPI Per EMS and NH report, pt with AMS, decreased respirations and low oxygen saturations - she has a pain pump with h/o narcotic OD, was just discharged from [facility] hospital today - family requesting PT be brought to [facility].   Pt with significant hx of chronic pain with "implanted pain pump," - she had been taking oxycodone PO for breakthrough pain. She required narcan drip with last admit. Last PO pain medications while an inpatient at [facility]  H/o encephalopathy, reported baseline mentation, patient does provide limited history - unable to say what happened today at the NH, now states baseline pain  Daughter bedside saw her today at the NH concerned that her breathing was shallow and that her pump and morphine recently switched from fentanyl may be too much medication for her. Requires BiPAP at night. Her baclofen narcotic pump was placed at [facility]  Past Medical History  Diagnosis Date  . Hypertension   . Seizure disorder   . Metabolic encephalopathy 05/06/2010  . Respiratory failure 05/06/10  . UTI (lower urinary tract infection) 05/06/10  . 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 Sleep apnea 04/2010    on CPAP, "severe central sleep apnea"  . 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 Chronic pain   . Depression   . 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 Spasticity     chronic  . Diabetes mellitus without complication   . Thrombocytopenia, unspecified 07/28/2012  . Acute respiratory failure 07/27/2012  . Narcotic overdose 07/27/2012  . 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 Unspecified hypothyroidism     Past Surgical History  Procedure  Laterality Date  . Intrathecal pump implantation  2010    [employer]:  fentanyl, baclofen changed to morphine and baclofen on 07/2012  . Cerebral aneurysm repair  1999    No family history on file.  History  Substance Use Topics  . Smoking status: Never Smoker   . Smokeless tobacco: Not on file  . Alcohol Use: No    OB History   Grav Para Term Preterm Abortions TAB SAB Ect Mult Living                  Review of Systems  Constitutional: Negative for fever and chills.  HENT: Negative for neck pain and neck stiffness.   Eyes: Negative for pain.  Respiratory: Negative for cough and wheezing.   Cardiovascular: Negative for chest pain.  Gastrointestinal: Negative for vomiting and abdominal pain.  Genitourinary: Negative for dysuria.  Musculoskeletal: Negative for back pain.  Skin: Negative for rash.  Neurological: Negative for headaches.  All other systems reviewed and are negative.    Allergies  Codeine  Home Medications   Current Outpatient Rx  Name  Route  Sig  Dispense  Refill  . cloNIDine (CATAPRES) 0.1 MG tablet   Oral   Take 0.1 mg by mouth every 6 (six) hours as needed. SBP>160         . divalproex (DEPAKOTE) 500 MG DR tablet   Oral   Take 500 mg by mouth 2 (two) times daily.         . hydrOXYzine (ATARAX/VISTARIL)  25 MG tablet   Oral   Take 12.5 mg by mouth daily as needed for itching or anxiety.         Marland Kitchen labetalol (NORMODYNE) 200 MG tablet   Oral   Take 200 mg by mouth 2 (two) times daily.         Marland Kitchen levETIRAcetam (KEPPRA) 1000 MG tablet   Oral   Take 2,000 mg by mouth 2 (two) times daily.         Marland Kitchen levothyroxine (SYNTHROID, LEVOTHROID) 137 MCG tablet   Oral   Take 68.5 mcg by mouth daily.         Marland Kitchen losartan (COZAAR) 50 MG tablet   Oral   Take 1 tablet (50 mg total) by mouth at bedtime.         Marland Kitchen oxyCODONE (OXY IR/ROXICODONE) 5 MG immediate release tablet   Oral   Take 1 tablet (5 mg total) by mouth every 8 (eight) hours as  needed for pain. Moderate to Severe pain   30 tablet   0   . simvastatin (ZOCOR) 20 MG tablet   Oral   Take 20 mg by mouth at bedtime.           BP 125/78  Pulse 84  Temp(Src) 98.8 F (37.1 C) (Oral)  Resp 11  SpO2 98%  Physical Exam  Constitutional: She appears well-developed and well-nourished.  HENT:  Head: Normocephalic and atraumatic.  Mouth/Throat: Oropharynx is clear and moist.  Eyes: EOM are normal. Pupils are equal, round, and reactive to light.  Neck: Neck supple.  Cardiovascular: Normal rate, regular rhythm and intact distal pulses.   Pulmonary/Chest: Effort normal and breath sounds normal. No respiratory distress.  Abdominal: Soft. She exhibits no distension. There is no tenderness.  Musculoskeletal:  Obese, contractures to ext, peripheral edema UE/LE, no cords  Neurological:  Awake, alert, oriented to self/ place  Skin: Skin is warm and dry.    ED Course  Procedures (including critical care time)  1:15 AM PTs daughter requesting FA xray that was bumped in a doorway during transport. No obvious trauma on exam  CRITICAL CARE Performed by: Sunnie Nielsen Total critical care time: 30 Critical care time was exclusive of separately billable procedures and treating other patients. Critical care was necessary to treat or prevent imminent or life-threatening deterioration. Critical care was time spent personally by me on the following activities: development of treatment plan with patient and/or surrogate as well as nursing, discussions with consultants, evaluation of patient's response to treatment, examination of patient, obtaining history from patient or surrogate, ordering and performing treatments and interventions, ordering and review of laboratory studies, ordering and review of radiographic studies, pulse oximetry and re-evaluation of patient's condition. Improved with IV narcan - IV narcan drip initiated. MED consult, Dr Houston Siren to admit.   Results for  orders placed during the hospital encounter of 08/01/12  CBC      Result Value Range   WBC 5.3  4.0 - 10.5 K/uL   RBC 3.34 (*) 3.87 - 5.11 MIL/uL   Hemoglobin 10.2 (*) 12.0 - 15.0 g/dL   HCT 19.1 (*) 47.8 - 29.5 %   MCV 97.6  78.0 - 100.0 fL   MCH 30.5  26.0 - 34.0 pg   MCHC 31.3  30.0 - 36.0 g/dL   RDW 62.1  30.8 - 65.7 %   Platelets 86 (*) 150 - 400 K/uL  COMPREHENSIVE METABOLIC PANEL      Result Value Range  Sodium 142  135 - 145 mEq/L   Potassium 4.0  3.5 - 5.1 mEq/L   Chloride 103  96 - 112 mEq/L   CO2 35 (*) 19 - 32 mEq/L   Glucose, Bld 98  70 - 99 mg/dL   BUN 4 (*) 6 - 23 mg/dL   Creatinine, Ser 1.61  0.50 - 1.10 mg/dL   Calcium 8.8  8.4 - 09.6 mg/dL   Total Protein 5.9 (*) 6.0 - 8.3 g/dL   Albumin 2.5 (*) 3.5 - 5.2 g/dL   AST 13  0 - 37 U/L   ALT 5  0 - 35 U/L   Alkaline Phosphatase 33 (*) 39 - 117 U/L   Total Bilirubin 0.2 (*) 0.3 - 1.2 mg/dL   GFR calc non Af Amer >90  >90 mL/min   GFR calc Af Amer >90  >90 mL/min  POCT I-STAT, CHEM 8      Result Value Range   Sodium 144  135 - 145 mEq/L   Potassium 4.1  3.5 - 5.1 mEq/L   Chloride 102  96 - 112 mEq/L   BUN <3 (*) 6 - 23 mg/dL   Creatinine, Ser 0.45  0.50 - 1.10 mg/dL   Glucose, Bld 91  70 - 99 mg/dL   Calcium, Ion 4.09  8.11 - 1.23 mmol/L   TCO2 36  0 - 100 mmol/L   Hemoglobin 10.9 (*) 12.0 - 15.0 g/dL   HCT 91.4 (*) 78.2 - 95.6 %   Dg Forearm Left  08/01/2012   *RADIOLOGY REPORT*  Clinical Data: Left forearm pain; difficulty moving left arm.  LEFT FOREARM - 2 VIEW  Comparison: None.  Findings: There is no evidence of fracture or dislocation.  The radius and ulna appear intact.  The elbow joint is incompletely assessed, but appears grossly unremarkable.  There appears to be flattening of the carpal rows, though this is not well characterized due to limitations in positioning.  There is some degree of overlap on the lateral view, due to degenerative change along the radial aspect of the carpal rows.  IMPRESSION:  No evidence of fracture or dislocation.  The carpal rows are not well assessed, with degenerative change noted along the radial aspect of the carpal rows.   Original Report Authenticated By: Tonia Ghent, M.D.   Dg Chest Port 1 View  07/27/2012   *RADIOLOGY REPORT*  Clinical Data: Drug overdose.  PORTABLE CHEST - 1 VIEW  Comparison: Chest x-ray 04/23/2010.  Findings: Lung volumes are exceedingly low, which limits the diagnostic sensitivity and specificity of this examination.  With these limitations in mind, there are some linear bibasilar opacities which are favored to represent subsegmental atelectasis. No definite acute consolidative airspace disease.  No definite pleural effusions.  No evidence of pulmonary edema.  Heart size appears borderline to mildly enlarged.  Mediastinal contours are distorted by patient positioning.  IMPRESSION: 1.  Low lung volumes with probable bibasilar subsegmental atelectasis.   Original Report Authenticated By: Trudie Reed, M.D.   3:17 AM d/w PCCM - DR Sharol Harness recommends triad admit - can admit to ICU. PT remains awake and alert.  MDM  AMS possible OD with morphine/ baclofen pump, improved with narcan and started on narcan drip.   Labs and prior records reviewed. CXR as above. Serial exams no resp failure in ED.   ICU admit        Sunnie Nielsen, MD 08/01/12 [phone]

## 2012-08-01 NOTE — Procedures (Signed)
Central Venous Catheter Insertion Procedure Note [name] [id number] [birth year]  Procedure: Insertion of Central Venous Catheter Indications: Assessment of intravascular volume, Drug and/or fluid administration and Frequent blood sampling  Procedure Details Consent: Risks of procedure as well as the alternatives and risks of each were explained to the (patient/caregiver).  Consent for procedure obtained. Time Out: Verified patient identification, verified procedure, site/side was marked, verified correct patient position, special equipment/implants available, medications/allergies/relevent history reviewed, required imaging and test results available.  Performed  Maximum sterile technique was used including antiseptics, cap, gloves, gown, hand hygiene, mask and sheet. Skin prep: Chlorhexidine; local anesthetic administered A antimicrobial bonded/coated triple lumen catheter was placed in the left internal jugular vein using the Seldinger technique. Ultrasound guidance used.yes Catheter placed to 20 cm. Blood aspirated via all 3 ports and then flushed x 3. Line sutured x 2 and dressing applied.  Evaluation Blood flow good Complications: No apparent complications Patient did tolerate procedure well. Chest X-ray ordered to verify placement.  CXR: pending.  Brett Canales Minor ACNP Adolph Pollack PCCM Pager [phone] till 3 pm If no answer page [phone] 08/01/2012, 8:28 AM   I was present for this procedure  [name] WrightMD

## 2012-08-02 ENCOUNTER — Inpatient Hospital Stay (HOSPITAL_COMMUNITY): Payer: PRIVATE HEALTH INSURANCE

## 2012-08-02 DIAGNOSIS — G9340 Encephalopathy, unspecified: Secondary | ICD-10-CM

## 2012-08-02 DIAGNOSIS — T6591XS Toxic effect of unspecified substance, accidental (unintentional), sequela: Secondary | ICD-10-CM

## 2012-08-02 DIAGNOSIS — I69959 Hemiplegia and hemiparesis following unspecified cerebrovascular disease affecting unspecified side: Secondary | ICD-10-CM

## 2012-08-02 LAB — CBC
HCT: 33.5 % — ABNORMAL LOW (ref 36.0–46.0)
Hemoglobin: 10.8 g/dL — ABNORMAL LOW (ref 12.0–15.0)
MCH: 29.7 pg (ref 26.0–34.0)
MCV: 92 fL (ref 78.0–100.0)
Platelets: 94 10*3/uL — ABNORMAL LOW (ref 150–400)
RBC: 3.64 MIL/uL — ABNORMAL LOW (ref 3.87–5.11)
WBC: 8 10*3/uL (ref 4.0–10.5)

## 2012-08-02 LAB — COMPREHENSIVE METABOLIC PANEL
AST: 18 U/L (ref 0–37)
Albumin: 2.7 g/dL — ABNORMAL LOW (ref 3.5–5.2)
BUN: 6 mg/dL (ref 6–23)
Chloride: 98 mEq/L (ref 96–112)
Creatinine, Ser: 0.59 mg/dL (ref 0.50–1.10)
Total Bilirubin: 0.7 mg/dL (ref 0.3–1.2)
Total Protein: 6.2 g/dL (ref 6.0–8.3)

## 2012-08-02 LAB — MAGNESIUM: Magnesium: 1.4 mg/dL — ABNORMAL LOW (ref 1.5–2.5)

## 2012-08-02 LAB — GLUCOSE, CAPILLARY: Glucose-Capillary: 93 mg/dL (ref 70–99)

## 2012-08-02 LAB — VALPROIC ACID LEVEL: Valproic Acid Lvl: 57.1 ug/mL (ref 50.0–100.0)

## 2012-08-02 MED ORDER — POTASSIUM PHOSPHATE MONOBASIC 500 MG PO TABS
500.0000 mg | ORAL_TABLET | Freq: Once | ORAL | Status: DC
  Filled 2012-08-02: qty 1

## 2012-08-02 MED ORDER — ADULT MULTIVITAMIN LIQUID CH
5.0000 mL | Freq: Every day | ORAL | Status: DC
  Administered 2012-08-02 – 2012-08-05 (×3): 5 mL
  Filled 2012-08-02 (×4): qty 5

## 2012-08-02 MED ORDER — K PHOS MONO-SOD PHOS DI & MONO 155-852-130 MG PO TABS
500.0000 mg | ORAL_TABLET | Freq: Once | ORAL | Status: AC
  Administered 2012-08-02: 500 mg via ORAL
  Filled 2012-08-02: qty 2

## 2012-08-02 MED ORDER — MAGNESIUM SULFATE 40 MG/ML IJ SOLN
2.0000 g | Freq: Once | INTRAMUSCULAR | Status: AC
  Administered 2012-08-02: 2 g via INTRAVENOUS
  Filled 2012-08-02: qty 50

## 2012-08-02 MED ORDER — PROMOTE PO LIQD
1000.0000 mL | ORAL | Status: DC
  Administered 2012-08-02 – 2012-08-03 (×2): 1000 mL
  Filled 2012-08-02 (×5): qty 1000

## 2012-08-02 MED ORDER — MIDAZOLAM HCL 2 MG/2ML IJ SOLN
2.0000 mg | INTRAMUSCULAR | Status: DC | PRN
  Administered 2012-08-02 – 2012-08-03 (×5): 2 mg via INTRAVENOUS
  Filled 2012-08-02 (×5): qty 2

## 2012-08-02 MED ORDER — FENTANYL CITRATE 0.05 MG/ML IJ SOLN
50.0000 ug | INTRAMUSCULAR | Status: DC | PRN
  Administered 2012-08-02 – 2012-08-04 (×10): 100 ug via INTRAVENOUS
  Filled 2012-08-02 (×10): qty 2

## 2012-08-02 NOTE — Progress Notes (Signed)
INITIAL NUTRITION ASSESSMENT  DOCUMENTATION CODES Per approved criteria  -Morbid Obesity   INTERVENTION:  Initiate Promote formula 25 ml/hr and increase by 10 ml in 4 hours to goal rate of 35 ml/hr with Prostat liquid protein 30 ml 4 times daily to provide 1240 kcals (65% of estimated kcal needs), 112 gm protein (100% of estimated protein needs), 705 ml of free water  Liquid MVI daily via tube RD to follow for nutrition care plan  NUTRITION DIAGNOSIS: Inadequate oral intake related to inability to eat as evidenced by NPO status  Goal: EN to provide 60-70% of estimated calorie needs (22-25 kcals/kg ideal body weight) and 100% of estimated protein needs, based on ASPEN guidelines for permissive underfeeding in critically ill obese individuals  Monitor:  EN regimen & tolerance, respiratory status, weight, labs, I/O's  Reason for Assessment: Consult ---> EN initiation & management  57 y.o. female  Admitting Dx: Acute respiratory failure  ASSESSMENT: Patient with PMH including history of stroke, seizure disorder, and chronic pain/spacity requiring baclofen and narcotic pump brought to the ED with unresponsiveness; after arrival to ICU patient developed seizures.  S/p EEG 5/26 ---> abnormal findings consistent with mild encephalopathy.  Patient is currently intubated on ventilator support ---> OGT in place MV: 8.3 Temp: 37.8  Height: Ht Readings from Last 1 Encounters:  08/01/12 5\' 2"  (1.575 m)    Weight: Wt Readings from Last 1 Encounters:  08/01/12 242 lb 4.6 oz (109.9 kg)    Ideal Body Weight: 110 lb  % Ideal Body Weight: 220%  Wt Readings from Last 10 Encounters:  08/01/12 242 lb 4.6 oz (109.9 kg)  07/29/12 242 lb 1 oz (109.8 kg)  03/05/11 235 lb (106.595 kg)    Usual Body Weight: 235 lb  % Usual Body Weight: 103%  BMI:  Body mass index is 44.3 kg/(m^2).  Estimated Nutritional Needs: Kcal: 1900 Protein: 100-115 gm Fluid: per MD  Skin: Intact  Diet  Order: NPO  EDUCATION NEEDS: -No education needs identified at this time   Intake/Output Summary (Last 24 hours) at 08/02/12 0934 Last data filed at 08/02/12 0900  Gross per 24 hour  Intake 2593.33 ml  Output   2235 ml  Net 358.33 ml    Labs:   Recent Labs Lab 07/28/12 0830 07/29/12 0452 08/01/12 0045 08/01/12 0049 08/02/12 0530  NA  --  143 142 144 140  K  --  4.1 4.0 4.1 3.5  CL  --  104 103 102 98  CO2  --  32 35*  --  29  BUN  --  11 4* <3* 6  CREATININE  --  0.56 0.52 0.70 0.59  CALCIUM  --  8.8 8.8  --  8.9  MG 1.8  --   --   --  1.4*  PHOS  --   --   --   --  2.2*  GLUCOSE  --  82 98 91 81    CBG (last 3)   Recent Labs  08/01/12 2351 08/02/12 0448 08/02/12 0802  GLUCAP 87 85 76    Scheduled Meds: . amLODipine  5 mg Per Tube Daily  . antiseptic oral rinse  15 mL Mouth Rinse QID  . chlorhexidine  15 mL Mouth Rinse BID  . cloNIDine  0.1 mg Transdermal Weekly  . insulin aspart  0-20 Units Subcutaneous Q4H  . labetalol  200 mg Per Tube Q12H  . levETIRAcetam  1,000 mg Intravenous Q12H  . levothyroxine  68.5 mcg  Per Tube QAC breakfast  . losartan  50 mg Per Tube Daily  . olopatadine  1 drop Both Eyes BID  . pantoprazole sodium  40 mg Per Tube Q24H  . piperacillin-tazobactam (ZOSYN)  IV  3.375 g Intravenous Q8H  . polyethylene glycol  17 g Oral Daily  . simvastatin  20 mg Per Tube q1800  . sodium chloride  3 mL Intravenous Q12H  . valproate sodium  500 mg Intravenous Q12H  . vancomycin  1,000 mg Intravenous Q12H    Continuous Infusions: . sodium chloride 50 mL/hr at 08/02/12 0900  . fentaNYL infusion INTRAVENOUS Stopped (08/02/12 0730)  . propofol Stopped (08/02/12 0730)    Past Medical History  Diagnosis Date  . Hypertension   . Seizure disorder   . Metabolic encephalopathy 05/06/2010  . Respiratory failure 05/06/10  . UTI (lower urinary tract infection) 05/06/10  . 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 Sleep apnea 04/2010    on CPAP, "severe  central sleep apnea"  . 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 Chronic pain   . Depression   . 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 Spasticity     chronic  . Diabetes mellitus without complication   . Thrombocytopenia, unspecified 07/28/2012  . Acute respiratory failure 07/27/2012  . Narcotic overdose 07/27/2012  . 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 Unspecified hypothyroidism     Past Surgical History  Procedure Laterality Date  . Intrathecal pump implantation  2010    [employer]:  fentanyl, baclofen changed to morphine and baclofen on 07/2012  . Cerebral aneurysm repair  [address] RD, LDN Pager #: [phone] After-Hours Pager #: [phone]

## 2012-08-02 NOTE — Progress Notes (Signed)
[facility] bacteremia 1/2 from culture obtained from the central line; low grade fever 100.0 and minimal leukocytosis;   H/O both MRSA and Enterococcus;   Currently on Vanc and Zosyn; Not requiring pressors, hemodynamically stable ;   Plan: cont current antibiotics; if hemodynamically unstable consider changing Vanc to Daptomycin for possible VRE bacteremia or MRSA intermediate to vanc. Otherwise antibotics to be changed according to final culture and sensitivity;   Repeat BC; if persistently pos remove central line and consider TEE. TTE with bubble ordered.   CT head with no evidence of acute strokes but evidence of old strokes. No recent 2 D echo with or without shunt on file.

## 2012-08-02 NOTE — Progress Notes (Signed)
NEURO HOSPITALIST PROGRESS NOTE   SUBJECTIVE:                                                                                                                        No further seizures noted but continues having repetitive tremor like movements right UE. EEG showed no electrographic seizures but a pattern consistent with encephalopathy. On IV keppra and IV depacon. Valproic acid level 57 today.  OBJECTIVE:                                                                                                                           Vital signs in last 24 hours: Temp:  [99.5 F (37.5 C)-101.1 F (38.4 C)] 99.9 F (37.7 C) (05/27 0900) Pulse Rate:  [72-96] 96 (05/27 0900) Resp:  [13-22] 22 (05/27 0900) BP: (103-177)/(71-103) 177/101 mmHg (05/27 0900) SpO2:  [95 %-100 %] 99 % (05/27 0900) FiO2 (%):  [40 %] 40 % (05/27 0900)  Intake/Output from previous day: 05/26 0701 - 05/27 0700 In: 2535.8 [P.O.:100; I.V.:1255.8; IV Piggyback:1180] Out: 2155 [Urine:2155] Intake/Output this shift: Total I/O In: 107.6 [I.V.:107.6] Out: 80 [Urine:80] Nutritional status: NPO  Past Medical History  Diagnosis Date  . Hypertension   . Seizure disorder   . Metabolic encephalopathy 05/06/2010  . Respiratory failure 05/06/10  . UTI (lower urinary tract infection) 05/06/10  . 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 Sleep apnea 04/2010    on CPAP, "severe central sleep apnea"  . 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 Chronic pain   . Depression   . 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 Spasticity     chronic  . Diabetes mellitus without complication   . Thrombocytopenia, unspecified 07/28/2012  . Acute respiratory failure 07/27/2012  . Narcotic overdose 07/27/2012  . 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 Unspecified hypothyroidism      Neurologic Exam:  Mental status: she open eyes on verbal  commands and is able to follow simple commands.  CN 212: pupils 3-4 mm, reactive to light. No gaze preference. EOM full with multidirectional nystagmoid like movements. Blinks to threat. Face is symmetric. Tongue: intubated.  Motor:  Dense left hemiplegia residual from prior stroke.  Sensory: withdraws to pain.  DTR's: hyperreflexia left side. Couldn't elicit in the right.  Plantars: left upgoing, right downgoing.  Coordination and gait: unable to test.  No meningeal irritation signs.   Lab Results: Lab Results  Component Value Date/Time   CHOL  Value: 289        ATP III CLASSIFICATION:  <200     mg/dL   Desirable  098-119  mg/dL   Borderline High  >=147    mg/dL   High       * 11/05/5619  6:42 AM   Lipid Panel No results found for this basename: CHOL, TRIG, HDL, CHOLHDL, VLDL, LDLCALC,  in the last 72 hours  Studies/Results: Dg Forearm Left  08/01/2012   *RADIOLOGY REPORT*  Clinical Data: Left forearm pain; difficulty moving left arm.  LEFT FOREARM - 2 VIEW  Comparison: None.  Findings: There is no evidence of fracture or dislocation.  The radius and ulna appear intact.  The elbow joint is incompletely assessed, but appears grossly unremarkable.  There appears to be flattening of the carpal rows, though this is not well characterized due to limitations in positioning.  There is some degree of overlap on the lateral view, due to degenerative change along the radial aspect of the carpal rows.  IMPRESSION: No evidence of fracture or dislocation.  The carpal rows are not well assessed, with degenerative change noted along the radial aspect of the carpal rows.   Original Report Authenticated By: Tonia Ghent, M.D.   Ct Head Wo Contrast  08/01/2012   *RADIOLOGY REPORT*  Clinical Data: Altered mental status.  Seizure.  History of stroke.  CT HEAD WITHOUT CONTRAST  Technique:  Contiguous axial images were obtained from the base of the skull through the vertex without contrast.  Comparison:  04/24/2010 and multiple previous  Findings: There is no sign of acute infarction, mass lesion, hemorrhage, hydrocephalus or extra-axial collection.  There has been previous aneurysm clipping in the region of the right ICA. There is an old infarction in the right external capsule and corona radiata out region which has progressed atrophy encephalomalacia. There is an old thalamic infarction on the right.  There is chronic low density in the left frontal lobe with a focal calcification, unchanged.  Chronic small vessel changes are present elsewhere throughout the brain.  IMPRESSION: No acute finding.  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  Old infarctions in the right basal ganglia/external capsule and thalamus.  Old infarction in the left frontal white matter with focal calcification.   Original Report Authenticated By: Paulina Fusi, M.D.   Dg Chest Port 1 View  08/02/2012   *RADIOLOGY REPORT*  Clinical Data: Aspiration pneumonia/pneumonitis.  PORTABLE CHEST - 1 VIEW  Comparison: 08/01/2012.  Findings: Endotracheal tube has been retracted and is now 15 mm from the carina.  This is a low volume chest radiograph.  Basilar atelectasis.  Left IJ central line and enteric tube appear unchanged.  Tip of the enteric tube is in the gastric fundus proximally.  IMPRESSION:  1.  Retraction of endotracheal tube, with the tip 15 mm from the carina. 2.  Other support apparatus stable. 3.  Low volume chest.  Unchanged left greater than right basilar atelectasis and / or airspace disease.   Original Report Authenticated By: Andreas Newport, M.D.   Dg Chest Port 1 View  08/01/2012   *RADIOLOGY REPORT*  Clinical Data: Fever.  Seizure.  PORTABLE CHEST -  1 VIEW  Comparison: 07/27/2012  Findings: Artifact overlies chest.  Heart size is normal.  The right lung is clear.  There may be mild volume loss in the left lower lobe.  No effusions.  No acute bony finding.  IMPRESSION: Possible atelectasis in the left lower lobe.    Original Report Authenticated By: Paulina Fusi, M.D.   Dg Chest Port 1 View  08/01/2012   *RADIOLOGY REPORT*  Clinical Data: Endotracheal tube placement.  PORTABLE CHEST - 1 VIEW  Comparison: 08/01/2012.  Findings: The endotracheal tube is in the right mainstem bronchus and should be retracted approximately 5 cm.  The right IJ catheter tip is in the mid SVC.  The cardiac silhouette, mediastinal and hilar contours are stable.  Low lung volumes with vascular crowding and atelectasis.  Increase in left lung atelectasis likely due to the endotracheal tube position.  IMPRESSION:  1.  ET tube is in the right mainstem bronchus and should be retracted 5 cm. 2.  Slight increase in left lung atelectasis.  Findings called to the patient's nurse in the Neuro ICU.   Original Report Authenticated By: Rudie Meyer, M.D.   Dg Abd Portable 1v  08/01/2012   *RADIOLOGY REPORT*  Clinical Data: Orogastric tube placement.  PORTABLE ABDOMEN - 1 VIEW  Comparison: 03/12/2010.  Findings: The orogastric tube tip is in the body region of the stomach.  The endotracheal tube has been pulled back and the tip is 2 cm above the carina.  IMPRESSION: The endotracheal tube is 2 cm above the carina. The NG tube tip is in the body region of the stomach.   Original Report Authenticated By: Rudie Meyer, M.D.    MEDICATIONS                                                                                                                       I have reviewed the patient's current medications.  ASSESSMENT/PLAN:                                                                                                           Encephalopathy.  No electrographic seizures on EEG yesterday. Patient's daughter report that the movements right UE are not new and occur very frequently for unknown reasons. Continue keppra and depacon. Will follow up.  Wyatt Portela ,MD [facility] [phone]  08/02/2012, 9:59 AM

## 2012-08-02 NOTE — Progress Notes (Signed)
PULMONARY  / CRITICAL CARE MEDICINE  Name: [name] MRN: [id number] DOB: April 07, 1955    ADMISSION DATE:  08/01/2012 CONSULTATION DATE:  08/01/12  REFERRING MD :  Houston Siren  CHIEF COMPLAINT:  AMS and siezure  BRIEF PATIENT DESCRIPTION:  57 yo female transferred from SNF with altered mental status after recent hospital stay at [facility].  Initial concern was for accidental opiate overdose.  Developed seizure with VDRF shortly after admission, and PCCM assumed care on transfer to ICU 5/26.    PMHx of Hemorrhagic CVA with Lt sided weakness, chronic pain with indwelling morphine pain pump, HTN, Seizure disorder, OSA, Depression, DM, Hypothyroidism  SIGNIFICANT EVENTS: 5/26 Admit, given narcan for possible opiate OD, seizure, VDRF, neurology consulted  STUDIES:  5/26 CT head >> No acute finding. 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 Old infarctions in the right basal ganglia/external capsule and thalamus. Old infarction in the left frontal white matter with focal calcification. 5/26 EEG >> encephalopathy, but no seizure activity to correlate with the R arm jerking movements  LINES / TUBES: ETT 5/26 >> Lt IJ CVL 5/26 >>   CULTURES: 5/26 Blood >> 5/26 Urine >> 5/26 Sputum >>   ANTIBIOTICS: 5-26 vancomycin>> 5-26 zoysn>>  SUBJECTIVE:  Intermittently having R arm tremor   VITAL SIGNS: Temp:  [99.5 F (37.5 C)-100.6 F (38.1 C)] 99.9 F (37.7 C) (05/27 0900) Pulse Rate:  [72-96] 96 (05/27 0900) Resp:  [13-22] 22 (05/27 0900) BP: (103-177)/(71-103) 177/101 mmHg (05/27 0900) SpO2:  [95 %-100 %] 99 % (05/27 0900) FiO2 (%):  [40 %] 40 % (05/27 0900) VENTILATOR: Vent Mode:  [-] PRVC FiO2 (%):  [40 %] 40 % Set Rate:  [14 bmp] 14 bmp Vt Set:  [500 mL] 500 mL PEEP:  [5 cmH20] 5 cmH20 Plateau Pressure:  [20 cmH20-22 cmH20] 22 cmH20 INTAKE / OUTPUT: Intake/Output     05/26 0701 - 05/27 0700 05/27 0701 - 05/28 0700   P.O. 100    I.V. (mL/kg) 1255.8 (11.4) 107.6 (1)    IV Piggyback 1180    Total Intake(mL/kg) 2535.8 (23.1) 107.6 (1)   Urine (mL/kg/hr) 2155 (0.8) 80 (0.2)   Total Output 2155 80   Net +380.8 +27.6        Stool Occurrence 3 x      PHYSICAL EXAMINATION: General: No distress Neuro: Intermittent Rt side seizure activity, not moving Lt side, tried to open eyes and mouth to command 5/27 HEENT: Pinpoint pupils weakly reactive Cardiovascular: regular Lungs: scattered rhonchi Abdomen: soft, nontender, pain pump in LLQ Musculoskeletal: no edema Skin: no rashes  LABS:  Recent Labs Lab 07/27/12 1504  07/27/12 1535  07/28/12 0559  07/28/12 0830 07/28/12 1435 07/29/12 0452 07/29/12 0600 08/01/12 0045 08/01/12 0049 08/01/12 1032 08/02/12 0530  HGB  --   < > 10.6*  --  11.1*  --   --   --  10.6*  --  10.2* 10.9*  --  10.8*  WBC  --   < > 6.7  --  5.6  --   --   --  6.1  --  5.3  --   --  8.0  PLT  --   < > 142*  --  123*  --   --   --  124*  --  86*  --   --  94*  NA  --   < > 145  --  143  --   --   --  143  --  142 144  --  140  K  --   < > 4.6  --  4.2  --   --   --  4.1  --  4.0 4.1  --  3.5  CL  --   < > 104  --  105  --   --   --  104  --  103 102  --  98  CO2  --   < > 33*  --  32  --   --   --  32  --  35*  --   --  29  GLUCOSE  --   < > 123*  --  85  --   --   --  82  --  98 91  --  81  BUN  --   < > 22  --  16  --   --   --  11  --  4* <3*  --  6  CREATININE  --   < > 1.04  --  0.63  --   --   --  0.56  --  0.52 0.70  --  0.59  CALCIUM  --   < > 9.5  --  8.9  --   --   --  8.8  --  8.8  --   --  8.9  MG  --   --   --   --   --   --  1.8  --   --   --   --   --   --  1.4*  PHOS  --   --   --   --   --   --   --   --   --   --   --   --   --  2.2*  AST  --   --   --   --  20  --   --   --   --   --  13  --   --  18  ALT  --   --   --   --  6  --   --   --   --   --  5  --   --  6  ALKPHOS  --   --   --   --  41  --   --   --   --   --  33*  --   --  35*  BILITOT  --   --   --   --  0.2*  --   --   --   --   --  0.2*  --    --  0.7  PROT  --   --   --   --  7.0  --   --   --   --   --  5.9*  --   --  6.2  ALBUMIN  --   --   --   --  3.1*  --   --   --   --   --  2.5*  --   --  2.7*  LATICACIDVEN 1.7  --   --   --   --   --   --   --   --   --   --   --   --   --   TROPONINI  --   --  <0.30  --   --   --   --   --   --   --   --   --   --   --  PHART  --   --   --   < >  --   < >  --  7.296*  --  7.301*  --   --  7.418  --   PCO2ART  --   --   --   < >  --   < >  --  63.0*  --  65.6*  --   --  55.4*  --   PO2ART  --   --   --   < >  --   < >  --  88.4  --  81.5  --   --  256.0*  --   < > = values in this interval not displayed.  Recent Labs Lab 08/01/12 1613 08/01/12 2008 08/01/12 2351 08/02/12 0448 08/02/12 0802  GLUCAP 78 85 87 85 76    Imaging: Dg Forearm Left  08/01/2012   *RADIOLOGY REPORT*  Clinical Data: Left forearm pain; difficulty moving left arm.  LEFT FOREARM - 2 VIEW  Comparison: None.  Findings: There is no evidence of fracture or dislocation.  The radius and ulna appear intact.  The elbow joint is incompletely assessed, but appears grossly unremarkable.  There appears to be flattening of the carpal rows, though this is not well characterized due to limitations in positioning.  There is some degree of overlap on the lateral view, due to degenerative change along the radial aspect of the carpal rows.  IMPRESSION: No evidence of fracture or dislocation.  The carpal rows are not well assessed, with degenerative change noted along the radial aspect of the carpal rows.   Original Report Authenticated By: Tonia Ghent, M.D.   Ct Head Wo Contrast  08/01/2012   *RADIOLOGY REPORT*  Clinical Data: Altered mental status.  Seizure.  History of stroke.  CT HEAD WITHOUT CONTRAST  Technique:  Contiguous axial images were obtained from the base of the skull through the vertex without contrast.  Comparison: 04/24/2010 and multiple previous  Findings: There is no sign of acute infarction, mass lesion, hemorrhage,  hydrocephalus or extra-axial collection.  There has been previous aneurysm clipping in the region of the right ICA. There is an old infarction in the right external capsule and corona radiata out region which has progressed atrophy encephalomalacia. There is an old thalamic infarction on the right.  There is chronic low density in the left frontal lobe with a focal calcification, unchanged.  Chronic small vessel changes are present elsewhere throughout the brain.  IMPRESSION: No acute finding.  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  Old infarctions in the right basal ganglia/external capsule and thalamus.  Old infarction in the left frontal white matter with focal calcification.   Original Report Authenticated By: Paulina Fusi, M.D.   Dg Chest Port 1 View  08/02/2012   *RADIOLOGY REPORT*  Clinical Data: Aspiration pneumonia/pneumonitis.  PORTABLE CHEST - 1 VIEW  Comparison: 08/01/2012.  Findings: Endotracheal tube has been retracted and is now 15 mm from the carina.  This is a low volume chest radiograph.  Basilar atelectasis.  Left IJ central line and enteric tube appear unchanged.  Tip of the enteric tube is in the gastric fundus proximally.  IMPRESSION:  1.  Retraction of endotracheal tube, with the tip 15 mm from the carina. 2.  Other support apparatus stable. 3.  Low volume chest.  Unchanged left greater than right basilar atelectasis and / or airspace disease.   Original Report Authenticated By: Andreas Newport, M.D.   Dg  Chest Port 1 View  08/01/2012   *RADIOLOGY REPORT*  Clinical Data: Fever.  Seizure.  PORTABLE CHEST - 1 VIEW  Comparison: 07/27/2012  Findings: Artifact overlies chest.  Heart size is normal.  The right lung is clear.  There may be mild volume loss in the left lower lobe.  No effusions.  No acute bony finding.  IMPRESSION: Possible atelectasis in the left lower lobe.   Original Report Authenticated By: Paulina Fusi, M.D.   Dg Chest Port 1 View  08/01/2012   *RADIOLOGY  REPORT*  Clinical Data: Endotracheal tube placement.  PORTABLE CHEST - 1 VIEW  Comparison: 08/01/2012.  Findings: The endotracheal tube is in the right mainstem bronchus and should be retracted approximately 5 cm.  The right IJ catheter tip is in the mid SVC.  The cardiac silhouette, mediastinal and hilar contours are stable.  Low lung volumes with vascular crowding and atelectasis.  Increase in left lung atelectasis likely due to the endotracheal tube position.  IMPRESSION:  1.  ET tube is in the right mainstem bronchus and should be retracted 5 cm. 2.  Slight increase in left lung atelectasis.  Findings called to the patient's nurse in the Neuro ICU.   Original Report Authenticated By: Rudie Meyer, M.D.   Dg Abd Portable 1v  08/01/2012   *RADIOLOGY REPORT*  Clinical Data: Orogastric tube placement.  PORTABLE ABDOMEN - 1 VIEW  Comparison: 03/12/2010.  Findings: The orogastric tube tip is in the body region of the stomach.  The endotracheal tube has been pulled back and the tip is 2 cm above the carina.  IMPRESSION: The endotracheal tube is 2 cm above the carina. The NG tube tip is in the body region of the stomach.   Original Report Authenticated By: Rudie Meyer, M.D.     ASSESSMENT / PLAN: NEUROLOGIC A:  Seizure disorder, no clear evidence by EEG that R arm movements are active seizure Hx of hemorrhagic CVA with Lt sided hemiplegia. Chronic pain with chronic pain pump. Acute encephalopathy 2nd to accidental opiate overdose +/- seizure. P:   -diprivan, fentanyl for sedation -continue keppra, valproate per neurology -aggressively control fevers -morphine pump at minimal setting to maintain pump patency -defer decision to resume ASA to neurology -holding cymbalta  PULMONARY A: Acute respiratory failure 2nd to presumed aspiration pneumonitis and inability to protect airway. Hx of OSA. P:   -daily WUA and SBT -f/u CXR -prn BD's  CARDIOVASCULAR A: Hx of HTN, hyperlipidemia. P:   -continue catapress patch, amlodipine, labetalol, cozaar, zocor  RENAL A: No issues. P:   -monitor renal fx, urine outpt, electrolytes  GASTROINTESTINAL A:  Nutrition. P:   -tube feeds while on vent -protonix for SUP  HEMATOLOGIC A: Anemia, thrombocytopenia. P:  -f/u CBC -SCD for DVT prevention  INFECTIOUS A:  Concern for aspiration pneumonitis, likely bibasilar atx. No leukocytosis or sputum P:   -D2/x vancomycin, zosyn; low threshold to d/c abx 5/28  ENDOCRINE A:  DM type II. Hx of hypothyroidism. P:   -SSI -continue synthroid  CC time 40 minutes.  Levy Pupa, MD, PhD 08/02/2012, 10:39 AM [facility] [phone] or if no answer [phone]

## 2012-08-02 NOTE — Progress Notes (Signed)
ANTIBIOTIC CONSULT NOTE - FOLLOW UP  Pharmacy Consult for Vancomycin & Zosyn Indication: pneumonia and bactermia coverage  Allergies  Allergen Reactions  . Codeine Other (See Comments)    unknown    Patient Measurements: Height: 5\' 2"  (157.5 cm) Weight: 242 lb 4.6 oz (109.9 kg) IBW/kg (Calculated) : 50.1  Vital Signs: Temp: 100 F (37.8 C) (05/27 1500) BP: 133/84 mmHg (05/27 1500) Pulse Rate: 91 (05/27 1500) Intake/Output from previous day: 05/26 0701 - 05/27 0700 In: 2535.8 [P.O.:100; I.V.:1255.8; IV Piggyback:1180] Out: 2155 [Urine:2155] Intake/Output from this shift: Total I/O In: 662.6 [I.V.:307.6; IV Piggyback:355] Out: 615 [Urine:615]  Labs:  Recent Labs  08/01/12 0045 08/01/12 0049 08/02/12 0530  WBC 5.3  --  8.0  HGB 10.2* 10.9* 10.8*  PLT 86*  --  94*  CREATININE 0.52 0.70 0.59   Estimated Creatinine Clearance: 91.7 ml/min (by C-G formula based on Cr of 0.59).  Microbiology: Recent Results (from the past 720 hour(s))  MRSA PCR SCREENING     Status: None   Collection Time    07/27/12  8:32 PM      Result Value Range Status   MRSA by PCR NEGATIVE  NEGATIVE Final   Comment:            The GeneXpert MRSA Assay (FDA     approved for NASAL specimens     only), is one component of a     comprehensive MRSA colonization     surveillance program. It is not     intended to diagnose MRSA     infection nor to guide or     monitor treatment for     MRSA infections.  CULTURE, BLOOD (ROUTINE X 2)     Status: None   Collection Time    08/01/12  7:25 AM      Result Value Range Status   Specimen Description BLOOD LEFT HAND   Final   Special Requests BOTTLES DRAWN AEROBIC ONLY 5CC   Final   Culture  Setup Time 08/01/2012 12:33   Final   Culture     Final   Value:        BLOOD CULTURE RECEIVED NO GROWTH TO DATE CULTURE WILL BE HELD FOR 5 DAYS BEFORE ISSUING A FINAL NEGATIVE REPORT   Report Status PENDING   Incomplete  CULTURE, BLOOD (ROUTINE X 2)     Status:  None   Collection Time    08/01/12  8:15 AM      Result Value Range Status   Specimen Description BLOOD CENTRAL LINE   Final   Special Requests BOTTLES DRAWN AEROBIC AND ANAEROBIC LEFT IJ 5CC   Final   Culture  Setup Time 08/01/2012 20:31   Final   Culture     Final   Value: GRAM POSITIVE COCCI IN CLUSTERS     Note: Gram Stain Report Called to,Read Back By and Verified With: HEATHER SATTERFIELD 08/02/12 1352 BY SMITHERSJ   Report Status PENDING   Incomplete  CULTURE, RESPIRATORY (NON-EXPECTORATED)     Status: None   Collection Time    08/01/12  1:01 PM      Result Value Range Status   Specimen Description TRACHEAL ASPIRATE   Final   Special Requests NONE   Final   Gram Stain     Final   Value: NO WBC SEEN     NO SQUAMOUS EPITHELIAL CELLS SEEN     RARE GRAM POSITIVE COCCI     IN PAIRS  Culture Culture reincubated for better growth   Final   Report Status PENDING   Incomplete   Assessment:  Day # 2 Vancomycin and Zosyn for pneumonia coverage. Tmax 100.2, WBC 8.0.  1 of 2 blood cultures now growing gram + cocci in clusters.  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  Currently on Vancomycin 1 gram IV q12hrs after 2.5 gram IV loading dose.  Zosyn 3.375 grams IV q8hrs (each over 4 hours).   Baclofen and Morphine pain pump adjusted by [employer] rep on 5/26. Now at minimal rates to maintain patency. ~94% decrease. Printout in shadow chart.    Baclofen reduced from 199.8 mcg/day --> 12.2 mcg/day  Morphine reduced from 2.818 mg/day --> 0.172 mg/day   Goal of Therapy:  Vancomycin trough level 15-20 mcg/ml Appropriate Zosyn dose for renal function and infection  Plan:   Continue Vancomycin 1 gram IV q12hrs and Zosyn 3.375 grams IV q8hrs (each infused over 4 hours).  Will check Vancomycin trough prior to 10 am dose on 5/28.  Follow up culture data, renal function and clinical status.  Dennie Fetters, [place] Pager: [phone] 08/02/2012,4:21 PM

## 2012-08-02 NOTE — Progress Notes (Signed)
UR completed 

## 2012-08-02 NOTE — Progress Notes (Signed)
CRITICAL VALUE ALERT  Critical value received:  Gram positive cocci in clusters   Date of notification:  08/02/12  Time of notification:  1352  Critical value read back:yes  Nurse who received alert:  Rulon Eisenmenger, RN  MD notified (1st page):  Dr. Delton Coombes   Time of first page: 1400  MD notified (2nd page): Dr. Frederico Hamman  Time of second page: 1305  Responding MD:  Dr. Frederico Hamman   Time MD responded:  [phone]

## 2012-08-03 ENCOUNTER — Inpatient Hospital Stay (HOSPITAL_COMMUNITY): Payer: PRIVATE HEALTH INSURANCE

## 2012-08-03 DIAGNOSIS — I519 Heart disease, unspecified: Secondary | ICD-10-CM

## 2012-08-03 DIAGNOSIS — G4730 Sleep apnea, unspecified: Secondary | ICD-10-CM

## 2012-08-03 LAB — BASIC METABOLIC PANEL
BUN: 7 mg/dL (ref 6–23)
CO2: 29 mEq/L (ref 19–32)
Calcium: 8.8 mg/dL (ref 8.4–10.5)
Chloride: 93 mEq/L — ABNORMAL LOW (ref 96–112)
Chloride: 97 mEq/L (ref 96–112)
Glucose, Bld: 107 mg/dL — ABNORMAL HIGH (ref 70–99)
Potassium: 2.7 mEq/L — CL (ref 3.5–5.1)
Potassium: 3.2 mEq/L — ABNORMAL LOW (ref 3.5–5.1)
Sodium: 138 mEq/L (ref 135–145)

## 2012-08-03 LAB — CBC
HCT: 27.8 % — ABNORMAL LOW (ref 36.0–46.0)
MCV: 89.7 fL (ref 78.0–100.0)
Platelets: 80 10*3/uL — ABNORMAL LOW (ref 150–400)
RBC: 3.1 MIL/uL — ABNORMAL LOW (ref 3.87–5.11)
WBC: 6.2 10*3/uL (ref 4.0–10.5)

## 2012-08-03 LAB — PHOSPHORUS: Phosphorus: 3.2 mg/dL (ref 2.3–4.6)

## 2012-08-03 LAB — GLUCOSE, CAPILLARY
Glucose-Capillary: 95 mg/dL (ref 70–99)
Glucose-Capillary: 123 mg/dL — ABNORMAL HIGH (ref 70–99)
Glucose-Capillary: 86 mg/dL (ref 70–99)

## 2012-08-03 LAB — CULTURE, BLOOD (ROUTINE X 2)

## 2012-08-03 LAB — URINE CULTURE

## 2012-08-03 LAB — CULTURE, RESPIRATORY W GRAM STAIN

## 2012-08-03 MED ORDER — PRO-STAT SUGAR FREE PO LIQD
60.0000 mL | Freq: Two times a day (BID) | ORAL | Status: DC
  Administered 2012-08-03 (×2): 60 mL
  Filled 2012-08-03 (×6): qty 60

## 2012-08-03 MED ORDER — CIPROFLOXACIN IN D5W 400 MG/200ML IV SOLN
400.0000 mg | Freq: Two times a day (BID) | INTRAVENOUS | Status: DC
  Administered 2012-08-03 (×2): 400 mg via INTRAVENOUS
  Filled 2012-08-03 (×3): qty 200

## 2012-08-03 MED ORDER — SODIUM CHLORIDE 0.9 % IV SOLN
2000.0000 mg | Freq: Two times a day (BID) | INTRAVENOUS | Status: DC
  Administered 2012-08-03 – 2012-08-06 (×6): 2000 mg via INTRAVENOUS
  Filled 2012-08-03 (×7): qty 20

## 2012-08-03 MED ORDER — POTASSIUM CHLORIDE 20 MEQ/15ML (10%) PO LIQD
40.0000 meq | ORAL | Status: AC
  Administered 2012-08-03 (×2): 40 meq
  Filled 2012-08-03 (×2): qty 30

## 2012-08-03 NOTE — Progress Notes (Signed)
[facility] ADULT ICU REPLACEMENT PROTOCOL FOR AM LAB REPLACEMENT ONLY  The patient does not apply for the [facility] Adult ICU Electrolyte Replacment Protocol based on the criteria listed below:    Is urine output >/= 0.5 ml/kg/hr for the last 6 hours? no Patient's UOP is 0.3 ml/kg/hr Abnormal electrolyte(s)K2.7 If a panic level lab has been reported, has the CCM MD in charge been notified? yes.   Physician:  Dr Darrick Penna  Baltazar Apo, Charlynn Grimes 08/03/2012 6:31 AM

## 2012-08-03 NOTE — Progress Notes (Signed)
Respiratory therapy note-Placed on wean by Dr. Delton Coombes.

## 2012-08-03 NOTE — Progress Notes (Addendum)
CRITICAL VALUE ALERT  Critical value received: Potassium 2.7  Date of notification:  07/24/12  Time of notification:  0618  Critical value read back:yes  Nurse who received alert:  K.Sheriff Rodenberg  MD notified (1st page):  Deterding  Time of first page:  0618  MD notified (2nd page):  Time of second page:  Responding MD: Deterding  Time MD responded:  [phone]

## 2012-08-03 NOTE — Progress Notes (Signed)
ANTIBIOTIC CONSULT NOTE - FOLLOW UP  Pharmacy Consult for Vancomycin Indication: pneumonia and bactermia coverage  Allergies  Allergen Reactions  . Codeine Other (See Comments)    unknown    Patient Measurements: Height: 5\' 2"  (157.5 cm) Weight: 242 lb 4.6 oz (109.9 kg) IBW/kg (Calculated) : 50.1  Vital Signs: Temp: 99.9 F (37.7 C) (05/28 1000) Temp src: Other (Comment) (05/28 1000) BP: 148/95 mmHg (05/28 1000) Pulse Rate: 93 (05/28 1000) Intake/Output from previous day: 05/27 0701 - 05/28 0700 In: 2612.6 [P.O.:30; I.V.:1107.6; NG/GT:545; IV Piggyback:930] Out: 1685 [Urine:1685] Intake/Output from this shift: Total I/O In: 410 [I.V.:100; Other:150; NG/GT:105; IV Piggyback:55] Out: 315 [Urine:315]  Labs:  Recent Labs  08/01/12 0045 08/01/12 0049 08/02/12 0530 08/03/12 0515  WBC 5.3  --  8.0 6.2  HGB 10.2* 10.9* 10.8* 9.3*  PLT 86*  --  94* 80*  CREATININE 0.52 0.70 0.59 0.58   Estimated Creatinine Clearance: 91.7 ml/min (by C-G formula based on Cr of 0.58).  Microbiology: Recent Results (from the past 720 hour(s))  MRSA PCR SCREENING     Status: None   Collection Time    07/27/12  8:32 PM      Result Value Range Status   MRSA by PCR NEGATIVE  NEGATIVE Final   Comment:            The GeneXpert MRSA Assay (FDA     approved for NASAL specimens     only), is one component of a     comprehensive MRSA colonization     surveillance program. It is not     intended to diagnose MRSA     infection nor to guide or     monitor treatment for     MRSA infections.  CULTURE, BLOOD (ROUTINE X 2)     Status: None   Collection Time    08/01/12  7:25 AM      Result Value Range Status   Specimen Description BLOOD LEFT HAND   Final   Special Requests BOTTLES DRAWN AEROBIC ONLY 5CC   Final   Culture  Setup Time 08/01/2012 12:33   Final   Culture     Final   Value:        BLOOD CULTURE RECEIVED NO GROWTH TO DATE CULTURE WILL BE HELD FOR 5 DAYS BEFORE ISSUING A FINAL  NEGATIVE REPORT   Report Status PENDING   Incomplete  CULTURE, BLOOD (ROUTINE X 2)     Status: None   Collection Time    08/01/12  8:15 AM      Result Value Range Status   Specimen Description BLOOD CENTRAL LINE   Final   Special Requests BOTTLES DRAWN AEROBIC AND ANAEROBIC LEFT IJ 5CC   Final   Culture  Setup Time 08/01/2012 20:31   Final   Culture     Final   Value: STAPHYLOCOCCUS SPECIES (COAGULASE NEGATIVE)     Note: THE SIGNIFICANCE OF ISOLATING THIS ORGANISM FROM A SINGLE SET OF BLOOD CULTURES WHEN MULTIPLE SETS ARE DRAWN IS UNCERTAIN. PLEASE NOTIFY THE MICROBIOLOGY DEPARTMENT WITHIN ONE WEEK IF SPECIATION AND SENSITIVITIES ARE REQUIRED.     Note: Gram Stain Report Called to,Read Back By and Verified With: HEATHER SATTERFIELD 08/02/12 1352 BY SMITHERSJ   Report Status 08/03/2012 FINAL   Final  URINE CULTURE     Status: None   Collection Time    08/01/12 10:00 AM      Result Value Range Status   Specimen Description URINE, CATHETERIZED  Final   Special Requests NONE   Final   Culture  Setup Time 08/01/2012 20:25   Final   Colony Count >=100,000 COLONIES/ML   Final   Culture ESCHERICHIA COLI   Final   Report Status PENDING   Incomplete  CULTURE, RESPIRATORY (NON-EXPECTORATED)     Status: None   Collection Time    08/01/12  1:01 PM      Result Value Range Status   Specimen Description TRACHEAL ASPIRATE   Final   Special Requests NONE   Final   Gram Stain     Final   Value: NO WBC SEEN     NO SQUAMOUS EPITHELIAL CELLS SEEN     RARE GRAM POSITIVE COCCI     IN PAIRS   Culture Non-Pathogenic Oropharyngeal-type Flora Isolated.   Final   Report Status 08/03/2012 FINAL   Final  CULTURE, BLOOD (ROUTINE X 2)     Status: None   Collection Time    08/02/12  5:05 PM      Result Value Range Status   Specimen Description BLOOD LEFT ARM   Final   Special Requests BOTTLES DRAWN AEROBIC ONLY 2CC   Final   Culture  Setup Time 08/02/2012 23:47   Final   Culture     Final   Value:         BLOOD CULTURE RECEIVED NO GROWTH TO DATE CULTURE WILL BE HELD FOR 5 DAYS BEFORE ISSUING A FINAL NEGATIVE REPORT   Report Status PENDING   Incomplete  CULTURE, BLOOD (ROUTINE X 2)     Status: None   Collection Time    08/02/12  6:30 PM      Result Value Range Status   Specimen Description BLOOD RIGHT HAND   Final   Special Requests BOTTLES DRAWN AEROBIC AND ANAEROBIC 5CC   Final   Culture  Setup Time 08/02/2012 23:43   Final   Culture     Final   Value:        BLOOD CULTURE RECEIVED NO GROWTH TO DATE CULTURE WILL BE HELD FOR 5 DAYS BEFORE ISSUING A FINAL NEGATIVE REPORT   Report Status PENDING   Incomplete   Assessment:  Day # 3 Vancomycin and Zosyn for pneumonia coverage and possible bacteremia. 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 1 of 4 blood cultures with gram + cocci in clusters, but now identified as Coag negative Staph. No growth to date from 3 other blood cultures. Tmax 100, WBC 6.2. Vancomycin trough level is 17.5 mcg/ml, on target.    E coli in 5/26 urine culture, sensitivities pending.  Zosyn changed to Cipro IV.   Goal of Therapy:  Vancomycin trough level 15-20 mcg/ml  Plan:   Continue Vancomycin 1 gram IV q12hrs.  Follow up culture data, renal function and clinical status.  Dennie Fetters, [place] Pager: [phone] 08/03/2012,11:05 AM

## 2012-08-03 NOTE — Progress Notes (Signed)
Subjective: PAtient nods in agreement ot fact that right sided movemetns are longstanding. They were captured on EEG without evidence of ictal activity.   Exam: Filed Vitals:   08/03/12 1000  BP: 148/95  Pulse: 93  Temp: 99.9 F (37.7 C)  Resp: 25   Gen: In bed, NAD MS: Awake, Alert, follows commands ZO:XWRUE, appears to blink to threat from both driections, but not really reliable on left.  Motor: Spastic left hemiplegia, right with coarse tremor more pronounced with stimulation or movement.  Sensory:responds to LT.   Impression: 57 yo F with seizures disorder and rhythmic non-ictal movements of the right side. Unclear reason for presentation, possibly repeat opiate overdose. No clear seizure activity was seen.   Recommendations: 1)Per home meds list, was on levetiracetam 2000mg  BID prior to presentation, will increase back to this dose.  2) Continue depakote 500mg  BID, check level tomorrow.  3) depakote level tomorrow.    Ritta Slot, MD [facility] [phone]  If 7pm- 7am, please page neurology on call at [phone].

## 2012-08-03 NOTE — Progress Notes (Addendum)
PULMONARY  / CRITICAL CARE MEDICINE  Name: [name] MRN: [id number] DOB: 1955/10/18    ADMISSION DATE:  08/01/2012 CONSULTATION DATE:  08/01/12  REFERRING MD :  Houston Siren  CHIEF COMPLAINT:  AMS and siezure  BRIEF PATIENT DESCRIPTION:  57 yo female transferred from SNF with altered mental status after recent hospital stay at [facility].  Initial concern was for accidental opiate overdose.  Developed seizure with VDRF shortly after admission, and PCCM assumed care on transfer to ICU 5/26.    PMHx of Hemorrhagic CVA with Lt sided weakness, chronic pain with indwelling morphine pain pump, HTN, Seizure disorder, OSA, Depression, DM, Hypothyroidism  SIGNIFICANT EVENTS: 5/26 Admit, given narcan for possible opiate OD, seizure, VDRF, neurology consulted  STUDIES:  5/26 CT head >> No acute finding. 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 Old infarctions in the right basal ganglia/external capsule and thalamus. Old infarction in the left frontal white matter with focal calcification. 5/26 EEG >> encephalopathy, but no seizure activity to correlate with the R arm jerking movements  LINES / TUBES: ETT 5/26 >> Lt IJ CVL 5/26 >>   CULTURES: 5/27 Blood >> 5/26 Blood (from new CVC) >> 1 of 2 GPC clusters >>  5/26 Urine >> E coli >>  5/26 Sputum >>   ANTIBIOTICS: 5/26 vancomycin>> 5/26 zoysn>> 5/28 5/28 cipro (E coli) >>   SUBJECTIVE:  Still having R arm tremor  Much more awake, following commands, tolerating some PSV 14  VITAL SIGNS: Temp:  [98.8 F (37.1 C)-100 F (37.8 C)] 99.5 F (37.5 C) (05/28 0800) Pulse Rate:  [80-107] 93 (05/28 0800) Resp:  [14-22] 22 (05/28 0800) BP: (121-170)/(71-106) 144/86 mmHg (05/28 0800) SpO2:  [97 %-100 %] 100 % (05/28 0800) FiO2 (%):  [40 %] 40 % (05/28 0923) VENTILATOR: Vent Mode:  [-] PSV;CPAP FiO2 (%):  [40 %] 40 % Set Rate:  [14 bmp] 14 bmp Vt Set:  [500 mL] 500 mL PEEP:  [5 cmH20] 5 cmH20 Pressure Support:  [14 cmH20] 14  cmH20 Plateau Pressure:  [17 cmH20-21 cmH20] 19 cmH20 INTAKE / OUTPUT: Intake/Output     05/27 0701 - 05/28 0700 05/28 0701 - 05/29 0700   P.O. 30    I.V. (mL/kg) 1107.6 (10.1) 50 (0.5)   Other  50   NG/GT 545 35   IV Piggyback 930    Total Intake(mL/kg) 2612.6 (23.8) 135 (1.2)   Urine (mL/kg/hr) 1685 (0.6) 115 (0.4)   Total Output 1685 115   Net +927.6 +20        Stool Occurrence 2 x      PHYSICAL EXAMINATION: General: No distress Neuro: Intermittent Rt side tremor, not moving Lt side, able to open eyes and mouth to command 5/28 HEENT: OP clear, ETT Cardiovascular: regular Lungs: scattered rhonchi Abdomen: soft, nontender, pain pump in LLQ Musculoskeletal: no edema Skin: no rashes  LABS:  Recent Labs Lab 07/27/12 1504  07/27/12 1535  07/28/12 0559  07/28/12 0830 07/28/12 1435  07/29/12 0600 08/01/12 0045 08/01/12 0049 08/01/12 1032 08/02/12 0530 08/03/12 0515  HGB  --   < > 10.6*  --  11.1*  --   --   --   < >  --  10.2* 10.9*  --  10.8* 9.3*  WBC  --   < > 6.7  --  5.6  --   --   --   < >  --  5.3  --   --  8.0 6.2  PLT  --   < >  142*  --  123*  --   --   --   < >  --  86*  --   --  94* 80*  NA  --   < > 145  --  143  --   --   --   < >  --  142 144  --  140 136  K  --   < > 4.6  --  4.2  --   --   --   < >  --  4.0 4.1  --  3.5 2.7*  CL  --   < > 104  --  105  --   --   --   < >  --  103 102  --  98 93*  CO2  --   < > 33*  --  32  --   --   --   < >  --  35*  --   --  29 33*  GLUCOSE  --   < > 123*  --  85  --   --   --   < >  --  98 91  --  81 107*  BUN  --   < > 22  --  16  --   --   --   < >  --  4* <3*  --  6 7  CREATININE  --   < > 1.04  --  0.63  --   --   --   < >  --  0.52 0.70  --  0.59 0.58  CALCIUM  --   < > 9.5  --  8.9  --   --   --   < >  --  8.8  --   --  8.9 8.3*  MG  --   --   --   --   --   --  1.8  --   --   --   --   --   --  1.4* 1.7  PHOS  --   --   --   --   --   --   --   --   --   --   --   --   --  2.2* 3.2  AST  --   --   --   --   20  --   --   --   --   --  13  --   --  18  --   ALT  --   --   --   --  6  --   --   --   --   --  5  --   --  6  --   ALKPHOS  --   --   --   --  41  --   --   --   --   --  33*  --   --  35*  --   BILITOT  --   --   --   --  0.2*  --   --   --   --   --  0.2*  --   --  0.7  --   PROT  --   --   --   --  7.0  --   --   --   --   --  5.9*  --   --  6.2  --  ALBUMIN  --   --   --   --  3.1*  --   --   --   --   --  2.5*  --   --  2.7*  --   LATICACIDVEN 1.7  --   --   --   --   --   --   --   --   --   --   --   --   --   --   TROPONINI  --   --  <0.30  --   --   --   --   --   --   --   --   --   --   --   --   PHART  --   --   --   < >  --   < >  --  7.296*  --  7.301*  --   --  7.418  --   --   PCO2ART  --   --   --   < >  --   < >  --  63.0*  --  65.6*  --   --  55.4*  --   --   PO2ART  --   --   --   < >  --   < >  --  88.4  --  81.5  --   --  256.0*  --   --   < > = values in this interval not displayed.  Recent Labs Lab 08/02/12 1544 08/02/12 2123 08/03/12 0005 08/03/12 0414 08/03/12 0732  GLUCAP 81 93 103* 104* 95    Imaging: Ct Head Wo Contrast  08/01/2012   *RADIOLOGY REPORT*  Clinical Data: Altered mental status.  Seizure.  History of stroke.  CT HEAD WITHOUT CONTRAST  Technique:  Contiguous axial images were obtained from the base of the skull through the vertex without contrast.  Comparison: 04/24/2010 and multiple previous  Findings: There is no sign of acute infarction, mass lesion, hemorrhage, hydrocephalus or extra-axial collection.  There has been previous aneurysm clipping in the region of the right ICA. There is an old infarction in the right external capsule and corona radiata out region which has progressed atrophy encephalomalacia. There is an old thalamic infarction on the right.  There is chronic low density in the left frontal lobe with a focal calcification, unchanged.  Chronic small vessel changes are present elsewhere throughout the brain.  IMPRESSION: No acute  finding.  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  Old infarctions in the right basal ganglia/external capsule and thalamus.  Old infarction in the left frontal white matter with focal calcification.   Original Report Authenticated By: Paulina Fusi, M.D.   Dg Chest Port 1 View  08/03/2012   *RADIOLOGY REPORT*  Clinical Data: Evaluate ET tube  PORTABLE CHEST - 1 VIEW  Comparison:  08/02/2012  Findings: Endotracheal tube tip is above the carina.  There is a left IJ catheter with tip in the projection of the SVC.  Heart size appears moderately enlarged.  The lung volumes are low.  Unchanged, left greater than right, atelectasis and airspace disease.  IMPRESSION:  1.  Stable support apparatus. 2.  No change in low lung volumes and bilateral lung opacities.   Original Report Authenticated By: Signa Kell, M.D.   Dg Chest Port 1 View  08/02/2012   *RADIOLOGY REPORT*  Clinical Data: Aspiration pneumonia/pneumonitis.  PORTABLE CHEST - 1  VIEW  Comparison: 08/01/2012.  Findings: Endotracheal tube has been retracted and is now 15 mm from the carina.  This is a low volume chest radiograph.  Basilar atelectasis.  Left IJ central line and enteric tube appear unchanged.  Tip of the enteric tube is in the gastric fundus proximally.  IMPRESSION:  1.  Retraction of endotracheal tube, with the tip 15 mm from the carina. 2.  Other support apparatus stable. 3.  Low volume chest.  Unchanged left greater than right basilar atelectasis and / or airspace disease.   Original Report Authenticated By: Andreas Newport, M.D.   Dg Abd Portable 1v  08/01/2012   *RADIOLOGY REPORT*  Clinical Data: Orogastric tube placement.  PORTABLE ABDOMEN - 1 VIEW  Comparison: 03/12/2010.  Findings: The orogastric tube tip is in the body region of the stomach.  The endotracheal tube has been pulled back and the tip is 2 cm above the carina.  IMPRESSION: The endotracheal tube is 2 cm above the carina. The NG tube tip is in the body region of  the stomach.   Original Report Authenticated By: Rudie Meyer, M.D.     ASSESSMENT / PLAN: NEUROLOGIC A:  Seizure disorder, no clear evidence by EEG that R arm movements are active seizure Hx of hemorrhagic CVA with Lt sided hemiplegia. Chronic pain with chronic pain pump. Acute encephalopathy 2nd to accidental opiate overdose +/- seizure. P:   -changed to intermittent sedation 5/27 -continue keppra, valproate per neurology -aggressively control fevers -morphine pump at minimal setting to maintain pump patency -defer decision to resume ASA to neurology -holding cymbalta  PULMONARY A: Acute respiratory failure 2nd to presumed aspiration pneumonitis and inability to protect airway. Hx of OSA. P:   -daily WUA and SBT -f/u CXR -prn BD's  CARDIOVASCULAR A: Hx of HTN, hyperlipidemia. P:  -continue catapress patch, amlodipine, labetalol, cozaar, zocor  RENAL A: hypokalemia  P:   -replace K prn -monitor renal fx, urine outpt, electrolytes  GASTROINTESTINAL A:  Nutrition. P:   -tube feeds while on vent -protonix for SUP  HEMATOLOGIC A: Anemia, thrombocytopenia. P:  -f/u CBC -SCD for DVT prevention  INFECTIOUS A:  Concern for aspiration pneumonitis, likely bibasilar atx. No leukocytosis or sputum 1 of 2 GPC from 5/26 blood cx, 5/27 negative, speciation pending. Has prior hx both MRSA and VRE E coli UTI, sens pending P:   -D3/x vancomycin, zosyn > will continue vanco pending speciation GPC's, consider linezolid or dapto if she decompensates on vanco.  Change zosyn to cipro 5/28 and follow  ENDOCRINE A:  DM type II. Hx of hypothyroidism. P:   -SSI -continue synthroid  CC time 30 minutes.  Levy Pupa, MD, PhD 08/03/2012, 9:51 AM  [facility] [phone] or if no answer [phone]

## 2012-08-03 NOTE — Progress Notes (Signed)
  Echocardiogram 2D Echocardiogram has been performed.  Georgian Co 08/03/2012, 9:14 AM

## 2012-08-04 ENCOUNTER — Inpatient Hospital Stay (HOSPITAL_COMMUNITY): Payer: PRIVATE HEALTH INSURANCE

## 2012-08-04 LAB — BASIC METABOLIC PANEL
BUN: 7 mg/dL (ref 6–23)
Chloride: 96 mEq/L (ref 96–112)
Creatinine, Ser: 0.6 mg/dL (ref 0.50–1.10)
Creatinine, Ser: 0.52 mg/dL (ref 0.50–1.10)
GFR calc Af Amer: >90 mL/min (ref >90)
GFR calc Af Amer: >90 mL/min (ref >90)
GFR calc non Af Amer: >90 mL/min (ref >90)
Potassium: 3.2 mEq/L — ABNORMAL LOW (ref 3.5–5.1)
Sodium: 138 mEq/L (ref 135–145)

## 2012-08-04 LAB — CBC
MCH: 29.6 pg (ref 26.0–34.0)
MCHC: 33.2 g/dL (ref 30.0–36.0)
Platelets: 80 10*3/uL — ABNORMAL LOW (ref 150–400)
RBC: 3.11 MIL/uL — ABNORMAL LOW (ref 3.87–5.11)

## 2012-08-04 LAB — GLUCOSE, CAPILLARY
Glucose-Capillary: 102 mg/dL — ABNORMAL HIGH (ref 70–99)
Glucose-Capillary: 96 mg/dL (ref 70–99)

## 2012-08-04 LAB — MAGNESIUM: Magnesium: 1.7 mg/dL (ref 1.5–2.5)

## 2012-08-04 LAB — PHOSPHORUS: Phosphorus: 3.7 mg/dL (ref 2.3–4.6)

## 2012-08-04 LAB — VALPROIC ACID LEVEL: Valproic Acid Lvl: 68.6 ug/mL (ref 50.0–100.0)

## 2012-08-04 MED ORDER — POTASSIUM CHLORIDE 20 MEQ/15ML (10%) PO LIQD
40.0000 meq | ORAL | Status: DC
  Administered 2012-08-04: 40 meq

## 2012-08-04 MED ORDER — POTASSIUM CHLORIDE 10 MEQ/50ML IV SOLN
10.0000 meq | INTRAVENOUS | Status: AC
  Administered 2012-08-04 (×3): 10 meq via INTRAVENOUS
  Filled 2012-08-04 (×3): qty 50

## 2012-08-04 MED ORDER — POTASSIUM CHLORIDE 20 MEQ/15ML (10%) PO LIQD
40.0000 meq | ORAL | Status: DC
  Administered 2012-08-04: 40 meq
  Filled 2012-08-04 (×2): qty 30

## 2012-08-04 MED ORDER — LABETALOL HCL 5 MG/ML IV SOLN
10.0000 mg | Freq: Two times a day (BID) | INTRAVENOUS | Status: DC
  Administered 2012-08-04: 10 mg via INTRAVENOUS
  Filled 2012-08-04 (×3): qty 4

## 2012-08-04 MED ORDER — DEXTROSE 5 % IV SOLN
1.0000 g | INTRAVENOUS | Status: DC
  Administered 2012-08-04 – 2012-08-07 (×4): 1 g via INTRAVENOUS
  Filled 2012-08-04 (×5): qty 10

## 2012-08-04 MED ORDER — POTASSIUM CHLORIDE 20 MEQ/15ML (10%) PO LIQD
ORAL | Status: AC
  Filled 2012-08-04: qty 30

## 2012-08-04 NOTE — Progress Notes (Signed)
Subjective: No events overnight.   Exam: Filed Vitals:   08/04/12 0700  BP: 145/97  Pulse: 84  Temp: 99.3 F (37.4 C)  Resp: 16   Gen: In bed, intubated MS: Awake, Alert, follows commands immediately.  ZO:XWRUE, appears to have left field cut Motor: moves right side well, though with coarse tremor that is continuous with action or stimulation.  Sensory:intact to LT on right, decerased on left.    Impression: 57 yo F who presented with AMS, ? Overdose. Though a possibility of seizure is there, it is not clear to me that this was the presenting factor, and in this situation I would be hesitant to make adjustments to her seizure medications. If she were to have another event then would adjust medications.   Right side tremor is present at baseline, and EEG confirmed that it is not ictal in nature.   Recommendations: 1) Continue keppra 2000mg  BID 2) continue depakote 500mg  BID   Ritta Slot, MD [facility] [phone]  If 7pm- 7am, please page neurology on call at [phone].

## 2012-08-04 NOTE — Progress Notes (Signed)
NUTRITION FOLLOW UP  Intervention:   None at this time.  Nutrition Dx:   Inadequate oral intake related to inability to eat as evidenced by NPO status; ongoing.   Goal:   Enteral nutrition to provide 60-70% of estimated calorie needs (22-25 kcals/kg ideal body weight) and 100% of estimated protein needs, based on ASPEN guidelines for permissive underfeeding in critically ill obese individuals; NA  New Goal:  Pt to meet >/= 90% of their estimated nutrition needs.   Monitor:   Respiratory status, weight trend, labs  Assessment:   Pt with hx of chronic pain/spacity requiring pain pump brought in from SNF with unresponsiveness. Pt developed seizures.  Pt discussed during ICU rounds and with RN.  Pt started on enteral nutrition 5/27 but d/c'ed when extubated this am. Pt had to be placed on Bipap this afternoon and remains NPO at this time.  Height: Ht Readings from Last 1 Encounters:  08/01/12 5\' 2"  (1.575 m)    Weight Status:   Wt Readings from Last 1 Encounters:  08/04/12 239 lb 10.2 oz (108.7 kg)  Admission weight 242 lb 5/26  Re-estimated needs:  Kcal: 1800-2000 Protein: 90-105 grams Fluid: > 1.8 L/day  Skin: skin tear   Diet Order: NPO   Intake/Output Summary (Last 24 hours) at 08/04/12 1445 Last data filed at 08/04/12 1243  Gross per 24 hour  Intake   2895 ml  Output   4760 ml  Net  -1865 ml    Last BM: 5/29 pt with flexiseal in place, c diff pending   Labs:   Recent Labs Lab 08/02/12 0530 08/03/12 0515 08/03/12 1700 08/04/12 0540 08/04/12 1300  NA 140 136 138 138 136  K 3.5 2.7* 3.2* 2.7* 3.2*  CL 98 93* 97 96 97  CO2 29 33* 29 29 29   BUN 6 7 9 10 7   CREATININE 0.59 0.58 0.63 0.60 0.52  CALCIUM 8.9 8.3* 8.8 8.7 8.7  MG 1.4* 1.7  --  1.7  --   PHOS 2.2* 3.2  --  3.7  --   GLUCOSE 81 107* 100* 105* 94    CBG (last 3)   Recent Labs  08/03/12 0732 08/03/12 1205 08/03/12 1539  GLUCAP 95 123* 86    Scheduled Meds: . amLODipine  5 mg  Per Tube Daily  . antiseptic oral rinse  15 mL Mouth Rinse QID  . cefTRIAXone (ROCEPHIN)  IV  1 g Intravenous Q24H  . chlorhexidine  15 mL Mouth Rinse BID  . cloNIDine  0.1 mg Transdermal Weekly  . feeding supplement  60 mL Per Tube BID  . feeding supplement (PROMOTE)  1,000 mL Per Tube Q24H  . insulin aspart  0-20 Units Subcutaneous Q4H  . labetalol  200 mg Per Tube Q12H  . levETIRAcetam  2,000 mg Intravenous Q12H  . levothyroxine  68.5 mcg Per Tube QAC breakfast  . losartan  50 mg Per Tube Daily  . multivitamin  5 mL Per Tube Daily  . olopatadine  1 drop Both Eyes BID  . pantoprazole sodium  40 mg Per Tube Q24H  . polyethylene glycol  17 g Oral Daily  . potassium chloride  40 mEq Per Tube Q4H  . simvastatin  20 mg Per Tube q1800  . sodium chloride  3 mL Intravenous Q12H  . valproate sodium  500 mg Intravenous Q12H    Continuous Infusions: . sodium chloride 50 mL/hr at 08/04/12 1200    Kendell Bane RD, LDN, CNSC [phone] Pager  [phone] After Hours Pager

## 2012-08-04 NOTE — Progress Notes (Addendum)
Post extubation, pt tachypneic, but sats 99-100% on [place] 4L and pt not reporting any difficulty breathing. No stridor noted. Pt now having some stridor, and tachypneic. RT notified and albuterol treatment given. Slight improvement in work of breathing, but continues to have stridor. Per family, pt uses bipap as needed at [facility]. MD notified. Order received to put pt on Bipap and for RT to titrate as pt tolerates. RT aware.   [name]   Improvement noted in pt's RR & work of breathing.

## 2012-08-04 NOTE — Progress Notes (Signed)
eLink Physician-Brief Progress Note Patient Name: SHAWNTEL FARNWORTH DOB: [birth year] MRN: [id number]  Date of Service  08/04/2012   HPI/Events of Note     eICU Interventions  Hypokalemia -repleted    Intervention Category Intermediate Interventions: Electrolyte abnormality - evaluation and management  Caidyn Blossom V. 08/04/2012, 4:12 PM

## 2012-08-04 NOTE — Progress Notes (Signed)
eLink Physician-Brief Progress Note Patient Name: [name] DOB: [birth year] MRN: [id number]  Date of Service  08/04/2012   HPI/Events of Note  Patient with multiple stools on ABX and TFs.  C Diff results are pending. Patient does have some breakdown in the rectal area.  eICU Interventions  Plan: Order for flexiseal to prevent continued skin irritation F/U on C Diff results   Intervention Category Minor Interventions: Routine modifications to care plan (e.g. PRN medications for pain, fever)  DETERDING,ELIZABETH 08/04/2012, 3:37 AM

## 2012-08-04 NOTE — Procedures (Signed)
Extubation Procedure Note  Patient Details:   Name: [name] DOB: 09-Mar-1956 MRN: [id number]   Airway Documentation:  Airway 7.5 mm (Active)  Secured at (cm) 22 cm 08/04/2012  8:07 AM  Measured From Lips 08/04/2012  8:07 AM  Secured Location Left 08/04/2012  8:07 AM  Secured By [employer] 08/04/2012  8:07 AM  Tube Holder Repositioned Yes 08/04/2012  8:07 AM  Cuff Pressure (cm H2O) 24 cm H2O 08/04/2012  8:07 AM  Site Condition Dry 08/03/2012  4:00 PM    Evaluation  O2 sats:99%  stable throughout Complications: none No apparent complications Patient did DID  tolerate procedure well. Bilateral Breath Sounds: Rhonchi Suctioning: Airway yes Yes  Ellin Mayhew 08/04/2012, 10:08 AM

## 2012-08-04 NOTE — Progress Notes (Signed)
eLink Physician-Brief Progress Note Patient Name: [name] DOB: 1955/08/26 MRN: [id number]  Date of Service  08/04/2012   HPI/Events of Note     eICU Interventions  Change labetalol to IV while PO meds held on bipap   Intervention Category Intermediate Interventions: Medication change / dose adjustment  [name] V. 08/04/2012, 10:13 PM

## 2012-08-04 NOTE — Progress Notes (Signed)
CRITICAL VALUE ALERT  Critical value received:  Potassium 2.7  Date of notification: 08/04/2012  Time of notification:  0648  Critical value read back:yes  Nurse who received alert:  Jacqulynn Cadet   MD notified (1st page):  Dr. Darrick Penna  Time of first page: [phone]  MD notified (2nd page):  Time of second page:  Responding MD:  Dr. Darrick Penna  Time MD responded:  [phone]

## 2012-08-04 NOTE — Progress Notes (Signed)
PULMONARY  / CRITICAL CARE MEDICINE  Name: [name] MRN: [id number] DOB: 1955/10/15    ADMISSION DATE:  08/01/2012 CONSULTATION DATE:  08/01/12  REFERRING MD :  Houston Siren  CHIEF COMPLAINT:  AMS and siezure  BRIEF PATIENT DESCRIPTION:  57 yo female transferred from SNF with altered mental status after recent hospital stay at [facility].  Initial concern was for accidental opiate overdose.  Developed seizure with VDRF shortly after admission, and PCCM assumed care on transfer to ICU 5/26.    PMHx of Hemorrhagic CVA with Lt sided weakness, chronic pain with indwelling morphine pain pump, HTN, Seizure disorder, OSA, Depression, DM, Hypothyroidism  SIGNIFICANT EVENTS: 5/26 Admit, given narcan for possible opiate OD, seizure, VDRF, neurology consulted  STUDIES:  5/26 CT head >> No acute finding. 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 Old infarctions in the right basal ganglia/external capsule and thalamus. Old infarction in the left frontal white matter with focal calcification. 5/26 EEG >> encephalopathy, but no seizure activity to correlate with the R arm jerking movements  LINES / TUBES: ETT 5/26 >> Lt IJ CVL 5/26 >>   CULTURES: 5/27 Blood >> 5/26 Blood (from new CVC) >> 1 of 2 GPC clusters >>  5/26 Urine >> E coli >>  5/26 Sputum >>   ANTIBIOTICS: 5/26 vancomycin>> 5/26 zoysn>> 5/28 5/28 cipro (E coli) >>  5/29 C diff >>   SUBJECTIVE:  Still having R arm tremor  Awake, tolerated PSV yesterday No new seizure activity reported Diarrhea o/n > c diff checked  VITAL SIGNS: Temp:  [99.3 F (37.4 C)-100.2 F (37.9 C)] 99.3 F (37.4 C) (05/29 0700) Pulse Rate:  [78-102] 84 (05/29 0700) Resp:  [13-35] 16 (05/29 0700) BP: (92-150)/(57-101) 145/97 mmHg (05/29 0700) SpO2:  [98 %-100 %] 100 % (05/29 0700) FiO2 (%):  [40 %] 40 % (05/29 0700) Weight:  [108.7 kg (239 lb 10.2 oz)] 108.7 kg (239 lb 10.2 oz) (05/29 0500) VENTILATOR: Vent Mode:  [-] PRVC FiO2 (%):  [40  %] 40 % Set Rate:  [14 bmp] 14 bmp Vt Set:  [500 mL] 500 mL PEEP:  [5 cmH20] 5 cmH20 Pressure Support:  [14 cmH20] 14 cmH20 Plateau Pressure:  [19 cmH20-22 cmH20] 19 cmH20 INTAKE / OUTPUT: Intake/Output     05/28 0701 - 05/29 0700 05/29 0701 - 05/30 0700   P.O.     I.V. (mL/kg) 1100 (10.1)    Other 580    NG/GT 840    IV Piggyback 1150    Total Intake(mL/kg) 3670 (33.8)    Urine (mL/kg/hr) 4450 (1.7)    Total Output 4450     Net -780          Stool Occurrence 6 x      PHYSICAL EXAMINATION: General: No distress Neuro: Intermittent Rt side tremor, not moving Lt side, able to open eyes and mouth to command 5/28-9 HEENT: OP clear, ETT Cardiovascular: regular Lungs: scattered rhonchi Abdomen: soft, nontender, pain pump in LLQ Musculoskeletal: no edema Skin: no rashes  LABS:  Recent Labs Lab 07/28/12 1435  07/29/12 0600 08/01/12 0045  08/01/12 1032 08/02/12 0530 08/03/12 0515 08/03/12 1700 08/04/12 0540  HGB  --   < >  --  10.2*  < >  --  10.8* 9.3*  --  9.2*  WBC  --   < >  --  5.3  --   --  8.0 6.2  --  6.8  PLT  --   < >  --  86*  --   --  94* 80*  --  80*  NA  --   < >  --  142  < >  --  140 136 138 138  K  --   < >  --  4.0  < >  --  3.5 2.7* 3.2* 2.7*  CL  --   < >  --  103  < >  --  98 93* 97 96  CO2  --   < >  --  35*  --   --  29 33* 29 29  GLUCOSE  --   < >  --  98  < >  --  81 107* 100* 105*  BUN  --   < >  --  4*  < >  --  6 7 9 10   CREATININE  --   < >  --  0.52  < >  --  0.59 0.58 0.63 0.60  CALCIUM  --   < >  --  8.8  --   --  8.9 8.3* 8.8 8.7  MG  --   --   --   --   --   --  1.4* 1.7  --  1.7  PHOS  --   --   --   --   --   --  2.2* 3.2  --  3.7  AST  --   --   --  13  --   --  18  --   --   --   ALT  --   --   --  5  --   --  6  --   --   --   ALKPHOS  --   --   --  33*  --   --  35*  --   --   --   BILITOT  --   --   --  0.2*  --   --  0.7  --   --   --   PROT  --   --   --  5.9*  --   --  6.2  --   --   --   ALBUMIN  --   --   --  2.5*  --    --  2.7*  --   --   --   PHART 7.296*  --  7.301*  --   --  7.418  --   --   --   --   PCO2ART 63.0*  --  65.6*  --   --  55.4*  --   --   --   --   PO2ART 88.4  --  81.5  --   --  256.0*  --   --   --   --   < > = values in this interval not displayed.  Recent Labs Lab 08/03/12 0005 08/03/12 0414 08/03/12 0732 08/03/12 1205 08/03/12 1539  GLUCAP 103* 104* 95 123* 86    Imaging: Dg Chest Port 1 View  08/03/2012   *RADIOLOGY REPORT*  Clinical Data: Evaluate ET tube  PORTABLE CHEST - 1 VIEW  Comparison:  08/02/2012  Findings: Endotracheal tube tip is above the carina.  There is a left IJ catheter with tip in the projection of the SVC.  Heart size appears moderately enlarged.  The lung volumes are low.  Unchanged, left greater than right, atelectasis and airspace disease.  IMPRESSION:  1.  Stable support apparatus. 2.  No change in low lung volumes and bilateral lung opacities.   Original Report Authenticated By: Signa Kell, M.D.     ASSESSMENT / PLAN: NEUROLOGIC A:  Seizure disorder, no clear evidence by EEG that R arm movements are active seizure Hx of hemorrhagic CVA with Lt sided hemiplegia. Chronic pain with chronic pain pump. Acute encephalopathy 2nd to accidental opiate overdose +/- seizure. P:   -changed to intermittent sedation 5/27 -continue keppra, valproate per neurology, no evidence new seizures -aggressively control fevers (resolved) -morphine pump at minimal setting to maintain pump patency -resume ASA once extubated -holding cymbalta  PULMONARY A: Acute respiratory failure 2nd to presumed aspiration pneumonitis and inability to protect airway. Hx of OSA. P:   -daily WUA and SBT, goal extubation 5/29 -f/u CXR -prn BD's  CARDIOVASCULAR A: Hx of HTN, hyperlipidemia. P:  -continue catapress patch, amlodipine, labetalol, cozaar, zocor  RENAL A: hypokalemia, refractory P:   -replace K prn -monitor renal fx, urine outpt,  electrolytes  GASTROINTESTINAL A:  Nutrition. Diarrhea, likely due to TF P:   -tube feeds while on vent, hold for possible extubation 5/29 -protonix for SUP -c diff pending  HEMATOLOGIC A: Anemia, thrombocytopenia. P:  -f/u CBC -SCD for DVT prevention  INFECTIOUS A:  Concern for aspiration pneumonitis, likely bibasilar atx. No leukocytosis or sputum 1 of 2 coag neg staph from 5/26 blood cx, 5/27 negative, speciation pending. Has prior hx both MRSA and VRE E coli UTI, R to cipro, sens to ceftriaxone, zosyn P:   On Vanco, could consider linezolid or dapto if she decompensates on vanco > plan d/c vanco 5/29 and follow Changed zosyn to cipro 5/28 but note E coli resistant to cipro Change cipro to ceftriaxone on 5/29  ENDOCRINE A:  DM type II. Hx of hypothyroidism. P:   -SSI -continue synthroid  CC time 45 minutes.  Levy Pupa, MD, PhD 08/04/2012, 8:07 AM [facility] [phone] or if no answer [phone]

## 2012-08-05 ENCOUNTER — Inpatient Hospital Stay (HOSPITAL_COMMUNITY): Payer: PRIVATE HEALTH INSURANCE

## 2012-08-05 LAB — BASIC METABOLIC PANEL
BUN: 5 mg/dL — ABNORMAL LOW (ref 6–23)
Creatinine, Ser: 0.49 mg/dL — ABNORMAL LOW (ref 0.50–1.10)
GFR calc Af Amer: >90 mL/min (ref >90)
GFR calc non Af Amer: >90 mL/min (ref >90)
Potassium: 3.5 mEq/L (ref 3.5–5.1)

## 2012-08-05 LAB — GLUCOSE, CAPILLARY

## 2012-08-05 LAB — CBC
MCH: 30.1 pg (ref 26.0–34.0)
MCHC: 33.7 g/dL (ref 30.0–36.0)
MCV: 89.4 fL (ref 78.0–100.0)
Platelets: 105 10*3/uL — ABNORMAL LOW (ref 150–400)
RDW: 15.1 % (ref 11.5–15.5)

## 2012-08-05 MED ORDER — MAGNESIUM SULFATE 40 MG/ML IJ SOLN
2.0000 g | Freq: Once | INTRAMUSCULAR | Status: AC
  Administered 2012-08-05: 2 g via INTRAVENOUS
  Filled 2012-08-05: qty 50

## 2012-08-05 MED ORDER — SODIUM CHLORIDE 0.9 % IJ SOLN
INTRAMUSCULAR | Status: AC
  Administered 2012-08-05: 10 mL
  Filled 2012-08-05: qty 20

## 2012-08-05 MED ORDER — LABETALOL HCL 200 MG PO TABS
200.0000 mg | ORAL_TABLET | Freq: Two times a day (BID) | ORAL | Status: DC
  Administered 2012-08-05 – 2012-08-09 (×9): 200 mg via ORAL
  Filled 2012-08-05 (×11): qty 1

## 2012-08-05 MED ORDER — CHLORHEXIDINE GLUCONATE 0.12 % MT SOLN
15.0000 mL | Freq: Two times a day (BID) | OROMUCOSAL | Status: DC
  Administered 2012-08-05 – 2012-08-08 (×7): 15 mL via OROMUCOSAL
  Filled 2012-08-05 (×9): qty 15

## 2012-08-05 MED ORDER — DULOXETINE HCL 60 MG PO CPEP
60.0000 mg | ORAL_CAPSULE | Freq: Every day | ORAL | Status: DC
  Administered 2012-08-05 – 2012-08-09 (×5): 60 mg via ORAL
  Filled 2012-08-05 (×5): qty 1

## 2012-08-05 MED ORDER — ASPIRIN EC 81 MG PO TBEC
81.0000 mg | DELAYED_RELEASE_TABLET | Freq: Every day | ORAL | Status: DC
  Administered 2012-08-05 – 2012-08-09 (×5): 81 mg via ORAL
  Filled 2012-08-05 (×5): qty 1

## 2012-08-05 MED ORDER — ENSURE COMPLETE PO LIQD
237.0000 mL | Freq: Two times a day (BID) | ORAL | Status: DC
  Administered 2012-08-06 – 2012-08-08 (×4): 237 mL via ORAL

## 2012-08-05 MED ORDER — ADULT MULTIVITAMIN W/MINERALS CH
1.0000 | ORAL_TABLET | Freq: Every day | ORAL | Status: DC
  Administered 2012-08-06 – 2012-08-09 (×4): 1 via ORAL
  Filled 2012-08-05 (×4): qty 1

## 2012-08-05 NOTE — Progress Notes (Signed)
PULMONARY  / CRITICAL CARE MEDICINE  Name: JAIDY COTTAM MRN: [id number] DOB: [birth year]    ADMISSION DATE:  08/01/2012 CONSULTATION DATE:  08/01/12  REFERRING MD :  Houston Siren  CHIEF COMPLAINT:  AMS and siezure  BRIEF PATIENT DESCRIPTION:  57 yo female transferred from SNF with altered mental status after recent hospital stay at [facility].  Initial concern was for accidental opiate overdose.  Developed seizure with VDRF shortly after admission, and PCCM assumed care on transfer to ICU 5/26.    PMHx of Hemorrhagic CVA with Lt sided weakness, chronic pain with indwelling morphine pain pump, HTN, Seizure disorder, OSA, Depression, DM, Hypothyroidism  SIGNIFICANT EVENTS: 5/26 Admit, given narcan for possible opiate OD, seizure, VDRF, neurology consulted  STUDIES:  5/26 CT head >> No acute finding. 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 Old infarctions in the right basal ganglia/external capsule and thalamus. Old infarction in the left frontal white matter with focal calcification. 5/26 EEG >> encephalopathy, but no seizure activity to correlate with the R arm jerking movements  LINES / TUBES: ETT 5/26 >> 5/29 Lt IJ CVL 5/26 >>   CULTURES: 5/27 Blood >> NGTD >>  5/26 Blood (from new CVC) >> 1 of 2 coag negative staph >>  5/26 Urine >> E coli >> R to cefazolin, quinolones; S to ceftriaxone, zosyn, bactrim, gent 5/26 Sputum >> normal flora 5/29 C diff >> negative  ANTIBIOTICS: 5/26 vancomycin>> 5/26 zoysn>> 5/28 5/28 cipro (E coli) >> 5/29 5/29 ceftriaxone (Ecoli) >>    SUBJECTIVE:  Still having R arm tremor but better No new seizure activity reported Still w diarrhea, c diff negative, flexiseal in place  VITAL SIGNS: Temp:  [98.2 F (36.8 C)-99.7 F (37.6 C)] 98.2 F (36.8 C) (05/30 0700) Pulse Rate:  [67-91] 83 (05/30 0700) Resp:  [16-39] 28 (05/30 0700) BP: (126-159)/(72-111) 149/86 mmHg (05/30 0700) SpO2:  [10 %-100 %] 98 % (05/30 0700) FiO2 (%):  [40 %] 40  % (05/29 1700) VENTILATOR: Vent Mode:  [-]  FiO2 (%):  [40 %] 40 % INTAKE / OUTPUT: Intake/Output     05/29 0701 - 05/30 0700 05/30 0701 - 05/31 0700   I.V. (mL/kg) 1200 (11) 50 (0.5)   Other 120    NG/GT 0    IV Piggyback 600    Total Intake(mL/kg) 1920 (17.7) 50 (0.5)   Urine (mL/kg/hr) 5275 (2) 725 (2.3)   Total Output 5275 725   Net -3355 -675          PHYSICAL EXAMINATION: General: No distress, obese Neuro: Intermittent Rt side tremor, not moving Lt side, awake, interacting, some disorientation HEENT: OP clear Cardiovascular: regular Lungs: scattered rhonchi Abdomen: soft, nontender, pain pump in LLQ Musculoskeletal: no edema Skin: no rashes  LABS:  Recent Labs Lab 08/01/12 0045  08/01/12 1032  08/02/12 0530 08/03/12 0515  08/04/12 0540 08/04/12 1300 08/05/12 0449  HGB 10.2*  < >  --   --  10.8* 9.3*  --  9.2*  --  9.9*  WBC 5.3  --   --   --  8.0 6.2  --  6.8  --  7.1  PLT 86*  --   --   --  94* 80*  --  80*  --  105*  NA 142  < >  --   --  140 136  < > 138 136 137  K 4.0  < >  --   --  3.5 2.7*  < >  2.7* 3.2* 3.5  CL 103  < >  --   --  98 93*  < > 96 97 96  CO2 35*  --   --   --  29 33*  < > 29 29 32  GLUCOSE 98  < >  --   --  81 107*  < > 105* 94 77  BUN 4*  < >  --   --  6 7  < > 10 7 5*  CREATININE 0.52  < >  --   --  0.59 0.58  < > 0.60 0.52 0.49*  CALCIUM 8.8  --   --   --  8.9 8.3*  < > 8.7 8.7 9.1  MG  --   --   --   < > 1.4* 1.7  --  1.7  --  1.8  PHOS  --   --   --   < > 2.2* 3.2  --  3.7  --  3.8  AST 13  --   --   --  18  --   --   --   --   --   ALT 5  --   --   --  6  --   --   --   --   --   ALKPHOS 33*  --   --   --  35*  --   --   --   --   --   BILITOT 0.2*  --   --   --  0.7  --   --   --   --   --   PROT 5.9*  --   --   --  6.2  --   --   --   --   --   ALBUMIN 2.5*  --   --   --  2.7*  --   --   --   --   --   PHART  --   --  7.418  --   --   --   --   --   --   --   PCO2ART  --   --  55.4*  --   --   --   --   --   --   --    PO2ART  --   --  256.0*  --   --   --   --   --   --   --   < > = values in this interval not displayed.  Recent Labs Lab 08/04/12 1531 08/04/12 1953 08/05/12 08/05/12 0426 08/05/12 0808  GLUCAP 95 87 83 74 81    Imaging: Dg Chest Port 1 View  08/05/2012   *RADIOLOGY REPORT*  Clinical Data: Follow up infiltrates  PORTABLE CHEST - 1 VIEW  Comparison: Portable exam 0608 hours compared to 08/04/2012  Findings: Left jugular central venous catheter with tip projecting over SVC unchanged. Previously identified endotracheal and nasogastric tubes have been removed. Enlargement of cardiac silhouette. Tortuous aorta. Improved right basilar atelectasis. Persistent atelectasis versus consolidation of left lower lobe. Question small left pleural effusion. No pneumothorax.  IMPRESSION: Improved right basilar aeration.  The   Original Report Authenticated By: Ulyses Southward, M.D.   Dg Chest Port 1 View  08/04/2012   *RADIOLOGY REPORT*  Clinical Data: Evaluate support apparatus  PORTABLE CHEST - 1 VIEW  Comparison: Prior chest x-ray 08/03/2012  Findings: The tip of the endotracheal tube is  3 cm above the carina.  Left IJ approach central venous catheter with catheter tip in the mid superior vena cava.  The tip of the nasogastric tube lies off the field of view, below the diaphragm likely within the stomach.  Increasing pulmonary vascular congestion with new interstitial and airspace opacities in the right base.  Persistent left basilar opacity.  Stable mild cardiomegaly.  Inspiratory volumes remain very low.  Probable left layering pleural effusion.  IMPRESSION:  1.  Increasing right basilar opacity likely reflects slightly progressed pulmonary edema and atelectasis.  New infiltrate is considered less likely. 2.  Stable left pleural effusion and associated basilar opacity 3.  Stable and satisfactory support apparatus   Original Report Authenticated By: Malachy Moan, M.D.     ASSESSMENT / PLAN: NEUROLOGIC A:   Seizure disorder, no clear evidence by EEG that R arm movements are active seizure Hx of hemorrhagic CVA with Lt sided hemiplegia. Chronic pain with chronic pain pump. Acute encephalopathy 2nd to accidental opiate overdose +/- seizure. P:   -continue keppra, valproate per neurology > home doses -morphine pump at minimal setting to maintain pump patency -resume ASA 5/30 -restart cymbalta 5/30  PULMONARY A: Acute respiratory failure 2nd to presumed aspiration pneumonitis and inability to protect airway. Hx of OSA with central apneas P:   -restart nocturnal BiPAP -f/u CXR -prn BD's  CARDIOVASCULAR A: Hx of HTN, hyperlipidemia. P:  -continue catapress patch, amlodipine, labetalol, cozaar, zocor -restart ASA  RENAL A: hypokalemia, refractory P:   -replace K prn -monitor renal fx, urine outpt, electrolytes  GASTROINTESTINAL A:  Nutrition. Diarrhea, likely due to TF, C diff negative P:   -protonix for SUP -restart diet  HEMATOLOGIC A: Anemia, thrombocytopenia. P:  -f/u CBC -SCD for DVT prevention  INFECTIOUS A:  Concern for aspiration pneumonitis, likely bibasilar atx. No leukocytosis or sputum 1 of 2 coag neg staph from 5/26 blood cx, 5/27 negative, speciation pending. Has prior hx both MRSA and VRE E coli UTI, R to cipro, sens to ceftriaxone, zosyn P:   -Decided not to treat coag negative staph, following off vanco -ceftriaxone on 5/29 for e coli resistant to cipro  ENDOCRINE A:  DM type II. Hx of hypothyroidism. P:   -SSI -continue synthroid  Transfer to SDU 5/30  Levy Pupa, MD, PhD 08/05/2012, 9:53 AM [facility] [phone] or if no answer [phone]

## 2012-08-05 NOTE — Clinical Social Work Note (Signed)
Clinical Social Work Department BRIEF PSYCHOSOCIAL ASSESSMENT 08/05/2012  Patient:  NELA, BASCOM     Account Number:  [id number]     Admit date:  08/01/2012  Clinical Social Worker:  Verl Blalock  Date/Time:  08/05/2012 10:00 AM  Referred by:  RN  Date Referred:  08/05/2012 Referred for  SNF Placement   Other Referral:   Interview type:  Family Other interview type:   Patient daughter over the phone    PSYCHOSOCIAL DATA Living Status:  FACILITY Admitted from facility:  [facility] Level of care:  Skilled Nursing Facility Primary support name:  Christel Mormon  [phone] Primary support relationship to patient:  CHILD, ADULT Degree of support available:   Strong    CURRENT CONCERNS Current Concerns  Post-Acute Placement   Other Concerns:    SOCIAL WORK ASSESSMENT / PLAN Clinical Social Worker spoke with patient family over the phone to offer support and discuss patient plans at discharge.  Patient daughter states that patient is a current resident at [facility] and plans to return there once medically stable.  CSW contacted facility who is agreeable with patient return once medically ready.  CSW remains available for support and to facilitate patient discharge needs.   Assessment/plan status:  Psychosocial Support/Ongoing Assessment of Needs Other assessment/ plan:   Information/referral to community resources:   Patient daughter states that there are no resources needed at this time.  Patient will return by ambulance at discharge.    PATIENT'S/FAMILY'S RESPONSE TO PLAN OF CARE: Patient alert and oriented but has intermittent periods of confusion.  Patient family is very involved in patient care and states that they are agreeable with patient return to [facility].  Patient family verbalized their appreciation for CSW support and involvement.   Macario Golds, [place] [phone]

## 2012-08-05 NOTE — Progress Notes (Signed)
NUTRITION FOLLOW UP  Intervention:   Ensure Complete po BID, each supplement provides 350 kcal and 13 grams of protein.  Nutrition Dx:   Inadequate oral intake related to inability to eat as evidenced by NPO status; progressing.   Goal:   Pt to meet >/= 90% of their estimated nutrition needs; not yet met.   Monitor:   PO intake, supplement acceptance, weight trend  Assessment:   Pt with hx of chronic pain/spacity requiring pain pump brought in from SNF with unresponsiveness. Pt developed seizures. Pt discussed during ICU rounds and with RN. Per RN she will advance diet today, RN offering crackers at present.  Pt started on enteral nutrition 5/27 but d/c'ed when extubated 5/29. Pt on nasal cannula but uses Bipap at night.  Pt remains confused, daughter not in room at this time.   Height: Ht Readings from Last 1 Encounters:  08/01/12 5\' 2"  (1.575 m)    Weight Status:   Wt Readings from Last 1 Encounters:  08/04/12 239 lb 10.2 oz (108.7 kg)  Admission weight 242 lb 5/26  Re-estimated needs:  Kcal: 1800-2000 Protein: 90-105 grams Fluid: > 1.8 L/day  Skin: skin tear   Diet Order: NPO   Intake/Output Summary (Last 24 hours) at 08/05/12 1045 Last data filed at 08/05/12 1000  Gross per 24 hour  Intake   1750 ml  Output   5725 ml  Net  -3975 ml    Last BM: 5/29 pt with flexiseal in place, c diff pending   Labs:   Recent Labs Lab 08/03/12 0515  08/04/12 0540 08/04/12 1300 08/05/12 0449  NA 136  < > 138 136 137  K 2.7*  < > 2.7* 3.2* 3.5  CL 93*  < > 96 97 96  CO2 33*  < > 29 29 32  BUN 7  < > 10 7 5*  CREATININE 0.58  < > 0.60 0.52 0.49*  CALCIUM 8.3*  < > 8.7 8.7 9.1  MG 1.7  --  1.7  --  1.8  PHOS 3.2  --  3.7  --  3.8  GLUCOSE 107*  < > 105* 94 77  < > = values in this interval not displayed.  CBG (last 3)   Recent Labs  08/05/12 08/05/12 0426 08/05/12 0808  GLUCAP 83 74 81    Scheduled Meds: . amLODipine  5 mg Per Tube Daily  . antiseptic  oral rinse  15 mL Mouth Rinse QID  . aspirin EC  81 mg Oral Daily  . cefTRIAXone (ROCEPHIN)  IV  1 g Intravenous Q24H  . chlorhexidine  15 mL Mouth Rinse BID  . cloNIDine  0.1 mg Transdermal Weekly  . DULoxetine  60 mg Oral Daily  . insulin aspart  0-20 Units Subcutaneous Q4H  . labetalol  10 mg Intravenous BID  . levETIRAcetam  2,000 mg Intravenous Q12H  . levothyroxine  68.5 mcg Per Tube QAC breakfast  . losartan  50 mg Per Tube Daily  . multivitamin  5 mL Per Tube Daily  . olopatadine  1 drop Both Eyes BID  . pantoprazole sodium  40 mg Per Tube Q24H  . polyethylene glycol  17 g Oral Daily  . simvastatin  20 mg Per Tube q1800  . sodium chloride  3 mL Intravenous Q12H  . valproate sodium  500 mg Intravenous Q12H    Continuous Infusions: . sodium chloride 50 mL/hr at 08/05/12 1000    Kendell Bane RD, LDN, CNSC [phone] Pager  [phone] After Hours Pager

## 2012-08-05 NOTE — Evaluation (Signed)
Physical Therapy Evaluation Patient Details Name: [name] MRN: [id number] DOB: 1955/09/06 Today's Date: 08/05/2012 Time: 4098-1191 PT Time Calculation (min): 14 min  PT Assessment / Plan / Recommendation Clinical Impression  Patient is a 57 yo female admitted with acute repsiratory failure, AMS, and seizures.  Patient is a resident of a SNF where she requires total assist for her care.  She is receiving restorative nursing care at [facility].  Recommend use of lift equipment for bed to chair. Recommend restorative care be continued at SNF for maintenance of maximal joint mobility.    PT Assessment  All further PT needs can be met in the next venue of care    Follow Up Recommendations  SNF    Does the patient have the potential to tolerate intense rehabilitation      Barriers to Discharge        Equipment Recommendations  None recommended by PT    Recommendations for Other Services     Frequency      Precautions / Restrictions Precautions Precautions: Fall Restrictions Weight Bearing Restrictions: No   Pertinent Vitals/Pain       Mobility  Bed Mobility Bed Mobility: Rolling Right;Rolling Left Rolling Right: 1: +2 Total assist Rolling Right: Patient Percentage: 10% Rolling Left: 1: +2 Total assist;With rail Rolling Left: Patient Percentage: 10% Details for Bed Mobility Assistance: Cues for technique.  Patient required total assist for rolling.  Attempted to have patient use RUE when rolling to left - difficulty reaching rail and holding onto rail. Transfers Transfers: Not assessed    Exercises     PT Diagnosis: Generalized weakness  PT Problem List: Decreased strength;Decreased range of motion;Decreased balance;Decreased mobility;Impaired sensation;Impaired tone;Obesity PT Treatment Interventions:   f/u in SNF  PT Goals  N/A  Visit Information  Last PT Received On: 08/05/12 Assistance Needed: +2    Subjective Data  Subjective: "I live at home with my  husband and son"  Patient reports she hasn't walked "in a year" Patient Stated Goal: "I'd like to walk"   Prior Functioning  Home Living Lives With: Other (Comment) (Patient at SNF) Available Help at Discharge: Skilled Nursing Facility Prior Function Level of Independence: Needs assistance Needs Assistance: Bathing;Dressing;Toileting;Meal Prep;Light Housekeeping;Transfers Bath: Total Dressing: Total Toileting: Total Meal Prep: Total Light Housekeeping: Total Transfer Assistance: total - using lift equipment for transfers Able to Take Stairs?: No Driving: No Vocation: On disability Communication Communication: No difficulties    Cognition  Cognition Arousal/Alertness: Awake/alert Behavior During Therapy: WFL for tasks assessed/performed Overall Cognitive Status: No family/caregiver present to determine baseline cognitive functioning    Extremity/Trunk Assessment Right Upper Extremity Assessment RUE ROM/Strength/Tone: Deficits RUE ROM/Strength/Tone Deficits: Strength 2+/5 RUE Sensation: WFL - Light Touch Left Upper Extremity Assessment LUE ROM/Strength/Tone: Deficits LUE ROM/Strength/Tone Deficits: Spasticity noted.  Hand in fisted position.  Attempted PROM - patient c/o pain in elbow with movement.  Unable to open hand passively. LUE Sensation: Deficits LUE Sensation Deficits: Unable to detect light touch Right Lower Extremity Assessment RLE ROM/Strength/Tone: Deficits RLE ROM/Strength/Tone Deficits: Hip and knee strength 2/5.  Rt ankle fused in PF.  Noted tremors with passive movement. RLE Sensation: WFL - Light Touch Left Lower Extremity Assessment LLE ROM/Strength/Tone: Deficits LLE ROM/Strength/Tone Deficits: Increased extensor tone throughout.  No active movement noted.  Decreased ROM in hip and ankle. LLE Sensation: Deficits LLE Sensation Deficits: Unable to detect light touch   Balance    End of Session PT - End of Session Activity Tolerance: Patient tolerated  treatment well Patient left: in bed;with call bell/phone within reach Nurse Communication: Need for lift equipment;Mobility status  GP     Vena Austria 08/05/2012, 5:48 PM

## 2012-08-05 NOTE — Progress Notes (Signed)
[facility] ADULT ICU REPLACEMENT PROTOCOL FOR AM LAB REPLACEMENT ONLY  The patient does apply for the [facility] Adult ICU Electrolyte Replacment Protocol based on the criteria listed below:   1. Is GFR >/= 40 ml/min? yes  Patient's GFR today is >90 2. Is urine output >/= 0.5 ml/kg/hr for the last 6 hours? yes Patient's UOP is 1.2 ml/kg/hr 3. Is BUN < 60 mg/dL? yes  Patient's BUN today is 5 4. Abnormal electrolyte(s): Mag 1.8 5. Ordered repletion with: per protocol 6. If a panic level lab has been reported, has the CCM MD in charge been notified? yes.   Physician:  Dr Salvadore Farber, Lacora Folmer A 08/05/2012 5:53 AM

## 2012-08-05 NOTE — Progress Notes (Signed)
Subjective: Extubated, some waxing and waning confusion.   Exam: Filed Vitals:   08/05/12 0700  BP: 149/86  Pulse: 83  Temp: 98.2 F (36.8 C)  Resp: 28   Gen: In bed, NAD MS: AWake, Alert, thinks [facility]  AV:[id number], Left field cut,  Motor: 5/5 on right side, but coarse tremor involving leg arm and mouth, plegic on left side.  Sensory:decreased on left.   Impression: 57 yo F presented with AMS suspected to be narcotic overdose. If furtehr episodes were to occur, would increase AED therapy, but no clear indication at this time that this was seizure, so will continue home AED meds.   I suspect taht she has a mild delirium as well acocounting for her confusino, states taht she has memory difficulty at baseline.   Recommendations:  1) Continue keppra 2000mg  BID  2) continue depakote 500mg  BID  Ritta Slot, MD [facility] [phone]  If 7pm- 7am, please page neurology on call at [phone].

## 2012-08-06 ENCOUNTER — Inpatient Hospital Stay (HOSPITAL_COMMUNITY): Payer: PRIVATE HEALTH INSURANCE

## 2012-08-06 DIAGNOSIS — Z5189 Encounter for other specified aftercare: Secondary | ICD-10-CM

## 2012-08-06 DIAGNOSIS — Z8673 Personal history of transient ischemic attack (TIA), and cerebral infarction without residual deficits: Secondary | ICD-10-CM

## 2012-08-06 LAB — GLUCOSE, CAPILLARY
Glucose-Capillary: 88 mg/dL (ref 70–99)
Glucose-Capillary: 80 mg/dL (ref 70–99)

## 2012-08-06 MED ORDER — LEVETIRACETAM 750 MG PO TABS
2000.0000 mg | ORAL_TABLET | Freq: Two times a day (BID) | ORAL | Status: DC
  Administered 2012-08-06 – 2012-08-09 (×6): 2000 mg via ORAL
  Filled 2012-08-06 (×7): qty 1

## 2012-08-06 MED ORDER — DIVALPROEX SODIUM 500 MG PO DR TAB
500.0000 mg | DELAYED_RELEASE_TABLET | Freq: Two times a day (BID) | ORAL | Status: DC
  Administered 2012-08-06 – 2012-08-09 (×6): 500 mg via ORAL
  Filled 2012-08-06 (×7): qty 1

## 2012-08-06 NOTE — Progress Notes (Signed)
Found patient with central line dressing off and pulling at other lines. Central line intact, however line appears that it has been manipulated. Stopped IVF. Cleansed site per protocol and applied sterile dressing. Notified Dr. Frederico Hamman. CXR ordered. Will continue to monitor.

## 2012-08-06 NOTE — Progress Notes (Signed)
PULMONARY  / CRITICAL CARE MEDICINE  Name: [name] MRN: [id number] DOB: 07/02/1955    ADMISSION DATE:  08/01/2012 CONSULTATION DATE:  08/01/12  REFERRING MD :  Houston Siren  CHIEF COMPLAINT:  AMS and siezure  BRIEF PATIENT DESCRIPTION:  57 yo female transferred from SNF with altered mental status after recent hospital stay at [facility].  Initial concern was for accidental opiate overdose.  Developed seizure with VDRF shortly after admission, and PCCM assumed care on transfer to ICU 5/26.    PMHx of Hemorrhagic CVA with Lt sided weakness, chronic pain with indwelling morphine pain pump, HTN, Seizure disorder, OSA, Depression, DM, Hypothyroidism  SIGNIFICANT EVENTS: 5/26 Admit, given narcan for possible opiate OD, seizure, VDRF, neurology consulted  STUDIES:  5/26 CT head >> No acute finding. 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 Old infarctions in the right basal ganglia/external capsule and thalamus. Old infarction in the left frontal white matter with focal calcification. 5/26 EEG >> encephalopathy, but no seizure activity to correlate with the R arm jerking movements  LINES / TUBES: ETT 5/26 >> 5/29 Lt IJ CVL 5/26 >>   CULTURES: 5/27 Blood >> NGTD >>  5/26 Blood (from new CVC) >> 1 of 2 coag negative staph >>  5/26 Urine >> E coli >> R to cefazolin, quinolones; S to ceftriaxone, zosyn, bactrim, gent 5/26 Sputum >> normal flora 5/29 C diff >> negative  ANTIBIOTICS: 5/26 vancomycin>>5/31 5/26 zoysn>> 5/28 5/28 cipro (E coli) >> 5/29 5/29 ceftriaxone (Ecoli) >>    SUBJECTIVE: She points out that today is her birthday! CVL was dislodged last night. Elink notified. Not to be used for CVL Still having R arm tremor  No new seizure activity reported Still w diarrhea, c diff negative, flexiseal in place  VITAL SIGNS: Temp:  [97.3 F (36.3 C)-99 F (37.2 C)] 97.3 F (36.3 C) (05/31 0722) Pulse Rate:  [77-90] 77 (05/31 0335) Resp:  [17-31] 17 (05/31 0335) BP:  (98-164)/(69-93) 136/87 mmHg (05/31 0335) SpO2:  [95 %-100 %] 97 % (05/31 0335) FiO2 (%):  [40 %] 40 % (05/31 0335) Weight:  [98.6 kg (217 lb 6 oz)] 98.6 kg (217 lb 6 oz) (05/31 0500) VENTILATOR: Vent Mode:  [-] BIPAP FiO2 (%):  [40 %] 40 % PEEP:  [5 cmH20] 5 cmH20 Pressure Support:  [5 cmH20] 5 cmH20 INTAKE / OUTPUT: Intake/Output     05/30 0701 - 05/31 0700 05/31 0701 - 06/01 0700   P.O. 120    I.V. (mL/kg) 1090 (11.1)    Other     IV Piggyback 280    Total Intake(mL/kg) 1490 (15.1)    Urine (mL/kg/hr) 3025 (1.3) 125 (0.6)   Stool 175 (0.1)    Total Output 3200 125   Net -1710 -125          PHYSICAL EXAMINATION: General: No distress, obese. Up in chair, interactive Neuro: Intermittent Rt side tremor, not moving Lt side, awake, interacting, some disorientation HEENT: OP clear Cardiovascular: regular, no murmur Lungs: quiet, shallow, unlabored Abdomen: soft, nontender, pain pump in LLQ Musculoskeletal: no edema Skin: no rashes  LABS:  Recent Labs Lab 08/01/12 0045  08/01/12 1032  08/02/12 0530 08/03/12 0515  08/04/12 0540 08/04/12 1300 08/05/12 0449 08/06/12 0455  HGB 10.2*  < >  --   --  10.8* 9.3*  --  9.2*  --  9.9*  --   WBC 5.3  --   --   --  8.0 6.2  --  6.8  --  7.1  --   PLT 86*  --   --   --  94* 80*  --  80*  --  105*  --   NA 142  < >  --   --  140 136  < > 138 136 137  --   K 4.0  < >  --   --  3.5 2.7*  < > 2.7* 3.2* 3.5  --   CL 103  < >  --   --  98 93*  < > 96 97 96  --   CO2 35*  --   --   --  29 33*  < > 29 29 32  --   GLUCOSE 98  < >  --   --  81 107*  < > 105* 94 77  --   BUN 4*  < >  --   --  6 7  < > 10 7 5*  --   CREATININE 0.52  < >  --   --  0.59 0.58  < > 0.60 0.52 0.49*  --   CALCIUM 8.8  --   --   --  8.9 8.3*  < > 8.7 8.7 9.1  --   MG  --   --   --   < > 1.4* 1.7  --  1.7  --  1.8 1.9  PHOS  --   --   --   < > 2.2* 3.2  --  3.7  --  3.8  --   AST 13  --   --   --  18  --   --   --   --   --   --   ALT 5  --   --   --  6  --   --    --   --   --   --   ALKPHOS 33*  --   --   --  35*  --   --   --   --   --   --   BILITOT 0.2*  --   --   --  0.7  --   --   --   --   --   --   PROT 5.9*  --   --   --  6.2  --   --   --   --   --   --   ALBUMIN 2.5*  --   --   --  2.7*  --   --   --   --   --   --   PHART  --   --  7.418  --   --   --   --   --   --   --   --   PCO2ART  --   --  55.4*  --   --   --   --   --   --   --   --   PO2ART  --   --  256.0*  --   --   --   --   --   --   --   --   < > = values in this interval not displayed.  Recent Labs Lab 08/05/12 1929 08/05/12 2324 08/06/12 0338 08/06/12 0810 08/06/12 0842  GLUCAP 87 88 80 67* 90    Imaging: Dg Chest Port 1 View  08/06/2012   *RADIOLOGY  REPORT*  Clinical Data: Central line position  PORTABLE CHEST - 1 VIEW  Comparison: 08/05/2012  Findings: Left IJ catheter coils over the neck and then extends inferomedially, with the tip positioned over the left brachiocephalic vein.  Cardiomegaly.  Hypoaeration.  Mediastinal prominence is similar to prior.  Small effusions not excluded.  No pneumothorax. No acute osseous finding.  IMPRESSION: The left IJ catheter has been retracted, tip projects over the left brachiocephalic vein.  Hypoaeration.  Bibasilar opacities, similar to prior.   Original Report Authenticated By: Jearld Lesch, M.D.   Dg Chest Port 1 View  08/05/2012   *RADIOLOGY REPORT*  Clinical Data: Follow up infiltrates  PORTABLE CHEST - 1 VIEW  Comparison: Portable exam 0608 hours compared to 08/04/2012  Findings: Left jugular central venous catheter with tip projecting over SVC unchanged. Previously identified endotracheal and nasogastric tubes have been removed. Enlargement of cardiac silhouette. Tortuous aorta. Improved right basilar atelectasis. Persistent atelectasis versus consolidation of left lower lobe. Question small left pleural effusion. No pneumothorax.  IMPRESSION: Improved right basilar aeration.  The   Original Report Authenticated By: Ulyses Southward, M.D.     ASSESSMENT / PLAN: NEUROLOGIC A:  Seizure disorder, no clear evidence by EEG that R arm movements are active seizure Hx of hemorrhagic CVA with Lt sided hemiplegia. Chronic pain with chronic pain pump. Acute encephalopathy 2nd to accidental opiate overdose +/- seizure. P:   Continuing current management- -continue keppra, valproate per neurology > home doses -morphine pump at minimal setting to maintain pump patency -resume ASA 5/30 -restart cymbalta 5/30  PULMONARY A: Acute respiratory failure 2nd to presumed aspiration pneumonitis and inability to protect airway. Hx of OSA with central apneas P:   -restarted nocturnal BiPAP -f/u CXR- look stable except CVL displaced as noted -prn BD's  CARDIOVASCULAR A: Hx of HTN, hyperlipidemia. P:  -continue catapress patch, amlodipine, labetalol, cozaar, zocor -restart ASA  RENAL A: hypokalemia, refractory Improving P:   -replace K prn -monitor renal fx, urine outpt, electrolytes  GASTROINTESTINAL A:  Nutrition. Diarrhea, likely due to TF, C diff negative P:   -protonix for SUP, Appreciate Nutrition Service in-put -restarted diet  HEMATOLOGIC A: Anemia, thrombocytopenia. P:  -f/u CBC -SCD for DVT prevention  INFECTIOUS A:  Concern for aspiration pneumonitis, likely bibasilar atx. No leukocytosis or sputum 1 of 2 coag neg staph from 5/26 blood cx, 5/27 negative, speciation pending. Has prior hx both MRSA and VRE E coli UTI, R to cipro, sens to ceftriaxone, zosyn P:   -Decided not to treat coag negative staph, following off vanco -ceftriaxone on 5/29 for e coli resistant to cipro  ENDOCRINE A:  DM type II. Hx of hypothyroidism. P:   -SSI -continue synthroid  Transfer to SDU 5/30  CD Young MD 08/06/2012, 9:09 AM [facility] [phone] or if no answer [phone]

## 2012-08-06 NOTE — Progress Notes (Signed)
I was called by the nurse; the patient pulled her central line.   CXR ordered stat; L IJ can still be used; not for CVP measurements.

## 2012-08-06 NOTE — Significant Event (Signed)
Around 1130am the aid notified me that the patient was covered in blood around her neck. Upon entering the room, I noticed the patients safety mitten was off and she had pulled her Right IJ line out of her neck. The patient was confused and saying bugs were crawling on her.  The patient was calmed down, mitten replaced and vaseline gauze placed over insertion site.  The patient was laid flat for 40 minutes.  The MD was notified and a peripheral line is being attempted by nursing and IV team.  Will continue to monitor patient and status.

## 2012-08-06 NOTE — Progress Notes (Signed)
Subjective: Patient awake, eating breakfast  Exam: Filed Vitals:   08/06/12 1105  BP: 160/107  Pulse: 94  Temp:   Resp: 36   Gen: In bed, NAD MS: Awake, Alert, initialyl states "twenty-four-twelve" in response to year but corrects herself to 2014. Knows that she is at [facility].  VH:QIONG, left field cut, left face droop Motor: 5/5 on right, plegic on left. Tremor in right arm, and sometims leg and jaw as well.  Sensory:intact to LT on right.   Impression: 57 yo F with acute encephalopathy prompting admission. I am hesitant to adjust home sz medications given that no clear seizure was seen, and there is a good possibility that narcotic overuse was responsible.   Recommendations: 1) I have changed AEDs to PO route.  2) will sign off at this time, please call if any questions or concerns remain.   Ritta Slot, MD [facility] [phone]  If 7pm- 7am, please page neurology on call at [phone].

## 2012-08-07 LAB — CULTURE, BLOOD (ROUTINE X 2): Culture: NO GROWTH

## 2012-08-07 MED ORDER — PANTOPRAZOLE SODIUM 40 MG PO TBEC
40.0000 mg | DELAYED_RELEASE_TABLET | ORAL | Status: DC
  Administered 2012-08-07: 40 mg via ORAL
  Filled 2012-08-07: qty 1

## 2012-08-07 MED ORDER — PANTOPRAZOLE SODIUM 40 MG PO PACK
40.0000 mg | PACK | ORAL | Status: DC
  Filled 2012-08-07 (×2): qty 20

## 2012-08-07 NOTE — Progress Notes (Signed)
Placed patient on BIPAP for the night with IPAP set at 10cm and EPAP set at 5cm and Fio2 of 40%

## 2012-08-07 NOTE — Progress Notes (Signed)
PULMONARY  / CRITICAL CARE MEDICINE  Name: [name] MRN: [id number] DOB: 1955-08-27    ADMISSION DATE:  08/01/2012 CONSULTATION DATE:  08/01/12  REFERRING MD :  Houston Siren  CHIEF COMPLAINT:  AMS and siezure  BRIEF PATIENT DESCRIPTION:  57 yo female transferred from SNF with altered mental status after recent hospital stay at [facility].  Initial concern was for accidental opiate overdose.  Developed seizure with VDRF shortly after admission, and PCCM assumed care on transfer to ICU 5/26.    PMHx of Hemorrhagic CVA with Lt sided weakness, chronic pain with indwelling morphine pain pump, HTN, Seizure disorder, OSA, Depression, DM, Hypothyroidism  SIGNIFICANT EVENTS: 5/26 Admit, given narcan for possible opiate OD, seizure, VDRF, neurology consulted>signed off 5/31  STUDIES:  5/26 CT head >> No acute finding. 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 Old infarctions in the right basal ganglia/external capsule and thalamus. Old infarction in the left frontal white matter with focal calcification. 5/26 EEG >> encephalopathy, but no seizure activity to correlate with the R arm jerking movements  LINES / TUBES: ETT 5/26 >> 5/29 Lt IJ CVL 5/26 >> 5/31  CULTURES: 5/27 Blood >> NGTD >>  5/26 Blood (from new CVC) >> 1 of 2 coag negative staph >>  5/26 Urine >> E coli >> R to cefazolin, quinolones; S to ceftriaxone, zosyn, bactrim, gent 5/26 Sputum >> normal flora 5/29 C diff >> negative  ANTIBIOTICS: 5/26 vancomycin>>5/31 5/26 zoysn>> 5/28 5/28 cipro (E coli) >> 5/29 5/29 ceftriaxone (Ecoli) >>    SUBJECTIVE: She points out that today is her birthday! Patient confused overnight, disoriented, bugs crawling, pulled IJ line out. Neurology signed off, continuing home SZ meds. Still having R arm tremor  No new seizure activity reported Still w diarrhea, c diff negative, flexiseal in place  VITAL SIGNS: Temp:  [98.7 F (37.1 C)-99.4 F (37.4 C)] 98.7 F (37.1 C) (06/01  0740) Pulse Rate:  [78-94] 82 (06/01 0408) Resp:  [23-36] 30 (06/01 0740) BP: (136-168)/(78-107) 168/106 mmHg (06/01 0740) SpO2:  [93 %-98 %] 97 % (06/01 0740) Weight:  [100.1 kg (220 lb 10.9 oz)] 100.1 kg (220 lb 10.9 oz) (06/01 0408) VENTILATOR:   INTAKE / OUTPUT: Intake/Output     05/31 0701 - 06/01 0700 06/01 0701 - 06/02 0700   P.O. 480    I.V. (mL/kg) 985.8 (9.8)    IV Piggyback 105    Total Intake(mL/kg) 1570.8 (15.7)    Urine (mL/kg/hr) 1225 (0.5) 450 (2)   Stool     Total Output 1225 450   Net +345.8 -450          PHYSICAL EXAMINATION: General: No distress, obese. Doesn't remember me from yesterday Neuro: Intermittent Rt side tremor, not moving Lt side, awake, interacting, some disorientation HEENT: OP clear Cardiovascular: regular, no murmur Lungs: quiet, shallow, unlabored Abdomen: soft, nontender, pain pump in LLQ. Foley cath. Musculoskeletal: no edema Skin: no rashes  LABS:  Recent Labs Lab 08/01/12 0045  08/01/12 1032  08/02/12 0530 08/03/12 0515  08/04/12 0540 08/04/12 1300 08/05/12 0449 08/06/12 0455  HGB 10.2*  < >  --   --  10.8* 9.3*  --  9.2*  --  9.9*  --   WBC 5.3  --   --   --  8.0 6.2  --  6.8  --  7.1  --   PLT 86*  --   --   --  94* 80*  --  80*  --  105*  --   NA 142  < >  --   --  140 136  < > 138 136 137  --   K 4.0  < >  --   --  3.5 2.7*  < > 2.7* 3.2* 3.5  --   CL 103  < >  --   --  98 93*  < > 96 97 96  --   CO2 35*  --   --   --  29 33*  < > 29 29 32  --   GLUCOSE 98  < >  --   --  81 107*  < > 105* 94 77  --   BUN 4*  < >  --   --  6 7  < > 10 7 5*  --   CREATININE 0.52  < >  --   --  0.59 0.58  < > 0.60 0.52 0.49*  --   CALCIUM 8.8  --   --   --  8.9 8.3*  < > 8.7 8.7 9.1  --   MG  --   --   --   < > 1.4* 1.7  --  1.7  --  1.8 1.9  PHOS  --   --   --   < > 2.2* 3.2  --  3.7  --  3.8  --   AST 13  --   --   --  18  --   --   --   --   --   --   ALT 5  --   --   --  6  --   --   --   --   --   --   ALKPHOS 33*  --   --   --   35*  --   --   --   --   --   --   BILITOT 0.2*  --   --   --  0.7  --   --   --   --   --   --   PROT 5.9*  --   --   --  6.2  --   --   --   --   --   --   ALBUMIN 2.5*  --   --   --  2.7*  --   --   --   --   --   --   PHART  --   --  7.418  --   --   --   --   --   --   --   --   PCO2ART  --   --  55.4*  --   --   --   --   --   --   --   --   PO2ART  --   --  256.0*  --   --   --   --   --   --   --   --   < > = values in this interval not displayed.  Recent Labs Lab 08/06/12 0810 08/06/12 0842 08/06/12 1232 08/06/12 1708 08/06/12 1956  GLUCAP 67* 90 100* 108* 110*    Imaging: Dg Chest Port 1 View  08/06/2012   *RADIOLOGY REPORT*  Clinical Data: Central line position  PORTABLE CHEST - 1 VIEW  Comparison: 08/05/2012  Findings: Left IJ catheter coils over the neck and then extends inferomedially,  with the tip positioned over the left brachiocephalic vein.  Cardiomegaly.  Hypoaeration.  Mediastinal prominence is similar to prior.  Small effusions not excluded.  No pneumothorax. No acute osseous finding.  IMPRESSION: The left IJ catheter has been retracted, tip projects over the left brachiocephalic vein.  Hypoaeration.  Bibasilar opacities, similar to prior.   Original Report Authenticated By: Jearld Lesch, M.D.     ASSESSMENT / PLAN: NEUROLOGIC A:  Seizure disorder, no clear evidence by EEG that R arm movements are active seizure Hx of hemorrhagic CVA with Lt sided hemiplegia. Chronic pain with chronic pain pump. Acute encephalopathy 2nd to accidental opiate overdose +/- seizure. Neuro has signed off 5/31 Confusion  P:   Continuing current management- -continue keppra, valproate per neurology > home doses -morphine pump at minimal setting to maintain pump patency -resume ASA 5/30 -restart cymbalta 5/30  PULMONARY A: Acute respiratory failure 2nd to presumed aspiration pneumonitis and inability to protect airway. Hx of OSA with central apneas P:   -restarted  nocturnal BiPAP- refused by patient night of 5/31 -prn BD's  CARDIOVASCULAR A: Hx of HTN, hyperlipidemia. P:  -continue catapress patch, amlodipine, labetalol, cozaar, zocor -restart ASA  RENAL A: hypokalemia, refractory Improving P:   -replace K prn -monitor renal fx, urine outpt, electrolytes  GASTROINTESTINAL A:  Nutrition. Diarrhea, likely due to TF, C diff negative P:   -protonix for SUP, Appreciate Nutrition Service in-put -restarted diet  HEMATOLOGIC A: Anemia, thrombocytopenia. P:  -f/u CBC -SCD for DVT prevention  INFECTIOUS A:  Concern for aspiration pneumonitis, likely bibasilar atx. No leukocytosis or sputum 1 of 2 coag neg staph from 5/26 blood cx, 5/27 negative, speciation pending. Has prior hx both MRSA and VRE E coli UTI, R to cipro, sens to ceftriaxone, zosyn P:   -Decided not to treat coag negative staph, following off vanco -ceftriaxone on 5/29 for e coli resistant to cipro  ENDOCRINE A:  DM type II. Hx of hypothyroidism. P:   -SSI -continue synthroid  Consider transfer to floor if mental status stable  CD Matthewjames Petrasek MD 08/07/2012, 9:17 AM [facility] [phone] or if no answer [phone]

## 2012-08-08 LAB — CULTURE, BLOOD (ROUTINE X 2): Culture: NO GROWTH

## 2012-08-08 MED ORDER — LOSARTAN POTASSIUM 50 MG PO TABS
50.0000 mg | ORAL_TABLET | Freq: Every day | ORAL | Status: DC
  Administered 2012-08-08 – 2012-08-09 (×2): 50 mg via ORAL
  Filled 2012-08-08 (×2): qty 1

## 2012-08-08 MED ORDER — FAMOTIDINE 20 MG PO TABS
20.0000 mg | ORAL_TABLET | Freq: Two times a day (BID) | ORAL | Status: DC
  Administered 2012-08-08 – 2012-08-09 (×3): 20 mg via ORAL
  Filled 2012-08-08 (×5): qty 1

## 2012-08-08 MED ORDER — LEVOTHYROXINE SODIUM 137 MCG PO TABS
68.5000 ug | ORAL_TABLET | Freq: Every day | ORAL | Status: DC
  Administered 2012-08-09: 68.5 ug via ORAL
  Filled 2012-08-08 (×2): qty 0.5

## 2012-08-08 MED ORDER — PNEUMOCOCCAL 13-VAL CONJ VACC IM SUSP: 0.5000 mL | INTRAMUSCULAR | Status: DC

## 2012-08-08 MED ORDER — PNEUMOCOCCAL VAC POLYVALENT 25 MCG/0.5ML IJ INJ
0.5000 mL | INJECTION | INTRAMUSCULAR | Status: DC
  Filled 2012-08-08: qty 0.5

## 2012-08-08 NOTE — Progress Notes (Signed)
Placed patient on auto titrate 20 max 8 min with 2 liter 02 bleed in and medium full face mask.  Patient tolerating well.

## 2012-08-08 NOTE — Progress Notes (Addendum)
PULMONARY  / CRITICAL CARE MEDICINE  Name: [name] MRN: [id number] DOB: Sep 30, 1955    ADMISSION DATE:  08/01/2012 CONSULTATION DATE:  08/01/12  REFERRING MD :  Houston Siren  CHIEF COMPLAINT:  AMS and siezure  BRIEF PATIENT DESCRIPTION:  57 yo female transferred from SNF with altered mental status after recent hospital stay at [facility].  Initial concern was for accidental opiate overdose.  Developed seizure with VDRF shortly after admission, and PCCM assumed care on transfer to ICU 5/26.    PMHx of Hemorrhagic CVA with Lt sided weakness, chronic pain with indwelling morphine pain pump, HTN, Seizure disorder, OSA, Depression, DM, Hypothyroidism  SIGNIFICANT EVENTS: 5/26 Admit, given narcan for possible opiate OD, seizure, VDRF, neurology consulted>signed off 5/31  STUDIES:  5/26 CT head >> No acute finding. 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 Old infarctions in the right basal ganglia/external capsule and thalamus. Old infarction in the left frontal white matter with focal calcification. 5/26 EEG >> encephalopathy, but no seizure activity to correlate with the R arm jerking movements  LINES / TUBES: ETT 5/26 >> 5/29 Lt IJ CVL 5/26 >> 5/31  CULTURES: 5/27 Blood >> NGTD >>  5/26 Blood (from new CVC) >> 1 of 2 coag negative staph >> coag neg staph 5/26 Urine >> E coli >> R to cefazolin, quinolones; S to ceftriaxone, zosyn, bactrim, gent 5/26 Sputum >> normal flora 5/29 C diff >> negative  ANTIBIOTICS: 5/26 vancomycin>>5/31 5/26 zoysn>> 5/28 5/28 cipro (E coli) >> 5/29 5/29 ceftriaxone (Ecoli) >> 6/2    SUBJECTIVE: She points out that today is her birthday! Less agitated  Neurology signed off, continuing home SZ meds. Still having R arm tremor  No new seizure activity reported  VITAL SIGNS: Temp:  [98.2 F (36.8 C)-99.6 F (37.6 C)] 98.9 F (37.2 C) (06/02 0812) Pulse Rate:  [72-90] 72 (06/02 0800) Resp:  [17-36] 22 (06/02 0800) BP: (115-148)/(65-90)  138/81 mmHg (06/02 0700) SpO2:  [96 %-100 %] 100 % (06/02 0800) FiO2 (%):  [40 %] 40 % (06/02 0325) Weight:  [100.6 kg (221 lb 12.5 oz)] 100.6 kg (221 lb 12.5 oz) (06/02 0325) VENTILATOR: Vent Mode:  [-]  FiO2 (%):  [40 %] 40 % On bipap qhs and off day time, is on home cpap INTAKE / OUTPUT: Intake/Output     06/01 0701 - 06/02 0700 06/02 0701 - 06/03 0700   P.O.     I.V. (mL/kg) 1200 (11.9)    IV Piggyback     Total Intake(mL/kg) 1200 (11.9)    Urine (mL/kg/hr) 1225 (0.5) 125 (0.5)   Total Output 1225 125   Net -25 -125          PHYSICAL EXAMINATION: General: No distress, obese. Doesn't remember me from yesterday Neuro: Intermittent Rt side tremor, not moving Lt side, awake, interacting, some disorientation HEENT: OP clear Cardiovascular: regular, no murmur Lungs: quiet, shallow, unlabored Abdomen: soft, nontender, pain pump in LLQ.  Musculoskeletal: no edema Skin: no rashes  LABS:  Recent Labs Lab 08/01/12 1032  08/02/12 0530 08/03/12 0515  08/04/12 0540 08/04/12 1300 08/05/12 0449 08/06/12 0455  HGB  --   < > 10.8* 9.3*  --  9.2*  --  9.9*  --   WBC  --   < > 8.0 6.2  --  6.8  --  7.1  --   PLT  --   < > 94* 80*  --  80*  --  105*  --  NA  --   < > 140 136  < > 138 136 137  --   K  --   < > 3.5 2.7*  < > 2.7* 3.2* 3.5  --   CL  --   < > 98 93*  < > 96 97 96  --   CO2  --   < > 29 33*  < > 29 29 32  --   GLUCOSE  --   < > 81 107*  < > 105* 94 77  --   BUN  --   < > 6 7  < > 10 7 5*  --   CREATININE  --   < > 0.59 0.58  < > 0.60 0.52 0.49*  --   CALCIUM  --   < > 8.9 8.3*  < > 8.7 8.7 9.1  --   MG  --   < > 1.4* 1.7  --  1.7  --  1.8 1.9  PHOS  --   < > 2.2* 3.2  --  3.7  --  3.8  --   AST  --   --  18  --   --   --   --   --   --   ALT  --   --  6  --   --   --   --   --   --   ALKPHOS  --   --  35*  --   --   --   --   --   --   BILITOT  --   --  0.7  --   --   --   --   --   --   PROT  --   --  6.2  --   --   --   --   --   --   ALBUMIN  --   --  2.7*   --   --   --   --   --   --   PHART 7.418  --   --   --   --   --   --   --   --   PCO2ART 55.4*  --   --   --   --   --   --   --   --   PO2ART 256.0*  --   --   --   --   --   --   --   --   < > = values in this interval not displayed.  Recent Labs Lab 08/06/12 0810 08/06/12 0842 08/06/12 1232 08/06/12 1708 08/06/12 1956  GLUCAP 67* 90 100* 108* 110*    Imaging: No results found.   ASSESSMENT / PLAN: NEUROLOGIC A:  Seizure disorder, no clear evidence by EEG that R arm movements are active seizure Hx of hemorrhagic CVA with Lt sided hemiplegia. Chronic pain with chronic pain pump. Acute encephalopathy 2nd to accidental opiate overdose +/- seizure. Neuro has signed off 5/31 Confusion  P:   Continuing current management- -continue keppra, valproate per neurology > home doses -morphine pump at minimal setting to maintain pump patency -cont ASA -cont  cymbalta 5/30  PULMONARY A: Acute respiratory failure 2nd to presumed aspiration pneumonitis and inability to protect airway. Hx of OSA with central apneas P:   -restarted nocturnal BiPAP -prn BD's  CARDIOVASCULAR A: Hx of HTN, hyperlipidemia. P:  -continue catapress patch, amlodipine, labetalol, cozaar, zocor -  cont ASA  RENAL A: hypokalemia, refractory Improving P:   -replace K prn -monitor renal fx, urine outpt, electrolytes  GASTROINTESTINAL A:  Nutrition. Diarrhea, likely due to TF, C diff negative, better off TF P:   -change PPI to H2 with diarrhea -restarted diet  HEMATOLOGIC A: Anemia, thrombocytopenia. P:  -f/u CBC -SCD for DVT prevention  INFECTIOUS A:  Concern for aspiration pneumonitis, likely bibasilar atx. No leukocytosis or sputum>>resolved on CXR 1 of 2 coag neg staph from 5/26 blood cx, 5/27 negative, speciation pending. Has prior hx both MRSA and VRE E coli UTI, R to cipro, sens to ceftriaxone, zosyn>>full course of 7days Rx completed 08/08/12 P:   D/c all ABX as of  6/2  ENDOCRINE A:  DM type II. Hx of hypothyroidism. P:   -SSI -continue synthroid  Transfer to floor and ask TRH to assume primary medical care 08/09/12 Pt is a resident of [facility], will eventually need SNF bed  Caryl Bis  [phone]  Cell  [phone]  If no response or cell goes to voicemail, call beeper [phone]   08/08/2012, 9:25 AM [facility]

## 2012-08-08 NOTE — Progress Notes (Signed)
Occupational Therapy Discharge Patient Details Name: [name] MRN: [id number] DOB: 02/01/1956 Today's Date: 08/08/2012 Time:  -     Patient discharged from OT services secondary to Patient is a resident of a SNF where she requires total assist for her care. Will defer OT eval back to that facility as they deem appropriate.           Evette Georges [phone] 08/08/2012, 4:13 PM

## 2012-08-09 DIAGNOSIS — T50901S Poisoning by unspecified drugs, medicaments and biological substances, accidental (unintentional), sequela: Secondary | ICD-10-CM

## 2012-08-09 MED ORDER — CLONIDINE HCL 0.1 MG/24HR TD PTWK: 1.0000 | MEDICATED_PATCH | TRANSDERMAL | Status: DC

## 2012-08-09 MED ORDER — LEVOTHYROXINE SODIUM 137 MCG PO TABS: 68.5000 ug | ORAL_TABLET | Freq: Every day | ORAL | Status: DC

## 2012-08-09 NOTE — Clinical Social Work Note (Signed)
Clinical Social Worker facilitated discharge by contacting daughter and facility, [facility]. Patient will be transported via ambulance. CSW will complete discharge packet with EMS form. CSW will sign off, as social work intervention is no longer needed.   Rozetta Nunnery MSW, [employer]

## 2012-08-09 NOTE — Discharge Summary (Signed)
Physician Discharge Summary  Patient ID: [name] MRN: [id number] DOB/AGE: [birth year] 57 y.o.  Admit date: 08/01/2012 Discharge date: 08/09/2012    Discharge Diagnoses:  Principal Problem:   Acute respiratory failure Active Problems:   Narcotic overdose   Morbid obesity   Chronic pain disorder   Thrombocytopenia, unspecified   Central sleep apnea   Hemiparesis affecting left side as late effect of cerebrovascular accident   Unspecified hypothyroidism   Seizure disorder    Hospital Summary: [name] is a 57 y.o. y/o female with a PMH of Hemorrhagic CVA with Lt sided weakness, chronic pain with indwelling morphine pain pump, HTN, Seizure disorder, OSA, Depression, DM, Hypothyroidism presented to [facility] ER from SNF with altered mental status after recent hospital stay at [facility]. Initial concern was for accidental opiate overdose. Developed seizure with ventilator dependent respiratory failure shortly after admission, and PCCM assumed care on transfer to ICU 5/26.  On admit, her chronic pain pump was reduced.  Prior to intubation, she was encephalopathic with R arm jerking noted.  No seizure activity to correlate with AMS on admit.  Neurology consulted for evaluation.  Patient had a low valproic acid level and was re-loaded with IV depacon.  CT of head negative.  Pan cultures noted to be positive for E-Coli in Urine and treated with abx as below.  She remained on mechanical ventilation until 5/29 at which time she was successfully liberated from positive pressure ventilation.            DISCHARGE INSTRUCTIONS BY DIAGNOSIS    Seizure disorder - no clear evidence by EEG that R arm movements are active seizure  Hx of hemorrhagic CVA with Lt sided hemiplegia.  Chronic pain with chronic pain pump.  Acute encephalopathy - secondary to accidental opiate overdose +/- seizure.    Discharge Instructions:  -continue keppra, valproate.  Back to home doses. -recommend follow up  valproate level PRN -morphine pump at minimal setting to maintain pump patency, consider d/c all together and significant reduction of narcotic use as tolerated -continue ASA  -continue cymbalta   Acute respiratory failure - secondary to presumed aspiration pneumonitis and inability to protect airway.  Hx of OSA with central apneas  Concern for aspiration pneumonitis -  likely bibasilar atx. No leukocytosis or sputum>>resolved on CXR   Discharge Instructions: -continue nocturnal BiPAP  -BD's as needed   Hx of HTN, hyperlipidemia.   Discharge Instructions: -continue catapress patch, amlodipine, labetalol, cozaar, zocor  -cont ASA   Hypokalemia  Discharge Instructions: -follow up BMP on 6/5   Diarrhea - due to TF, C diff negative, resolved off tube feeding  Discharge Instructions: -continue heart healthy, low sodium diet   Anemia, thrombocytopenia.   Discharge Instructions: -f/u CBC PRN  1 of 2 coag neg staph from 5/26 blood cx, 5/27 negative, speciation pending.  Has prior hx both MRSA and VRE  E coli UTI - R to cipro, sens to ceftriaxone, zosyn>>full course of 7days Rx completed 08/08/12   Discharge Instructions: -off all abx as of 6/2    Hyperglycemia / ? DM II Hx of hypothyroidism.   Discharge Instructions: -continue synthroid daily dosing, dosing adjusted -recommend repeat TSH in 6 weeks      SIGNIFICANT EVENTS:  5/26 Admit, given narcan for possible opiate OD, seizure, VDRF, neurology consulted>signed off 5/31   STUDIES:  5/26 CT head >> No acute finding. 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 Old infarctions in the right basal ganglia/external  capsule and thalamus. Old infarction in the left frontal white matter with focal calcification.  5/26 EEG >> encephalopathy, but no seizure activity to correlate with the R arm jerking movements   LINES / TUBES:  ETT 5/26 >> 5/29  Lt IJ CVL 5/26 >> 5/31   CULTURES:  5/27 Blood >> NGTD >>  5/26  Blood (from new CVC) >> 1 of 2 coag negative staph >> coag neg staph  5/26 Urine >> E coli >> R to cefazolin, quinolones; S to ceftriaxone, zosyn, bactrim, gent  5/26 Sputum >> normal flora  5/29 C diff >> negative   ANTIBIOTICS:  5/26 vancomycin>>5/31  5/26 zoysn>> 5/28  5/28 cipro (E coli) >> 5/29  5/29 ceftriaxone (Ecoli) >> 6/2       Discharge Exam: General: No distress, obese.  Neuro: Intermittent Rt side tremor, not moving Lt side, awake, interacting, pleasant  HEENT: OP clear  Cardiovascular: regular, no murmur  Lungs: quiet, shallow, unlabored  Abdomen: soft, nontender, pain pump in LLQ.  Musculoskeletal: no edema  Skin: no rashes   Filed Vitals:   08/08/12 1700 08/08/12 2102 08/09/12 0155 08/09/12 0528  BP: 142/85 145/81 124/69 141/80  Pulse: 84 85 80 80  Temp: 98.9 F (37.2 C) 98.8 F (37.1 C) 97.3 F (36.3 C) 98.3 F (36.8 C)  TempSrc: Oral Oral Oral Oral  Resp: 22 20 20 20   Height:      Weight:    220 lb 7.4 oz (100 kg)  SpO2: 94% 96% 97% 98%     Discharge Labs  BMET  Recent Labs Lab 08/03/12 0515 08/03/12 1700 08/04/12 0540 08/04/12 1300 08/05/12 0449 08/06/12 0455  NA 136 138 138 136 137  --   K 2.7* 3.2* 2.7* 3.2* 3.5  --   CL 93* 97 96 97 96  --   CO2 33* 29 29 29  32  --   GLUCOSE 107* 100* 105* 94 77  --   BUN 7 9 10 7  5*  --   CREATININE 0.58 0.63 0.60 0.52 0.49*  --   CALCIUM 8.3* 8.8 8.7 8.7 9.1  --   MG 1.7  --  1.7  --  1.8 1.9  PHOS 3.2  --  3.7  --  3.8  --     CBC  Recent Labs Lab 08/03/12 0515 08/04/12 0540 08/05/12 0449  HGB 9.3* 9.2* 9.9*  HCT 27.8* 27.7* 29.4*  WBC 6.2 6.8 7.1  PLT 80* 80* 105*    Discharge Orders   Future Orders Complete By Expires     Call MD for:  difficulty breathing, headache or visual disturbances  As directed     Call MD for:  extreme fatigue  As directed     Call MD for:  persistant dizziness or light-headedness  As directed     Call MD for:  temperature >100.4  As directed      Diet - low sodium heart healthy  As directed     Discharge instructions  As directed     Comments:      1. Pain Pump Reduced to Baclofen 12.2 mcg/day and Morphine 0.172 mg/day. 2. Continue CPAP QHS    Increase activity slowly  As directed          Medication List    STOP taking these medications       cloNIDine 0.1 MG tablet  Commonly known as:  CATAPRES      TAKE these medications  amLODipine 5 MG tablet  Commonly known as:  NORVASC  Take 5 mg by mouth daily.     ANBESOL 10 % mucosal gel  Generic drug:  benzocaine  Use as directed 1 application in the mouth or throat as needed for pain (for tooth pain).     aspirin EC 81 MG tablet  Take 81 mg by mouth daily.     cloNIDine 0.1 mg/24hr patch  Commonly known as:  CATAPRES - Dosed in mg/24 hr  Place 1 patch (0.1 mg total) onto the skin once a week.     divalproex 500 MG DR tablet  Commonly known as:  DEPAKOTE  Take 500 mg by mouth 2 (two) times daily.     DULoxetine 60 MG capsule  Commonly known as:  CYMBALTA  Take 60 mg by mouth daily.     DUONEB 0.5-2.5 (3) MG/3ML Soln  Generic drug:  ipratropium-albuterol  Take 3 mLs by nebulization every 6 (six) hours as needed (for shortness of breath/wheezing).     hydrOXYzine 25 MG tablet  Commonly known as:  ATARAX/VISTARIL  Take 12.5 mg by mouth daily as needed for itching or anxiety.     labetalol 200 MG tablet  Commonly known as:  NORMODYNE  Take 200 mg by mouth 2 (two) times daily.     levETIRAcetam 1000 MG tablet  Commonly known as:  KEPPRA  Take 2,000 mg by mouth 2 (two) times daily.     levothyroxine 137 MCG tablet  Commonly known as:  SYNTHROID, LEVOTHROID  Take 0.5 tablets (68.5 mcg total) by mouth daily before breakfast.     losartan 50 MG tablet  Commonly known as:  COZAAR  Take 1 tablet (50 mg total) by mouth at bedtime.     olopatadine 0.1 % ophthalmic solution  Commonly known as:  PATANOL  Place 1 drop into both eyes 2 (two) times daily.      omeprazole 20 MG capsule  Commonly known as:  PRILOSEC  Take 20 mg by mouth daily.     polyethylene glycol packet  Commonly known as:  MIRALAX / GLYCOLAX  Take 17 g by mouth daily.     PRESCRIPTION MEDICATION  1 Device by Intrathecal route continuous. Per nursing home [facility], patient had Baclofen and Morphine in her pain pump.     simvastatin 20 MG tablet  Commonly known as:  ZOCOR  Take 20 mg by mouth at bedtime.     Vitamin D-3 1000 UNITS Caps  Take 3 capsules by mouth daily.          Disposition:  Discharge to SNF.  Follow up with Dr. Leanord Hawking at [facility].   Discharged Condition: [name] has met maximum benefit of inpatient care and is medically stable and cleared for discharge.  Patient is pending follow up as above.      Time spent on disposition:  Greater than 35 minutes.   Signed: Canary Brim, NP-C [facility] Pgr: [phone]  Levy Pupa, MD, PhD 08/09/2012, 5:21 PM [facility] [phone] or if no answer [phone]

## 2012-08-09 NOTE — Progress Notes (Signed)
Report called to nurse at [facility].

## 2012-08-15 DIAGNOSIS — I1310 Hypertensive heart and chronic kidney disease without heart failure, with stage 1 through stage 4 chronic kidney disease, or unspecified chronic kidney disease: Secondary | ICD-10-CM

## 2012-08-15 DIAGNOSIS — R4182 Altered mental status, unspecified: Secondary | ICD-10-CM

## 2012-08-15 DIAGNOSIS — G4733 Obstructive sleep apnea (adult) (pediatric): Secondary | ICD-10-CM

## 2012-09-16 ENCOUNTER — Inpatient Hospital Stay (HOSPITAL_COMMUNITY)
Admission: EM | Admit: 2012-09-16 | Discharge: 2012-09-23 | DRG: 917 | Disposition: A | Discharge status: Skilled Nursing Facility | Payer: PRIVATE HEALTH INSURANCE | Attending: Internal Medicine | Admitting: Internal Medicine

## 2012-09-16 ENCOUNTER — Emergency Department (HOSPITAL_COMMUNITY): Payer: PRIVATE HEALTH INSURANCE

## 2012-09-16 ENCOUNTER — Inpatient Hospital Stay (HOSPITAL_COMMUNITY): Payer: PRIVATE HEALTH INSURANCE

## 2012-09-16 ENCOUNTER — Encounter (HOSPITAL_COMMUNITY): Payer: Self-pay | Admitting: Emergency Medicine

## 2012-09-16 DIAGNOSIS — R0689 Other abnormalities of breathing: Secondary | ICD-10-CM

## 2012-09-16 DIAGNOSIS — F3289 Other specified depressive episodes: Secondary | ICD-10-CM | POA: Diagnosis present

## 2012-09-16 DIAGNOSIS — G4731 Primary central sleep apnea: Secondary | ICD-10-CM | POA: Diagnosis present

## 2012-09-16 DIAGNOSIS — Z8673 Personal history of transient ischemic attack (TIA), and cerebral infarction without residual deficits: Secondary | ICD-10-CM

## 2012-09-16 DIAGNOSIS — G40909 Epilepsy, unspecified, not intractable, without status epilepticus: Secondary | ICD-10-CM | POA: Diagnosis present

## 2012-09-16 DIAGNOSIS — I69354 Hemiplegia and hemiparesis following cerebral infarction affecting left non-dominant side: Secondary | ICD-10-CM

## 2012-09-16 DIAGNOSIS — N390 Urinary tract infection, site not specified: Secondary | ICD-10-CM | POA: Diagnosis not present

## 2012-09-16 DIAGNOSIS — R4182 Altered mental status, unspecified: Secondary | ICD-10-CM

## 2012-09-16 DIAGNOSIS — I69959 Hemiplegia and hemiparesis following unspecified cerebrovascular disease affecting unspecified side: Secondary | ICD-10-CM

## 2012-09-16 DIAGNOSIS — T40601A Poisoning by unspecified narcotics, accidental (unintentional), initial encounter: Secondary | ICD-10-CM

## 2012-09-16 DIAGNOSIS — F329 Major depressive disorder, single episode, unspecified: Secondary | ICD-10-CM | POA: Diagnosis present

## 2012-09-16 DIAGNOSIS — Z79899 Other long term (current) drug therapy: Secondary | ICD-10-CM

## 2012-09-16 DIAGNOSIS — J9600 Acute respiratory failure, unspecified whether with hypoxia or hypercapnia: Secondary | ICD-10-CM | POA: Diagnosis present

## 2012-09-16 DIAGNOSIS — D696 Thrombocytopenia, unspecified: Secondary | ICD-10-CM

## 2012-09-16 DIAGNOSIS — Z7982 Long term (current) use of aspirin: Secondary | ICD-10-CM

## 2012-09-16 DIAGNOSIS — I9589 Other hypotension: Secondary | ICD-10-CM | POA: Diagnosis present

## 2012-09-16 DIAGNOSIS — G8929 Other chronic pain: Secondary | ICD-10-CM | POA: Diagnosis present

## 2012-09-16 DIAGNOSIS — E039 Hypothyroidism, unspecified: Secondary | ICD-10-CM | POA: Diagnosis present

## 2012-09-16 DIAGNOSIS — E870 Hyperosmolality and hypernatremia: Secondary | ICD-10-CM | POA: Diagnosis not present

## 2012-09-16 DIAGNOSIS — T48201A Poisoning by unspecified drugs acting on muscles, accidental (unintentional), initial encounter: Secondary | ICD-10-CM | POA: Diagnosis present

## 2012-09-16 DIAGNOSIS — T400X1A Poisoning by opium, accidental (unintentional), initial encounter: Secondary | ICD-10-CM | POA: Diagnosis present

## 2012-09-16 DIAGNOSIS — E876 Hypokalemia: Secondary | ICD-10-CM | POA: Diagnosis not present

## 2012-09-16 DIAGNOSIS — T481X4A Poisoning by skeletal muscle relaxants [neuromuscular blocking agents], undetermined, initial encounter: Principal | ICD-10-CM | POA: Diagnosis present

## 2012-09-16 DIAGNOSIS — I10 Essential (primary) hypertension: Secondary | ICD-10-CM | POA: Diagnosis present

## 2012-09-16 DIAGNOSIS — A498 Other bacterial infections of unspecified site: Secondary | ICD-10-CM | POA: Diagnosis not present

## 2012-09-16 DIAGNOSIS — E785 Hyperlipidemia, unspecified: Secondary | ICD-10-CM | POA: Diagnosis present

## 2012-09-16 DIAGNOSIS — G894 Chronic pain syndrome: Secondary | ICD-10-CM

## 2012-09-16 DIAGNOSIS — N179 Acute kidney failure, unspecified: Secondary | ICD-10-CM | POA: Diagnosis present

## 2012-09-16 DIAGNOSIS — E873 Alkalosis: Secondary | ICD-10-CM | POA: Diagnosis not present

## 2012-09-16 DIAGNOSIS — E119 Type 2 diabetes mellitus without complications: Secondary | ICD-10-CM | POA: Diagnosis present

## 2012-09-16 DIAGNOSIS — G4733 Obstructive sleep apnea (adult) (pediatric): Secondary | ICD-10-CM | POA: Diagnosis present

## 2012-09-16 HISTORY — DX: Unspecified osteoarthritis, unspecified site: M19.90

## 2012-09-16 LAB — POCT I-STAT, CHEM 8
Calcium, Ion: 1.14 mmol/L (ref 1.12–1.23)
Glucose, Bld: 115 mg/dL — ABNORMAL HIGH (ref 70–99)
HCT: 41 % (ref 36.0–46.0)
Hemoglobin: 13.9 g/dL (ref 12.0–15.0)
Potassium: 4.1 mEq/L (ref 3.5–5.1)
TCO2: 25 mmol/L (ref 0–100)

## 2012-09-16 LAB — POCT I-STAT 3, ART BLOOD GAS (G3+)
Acid-base deficit: 4 mmol/L — ABNORMAL HIGH (ref 0.0–2.0)
Acid-base deficit: 3 mmol/L — ABNORMAL HIGH (ref 0.0–2.0)
Bicarbonate: 23.9 mEq/L (ref 20.0–24.0)
O2 Saturation: 100 %
O2 Saturation: 95 %
Patient temperature: 35.9
TCO2: 28 mmol/L (ref 0–100)
TCO2: 25 mmol/L (ref 0–100)
pH, Arterial: 7.289 — ABNORMAL LOW (ref 7.350–7.450)

## 2012-09-16 LAB — RAPID URINE DRUG SCREEN, HOSP PERFORMED
Amphetamines: POSITIVE — AB
Benzodiazepines: NOT DETECTED
Cocaine: NOT DETECTED
Opiates: POSITIVE — AB

## 2012-09-16 LAB — CBC WITH DIFFERENTIAL/PLATELET
Eosinophils Absolute: 0.1 10*3/uL (ref 0.0–0.7)
Eosinophils Relative: 1 % (ref 0–5)
Lymphs Abs: 2.6 10*3/uL (ref 0.7–4.0)
MCH: 30.1 pg (ref 26.0–34.0)
MCV: 96.2 fL (ref 78.0–100.0)
Platelets: 143 10*3/uL — ABNORMAL LOW (ref 150–400)
RBC: 4.25 MIL/uL (ref 3.87–5.11)

## 2012-09-16 LAB — BASIC METABOLIC PANEL
BUN: 15 mg/dL (ref 6–23)
Chloride: 112 mEq/L (ref 96–112)
GFR calc Af Amer: 81 mL/min — ABNORMAL LOW (ref >90)
GFR calc non Af Amer: 70 mL/min — ABNORMAL LOW (ref >90)
Potassium: 3.7 mEq/L (ref 3.5–5.1)
Sodium: 146 mEq/L — ABNORMAL HIGH (ref 135–145)

## 2012-09-16 LAB — GLUCOSE, CAPILLARY
Glucose-Capillary: 90 mg/dL (ref 70–99)
Glucose-Capillary: 77 mg/dL (ref 70–99)

## 2012-09-16 LAB — URINALYSIS, ROUTINE W REFLEX MICROSCOPIC
Ketones, ur: NEGATIVE mg/dL
Nitrite: NEGATIVE
Protein, ur: >300 mg/dL — AB
Urobilinogen, UA: 1 mg/dL (ref 0.0–1.0)

## 2012-09-16 LAB — CBC
HCT: 38.4 % (ref 36.0–46.0)
Hemoglobin: 11.9 g/dL — ABNORMAL LOW (ref 12.0–15.0)
MCH: 30.2 pg (ref 26.0–34.0)
MCHC: 31 g/dL (ref 30.0–36.0)
MCV: 97.5 fL (ref 78.0–100.0)
RDW: 14.8 % (ref 11.5–15.5)

## 2012-09-16 LAB — CK: Total CK: 834 U/L — ABNORMAL HIGH (ref 7–177)

## 2012-09-16 LAB — URINE MICROSCOPIC-ADD ON

## 2012-09-16 LAB — CREATININE, SERUM: GFR calc non Af Amer: 55 mL/min — ABNORMAL LOW (ref >90)

## 2012-09-16 MED ORDER — ENOXAPARIN SODIUM 40 MG/0.4ML ~~LOC~~ SOLN
40.0000 mg | SUBCUTANEOUS | Status: DC
  Administered 2012-09-16 – 2012-09-19 (×4): 40 mg via SUBCUTANEOUS
  Filled 2012-09-16 (×5): qty 0.4

## 2012-09-16 MED ORDER — LEVETIRACETAM 500 MG PO TABS
2000.0000 mg | ORAL_TABLET | Freq: Two times a day (BID) | ORAL | Status: DC
  Administered 2012-09-16: 2000 mg via ORAL
  Filled 2012-09-16 (×2): qty 1

## 2012-09-16 MED ORDER — ASPIRIN 300 MG RE SUPP
300.0000 mg | RECTAL | Status: AC
  Administered 2012-09-16: 300 mg via RECTAL
  Filled 2012-09-16: qty 1

## 2012-09-16 MED ORDER — FENTANYL CITRATE 0.05 MG/ML IJ SOLN
INTRAMUSCULAR | Status: AC
  Filled 2012-09-16: qty 2

## 2012-09-16 MED ORDER — MIDAZOLAM HCL 2 MG/2ML IJ SOLN
2.0000 mg | Freq: Once | INTRAMUSCULAR | Status: AC
  Administered 2012-09-16: 2 mg via INTRAVENOUS

## 2012-09-16 MED ORDER — SUCCINYLCHOLINE CHLORIDE 20 MG/ML IJ SOLN
INTRAMUSCULAR | Status: AC
  Filled 2012-09-16: qty 1

## 2012-09-16 MED ORDER — FENTANYL CITRATE 0.05 MG/ML IJ SOLN: 50.0000 ug | Freq: Once | INTRAMUSCULAR | Status: AC

## 2012-09-16 MED ORDER — VALPROATE SODIUM 500 MG/5ML IV SOLN
500.0000 mg | Freq: Two times a day (BID) | INTRAVENOUS | Status: DC
  Administered 2012-09-16 – 2012-09-17 (×3): 500 mg via INTRAVENOUS
  Filled 2012-09-16 (×4): qty 5

## 2012-09-16 MED ORDER — ETOMIDATE 2 MG/ML IV SOLN
INTRAVENOUS | Status: AC
Start: 2012-09-16 — End: 2012-09-16
  Administered 2012-09-16: 20 mg via INTRAVENOUS
  Filled 2012-09-16: qty 20

## 2012-09-16 MED ORDER — PHENYLEPHRINE HCL 10 MG/ML IJ SOLN
30.0000 ug/min | INTRAVENOUS | Status: DC
  Administered 2012-09-16: 30 ug/min via INTRAVENOUS
  Filled 2012-09-16: qty 1

## 2012-09-16 MED ORDER — DIVALPROEX SODIUM 500 MG PO DR TAB
500.0000 mg | DELAYED_RELEASE_TABLET | Freq: Two times a day (BID) | ORAL | Status: DC
  Filled 2012-09-16 (×2): qty 1

## 2012-09-16 MED ORDER — BACLOFEN 40 MG/20ML IT SOLN
40.0000 mg | INTRATHECAL | Status: DC
  Administered 2012-09-16: 40 mg via INTRATHECAL

## 2012-09-16 MED ORDER — NALOXONE HCL 1 MG/ML IJ SOLN
1.6700 mg/h | INTRAVENOUS | Status: DC
  Filled 2012-09-16: qty 4

## 2012-09-16 MED ORDER — NALOXONE HCL 0.4 MG/ML IJ SOLN
INTRAMUSCULAR | Status: AC
  Administered 2012-09-16: 0.4 mg
  Filled 2012-09-16: qty 1

## 2012-09-16 MED ORDER — FENTANYL CITRATE 0.05 MG/ML IJ SOLN
50.0000 ug | Freq: Once | INTRAMUSCULAR | Status: AC
  Administered 2012-09-16: 50 ug via INTRAVENOUS

## 2012-09-16 MED ORDER — SODIUM CHLORIDE 0.9 % IV SOLN: 250.0000 mL | INTRAVENOUS | Status: DC | PRN

## 2012-09-16 MED ORDER — ROCURONIUM BROMIDE 50 MG/5ML IV SOLN
INTRAVENOUS | Status: AC
Start: 2012-09-16 — End: 2012-09-16
  Filled 2012-09-16: qty 2

## 2012-09-16 MED ORDER — PANTOPRAZOLE SODIUM 40 MG IV SOLR
40.0000 mg | Freq: Every day | INTRAVENOUS | Status: DC
  Administered 2012-09-16: 40 mg via INTRAVENOUS
  Filled 2012-09-16 (×2): qty 40

## 2012-09-16 MED ORDER — MIDAZOLAM HCL 2 MG/2ML IJ SOLN
INTRAMUSCULAR | Status: AC
  Filled 2012-09-16: qty 2

## 2012-09-16 MED ORDER — PRESCRIPTION MEDICATION: Status: DC

## 2012-09-16 MED ORDER — LIDOCAINE HCL (CARDIAC) 20 MG/ML IV SOLN
INTRAVENOUS | Status: AC
  Filled 2012-09-16: qty 5

## 2012-09-16 MED ORDER — NALOXONE HCL 1 MG/ML IJ SOLN: 2.0000 mg | Freq: Once | INTRAMUSCULAR | Status: DC

## 2012-09-16 MED ORDER — SODIUM CHLORIDE 0.9 % IV SOLN
2000.0000 mg | Freq: Two times a day (BID) | INTRAVENOUS | Status: DC
  Administered 2012-09-16 – 2012-09-17 (×3): 2000 mg via INTRAVENOUS
  Filled 2012-09-16 (×4): qty 20

## 2012-09-16 MED ORDER — FENTANYL CITRATE 0.05 MG/ML IJ SOLN
INTRAMUSCULAR | Status: AC
  Administered 2012-09-16: 50 ug via INTRAVENOUS
  Filled 2012-09-16: qty 2

## 2012-09-16 MED ORDER — ASPIRIN 81 MG PO CHEW: 324.0000 mg | CHEWABLE_TABLET | ORAL | Status: AC

## 2012-09-16 MED ORDER — SODIUM CHLORIDE 0.9 % IV BOLUS (SEPSIS)
1000.0000 mL | Freq: Once | INTRAVENOUS | Status: AC
  Administered 2012-09-16: 1000 mL via INTRAVENOUS

## 2012-09-16 NOTE — ED Notes (Addendum)
[employer] representative paged to stop the pt's Baclofen pump. [employer] phone number: [phone]

## 2012-09-16 NOTE — Progress Notes (Addendum)
INITIAL NUTRITION ASSESSMENT  DOCUMENTATION CODES Per approved criteria  -Obesity Unspecified   INTERVENTION:  If unable to extubate within the next 24 hours, recommend initiate TF via OGT with Jevity 1.2 at 15 ml/h, increase by 10 ml every 4 hours to goal rate of 25 ml/h with Prostat 30 ml 5 times daily to provide 1220 kcals (24 kcals/kg ideal weight), 108 gm protein, 486 ml free water daily.  NUTRITION DIAGNOSIS: Inadequate oral intake related to inability to eat as evidenced by NPO status.   Goal: Intake to provide 60-70% of estimated calorie needs (22-25 kcals/kg ideal body weight) and 100% of estimated protein needs, based on ASPEN guidelines for permissive underfeeding in critically ill obese individuals.  Monitor:  TF tolerance/adequacy, weight trend, labs, vent status.  Reason for Assessment: VDRF  57 y.o. female  Admitting Dx: Acute respiratory failure  ASSESSMENT: Patient was admitted with respiratory failure, likely baclofen/morphine overdose. Intubated in the ED. Plans to extubate once mental status improves. No plans to start TF today, abdomen is distended.   Patient is currently intubated on ventilator support.  MV: 11.9  Temp:Temp (24hrs), Avg:97.7 F (36.5 C), Min:97 F (36.1 C), Max:99.5 F (37.5 C)    Height: Ht Readings from Last 1 Encounters:  09/16/12 5\' 2"  (1.575 m)    Weight: 93 kg (204.6 lb) per RN  Ideal Body Weight: 50 kg  % Ideal Body Weight: 186%  Wt Readings from Last 10 Encounters:  08/09/12 220 lb 7.4 oz (100 kg)  07/29/12 242 lb 1 oz (109.8 kg)  03/05/11 235 lb (106.595 kg)    Usual Body Weight: 220 lb (1 month ago)  % Usual Body Weight: 93%  BMI:  37.5 (class 2 obesity)  Estimated Nutritional Needs: Kcal: 1840 Protein: 100-115 gm Fluid: 1.8-1.9 L  Skin: no issues  Diet Order:  NPO  EDUCATION NEEDS: -Education not appropriate at this time   Intake/Output Summary (Last 24 hours) at 09/16/12 1443 Last data filed  at 09/16/12 1300  Gross per 24 hour  Intake  50.25 ml  Output    255 ml  Net -204.75 ml    Last BM: none documented   Labs:   Recent Labs Lab 09/16/12 0315 09/16/12 0900  NA 145  --   K 4.1  --   CL 110  --   BUN 17  --   CREATININE 1.30* 1.10  GLUCOSE 115*  --     CBG (last 3)   Recent Labs  09/16/12 0828 09/16/12 1122  GLUCAP 76 90    Scheduled Meds: . enoxaparin (LOVENOX) injection  40 mg Subcutaneous Q24H  . levETIRAcetam  2,000 mg Intravenous Q12H  . lidocaine (cardiac) 100 mg/47ml      . pantoprazole (PROTONIX) IV  40 mg Intravenous QHS  . rocuronium      . succinylcholine      . valproate sodium  500 mg Intravenous Q12H    Continuous Infusions: . baclofen    . phenylephrine (NEO-SYNEPHRINE) Adult infusion Stopped (09/16/12 1158)    Past Medical History  Diagnosis Date  . Hypertension   . Seizure disorder   . Metabolic encephalopathy 05/06/2010  . Respiratory failure 05/06/10  . UTI (lower urinary tract infection) 05/06/10  . 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 Sleep apnea 04/2010    on CPAP, "severe central sleep apnea"  . 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 Chronic pain   . Depression   . Thrombocytopenia  related to depakote  . Spasticity     chronic  . Diabetes mellitus without complication   . Thrombocytopenia, unspecified 07/28/2012  . Acute respiratory failure 07/27/2012  . Narcotic overdose 07/27/2012  . 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 Unspecified hypothyroidism   . Sleep apnea     Past Surgical History  Procedure Laterality Date  . Intrathecal pump implantation  2010    [employer]:  fentanyl, baclofen changed to morphine and baclofen on 07/2012  . Cerebral aneurysm repair  1999     Joaquin Courts, RD, LDN, CNSC Pager [phone] After Hours Pager [phone]

## 2012-09-16 NOTE — Progress Notes (Signed)
PULMONARY  / CRITICAL CARE MEDICINE  Name: [name] MRN: [id number] DOB: September 27, 1955    ADMISSION DATE:  09/16/2012 CONSULTATION DATE:  09/16/2012  REFERRING MD :  ED PRIMARY SERVICE: CCM  CHIEF COMPLAINT:  Respiratory failure  BRIEF PATIENT DESCRIPTION: 57 yo F with likely baclofen/morphine overdose  SIGNIFICANT EVENTS / STUDIES:  7/11 >>> admit, intubated in ED with respiratory failure  LINES / TUBES: ETT 7/11 >>> PIV  CULTURES: 7/11 Blood >>> 7/11 Urine >>>  ANTIBIOTICS: None  HISTORY OF PRESENT ILLNESS:  [name] is a 57 y.o. female NH resident who is on a chronic infusion of baclofen and morphine via an implanted intraabdominal pump. Yesterday pt was seen by her neurologist and the morphine was reportedly taken out of her pump. This morning she was found at her NH unresponsive with a respiratory rate of around 6 so EMS was called. En route she responded briefly to 2mg  IV narcan. In the ER she was given additional dose of Narcan and became hypotensive with systolics down to the 50s. She was eventually intubated due to respiratory depression. The [employer] device representative was called in to disable to the baclofen pump.   Per [employer], pt was getting 436mcg/day of baclofen prior to turning device down in ED. Device was turned down to 68mcg/day by representative, which is the lowest possible dose in order to salvage the device. On call device representative phone # is [phone].  FAMILY HISTORY:  History reviewed. No pertinent family history. SOCIAL HISTORY:  reports that she has never smoked. She does not have any smokeless tobacco history on file. She reports that she does not drink alcohol or use illicit drugs.  REVIEW OF SYSTEMS:  Intubated on vent, unable to obtain  SUBJECTIVE: intubated and sedate  VITAL SIGNS: Temp:  [97 F (36.1 C)-98.1 F (36.7 C)] 97 F (36.1 C) (07/11 0700) Pulse Rate:  [81-101] 85 (07/11 0818) Resp:   [6-24] 24 (07/11 0818) BP: (58-114)/(31-76) 104/76 mmHg (07/11 0818) SpO2:  [86 %-100 %] 98 % (07/11 0818) FiO2 (%):  [50 %-100 %] 50 % (07/11 0818) HEMODYNAMICS:   VENTILATOR SETTINGS: Vent Mode:  [-] PRVC FiO2 (%):  [50 %-100 %] 50 % Set Rate:  [16 bmp-24 bmp] 24 bmp Vt Set:  [400 mL] 400 mL PEEP:  [5 cmH20] 5 cmH20 Plateau Pressure:  [23 cmH20-29 cmH20] 23 cmH20 INTAKE / OUTPUT: Intake/Output   None    PHYSICAL EXAMINATION: General:  NAD, intubated Neuro:  R pupil reactive, L pupil sluggish. Opens eyes to voice. HEENT:  NCAT, ETT in place Cardiovascular:  RRR. 2+ pulses in all extremities. Lungs:  Coarse breath sounds bilaterally Abdomen:  +BS. Distended but nontender to palpation Ext:  Extremities atraumatic. S/p amputation of left fifth toe. Bilateral feet are chronically plantarflexed. Skin:  No rashes noted.      LABS:  Recent Labs Lab 09/16/12 0300 09/16/12 0315 09/16/12 0608 09/16/12 0737  HGB 12.8 13.9  --   --   WBC 8.0  --   --   --   PLT 143*  --   --   --   NA  --  145  --   --   K  --  4.1  --   --   CL  --  110  --   --   GLUCOSE  --  115*  --   --   BUN  --  17  --   --   CREATININE  --  1.30*  --   --   PHART  --   --  7.183* 7.289*  PCO2ART  --   --  68.3* 49.1*  PO2ART  --   --  221.0* 81.0   Recent Labs Lab 09/16/12 0828  GLUCAP 76   CXR: appropriate placement of ET tube and OG tube, no focal infiltrates  ASSESSMENT / PLAN:  NEUROLOGIC A:  Likely baclofen/morphine overdose. Indwelling baclofen pump. 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 History of seizures. P:   - Baclofen pump running at rate of 12 mcg/day currently (lowest amount allowed to still salvage device). Need to monitor closely for baclofen withdrawal, which can be life threatening (rhabdo). Will need to contact [employer] rep today to increase dose back up this afternoon to 200 mcg/day. - Continue narcan drip - Repeat BMET/CK later  7/11  PULMONARY A: Hypercarbic respiratory failure likely due to morphine/baclofen OD.  Hx sleep apnea. P:   - Full support with vent now, extubate once more awake. - Wean vent as tolerated. - CPAP once comes off vent. - Increase RR to 30 to address acidosis.  CARDIOVASCULAR A: Chronic hypertension, currently hypotensive likely due to baclofen overdose and narcotic overdose. P:  - Start phenylephrine. - IVF.  RENAL A:  Acute Kidney Injury (cr 1.3, was 0.49 one month ago) P:   - KVO IVF. - Trend BMET. - CK and BMET this PM (due to risk of rhabdo with baclofen withdrawal).  GASTROINTESTINAL A:  Distended abdomen P:   - Serial abdominal exams, consider imaging if appears to be in pain.Marland Kitchen  HEMATOLOGIC A:  Chronic thrombocytopenia P:  - Trend CBC. - SCD's for DVT prevention.  INFECTIOUS A:  No signs of obvious infection. P:   - Trend WBC and fever curve.  ENDOCRINE A:  Diabetes mellitus.  Hypothyroidism. P:   - Restart synthroid once taking PO. - Sliding scale insulin.  TODAY'S SUMMARY: admit, vent support, wean as able, call [employer] for increase baclofen today.  I have personally obtained a history, examined the patient, evaluated laboratory and imaging results, formulated the assessment and plan and placed orders.  CRITICAL CARE: The patient is critically ill with multiple organ systems failure and requires high complexity decision making for assessment and support, frequent evaluation and titration of therapies, application of advanced monitoring technologies and extensive interpretation of multiple databases. Critical Care Time devoted to patient care services described in this note is 35 minutes.   YACOUB,WESAM,MD Pulmonary and Critical Care Medicine [facility] Pager: [phone]  09/16/2012, 9:37 AM

## 2012-09-16 NOTE — ED Notes (Signed)
[employer] representative responded to page, and is on the way to the department to stop the pt's Baclofen pump.

## 2012-09-16 NOTE — ED Notes (Signed)
This RN called the nurse at the pt's nursing home and was told that the pt's records would be faxed over to the hospital.

## 2012-09-16 NOTE — ED Notes (Signed)
Pt remains sedated on ventilator. Vital signs stable. Family at bedside. Waiting on bed assignment. CCM at bedside.

## 2012-09-16 NOTE — ED Provider Notes (Signed)
History    CSN: [id number] Arrival date & time 09/16/12  0250  First MD Initiated Contact with Patient 09/16/12 0257     Chief Complaint  Patient presents with  . Drug Overdose   (Consider location/radiation/quality/duration/timing/severity/associated sxs/prior Treatment) HPI Level 5 Caveat: altered mental status. This is a 57 year old female nursing home resident. 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 She has an implanted drug pump in her left lower abdomen which infuses baclofen and morphine to treat chronic pain and spasticity of her left side. Her daughter states that her neurologist removed the baclofen and morphine mixture from the pump yesterday and replaced it with a baclofen solution only. The patient was found to be unresponsive this morning by nursing home staff. EMS was consulted and they found her to be near apneic with respirations of 6 per minute. They also noted to have pinpoint pupils. They administered 2 mg of Narcan IV with return of consciousness. This was transient and on arrival she is no longer awake or able to converse though she has not near apneic at this time.  She has had to be placed on a Narcan drip for morphine overdose in the past.  Past Medical History  Diagnosis Date  . Hypertension   . Seizure disorder   . Metabolic encephalopathy 05/06/2010  . Respiratory failure 05/06/10  . UTI (lower urinary tract infection) 05/06/10  . 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 Sleep apnea 04/2010    on CPAP, "severe central sleep apnea"  . 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 Chronic pain   . Depression   . 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 Spasticity     chronic  . Diabetes mellitus without complication   . Thrombocytopenia, unspecified 07/28/2012  . Acute respiratory failure 07/27/2012  . Narcotic overdose 07/27/2012  . 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 Unspecified  hypothyroidism   . Sleep apnea    Past Surgical History  Procedure Laterality Date  . Intrathecal pump implantation  2010    [employer]:  fentanyl, baclofen changed to morphine and baclofen on 07/2012  . Cerebral aneurysm repair  1999   History reviewed. No pertinent family history. History  Substance Use Topics  . Smoking status: Never Smoker   . Smokeless tobacco: Not on file  . Alcohol Use: No   OB History   Grav Para Term Preterm Abortions TAB SAB Ect Mult Living                 Review of Systems  Unable to perform ROS   Allergies  Codeine  Home Medications   Current Outpatient Rx  Name  Route  Sig  Dispense  Refill  . amLODipine (NORVASC) 5 MG tablet   Oral   Take 5 mg by mouth daily.         Marland Kitchen aspirin EC 81 MG tablet   Oral   Take 81 mg by mouth daily.         . benzocaine (ANBESOL) 10 % mucosal gel   Mouth/Throat   Use as directed 1 application in the mouth or throat as needed for pain (for tooth pain).         . Cholecalciferol (VITAMIN D-3) 1000 UNITS CAPS   Oral   Take 3 capsules by mouth daily.         . cloNIDine (CATAPRES - DOSED IN  MG/24 HR) 0.1 mg/24hr patch   Transdermal   Place 1 patch (0.1 mg total) onto the skin once a week.   4 patch   12   . divalproex (DEPAKOTE) 500 MG DR tablet   Oral   Take 500 mg by mouth 2 (two) times daily.         . DULoxetine (CYMBALTA) 60 MG capsule   Oral   Take 60 mg by mouth daily.         . hydrOXYzine (ATARAX/VISTARIL) 25 MG tablet   Oral   Take 12.5 mg by mouth daily as needed for itching or anxiety.         Marland Kitchen ipratropium-albuterol (DUONEB) 0.5-2.5 (3) MG/3ML SOLN   Nebulization   Take 3 mLs by nebulization every 6 (six) hours as needed (for shortness of breath/wheezing).         . labetalol (NORMODYNE) 200 MG tablet   Oral   Take 200 mg by mouth 2 (two) times daily.         Marland Kitchen levETIRAcetam (KEPPRA) 1000 MG tablet   Oral   Take 2,000 mg by mouth 2 (two) times daily.          Marland Kitchen levothyroxine (SYNTHROID, LEVOTHROID) 137 MCG tablet   Oral   Take 0.5 tablets (68.5 mcg total) by mouth daily before breakfast.         . losartan (COZAAR) 50 MG tablet   Oral   Take 1 tablet (50 mg total) by mouth at bedtime.         Marland Kitchen olopatadine (PATANOL) 0.1 % ophthalmic solution   Both Eyes   Place 1 drop into both eyes 2 (two) times daily.         Marland Kitchen omeprazole (PRILOSEC) 20 MG capsule   Oral   Take 20 mg by mouth daily.         . polyethylene glycol (MIRALAX / GLYCOLAX) packet   Oral   Take 17 g by mouth daily.         Marland Kitchen PRESCRIPTION MEDICATION   Intrathecal   1 Device by Intrathecal route continuous. Per nursing home [facility], patient had Baclofen and Morphine in her pain pump.         . simvastatin (ZOCOR) 20 MG tablet   Oral   Take 20 mg by mouth at bedtime.          BP 58/31  Pulse 92  Resp 6  SpO2 98%  Physical Exam General: Well-developed, well-nourished female in no acute distress; appearance consistent with age of record HENT: normocephalic, atraumatic Eyes: pupils equal round and reactive to light; disconjugate gaze Neck: supple Heart: regular rate and rhythm Lungs: clear to auscultation bilaterally Abdomen: soft; nondistended; bowel sounds present Extremities: Contractures of left upper lower extremities Neurologic: Somnolent; left hemiparesis Skin: Warm and dry    ED Course  Procedures (including critical care time)  CRITICAL CARE Performed by: Jairo Bellew L Total critical care time: 45 minutes Critical care time was exclusive of separately billable procedures and treating other patients. Critical care was necessary to treat or prevent imminent or life-threatening deterioration. Critical care was time spent personally by me on the following activities: development of treatment plan with patient and/or surrogate as well as nursing, discussions with consultants, evaluation of patient's response to treatment, examination of  patient, obtaining history from patient or surrogate, ordering and performing treatments and interventions, ordering and review of laboratory studies, ordering and review of radiographic studies, pulse oximetry and re-evaluation  of patient's condition.   INTUBATION Performed by: Ahmaad Neidhardt L  Required items: required blood products, implants, devices, and special equipment available Patient identity confirmed: provided demographic data and hospital-assigned identification number Time out: Immediately prior to procedure a "time out" was called to verify the correct patient, procedure, equipment, support staff and site/side marked as required.  Indications: Altered mental status; respiratory depression   Intubation method: Glidescope Laryngoscopy   Preoxygenation: BVM  Sedatives: Etomidate Paralytic: None   Tube Size: 7.5 cuffed  Post-procedure assessment: chest rise and ETCO2 monitor Breath sounds: equal and absent over the epigastrium Tube secured with: ETT holder Chest x-ray interpreted by radiologist and me.  Chest x-ray findings: endotracheal tube in appropriate position  Patient tolerated the procedure well with no immediate complications.     MDM   Nursing notes and vitals signs, including pulse oximetry, reviewed.  Summary of this visit's results, reviewed by myself:  Labs:  Results for orders placed during the hospital encounter of 09/16/12 (from the past 24 hour(s))  CBC WITH DIFFERENTIAL     Status: Abnormal   Collection Time    09/16/12  3:00 AM      Result Value Range   WBC 8.0  4.0 - 10.5 K/uL   RBC 4.25  3.87 - 5.11 MIL/uL   Hemoglobin 12.8  12.0 - 15.0 g/dL   HCT 96.0  45.4 - 09.8 %   MCV 96.2  78.0 - 100.0 fL   MCH 30.1  26.0 - 34.0 pg   MCHC 31.3  30.0 - 36.0 g/dL   RDW 11.9  14.7 - 82.9 %   Platelets 143 (*) 150 - 400 K/uL   Neutrophils Relative % 56  43 - 77 %   Neutro Abs 4.5  1.7 - 7.7 K/uL   Lymphocytes Relative 32  12 - 46 %   Lymphs Abs  2.6  0.7 - 4.0 K/uL   Monocytes Relative 12  3 - 12 %   Monocytes Absolute 0.9  0.1 - 1.0 K/uL   Eosinophils Relative 1  0 - 5 %   Eosinophils Absolute 0.1  0.0 - 0.7 K/uL   Basophils Relative 0  0 - 1 %   Basophils Absolute 0.0  0.0 - 0.1 K/uL  VALPROIC ACID LEVEL     Status: None   Collection Time    09/16/12  3:00 AM      Result Value Range   Valproic Acid Lvl 53.2  50.0 - 100.0 ug/mL  POCT I-STAT, CHEM 8     Status: Abnormal   Collection Time    09/16/12  3:15 AM      Result Value Range   Sodium 145  135 - 145 mEq/L   Potassium 4.1  3.5 - 5.1 mEq/L   Chloride 110  96 - 112 mEq/L   BUN 17  6 - 23 mg/dL   Creatinine, Ser 5.62 (*) 0.50 - 1.10 mg/dL   Glucose, Bld 130 (*) 70 - 99 mg/dL   Calcium, Ion 8.65  7.84 - 1.23 mmol/L   TCO2 25  0 - 100 mmol/L   Hemoglobin 13.9  12.0 - 15.0 g/dL   HCT 69.6  29.5 - 28.4 %    Imaging Studies: Ct Head Wo Contrast  09/16/2012   *RADIOLOGY REPORT*  Clinical Data: Drug overdose.  Altered mental status.  CT HEAD WITHOUT CONTRAST  Technique:  Contiguous axial images were obtained from the base of the skull through the vertex without contrast.  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  Low attenuation change along the right frontal parietal and temporal deep white matter consistent with old infarct.  Old lacunar infarcts in the left basal ganglia.  Old infarct and calcification in the deep white matter in the left anterior frontal region.  Ventricular dilatation likely related to contraction of the encephalomalacia. No acute mass effect or midline shift.  No abnormal extra-axial fluid collections.  Gray-white matter junctions are distinct. Basal cisterns are not effaced.  No evidence of acute intracranial hemorrhage.  No depressed skull fractures.  Visualized paranasal sinuses and mastoid air cells are not opacified.  No significant changes since the previous study.  IMPRESSION: No evidence of acute intracranial  abnormality.  Chronic changes including aneurysm clip and old bilateral deep white matter infarcts.   Original Report Authenticated By: Burman Nieves, M.D.      3:20 AM No change in mental status with 2 mg of IV Narcan. Patient noted to be hypotensive.  4:42 AM Patient's blood pressure now in the 50s despite IV fluid bolus. No change in mental status. Second normal saline bolus initiated. Patient not febrile. Respiratory rate 6 but with good oxygen saturation.  5:00 AM Patient intubated due to depressed respirations. Blood pressure is now within normal limits. [employer] consulted to discontinue the patient's implanted baclofen pump.   6:53 AM Patient has been admitted to the critical care service. The [employer] technician who is consulted to turn the patient's wound breakdown advises that an entire day's worth of drugs can accumulate in the pulps catheter. The patient's symptoms are likely due to an unintentional baclofen overdose following medication change yesterday.  Hanley Seamen, MD 09/16/12 [phone]

## 2012-09-16 NOTE — Progress Notes (Signed)
Interim Progress Note:  [name], [employer] representative arrived on 2100 unit to assist with changing baclofen dose. I authorized continuous dose of 200 mcg/day, which is half of pt's home dose of 400 mcg/day. This should now be the administered amount. Documentation strip from interrogation of pump printed off and put in pt's chart.  Levert Feinstein, MD Resident PGY-2

## 2012-09-16 NOTE — Progress Notes (Signed)
UR Completed.  Amilyah Nack Jane [phone] 09/16/2012  

## 2012-09-16 NOTE — H&P (Signed)
PULMONARY  / CRITICAL CARE MEDICINE  Name: [name] MRN: [id number] DOB: [birth year]    ADMISSION DATE:  09/16/2012 CONSULTATION DATE:  09/16/2012  REFERRING MD :  ED PRIMARY SERVICE: CCM  CHIEF COMPLAINT:  Respiratory failure  BRIEF PATIENT DESCRIPTION: 57 yo F with likely baclofen/morphine overdose  SIGNIFICANT EVENTS / STUDIES:  7/11 >>> admit, intubated in ED with respiratory failure  LINES / TUBES: ETT 7/11 >>>  CULTURES: 7/11 Blood >>> 7/11 Urine >>>  ANTIBIOTICS: none  HISTORY OF PRESENT ILLNESS:  [name] is a 57 y.o. female NH resident who is on a chronic infusion of baclofen and morphine via an implanted intraabdominal pump. Yesterday pt was seen by her neurologist and the morphine was reportedly taken out of her pump. This morning she was found at her NH unresponsive with a respiratory rate of around 6 so EMS was called. En route she responded briefly to 2mg  IV narcan. In the ER she was given additional dose of Narcan and became hypotensive with systolics down to the 50s. She was eventually intubated due to respiratory depression. The [employer] device representative was called in to disable to the baclofen pump.   Per [employer], pt was getting 46mcg/day of baclofen prior to turning device down in ED. Device was turned down to 10mcg/day by representative, which is the lowest possible dose in order to salvage the device. On call device representative phone # is [phone].  PAST MEDICAL HISTORY :  Past Medical History  Diagnosis Date  . Hypertension   . Seizure disorder   . Metabolic encephalopathy 05/06/2010  . Respiratory failure 05/06/10  . UTI (lower urinary tract infection) 05/06/10  . 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 Sleep apnea 04/2010    on CPAP, "severe central sleep apnea"  . 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 Chronic pain   . Depression   . 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  Spasticity     chronic  . Diabetes mellitus without complication   . Thrombocytopenia, unspecified 07/28/2012  . Acute respiratory failure 07/27/2012  . Narcotic overdose 07/27/2012  . 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 Unspecified hypothyroidism   . Sleep apnea    Past Surgical History  Procedure Laterality Date  . Intrathecal pump implantation  2010    [employer]:  fentanyl, baclofen changed to morphine and baclofen on 07/2012  . Cerebral aneurysm repair  1999   Prior to Admission medications   Medication Sig Start Date End Date Taking? Authorizing Provider  amLODipine (NORVASC) 5 MG tablet Take 5 mg by mouth daily.    Historical Provider, MD  aspirin EC 81 MG tablet Take 81 mg by mouth daily.    Historical Provider, MD  benzocaine (ANBESOL) 10 % mucosal gel Use as directed 1 application in the mouth or throat as needed for pain (for tooth pain).    Historical Provider, MD  Cholecalciferol (VITAMIN D-3) 1000 UNITS CAPS Take 3 capsules by mouth daily.    Historical Provider, MD  cloNIDine (CATAPRES - DOSED IN MG/24 HR) 0.1 mg/24hr patch Place 1 patch (0.1 mg total) onto the skin once a week. 08/09/12   Jeanella Craze, NP  divalproex (DEPAKOTE) 500 MG DR tablet Take 500 mg by mouth 2 (two) times daily.    Historical Provider, MD  DULoxetine (CYMBALTA) 60 MG capsule Take 60 mg by mouth daily.    Historical  Provider, MD  hydrOXYzine (ATARAX/VISTARIL) 25 MG tablet Take 12.5 mg by mouth daily as needed for itching or anxiety.    Historical Provider, MD  ipratropium-albuterol (DUONEB) 0.5-2.5 (3) MG/3ML SOLN Take 3 mLs by nebulization every 6 (six) hours as needed (for shortness of breath/wheezing).    Historical Provider, MD  labetalol (NORMODYNE) 200 MG tablet Take 200 mg by mouth 2 (two) times daily.    Historical Provider, MD  levETIRAcetam (KEPPRA) 1000 MG tablet Take 2,000 mg by mouth 2 (two) times daily.    Historical Provider, MD  levothyroxine  (SYNTHROID, LEVOTHROID) 137 MCG tablet Take 0.5 tablets (68.5 mcg total) by mouth daily before breakfast. 08/09/12   Jeanella Craze, NP  losartan (COZAAR) 50 MG tablet Take 1 tablet (50 mg total) by mouth at bedtime. 07/31/12   Elliot Cousin, MD  olopatadine (PATANOL) 0.1 % ophthalmic solution Place 1 drop into both eyes 2 (two) times daily.    Historical Provider, MD  omeprazole (PRILOSEC) 20 MG capsule Take 20 mg by mouth daily.    Historical Provider, MD  polyethylene glycol (MIRALAX / GLYCOLAX) packet Take 17 g by mouth daily.    Historical Provider, MD  PRESCRIPTION MEDICATION 1 Device by Intrathecal route continuous. Per nursing home [facility], patient had Baclofen and Morphine in her pain pump.    Historical Provider, MD  simvastatin (ZOCOR) 20 MG tablet Take 20 mg by mouth at bedtime.    Historical Provider, MD   Allergies  Allergen Reactions  . Codeine Other (See Comments)    unknown    FAMILY HISTORY:  History reviewed. No pertinent family history. SOCIAL HISTORY:  reports that she has never smoked. She does not have any smokeless tobacco history on file. She reports that she does not drink alcohol or use illicit drugs.  REVIEW OF SYSTEMS:  Intubated on vent, unable to obtain  SUBJECTIVE: intubated  VITAL SIGNS: Temp:  [97.7 F (36.5 C)-98.1 F (36.7 C)] 97.7 F (36.5 C) (07/11 0530) Pulse Rate:  [81-101] 81 (07/11 0530) Resp:  [6-23] 16 (07/11 0530) BP: (58-114)/(31-76) 84/51 mmHg (07/11 0530) SpO2:  [86 %-100 %] 99 % (07/11 0530) FiO2 (%):  [100 %] 100 % (07/11 0500) HEMODYNAMICS:   VENTILATOR SETTINGS: Vent Mode:  [-] PRVC FiO2 (%):  [100 %] 100 % Set Rate:  [16 bmp] 16 bmp Vt Set:  [400 mL] 400 mL PEEP:  [5 cmH20] 5 cmH20 Plateau Pressure:  [24 cmH20] 24 cmH20 INTAKE / OUTPUT: Intake/Output   None     PHYSICAL EXAMINATION: General:  NAD, intubated Neuro:  R pupil reactive, L pupil sluggish. Opens eyes to voice. HEENT:  NCAT, ETT in place Cardiovascular:  RRR.  2+ pulses in all extremities. Lungs:  Coarse breath sounds bilaterally Abdomen:  +BS. Distended but nontender to palpation Ext:  Extremities atraumatic. S/p amputation of left fifth toe. Bilateral feet are chronically plantarflexed. Skin:  No rashes noted.  LABS:  Recent Labs Lab 09/16/12 0300 09/16/12 0315  HGB 12.8 13.9  WBC 8.0  --   PLT 143*  --   NA  --  145  K  --  4.1  CL  --  110  GLUCOSE  --  115*  BUN  --  17  CREATININE  --  1.30*   No results found for this basename: GLUCAP,  in the last 168 hours  CXR: appropriate placement of ET tube and OG tube, no focal infiltrates  ASSESSMENT / PLAN:  NEUROLOGIC A:  Likely  baclofen/morphine overdose. Indwelling baclofen pump. 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 History of seizures. P:   -Baclofen pump running at rate of 12 mcg/day currently (lowest amount allowed to still salvage device). Need to monitor closely for baclofen withdrawal, which can be life threatening (rhabdo). Will need to contact [employer] rep today to increase dose back up this afternoon to 200 mcg/day. -Continue narcan drip -repeat BMET/CK later 7/11  PULMONARY A: Hypercarbic respiratory failure likely due to morphine/baclofen OD.  Hx sleep apnea. P:   Full support with vent now Wean vent as tolerated CPAP once comes off vent  CARDIOVASCULAR A: Chronic hypertension, currently hypotensive likely due to baclofen overdose P:  Start phenylephrine IVF  RENAL A:  Acute Kidney Injury (cr 1.3, was 0.49 one month ago) P:   Hydrate with IVF Trend BMET CK and BMET this PM (due to risk of rhabdo with baclofen withdrawal)  GASTROINTESTINAL A:  Distended abdomen P:   Serial abdominal exams, consider imaging if appears to be in pain.  HEMATOLOGIC A:  Chronic thrombocytopenia P:  Trend CBC SCD's for DVT prevention  INFECTIOUS A:  No signs of obvious infection. P:   Trend WBC and fever  curve.  ENDOCRINE A:  Diabetes mellitus.  Hypothyroidism. P:   Restart synthroid once taking PO Sliding scale insulin   TODAY'S SUMMARY: admit, vent support, wean as able, call [employer] for increase baclofen today  Attending:  I have seen and examined the patient with nurse practitioner/resident and agree with the note above.    I have personally obtained a history, examined the patient, evaluated laboratory and imaging results, formulated the assessment and plan and placed orders. CRITICAL CARE: The patient is critically ill with multiple organ systems failure and requires high complexity decision making for assessment and support, frequent evaluation and titration of therapies, application of advanced monitoring technologies and extensive interpretation of multiple databases. Critical Care Time devoted to patient care services described in this note is 60 minutes.   Fonnie Jarvis Pulmonary and Critical Care Medicine [facility] Pager: [phone]  09/16/2012, 5:57 AM

## 2012-09-16 NOTE — ED Notes (Signed)
Pt brought in by EMS with report of drug overdose. Pt comes from nursing home, where she has a permanent pacemaker. EMS states that the pump is filled with Baclofen. Pt received 4 mg of Narcan via EMS, and reportedly became more responsive. Pt's lung sounds are diminished, and she has wheezing breathes.

## 2012-09-17 ENCOUNTER — Inpatient Hospital Stay (HOSPITAL_COMMUNITY): Payer: PRIVATE HEALTH INSURANCE

## 2012-09-17 DIAGNOSIS — T50901S Poisoning by unspecified drugs, medicaments and biological substances, accidental (unintentional), sequela: Secondary | ICD-10-CM

## 2012-09-17 DIAGNOSIS — R0989 Other specified symptoms and signs involving the circulatory and respiratory systems: Secondary | ICD-10-CM

## 2012-09-17 DIAGNOSIS — R4182 Altered mental status, unspecified: Secondary | ICD-10-CM

## 2012-09-17 DIAGNOSIS — J9600 Acute respiratory failure, unspecified whether with hypoxia or hypercapnia: Secondary | ICD-10-CM

## 2012-09-17 DIAGNOSIS — G894 Chronic pain syndrome: Secondary | ICD-10-CM

## 2012-09-17 DIAGNOSIS — Z8673 Personal history of transient ischemic attack (TIA), and cerebral infarction without residual deficits: Secondary | ICD-10-CM

## 2012-09-17 DIAGNOSIS — T6591XS Toxic effect of unspecified substance, accidental (unintentional), sequela: Secondary | ICD-10-CM

## 2012-09-17 LAB — BASIC METABOLIC PANEL
Calcium: 8.2 mg/dL — ABNORMAL LOW (ref 8.4–10.5)
GFR calc non Af Amer: >90 mL/min (ref >90)
Sodium: 144 mEq/L (ref 135–145)

## 2012-09-17 LAB — GLUCOSE, CAPILLARY
Glucose-Capillary: 69 mg/dL — ABNORMAL LOW (ref 70–99)
Glucose-Capillary: 71 mg/dL (ref 70–99)
Glucose-Capillary: 74 mg/dL (ref 70–99)
Glucose-Capillary: 77 mg/dL (ref 70–99)
Glucose-Capillary: 92 mg/dL (ref 70–99)

## 2012-09-17 LAB — BLOOD GAS, ARTERIAL
Bicarbonate: 22.5 mEq/L (ref 20.0–24.0)
FIO2: 0.4 %
O2 Saturation: 99.3 %
PEEP: 5 cmH2O
pO2, Arterial: 91.9 mmHg (ref 80.0–100.0)

## 2012-09-17 LAB — CBC
MCH: 30.4 pg (ref 26.0–34.0)
MCHC: 32.6 g/dL (ref 30.0–36.0)
Platelets: 134 10*3/uL — ABNORMAL LOW (ref 150–400)

## 2012-09-17 LAB — PHOSPHORUS: Phosphorus: 2.3 mg/dL (ref 2.3–4.6)

## 2012-09-17 MED ORDER — LEVETIRACETAM 100 MG/ML PO SOLN
2000.0000 mg | Freq: Two times a day (BID) | ORAL | Status: DC
  Administered 2012-09-17 – 2012-09-23 (×11): 2000 mg via ORAL
  Filled 2012-09-17 (×15): qty 20

## 2012-09-17 MED ORDER — BIOTENE DRY MOUTH MT LIQD
15.0000 mL | Freq: Four times a day (QID) | OROMUCOSAL | Status: DC
  Administered 2012-09-18 – 2012-09-20 (×11): 15 mL via OROMUCOSAL

## 2012-09-17 MED ORDER — PRO-STAT SUGAR FREE PO LIQD
30.0000 mL | Freq: Every day | ORAL | Status: DC
  Administered 2012-09-17 – 2012-09-23 (×15): 30 mL
  Filled 2012-09-17 (×34): qty 30

## 2012-09-17 MED ORDER — MAGNESIUM SULFATE 40 MG/ML IJ SOLN
2.0000 g | Freq: Once | INTRAMUSCULAR | Status: AC
  Administered 2012-09-17: 2 g via INTRAVENOUS
  Filled 2012-09-17: qty 50

## 2012-09-17 MED ORDER — CHLORHEXIDINE GLUCONATE 0.12 % MT SOLN
15.0000 mL | Freq: Two times a day (BID) | OROMUCOSAL | Status: DC
  Administered 2012-09-18 – 2012-09-20 (×5): 15 mL via OROMUCOSAL
  Filled 2012-09-17 (×6): qty 15

## 2012-09-17 MED ORDER — CHLORHEXIDINE GLUCONATE 0.12 % MT SOLN
OROMUCOSAL | Status: AC
  Administered 2012-09-17: 15 mL
  Filled 2012-09-17: qty 15

## 2012-09-17 MED ORDER — PANTOPRAZOLE SODIUM 40 MG PO PACK
40.0000 mg | PACK | Freq: Every day | ORAL | Status: DC
  Administered 2012-09-17 – 2012-09-19 (×3): 40 mg
  Filled 2012-09-17 (×4): qty 20

## 2012-09-17 MED ORDER — LEVOTHYROXINE SODIUM 137 MCG PO TABS
68.5000 ug | ORAL_TABLET | Freq: Every day | ORAL | Status: DC
  Administered 2012-09-18 – 2012-09-19 (×2): 68.5 ug
  Administered 2012-09-20: 10:00:00
  Administered 2012-09-21 – 2012-09-23 (×3): 68.5 ug
  Filled 2012-09-17 (×8): qty 0.5

## 2012-09-17 MED ORDER — VALPROIC ACID 250 MG/5ML PO SYRP
500.0000 mg | ORAL_SOLUTION | Freq: Two times a day (BID) | ORAL | Status: DC
  Administered 2012-09-17 – 2012-09-20 (×6): 500 mg
  Filled 2012-09-17 (×8): qty 10

## 2012-09-17 MED ORDER — JEVITY 1.2 CAL PO LIQD
1000.0000 mL | ORAL | Status: DC
  Administered 2012-09-17 (×2): 1000 mL
  Filled 2012-09-17 (×3): qty 1000

## 2012-09-17 NOTE — Progress Notes (Signed)
NUTRITION FOLLOW UP/consult  Intervention:   1. Initiate TF via OGT with Jevity 1.2 at 15 ml/h, increase by 10 ml every 4 hours to goal rate of 25 ml/h with Prostat 30 ml 5 times daily to provide 1220 kcals (24 kcals/kg ideal weight), 108 gm protein, 486 ml free water daily.  Nutrition Dx:   Inadequate oral intake related to inability to eat as evidenced by NPO status. Ongoing   Goal:   Enteral nutrition to provide 60-70% of estimated calorie needs (22-25 kcals/kg ideal body weight) and 100% of estimated protein needs, based on ASPEN guidelines for permissive underfeeding in critically ill obese individuals.   Monitor:   TF tolerance, weight trends, labs, I/O's, vent status  Assessment:   Pt remains intubated at this time. Failed SBT this am per noted. MD ordered initiation and management of enteral nutrition.   Height: Ht Readings from Last 1 Encounters:  09/16/12 5\' 2"  (1.575 m)    Weight Status:   Wt Readings from Last 1 Encounters:  09/17/12 207 lb 7.3 oz (94.1 kg)   Patient is currently intubated on ventilator support.  MV: 12.2  Temp:Temp (24hrs), Avg:99.2 F (37.3 C), Min:98.2 F (36.8 C), Max:99.8 F (37.7 C)  Propofol: none  Re-estimated needs:  Kcal: 1882 Protein: 100-115 gm  Fluid: 1.8-1.9 L   Skin: intact   Diet Order:  NPO   Intake/Output Summary (Last 24 hours) at 09/17/12 1138 Last data filed at 09/17/12 1100  Gross per 24 hour  Intake    615 ml  Output    800 ml  Net   -185 ml    Last BM: 7/11   Labs:   Recent Labs Lab 09/16/12 0315 09/16/12 0900 09/16/12 1430 09/17/12 0420  NA 145  --  146* 144  K 4.1  --  3.7 3.7  CL 110  --  112 113*  CO2  --   --  21 23  BUN 17  --  15 15  CREATININE 1.30* 1.10 0.90 0.70  CALCIUM  --   --  8.0* 8.2*  MG  --   --   --  1.5  PHOS  --   --   --  2.3  GLUCOSE 115*  --  79 80    CBG (last 3)   Recent Labs  09/16/12 2348 09/17/12 0409 09/17/12 0709  GLUCAP 69* 77 73    Scheduled  Meds: . enoxaparin (LOVENOX) injection  40 mg Subcutaneous Q24H  . levETIRAcetam  2,000 mg Oral BID  . [START ON 09/18/2012] levothyroxine  68.5 mcg Per Tube QAC breakfast  . pantoprazole sodium  40 mg Per Tube QHS  . Valproic Acid  500 mg Per Tube BID    Continuous Infusions: . baclofen 40 mg (09/16/12 1330)    Clarene Duke RD, LDN Pager [phone] After Hours pager [phone]

## 2012-09-17 NOTE — Progress Notes (Signed)
PULMONARY  / CRITICAL CARE MEDICINE  Name: [name] MRN: [id number] DOB: 12-15-1955    ADMISSION DATE:  09/16/2012 CONSULTATION DATE:  09/16/2012  REFERRING MD :  ED PRIMARY SERVICE: CCM  CHIEF COMPLAINT:  Respiratory failure  BRIEF PATIENT DESCRIPTION: 57 yo F NH pt with likely baclofen/morphine overdose  PMHx of Hemorrhagic CVA with Lt sided weakness, chronic pain with indwelling morphine pain pump, HTN, Seizure disorder, OSA, Depression, DM, Hypothyroidism   SIGNIFICANT EVENTS / STUDIES:  7/11 >>> admit, intubated in ED with respiratory failure  LINES / TUBES: ETT 7/11 >>> PIV  CULTURES: 7/11 Blood >>> 7/11 Urine >>>  ANTIBIOTICS: None  HISTORY OF PRESENT ILLNESS:  [name] is a 57 y.o. female NH resident who is on a chronic infusion of baclofen and morphine via an implanted intraabdominal pump. 7/10- pt was seen by her neurologist and the morphine was reportedly taken out of her pump. This morning she was found at her NH unresponsive with a respiratory rate of around 6 so EMS was called. En route she responded briefly to 2mg  IV narcan. In the ER she was given additional dose of Narcan and became hypotensive with systolics down to the 50s. She was eventually intubated due to respiratory depression. The [employer] device representative was called in to disable to the baclofen pump.   Per [employer], pt was getting 415mcg/day of baclofen prior to turning device down in ED. Device was turned down to 10mcg/day by representative, which is the lowest possible dose in order to salvage the device. On call device representative phone # is [phone].   SUBJECTIVE:  7/12 >Improved alertness, following simple commands Failed SBT this am   VITAL SIGNS: Temp:  [97.1 F (36.2 C)-99.8 F (37.7 C)] 99.5 F (37.5 C) (07/12 0724) Pulse Rate:  [75-89] 87 (07/12 0724) Resp:  [0-30] 30 (07/12 0724) BP: (90-140)/(58-99) 125/78 mmHg (07/12 0724) SpO2:  [96  %-100 %] 99 % (07/12 0724) FiO2 (%):  [40 %-50 %] 40 % (07/12 0724) Weight:  [205 lb 4 oz (93.1 kg)-207 lb 7.3 oz (94.1 kg)] 207 lb 7.3 oz (94.1 kg) (07/12 0451) HEMODYNAMICS:   VENTILATOR SETTINGS: Vent Mode:  [-] PRVC FiO2 (%):  [40 %-50 %] 40 % Set Rate:  [24 bmp-30 bmp] 30 bmp Vt Set:  [400 mL] 400 mL PEEP:  [5 cmH20] 5 cmH20 Plateau Pressure:  [18 cmH20-23 cmH20] 19 cmH20 INTAKE / OUTPUT: Intake/Output     07/11 0701 - 07/12 0700 07/12 0701 - 07/13 0700   I.V. (mL/kg) 290.3 (3.1)    IV Piggyback 350    Total Intake(mL/kg) 640.3 (6.8)    Urine (mL/kg/hr) 945 (0.4)    Total Output 945     Net -304.8          Stool Occurrence 2 x     PHYSICAL EXAMINATION: General:  NAD, intubated Neuro:  R pupil reactive, L pupil sluggish. Opens eyes to voice., following commands HEENT:  NCAT, ETT in place Cardiovascular:  RRR. 2+ pulses in all extremities. Lungs:  Coarse breath sounds bilaterally Abdomen:  +BS. Distended but nontender to palpation. INTRATHECAL PUMP in SQ SPACE LLQ Ext:  Extremities atraumatic. S/p amputation of left fifth toe. Bilateral feet are chronically plantarflexed. Skin:  No rashes noted.      LABS: CXR: 7/11 appropriate placement of ET tube and OG tube, no focal infiltrates Ct Head Wo Contrast  09/16/2012   *RADIOLOGY REPORT*  Clinical Data: Drug overdose.  Altered mental status.  CT HEAD WITHOUT CONTRAST  Technique:  Contiguous axial images were obtained from the base of the skull through the vertex without contrast.  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  Low attenuation change along the right frontal parietal and temporal deep white matter consistent with old infarct.  Old lacunar infarcts in the left basal ganglia.  Old infarct and calcification in the deep white matter in the left anterior frontal region.  Ventricular dilatation likely related to contraction of the encephalomalacia. No acute mass effect or midline shift.  No  abnormal extra-axial fluid collections.  Gray-white matter junctions are distinct. Basal cisterns are not effaced.  No evidence of acute intracranial hemorrhage.  No depressed skull fractures.  Visualized paranasal sinuses and mastoid air cells are not opacified.  No significant changes since the previous study.  IMPRESSION: No evidence of acute intracranial abnormality.  Chronic changes including aneurysm clip and old bilateral deep white matter infarcts.   Original Report Authenticated By: Burman Nieves, M.D.   Dg Chest Port 1 View  09/17/2012   *RADIOLOGY REPORT*  Clinical Data: Acute respiratory failure on ventilator.  PORTABLE CHEST - 1 VIEW  Comparison: 09/16/2012  Findings: Endotracheal tube and nasogastric tube remain in appropriate position.  Low lung volumes are again seen. Subsegmental atelectasis again seen in the left lung base. Previously noted opacity in the central right upper lobe is no longer visualized.  No evidence of pulmonary consolidation or edema.  Heart size is within normal limits allowing for low lung volumes.  IMPRESSION: Resolution of right upper lobe opacity.  Persistent left lower lobe subsegmental atelectasis.   Original Report Authenticated By: Myles Rosenthal, M.D.   Dg Chest Portable 1 View  09/16/2012   *RADIOLOGY REPORT*  Clinical Data: Drug overdose.  PORTABLE CHEST - 1 VIEW  Comparison: 08/06/2012  Findings: Endotracheal tube has been placed.  The tip measures about 1.9 cm above the carina.  Enteric tube tip is in the left upper quadrant consistent with location in the distal stomach. Shallow inspiration.  Atelectasis or infiltration in the left lung base and right upper lung.  Suggestion of perihilar infiltration on the left.  No pneumothorax.  No blunting of costophrenic angles.  IMPRESSION: Appliances appear to be satisfactory location.  Shallow inspiration.  Infiltration or atelectasis in the right upper lung and left lower lung with possible left perihilar infiltrates.    Original Report Authenticated By: Burman Nieves, M.D.     ASSESSMENT / PLAN:  NEUROLOGIC A:  Likely baclofen/morphine overdose. Indwelling baclofen pump. 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 History of seizures. >Baclofen rate adjusted to /day (1/2 of pt home dose) 09/16/12 at 1245.  CK elevated (834) ? Baclofen related   09/17/2012: Normal mental status. CAM-ICU negative for delirium P:   - Continue Baclofen pump running at rate of  mcg/day currently  (do not dc abruptly due to risk of rhabdo, call [employer] for help) -remain off Narcan drip  -- Repeat CK today 09/17/2012 -Continue Keppra  And depacon   PULMONARY  Recent Labs Lab 09/16/12 0315 09/16/12 0608 09/16/12 0737 09/17/12 0349  PHART  --  7.183* 7.289* 7.450  PCO2ART  --  68.3* 49.1* 33.0*  PO2ART  --  221.0* 81.0 91.9  HCO3  --  25.7* 23.9 22.5  TCO2 25 28 25  23.6  O2SAT  --  100.0 95.0 99.3  A: Hypercarbic respiratory failure likely due to morphine/baclofen OD.  Hx sleep  apnea. 7/12 >failed wean/SBT , abg improved   P:    - Wean vent as tolerated. - CPAP once comes off vent.  -adjust vent setting per MD  -check cxr today -check abg in am    CARDIOVASCULAR  No results found for this basename: TROPONINI,  in the last 168 hours No results found for this basename: PROBNP,  in the last 168 hours  A: Chronic hypertension, currently hypotensive likely due to baclofen overdose and narcotic overdose. >b/p improved off pressors  P:  - IVF KVO but it CPK is increasing we'll have to increase fluids -HTN rx on hold for now  -monitor b/p  0- serial cardiac enzymes    RENAL  Recent Labs Lab 09/16/12 0315 09/16/12 0900 09/16/12 1430 09/17/12 0420  NA 145  --  146* 144  K 4.1  --  3.7 3.7  CL 110  --  112 113*  CO2  --   --  21 23  GLUCOSE 115*  --  79 80  BUN 17  --  15 15  CREATININE 1.30* 1.10 0.90 0.70  CALCIUM  --   --  8.0* 8.2*  MG   --   --   --  1.5  PHOS  --   --   --  2.3   A:  Acute Kidney Injury (cr 1.3, was 0.49 one month ago)  09/17/2012>scr tr down but mild hypomagnesemia and hypokalemia. Also CPK was high yesterday  P:   Replace magnesium - KVO IVF. - Trend BMET. - repeat CK today  (due to risk of rhabdo with baclofen withdrawal); if increasing increase IV fluid rate  GASTROINTESTINAL No results found for this basename: AST, ALT, ALKPHOS, BILITOT, PROT, ALBUMIN, INR,  in the last 168 hours  A:  Nutrition  P:   - Begin TF   -PPI   HEMATOLOGIC    Recent Labs Lab 09/16/12 0300 09/16/12 0315 09/16/12 0900 09/17/12 0420  HGB 12.8 13.9 11.9* 11.9*  HCT 40.9 41.0 38.4 36.5  WBC 8.0  --  8.8 7.7  PLT 143*  --  137* 134*   A:  Chronic thrombocytopenia P:  - Trend CBC. - Lovenox/SCDs for DVT prevention. -Pack red blood cell hemoglobin less than 7 g percent only  INFECTIOUS No results found for this basename: LATICACIDVEN, PROCALCITON,  in the last 168 hours  A:  No signs of obvious infection. P:   - Trend WBC and fever curve.  ENDOCRINE CBG (last 3)   Recent Labs  09/16/12 2348 09/17/12 0409 09/17/12 0709  GLUCAP 69* 77 73     A:  Diabetes mellitus.  Hypothyroidism.   P:   - Restart synthroid . - start Sliding scale insulin  If needed  -check tsh in am   Global: No family at bedside 09/17/2012   PARRETT,TAMMY,NP- C Pulmonary and Critical Care Medicine [facility] Pager: [phone]  09/17/2012, 8:11 AM  STAFF NOTE: I, Dr Lavinia Sharps have personally reviewed patient's available data, including medical history, events of note, physical examination and test results as part of my evaluation. I have discussed with resident/NP and other care providers such as pharmacist, RN and RRT.  In addition,  I personally evaluated patient and elicited key findings of acute respiratory failure due to altered mental status due to overdose of opioids. Currently having normal  mental status. However she failed spontaneous breathing trial. If there is mild elevation of CPK. We will repeat this today and if elevated will  increase IV fluids to treat for rhabdomyolysis. Continue baclofen.  Rest per NP/medical resident whose note is outlined above and that I agree with  The patient is critically ill with multiple organ systems failure and requires high complexity decision making for assessment and support, frequent evaluation and titration of therapies, application of advanced monitoring technologies and extensive interpretation of multiple databases.   Critical Care Time devoted to patient care services described in this note is  31  Minute independent of nurse practitioner time  Dr. Kalman Shan, M.D., [facility].C.P Pulmonary and Critical Care Medicine Staff Physician  System [place] [facility] Pager: [phone], If no answer or between  15:00h - 7:00h: call [phone]  09/17/2012 11:41 AM

## 2012-09-17 NOTE — Progress Notes (Signed)
Pt does not meet inclusion criteria for [facility] electrolyte replacement protocol secondary to low urine output. Mg 1.5 reported to eMD

## 2012-09-18 ENCOUNTER — Inpatient Hospital Stay (HOSPITAL_COMMUNITY): Payer: PRIVATE HEALTH INSURANCE

## 2012-09-18 LAB — GLUCOSE, CAPILLARY
Glucose-Capillary: 91 mg/dL (ref 70–99)
Glucose-Capillary: 108 mg/dL — ABNORMAL HIGH (ref 70–99)
Glucose-Capillary: 107 mg/dL — ABNORMAL HIGH (ref 70–99)
Glucose-Capillary: 141 mg/dL — ABNORMAL HIGH (ref 70–99)

## 2012-09-18 LAB — BASIC METABOLIC PANEL
CO2: 23 mEq/L (ref 19–32)
Chloride: 112 mEq/L (ref 96–112)
Creatinine, Ser: 0.57 mg/dL (ref 0.50–1.10)
GFR calc Af Amer: >90 mL/min (ref >90)
Potassium: 3.2 mEq/L — ABNORMAL LOW (ref 3.5–5.1)
Sodium: 143 mEq/L (ref 135–145)

## 2012-09-18 LAB — LACTIC ACID, PLASMA: Lactic Acid, Venous: 1.3 mmol/L (ref 0.5–2.2)

## 2012-09-18 LAB — CBC
Platelets: 132 10*3/uL — ABNORMAL LOW (ref 150–400)
RBC: 3.72 MIL/uL — ABNORMAL LOW (ref 3.87–5.11)
RDW: 14.5 % (ref 11.5–15.5)
WBC: 7.2 10*3/uL (ref 4.0–10.5)

## 2012-09-18 LAB — BLOOD GAS, ARTERIAL
Acid-Base Excess: 1.9 mmol/L (ref 0.0–2.0)
Bicarbonate: 24.9 mEq/L — ABNORMAL HIGH (ref 20.0–24.0)
Drawn by: 31101
FIO2: 0.4 %
O2 Saturation: 98.9 %
RATE: 30 resp/min
pCO2 arterial: 31.8 mmHg — ABNORMAL LOW (ref 35.0–45.0)
pO2, Arterial: 86.2 mmHg (ref 80.0–100.0)

## 2012-09-18 LAB — PHOSPHORUS: Phosphorus: 3.1 mg/dL (ref 2.3–4.6)

## 2012-09-18 LAB — URINE CULTURE

## 2012-09-18 LAB — TSH: TSH: 3.415 u[IU]/mL (ref 0.350–4.500)

## 2012-09-18 LAB — MAGNESIUM: Magnesium: 2 mg/dL (ref 1.5–2.5)

## 2012-09-18 LAB — PRO B NATRIURETIC PEPTIDE: Pro B Natriuretic peptide (BNP): 180.8 pg/mL — ABNORMAL HIGH (ref 0–125)

## 2012-09-18 LAB — CK: Total CK: 206 U/L — ABNORMAL HIGH (ref 7–177)

## 2012-09-18 LAB — TROPONIN I: Troponin I: <0.3 ng/mL (ref <0.30)

## 2012-09-18 MED ORDER — SODIUM CHLORIDE 0.9 % IV SOLN
250.0000 mL | INTRAVENOUS | Status: DC | PRN
  Administered 2012-09-18: 250 mL via INTRAVENOUS
  Administered 2012-09-19: 1000 mL via INTRAVENOUS

## 2012-09-18 MED ORDER — DEXTROSE 5 % IV SOLN
1.0000 g | INTRAVENOUS | Status: DC
  Administered 2012-09-18: 1 g via INTRAVENOUS
  Filled 2012-09-18: qty 10

## 2012-09-18 MED ORDER — POTASSIUM CHLORIDE 20 MEQ/15ML (10%) PO LIQD
40.0000 meq | Freq: Once | ORAL | Status: AC
  Administered 2012-09-18: 40 meq
  Filled 2012-09-18: qty 30

## 2012-09-18 MED ORDER — SODIUM CHLORIDE 0.9 % IV SOLN
500.0000 mg | Freq: Four times a day (QID) | INTRAVENOUS | Status: DC
  Administered 2012-09-18 – 2012-09-22 (×16): 500 mg via INTRAVENOUS
  Filled 2012-09-18 (×18): qty 500

## 2012-09-18 MED ORDER — SODIUM CHLORIDE 0.9 % IV BOLUS (SEPSIS)
1000.0000 mL | Freq: Once | INTRAVENOUS | Status: AC
  Administered 2012-09-18: 1000 mL via INTRAVENOUS

## 2012-09-18 NOTE — Progress Notes (Addendum)
ANTIBIOTIC CONSULT NOTE - INITIAL  Pharmacy Consult for Ceftriaxone --> imipenem Indication: Ecoli in urine  Allergies  Allergen Reactions  . Codeine Other (See Comments)    unknown    Patient Measurements: Height: 5\' 2"  (157.5 cm) Weight: 209 lb 14.1 oz (95.2 kg) IBW/kg (Calculated) : 50.1 Adjusted Body Weight: n/a  Vital Signs: Temp: 97.3 F (36.3 C) (07/13 1000) Temp src: Core (Comment) (07/13 0700) BP: 112/65 mmHg (07/13 1000) Pulse Rate: 68 (07/13 1000) Intake/Output from previous day: 07/12 0701 - 07/13 0700 In: 1427 [I.V.:480; NG/GT:797; IV Piggyback:150] Out: 880 [Urine:855; Stool:25] Intake/Output from this shift: Total I/O In: 255 [I.V.:60; NG/GT:195] Out: 80 [Urine:80]  Labs:  Recent Labs  09/16/12 0900 09/16/12 1430 09/17/12 0420 09/18/12 0340  WBC 8.8  --  7.7 7.2  HGB 11.9*  --  11.9* 11.1*  PLT 137*  --  134* 132*  CREATININE 1.10 0.90 0.70 0.57   Estimated Creatinine Clearance: 83.4 ml/min (by C-G formula based on Cr of 0.57). No results found for this basename: VANCOTROUGH, Leodis Binet, VANCORANDOM, GENTTROUGH, GENTPEAK, GENTRANDOM, TOBRATROUGH, TOBRAPEAK, TOBRARND, AMIKACINPEAK, AMIKACINTROU, AMIKACIN,  in the last 72 hours   Microbiology: Recent Results (from the past 720 hour(s))  URINE CULTURE     Status: None   Collection Time    09/16/12  3:57 AM      Result Value Range Status   Specimen Description URINE, CATHETERIZED   Final   Special Requests NONE   Final   Culture  Setup Time 09/16/2012 08:34   Final   Colony Count >=100,000 COLONIES/ML   Final   Culture ESCHERICHIA COLI   Final   Report Status PENDING   Incomplete  MRSA PCR SCREENING     Status: None   Collection Time    09/16/12  8:17 AM      Result Value Range Status   MRSA by PCR NEGATIVE  NEGATIVE Final   Comment:            The GeneXpert MRSA Assay (FDA     approved for NASAL specimens     only), is one component of a     comprehensive MRSA colonization   surveillance program. It is not     intended to diagnose MRSA     infection nor to guide or     monitor treatment for     MRSA infections.  CULTURE, BLOOD (ROUTINE X 2)     Status: None   Collection Time    09/16/12  9:40 AM      Result Value Range Status   Specimen Description BLOOD RIGHT ARM   Final   Special Requests BOTTLES DRAWN AEROBIC ONLY 6CC   Final   Culture  Setup Time 09/16/2012 14:00   Final   Culture     Final   Value:        BLOOD CULTURE RECEIVED NO GROWTH TO DATE CULTURE WILL BE HELD FOR 5 DAYS BEFORE ISSUING A FINAL NEGATIVE REPORT   Report Status PENDING   Incomplete  CULTURE, BLOOD (ROUTINE X 2)     Status: None   Collection Time    09/16/12 12:25 PM      Result Value Range Status   Specimen Description BLOOD RIGHT HAND   Final   Special Requests BOTTLES DRAWN AEROBIC ONLY 3CC   Final   Culture  Setup Time 09/16/2012 16:21   Final   Culture     Final   Value:  BLOOD CULTURE RECEIVED NO GROWTH TO DATE CULTURE WILL BE HELD FOR 5 DAYS BEFORE ISSUING A FINAL NEGATIVE REPORT   Report Status PENDING   Incomplete    Medical History: Past Medical History  Diagnosis Date  . Hypertension   . Seizure disorder   . Metabolic encephalopathy 05/06/2010  . Respiratory failure 05/06/10  . UTI (lower urinary tract infection) 05/06/10  . 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 Sleep apnea 04/2010    on CPAP, "severe central sleep apnea"  . 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 Chronic pain   . Depression   . 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 Spasticity     chronic  . Diabetes mellitus without complication   . Thrombocytopenia, unspecified 07/28/2012  . Acute respiratory failure 07/27/2012  . Narcotic overdose 07/27/2012  . 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 Unspecified hypothyroidism   . Sleep apnea     Medications:  Scheduled:  . antiseptic oral rinse  15 mL Mouth Rinse QID  .  cefTRIAXone (ROCEPHIN)  IV  1 g Intravenous Q24H  . chlorhexidine  15 mL Mouth Rinse BID  . enoxaparin (LOVENOX) injection  40 mg Subcutaneous Q24H  . feeding supplement  30 mL Per Tube 5 X Daily  . levETIRAcetam  2,000 mg Oral BID  . levothyroxine  68.5 mcg Per Tube QAC breakfast  . pantoprazole sodium  40 mg Per Tube QHS  . Valproic Acid  500 mg Per Tube BID   Assessment: 57 yo female admitted with suspected narcotic overdose.  Now noted to be growing Ecoli in urine culture, sensitivities pending.  Renal function stable, CrCl ~ 83 ml/min.  Goal of Therapy:  Resolution of infection  Plan:  1. Ceftriaxone 1g IV q 24 hrs. 2. F/U sensitivites of Ecoli. 3. Continue to monitor renal function and clinical course.  Tad Moore, BCPS  Clinical Pharmacist Pager [phone]  09/18/2012 11:40 AM   Addendum: Sensitivities now available - E. 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 Plan: Imipenem 500 mg IV q 6 hrs. F/U LOT.   Tad Moore, BCPS  Clinical Pharmacist Pager [phone]  09/18/2012 2:06 PM

## 2012-09-18 NOTE — Progress Notes (Signed)
eLink Physician-Brief Progress Note Patient Name: [name] DOB: 1955-05-24 MRN: [id number]  Date of Service  09/18/2012   HPI/Events of Note   resp alkalosis, RR set 30  eICU Interventions  Decrease RR to 18      [name] 09/18/2012, 4:29 AM

## 2012-09-18 NOTE — Progress Notes (Signed)
PULMONARY  / CRITICAL CARE MEDICINE  Name: [name] MRN: [id number] DOB: 1956-02-20    ADMISSION DATE:  09/16/2012 CONSULTATION DATE:  09/16/2012  REFERRING MD :  ED PRIMARY SERVICE: CCM  CHIEF COMPLAINT:  Respiratory failure  BRIEF PATIENT DESCRIPTION: 57 yo F NH pt with likely baclofen/morphine overdose  PMHx of Hemorrhagic CVA with Lt sided weakness, chronic pain with indwelling morphine pain pump, HTN, Seizure disorder, OSA, Depression, DM, Hypothyroidism   SIGNIFICANT EVENTS / STUDIES:  7/11 >>> admit, intubated in ED with respiratory failure  LINES / TUBES: ETT 7/11 >>> PIV  CULTURES: 7/11 Blood >>> 7/11 Urine >>>ecoli (MDR )  ANTIBIOTICS: 7/13 Rocephin >7/13  7/13 Imipenem >>  HISTORY OF PRESENT ILLNESS:  [name] is a 57 y.o. female NH resident who is on a chronic infusion of baclofen and morphine via an implanted intraabdominal pump. 7/10- pt was seen by her neurologist and the morphine was reportedly taken out of her pump. This morning she was found at her NH unresponsive with a respiratory rate of around 6 so EMS was called. En route she responded briefly to 2mg  IV narcan. In the ER she was given additional dose of Narcan and became hypotensive with systolics down to the 50s. She was eventually intubated due to respiratory depression. The [employer] device representative was called in to disable to the baclofen pump.   Per [employer], pt was getting 460mcg/day of baclofen prior to turning device down in ED. Device was turned down to 38mcg/day by representative, which is the lowest possible dose in order to salvage the device. On call device representative phone # is [phone].   SUBJECTIVE:  7/13> failed wean this am . Urine e colii  VITAL SIGNS: Temp:  [92 F (33.3 C)-99.6 F (37.6 C)] 97.8 F (36.6 C) (07/13 1200) Pulse Rate:  [67-87] 69 (07/13 1200) Resp:  [0-30] 18 (07/13 1153) BP: (98-152)/(19-87) 99/55 mmHg (07/13  1200) SpO2:  [98 %-100 %] 99 % (07/13 1200) FiO2 (%):  [40 %] 40 % (07/13 1153) Weight:  [209 lb 14.1 oz (95.2 kg)] 209 lb 14.1 oz (95.2 kg) (07/13 0400) HEMODYNAMICS:   VENTILATOR SETTINGS: Vent Mode:  [-] PRVC FiO2 (%):  [40 %] 40 % Set Rate:  [18 bmp-30 bmp] 18 bmp Vt Set:  [400 mL] 400 mL PEEP:  [5 cmH20] 5 cmH20 Plateau Pressure:  [17 cmH20-19 cmH20] 18 cmH20 INTAKE / OUTPUT: Intake/Output     07/12 0701 - 07/13 0700 07/13 0701 - 07/14 0700   I.V. (mL/kg) 480 (5) 100 (1.1)   NG/GT 797 245   IV Piggyback 150    Total Intake(mL/kg) 1427 (15) 345 (3.6)   Urine (mL/kg/hr) 855 (0.4) 205 (0.3)   Stool 25 (0)    Total Output 880 205   Net +547 +140         PHYSICAL EXAMINATION: General:  NAD, intubated Neuro:  R pupil reactive, L pupil sluggish. Opens eyes to voice., following commands HEENT:  NCAT, ETT in place Cardiovascular:  RRR. 2+ pulses in all extremities. Lungs:  Coarse breath sounds bilaterally Abdomen:  +BS. Distended but nontender to palpation. INTRATHECAL PUMP in SQ SPACE LLQ Ext:  Extremities atraumatic. S/p amputation of left fifth toe. Bilateral feet are chronically plantarflexed. Skin:  No rashes noted.      LABS: CXR: 7/13> Low lung volumes again demonstrated.  Bilateral perihilar and basilar atelectasis shows no significant  change. No evidence of pulmonary consolidation or edema   Dg  Chest Port 1 View  09/18/2012   *RADIOLOGY REPORT*  Clinical Data: Acute respiratory failure on ventilator.  Morbid obesity.  PORTABLE CHEST - 1 VIEW  Comparison: 09/17/2012  Findings: Endotracheal tube and nasogastric tube remain in appropriate position.  Low lung volumes again demonstrated. Bilateral perihilar and basilar atelectasis shows no significant change.  No evidence of pulmonary consolidation or edema.  Heart size is normal.  IMPRESSION: No significant change in the bilateral subsegmental atelectasis.   Original Report Authenticated By: Myles Rosenthal, M.D.   Dg Chest  Port 1 View  09/17/2012   *RADIOLOGY REPORT*  Clinical Data: Acute respiratory failure on ventilator.  PORTABLE CHEST - 1 VIEW  Comparison: 09/16/2012  Findings: Endotracheal tube and nasogastric tube remain in appropriate position.  Low lung volumes are again seen. Subsegmental atelectasis again seen in the left lung base. Previously noted opacity in the central right upper lobe is no longer visualized.  No evidence of pulmonary consolidation or edema.  Heart size is within normal limits allowing for low lung volumes.  IMPRESSION: Resolution of right upper lobe opacity.  Persistent left lower lobe subsegmental atelectasis.   Original Report Authenticated By: Myles Rosenthal, M.D.     ASSESSMENT / PLAN:  NEUROLOGIC A:  Likely baclofen/morphine overdose. Indwelling baclofen pump. 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 History of seizures. >Baclofen rate adjusted to /day (1/2 of pt home dose) 09/16/12 at 1245.  CK elevated 834 >538  ? Baclofen related   09/18/2012: following commands, no seizure act noted  P:   - Continue Baclofen pump running at rate of  mcg/day currently  (do not dc abruptly due to risk of rhabdo, call [employer] for help) -remain off Narcan drip  = fluid bolus x 1 pm of 09/18/12 to clear off CPK (staff note) -- Repeat CK  -Continue Keppra  And depacon   PULMONARY  Recent Labs Lab 09/16/12 0315 09/16/12 0608 09/16/12 0737 09/17/12 0349 09/18/12 0251  PHART  --  7.183* 7.289* 7.450 7.505*  PCO2ART  --  68.3* 49.1* 33.0* 31.8*  PO2ART  --  221.0* 81.0 91.9 86.2  HCO3  --  25.7* 23.9 22.5 24.9*  TCO2 25 28 25  23.6 25.9  O2SAT  --  100.0 95.0 99.3 98.9  A: Hypercarbic respiratory failure likely due to morphine/baclofen OD.  Hx sleep apnea. 7/13 >failed wean/SBT , vent adjusted   P:    - Wean vent as tolerated. - CPAP once comes off vent.  -adjust vent setting per MD  -check cxr / abg in am    CARDIOVASCULAR     Recent Labs Lab 09/17/12 1325 09/17/12 2230 09/18/12 0340  TROPONINI <0.30 <0.30 <0.30    Recent Labs Lab 09/18/12 0340  PROBNP 180.8*    A: Chronic hypertension, currently hypotensive likely due to baclofen overdose and narcotic overdose.>off pressors x 24hr , card enzymes neg x 3   P:  - IVF KVO but it CPK is increasing we'll have to increase fluids -HTN rx on hold for now  -monitor b/p       RENAL  Recent Labs Lab 09/16/12 0315 09/16/12 0900 09/16/12 1430 09/17/12 0420 09/18/12 0340  NA 145  --  146* 144 143  K 4.1  --  3.7 3.7 3.2*  CL 110  --  112 113* 112  CO2  --   --  21 23 23   GLUCOSE 115*  --  79 80 95  BUN 17  --  15 15 19   CREATININE 1.30* 1.10 0.90 0.70 0.57  CALCIUM  --   --  8.0* 8.2* 8.5  MG  --   --   --  1.5 2.0  PHOS  --   --   --  2.3 3.1   A:  Acute Kidney Injury (cr 1.3, was 0.49 one month ago)>resolved  Hypomagnesium Also poor CK clearance  P:    - KVO IVF. - Trend BMET. - repeat CK 09/19/12  (due to risk of rhabdo with baclofen withdrawal); give fluid bolus and increase fluid rate 09/18/12 due to poor clearince   GASTROINTESTINAL No results found for this basename: AST, ALT, ALKPHOS, BILITOT, PROT, ALBUMIN, INR,  in the last 168 hours  A:  Nutrition  P:   -  TF   -PPI   HEMATOLOGIC    Recent Labs Lab 09/16/12 0900 09/17/12 0420 09/18/12 0340  HGB 11.9* 11.9* 11.1*  HCT 38.4 36.5 34.2*  WBC 8.8 7.7 7.2  PLT 137* 134* 132*   A:  Chronic thrombocytopenia P:  - Trend CBC. - Lovenox/SCDs for DVT prevention. -Pack red blood cell hemoglobin less than 7 g percent only  INFECTIOUS  Recent Labs Lab 09/18/12 0800  LATICACIDVEN 1.3    A:  E. Coli UTI -MDR P:   - follow cx data  -Begin Imipenem 7/13 (no seiz act/ good Scr /ccl )  - Increase fluids  ENDOCRINE CBG (last 3)   Recent Labs  09/18/12 0340 09/18/12 0705 09/18/12 1110  GLUCAP 91 108* 114*     A:  Diabetes mellitus.  Hypothyroidism.(TSH  3.4 -7/13 )    P:   - synthroid . - start Sliding scale insulin  If needed   Global: No family at bedside 09/17/2012   PARRETT,TAMMY,NP- C Pulmonary and Critical Care Medicine [facility] Pager: [phone]  09/18/2012, 1:48 PM  STAFF NOTE: I, Dr Lavinia Sharps have personally reviewed patient's available data, including medical history, events of note, physical examination and test results as part of my evaluation. I have discussed with resident/NP and other care providers such as pharmacist, RN and RRT.  In addition,  I personally evaluated patient and elicited key findings of acute respiratory failure . Failed spontaneous breathing trial 09/18/2012. Current main issues or multi-drug-resistant Escherichia coli urinary tract infection. We'll start imipenem. In addition her CPK is only slowly clearing therefore we'll increase fluids and also give bolus. Again no family at bedside.  Rest per NP/medical resident whose note is outlined above and that I agree with  The patient is critically ill with multiple organ systems failure and requires high complexity decision making for assessment and support, frequent evaluation and titration of therapies, application of advanced monitoring technologies and extensive interpretation of multiple databases.   Critical Care Time devoted to patient care services described in this note is  31 Minutes.  Dr. Kalman Shan, M.D., [facility].C.P Pulmonary and Critical Care Medicine Staff Physician [facility]monary and Critical [facility] Pager: [phone], If no answer or between  15:00h - 7:00h: call [phone]  09/18/2012 4:10 PM

## 2012-09-18 NOTE — Progress Notes (Signed)
eLink Physician-Brief Progress Note Patient Name: [name] DOB: [birth year] MRN: [id number]  Date of Service  09/18/2012   HPI/Events of Note     eICU Interventions  Hypokalemia, repleted       [name] 09/18/2012, 5:08 AM

## 2012-09-18 NOTE — Progress Notes (Signed)
[facility] ADULT ICU REPLACEMENT PROTOCOL FOR AM LAB REPLACEMENT ONLY  The patient does not apply for the [facility] Adult ICU Electrolyte Replacment Protocol based on the criteria listed below:    2. Is urine output >/= 0.5 ml/kg/hr for the last 6 hours? no Patient's UOP is 0.3 ml/kg/hr 3. Abnormal electrolyte(s): K+3.2 5. Ordered repletion with: NA 6. If a panic level lab has been reported, has the CCM MD in charge been notified? yes.   Physician:  Edd Arbour Hilliard 09/18/2012 5:04 AM

## 2012-09-19 DIAGNOSIS — G4730 Sleep apnea, unspecified: Secondary | ICD-10-CM

## 2012-09-19 DIAGNOSIS — I69959 Hemiplegia and hemiparesis following unspecified cerebrovascular disease affecting unspecified side: Secondary | ICD-10-CM

## 2012-09-19 LAB — GLUCOSE, CAPILLARY
Glucose-Capillary: 130 mg/dL — ABNORMAL HIGH (ref 70–99)
Glucose-Capillary: 113 mg/dL — ABNORMAL HIGH (ref 70–99)
Glucose-Capillary: 110 mg/dL — ABNORMAL HIGH (ref 70–99)
Glucose-Capillary: 95 mg/dL (ref 70–99)

## 2012-09-19 LAB — COMPREHENSIVE METABOLIC PANEL
ALT: 5 U/L (ref 0–35)
AST: 10 U/L (ref 0–37)
Albumin: 1.9 g/dL — ABNORMAL LOW (ref 3.5–5.2)
Alkaline Phosphatase: 39 U/L (ref 39–117)
Potassium: 3.4 mEq/L — ABNORMAL LOW (ref 3.5–5.1)
Sodium: 148 mEq/L — ABNORMAL HIGH (ref 135–145)
Total Protein: 5.4 g/dL — ABNORMAL LOW (ref 6.0–8.3)

## 2012-09-19 LAB — URINE CULTURE: Colony Count: >=100000

## 2012-09-19 LAB — CBC
MCHC: 31.7 g/dL (ref 30.0–36.0)
RDW: 14.6 % (ref 11.5–15.5)
WBC: 6.5 10*3/uL (ref 4.0–10.5)

## 2012-09-19 LAB — LACTIC ACID, PLASMA: Lactic Acid, Venous: 1.5 mmol/L (ref 0.5–2.2)

## 2012-09-19 LAB — BLOOD GAS, ARTERIAL
Acid-Base Excess: 2 mmol/L (ref 0.0–2.0)
Drawn by: 31101
MECHVT: 400 mL
PEEP: 5 cmH2O
RATE: 18 resp/min
pCO2 arterial: 44.6 mmHg (ref 35.0–45.0)
pH, Arterial: 7.391 (ref 7.350–7.450)

## 2012-09-19 MED ORDER — ASPIRIN 81 MG PO CHEW
81.0000 mg | CHEWABLE_TABLET | Freq: Every day | ORAL | Status: DC
  Administered 2012-09-19 – 2012-09-23 (×4): 81 mg via ORAL
  Filled 2012-09-19 (×5): qty 1

## 2012-09-19 MED ORDER — SODIUM CHLORIDE 0.45 % IV SOLN: INTRAVENOUS | Status: DC

## 2012-09-19 MED ORDER — POTASSIUM CHLORIDE 20 MEQ/15ML (10%) PO LIQD
20.0000 meq | ORAL | Status: AC
  Administered 2012-09-19: 20 meq
  Filled 2012-09-19: qty 15

## 2012-09-19 MED ORDER — HEPARIN SODIUM (PORCINE) 5000 UNIT/ML IJ SOLN
5000.0000 [IU] | Freq: Three times a day (TID) | INTRAMUSCULAR | Status: DC
  Administered 2012-09-19 – 2012-09-23 (×11): 5000 [IU] via SUBCUTANEOUS
  Filled 2012-09-19 (×15): qty 1

## 2012-09-19 MED ORDER — POTASSIUM CHLORIDE 20 MEQ/15ML (10%) PO LIQD
ORAL | Status: AC
  Administered 2012-09-19: 20 meq
  Filled 2012-09-19: qty 15

## 2012-09-19 MED ORDER — POTASSIUM CHLORIDE 10 MEQ/50ML IV SOLN: 10.0000 meq | INTRAVENOUS | Status: DC

## 2012-09-19 MED ORDER — SODIUM CHLORIDE 0.45 % IV SOLN
INTRAVENOUS | Status: DC
  Administered 2012-09-19: 20 mL/h via INTRAVENOUS
  Administered 2012-09-20 – 2012-09-22 (×4): via INTRAVENOUS

## 2012-09-19 NOTE — Progress Notes (Signed)
PULMONARY  / CRITICAL CARE MEDICINE  Name: [name] MRN: [id number] DOB: August 19, 1955    ADMISSION DATE:  09/16/2012 CONSULTATION DATE:  09/16/2012  REFERRING MD :  ED PRIMARY SERVICE: CCM  CHIEF COMPLAINT:  Respiratory failure  BRIEF PATIENT DESCRIPTION: 57 yo F NH pt with likely baclofen/morphine overdose  PMHx of Hemorrhagic CVA with Lt sided weakness, chronic pain with indwelling morphine pain pump, HTN, Seizure disorder, OSA, Depression, DM, Hypothyroidism  SIGNIFICANT EVENTS / STUDIES:  7/11 >>> admit, intubated in ED with respiratory failure 7/14- pos 2.8 liters  LINES / TUBES: ETT 7/11 >>> PIV  CULTURES: 7/11 Blood >>> 7/11 Urine >>>ecoli (MDR )  ANTIBIOTICS: 7/13 Rocephin >7/13  7/13 Imipenem >>  Per [employer] representative, pt was getting 461mcg/day of baclofen prior to turning device down in ED. Device was turned down to 54mcg/day by representative, which is the lowest possible dose in order to salvage the device. On call device representative phone # is [phone].  SUBJECTIVE:  7/13> failed wean this am . Urine e colii 7/14- improved weaning   VITAL SIGNS: Temp:  [97.3 F (36.3 C)-99.3 F (37.4 C)] 98.7 F (37.1 C) (07/14 0754) Pulse Rate:  [67-86] 80 (07/14 0900) Resp:  [0-21] 17 (07/14 0900) BP: (98-151)/(51-91) 101/71 mmHg (07/14 0900) SpO2:  [96 %-100 %] 98 % (07/14 0900) FiO2 (%):  [30 %-40 %] 30 % (07/14 0852) Weight:  [96.5 kg (212 lb 11.9 oz)] 96.5 kg (212 lb 11.9 oz) (07/14 0400) HEMODYNAMICS:   VENTILATOR SETTINGS: Vent Mode:  [-] CPAP;PSV FiO2 (%):  [30 %-40 %] 30 % Set Rate:  [18 bmp] 18 bmp Vt Set:  [400 mL] 400 mL PEEP:  [5 cmH20] 5 cmH20 Pressure Support:  [15 cmH20] 15 cmH20 Plateau Pressure:  [16 cmH20-21 cmH20] 16 cmH20 INTAKE / OUTPUT: Intake/Output     07/13 0701 - 07/14 0700 07/14 0701 - 07/15 0700   I.V. (mL/kg) 666.7 (6.9) 100 (1)   NG/GT 1480 55   IV Piggyback 1450    Total Intake(mL/kg) 3596.7 (37.3) 155  (1.6)   Urine (mL/kg/hr) 865 (0.4) 185 (0.8)   Stool     Total Output 865 185   Net +2731.7 -30         PHYSICAL EXAMINATION: General: awake, eyes open Neuro: rass 0  HEENT: no line PULM: anterior clear, reduced CV: s1 s2 rrr not tachy GI: soft, BS low , nt Extremities: foot drop chronic  LABS: CXR: 7/14- no infiltrates, ett wnl   Dg Chest Port 1 View  09/18/2012   *RADIOLOGY REPORT*  Clinical Data: Acute respiratory failure on ventilator.  Morbid obesity.  PORTABLE CHEST - 1 VIEW  Comparison: 09/17/2012  Findings: Endotracheal tube and nasogastric tube remain in appropriate position.  Low lung volumes again demonstrated. Bilateral perihilar and basilar atelectasis shows no significant change.  No evidence of pulmonary consolidation or edema.  Heart size is normal.  IMPRESSION: No significant change in the bilateral subsegmental atelectasis.   Original Report Authenticated By: Myles Rosenthal, M.D.   Dg Chest Port 1 View  09/17/2012   *RADIOLOGY REPORT*  Clinical Data: Acute respiratory failure on ventilator.  PORTABLE CHEST - 1 VIEW  Comparison: 09/16/2012  Findings: Endotracheal tube and nasogastric tube remain in appropriate position.  Low lung volumes are again seen. Subsegmental atelectasis again seen in the left lung base. Previously noted opacity in the central right upper lobe is no longer visualized.  No evidence of pulmonary consolidation or edema.  Heart size  is within normal limits allowing for low lung volumes.  IMPRESSION: Resolution of right upper lobe opacity.  Persistent left lower lobe subsegmental atelectasis.   Original Report Authenticated By: Myles Rosenthal, M.D.     ASSESSMENT / PLAN:  NEUROLOGIC A:  Likely baclofen/morphine overdose. Indwelling baclofen pump. 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 History of seizures. >Baclofen rate adjusted to /day (1/2 of pt home dose) 09/16/12 at 1245.  CK elevated 834 >538  ? Baclofen  related   09/18/2012: following commands, no seizure act noted  P:   - Continue Baclofen pump running at rate of  mcg/day currently  (do not dc abruptly due to risk of rhabdo, call [employer] for help) -Continue Keppra  And depacon -WUA -limited mobility  PULMONARY  Recent Labs Lab 09/16/12 0608 09/16/12 0737 09/17/12 0349 09/18/12 0251 09/19/12 0306  PHART 7.183* 7.289* 7.450 7.505* 7.391  PCO2ART 68.3* 49.1* 33.0* 31.8* 44.6  PO2ART 221.0* 81.0 91.9 86.2 111.0*  HCO3 25.7* 23.9 22.5 24.9* 26.5*  TCO2 28 25 23.6 25.9 27.8  O2SAT 100.0 95.0 99.3 98.9 99.4  A: Hypercarbic respiratory failure likely due to morphine/baclofen OD.  Hx sleep apnea. 7/13 >failed wean/SBT , vent adjusted   P:   Improved spont breathing initiation pcxr no infiltrates, repeat in am  Wean cpsp5 ps 5, goal 30 min, may need ps increased higher Limit additional narcs abg reviewed, maintain current MV when on rest  CARDIOVASCULAR    Recent Labs Lab 09/17/12 1325 09/17/12 2230 09/18/12 0340  TROPONINI <0.30 <0.30 <0.30    Recent Labs Lab 09/18/12 0340  PROBNP 180.8*    A: Chronic hypertension, currently hypotensive likely due to baclofen overdose and narcotic overdose.>off pressors x 24hr , card enzymes neg x 3   P:  -controlled BP, tele maintain -limit gross pos balance   RENAL  Recent Labs Lab 09/16/12 0315 09/16/12 0900 09/16/12 1430 09/17/12 0420 09/18/12 0340 09/19/12 0400  NA 145  --  146* 144 143 148*  K 4.1  --  3.7 3.7 3.2* 3.4*  CL 110  --  112 113* 112 115*  CO2  --   --  21 23 23 25   GLUCOSE 115*  --  79 80 95 135*  BUN 17  --  15 15 19 21   CREATININE 1.30* 1.10 0.90 0.70 0.57 0.47*  CALCIUM  --   --  8.0* 8.2* 8.5 7.9*  MG  --   --   --  1.5 2.0 2.0  PHOS  --   --   --  2.3 3.1 4.1   A:  Acute Kidney Injury, hypokalemia, hyperchlroemia  P:   cpk was barely up, avoid such gross pos balance daily k supp Limit saline further ir NON Ag will  increase  GASTROINTESTINAL  Recent Labs Lab 09/19/12 0400  AST 10  ALT 5  ALKPHOS 39  BILITOT 0.2*  PROT 5.4*  ALBUMIN 1.9*   A:  Nutrition  P:   -  TF  Remain  -PPI   HEMATOLOGIC  Recent Labs Lab 09/17/12 0420 09/18/12 0340 09/19/12 0400  HGB 11.9* 11.1* 10.7*  HCT 36.5 34.2* 33.8*  WBC 7.7 7.2 6.5  PLT 134* 132* 124*   A:  Chronic thrombocytopenia mild P:  - Trend CBC in am  -start ASA neuro home med - dc lovenox, had ARF and muscle mass poor -sub q  hep  INFECTIOUS  Recent Labs Lab 09/18/12 0800 09/19/12  0400  LATICACIDVEN 1.3 1.5    A:  E. Coli UTI -MDR P:   - follow cx data  -Begin Imipenem 7/13 (no seiz act/ good Scr /ccl )  -will d/w IP to isolate?  ENDOCRINE CBG (last 3)   Recent Labs  09/18/12 2333 09/19/12 0337 09/19/12 0752  GLUCAP 121* 130* 113*   A:  Diabetes mellitus controlled.  Hypothyroidism.(TSH 3.4 -7/13 )  P:   - synthroid . - ssi  Global: Family daughter updated at bedside, weaning betterm, kvo  The patient is critically ill with multiple organ systems failure and requires high complexity decision making for assessment and support, frequent evaluation and titration of therapies, application of advanced monitoring technologies and extensive interpretation of multiple databases.   Critical Care Time devoted to patient care services described in this note is  30 Minutes.  Mcarthur Rossetti. Tyson Alias, MD, FACP Pgr: [phone] [place] [facility]   09/19/2012 9:17 AM

## 2012-09-19 NOTE — Progress Notes (Signed)
CRITICAL VALUE ALERT  Critical value received:  Urine Culture + ECOLI and ESBL  Date of notification:  [id number]  Time of notification:  1415  Critical value read back:yes  Nurse who received alert:  Gerre Pebbles, RN/ Relayed to [facility]  MD notified (1st page):  Dr Tyson Alias  Time of first page:  1415(Physician on unit)  MD notified (2nd page):  Time of second page:  Responding MD:  1415 (Physican on unit)  Time MD responded:  1415

## 2012-09-19 NOTE — Progress Notes (Signed)
[facility] ADULT ICU REPLACEMENT PROTOCOL FOR AM LAB REPLACEMENT ONLY  The patient does apply for the [facility] Adult ICU Electrolyte Replacment Protocol based on the criteria listed below:   1. Is GFR >/= 40 ml/min? yes  Patient's GFR today is >90 2. Is urine output >/= 0.5 ml/kg/hr for the last 6 hours? yes Patient's UOP is 0.7 ml/kg/hr 3. Is BUN < 60 mg/dL? yes  Patient's BUN today is 21 4. Abnormal electrolyte(s): K+3.4 5. Ordered repletion with: protocol 6. If a panic level lab has been reported, has the CCM MD in charge been notified? yes.   Physician:  Rise Paganini 09/19/2012 5:13 AM

## 2012-09-19 NOTE — Procedures (Signed)
Extubation Procedure Note  Patient Details:   Name: [name] DOB: 05-23-1955 MRN: [id number]   Airway Documentation:     Evaluation  O2 sats: stable throughout Complications: No apparent complications Patient did tolerate procedure well. Bilateral Breath Sounds: Coarse crackles Suctioning: Airway Yes  Patient extubated at this time and placed on 4 LNC. Postive cuff leak. SAT 97%, breathing WNL. RT will continue to monitor.  Lurlean Leyden 09/19/2012, 10:34 AM

## 2012-09-19 NOTE — Clinical Social Work Note (Signed)
Clinical Social Work Department BRIEF PSYCHOSOCIAL ASSESSMENT 09/19/2012  Patient:  [name], [name]     Account Number:  [id number]     Admit date:  09/16/2012  Clinical Social Worker:  Madaline Guthrie  Date/Time:  09/19/2012 04:40 PM  Referred by:  Care Management  Date Referred:  09/19/2012 Referred for  SNF Placement   Other Referral:   Interview type:  Family Other interview type:    PSYCHOSOCIAL DATA Living Status:  FACILITY Admitted from facility:  [facility] Level of care:  Skilled Nursing Facility Primary support name:  Christel Mormon [phone] Primary support relationship to patient:  CHILD, ADULT Degree of support available:   good    CURRENT CONCERNS Current Concerns  Post-Acute Placement   Other Concerns:    SOCIAL WORK ASSESSMENT / PLAN Pt was admitted from [facility].  CSW called the facility [phone]) and confirmed that she will have a bed there when ready for discharge.  CSW also talked to pt's daughter who states that the plan is for pt to return to SNF when ready.  CSW updated FL2.   Assessment/plan status:  Other - See comment Other assessment/ plan:   Facilitate pt's return to SNF.   Information/referral to community resources:    PATIENT'S/FAMILY'S RESPONSE TO PLAN OF CARE: Family in agreement with pt's return to [facility] at d/c.

## 2012-09-19 NOTE — Progress Notes (Signed)
UR Completed.  Railyn House Jane [phone] 09/19/2012  

## 2012-09-20 ENCOUNTER — Inpatient Hospital Stay (HOSPITAL_COMMUNITY): Payer: PRIVATE HEALTH INSURANCE

## 2012-09-20 LAB — BASIC METABOLIC PANEL
Calcium: 8.5 mg/dL (ref 8.4–10.5)
GFR calc Af Amer: >90 mL/min (ref >90)
GFR calc non Af Amer: >90 mL/min (ref >90)
Potassium: 3.6 mEq/L (ref 3.5–5.1)
Sodium: 150 mEq/L — ABNORMAL HIGH (ref 135–145)

## 2012-09-20 LAB — GLUCOSE, CAPILLARY
Glucose-Capillary: 103 mg/dL — ABNORMAL HIGH (ref 70–99)
Glucose-Capillary: 107 mg/dL — ABNORMAL HIGH (ref 70–99)
Glucose-Capillary: 75 mg/dL (ref 70–99)
Glucose-Capillary: 80 mg/dL (ref 70–99)
Glucose-Capillary: 88 mg/dL (ref 70–99)
Glucose-Capillary: 92 mg/dL (ref 70–99)
Glucose-Capillary: 94 mg/dL (ref 70–99)

## 2012-09-20 LAB — POCT I-STAT 3, ART BLOOD GAS (G3+): pH, Arterial: 7.337 — ABNORMAL LOW (ref 7.350–7.450)

## 2012-09-20 LAB — URINALYSIS, ROUTINE W REFLEX MICROSCOPIC
Glucose, UA: NEGATIVE mg/dL
Ketones, ur: NEGATIVE mg/dL
Protein, ur: NEGATIVE mg/dL
pH: 6 (ref 5.0–8.0)

## 2012-09-20 LAB — URINE MICROSCOPIC-ADD ON

## 2012-09-20 LAB — CBC WITH DIFFERENTIAL/PLATELET
Basophils Relative: 0 % (ref 0–1)
Eosinophils Absolute: 0.1 10*3/uL (ref 0.0–0.7)
Eosinophils Relative: 3 % (ref 0–5)
MCH: 30.3 pg (ref 26.0–34.0)
MCHC: 31.8 g/dL (ref 30.0–36.0)
MCV: 95.1 fL (ref 78.0–100.0)
Neutrophils Relative %: 31 % — ABNORMAL LOW (ref 43–77)
Platelets: 123 10*3/uL — ABNORMAL LOW (ref 150–400)
RBC: 3.7 MIL/uL — ABNORMAL LOW (ref 3.87–5.11)
RDW: 14.4 % (ref 11.5–15.5)

## 2012-09-20 MED ORDER — OLOPATADINE HCL 0.1 % OP SOLN
1.0000 [drp] | Freq: Two times a day (BID) | OPHTHALMIC | Status: DC
  Administered 2012-09-20 – 2012-09-23 (×6): 1 [drp] via OPHTHALMIC
  Filled 2012-09-20: qty 5

## 2012-09-20 MED ORDER — PANTOPRAZOLE SODIUM 40 MG PO TBEC
40.0000 mg | DELAYED_RELEASE_TABLET | Freq: Every day | ORAL | Status: DC
  Administered 2012-09-20 – 2012-09-23 (×4): 40 mg via ORAL
  Filled 2012-09-20 (×4): qty 1

## 2012-09-20 MED ORDER — SIMVASTATIN 20 MG PO TABS
20.0000 mg | ORAL_TABLET | Freq: Every day | ORAL | Status: DC
  Administered 2012-09-20 – 2012-09-22 (×3): 20 mg via ORAL
  Filled 2012-09-20 (×4): qty 1

## 2012-09-20 MED ORDER — DULOXETINE HCL 60 MG PO CPEP
60.0000 mg | ORAL_CAPSULE | Freq: Every day | ORAL | Status: DC
  Administered 2012-09-20 – 2012-09-23 (×3): 60 mg via ORAL
  Filled 2012-09-20 (×4): qty 1

## 2012-09-20 MED ORDER — DEXTROSE 5 % IV SOLN: INTRAVENOUS | Status: DC

## 2012-09-20 MED ORDER — CLONIDINE HCL 0.1 MG/24HR TD PTWK
0.1000 mg | MEDICATED_PATCH | TRANSDERMAL | Status: DC
  Administered 2012-09-20: 0.1 mg via TRANSDERMAL
  Filled 2012-09-20: qty 1

## 2012-09-20 MED ORDER — DIVALPROEX SODIUM 500 MG PO DR TAB
500.0000 mg | DELAYED_RELEASE_TABLET | Freq: Two times a day (BID) | ORAL | Status: DC
  Administered 2012-09-20 – 2012-09-23 (×5): 500 mg via ORAL
  Filled 2012-09-20 (×7): qty 1

## 2012-09-20 MED ORDER — VALPROIC ACID 250 MG/5ML PO SYRP
500.0000 mg | ORAL_SOLUTION | Freq: Two times a day (BID) | ORAL | Status: DC
  Filled 2012-09-20: qty 10

## 2012-09-20 NOTE — Progress Notes (Signed)
Patient taken off CPAP at this time, and placed on 2 LNC. RN stated he felt like she may benefit from BiPAP with a BUR at night due to apnea. Will consult with MD. RT will continue to monitor.

## 2012-09-20 NOTE — Progress Notes (Signed)
Patient placed on CPAP auto titrate mode with 2L oxygen bled in.  Patient wears CPAP at nursing home. Patient tolerating well at this time.  RT will continue to monitor patient.

## 2012-09-20 NOTE — Progress Notes (Signed)
PULMONARY  / CRITICAL CARE MEDICINE  Name: [name] MRN: [id number] DOB: January 11, 1956    ADMISSION DATE:  09/16/2012 CONSULTATION DATE:  09/16/2012  REFERRING MD :  ED PRIMARY SERVICE: CCM  CHIEF COMPLAINT:  Respiratory failure  BRIEF PATIENT DESCRIPTION: 57 yo F NH pt with likely baclofen/morphine overdose, VDRF.  SIGNIFICANT EVENTS / STUDIES:  7/11 >>> admit, intubated in ED with respiratory failure 7/14- pos 2.8 liters, extubated  LINES / TUBES: ETT 7/11 >>>7/14 PIV  CULTURES: 7/11 Blood >>> 7/11 Urine >>>ecoli (MDR, ESBL )  ANTIBIOTICS: 7/13 Rocephin >7/13  7/13 Imipenem >>>7/22 planned  Per [employer] representative, pt was getting 412mcg/day of baclofen prior to turning device down in ED. Device was turned down to 66mcg/day by representative, which is the lowest possible dose in order to salvage the device. On call device representative phone # is [phone].  SUBJECTIVE:  Extubated, no distress   VITAL SIGNS: Temp:  [97.7 F (36.5 C)-98.8 F (37.1 C)] 97.7 F (36.5 C) (07/15 0417) Pulse Rate:  [56-80] 64 (07/15 0700) Resp:  [7-21] 16 (07/15 0700) BP: (101-162)/(61-90) 162/87 mmHg (07/15 0700) SpO2:  [93 %-100 %] 95 % (07/15 0700) FiO2 (%):  [30 %] 30 % (07/14 0929) Weight:  [96.6 kg (212 lb 15.4 oz)] 96.6 kg (212 lb 15.4 oz) (07/15 0500) HEMODYNAMICS:   VENTILATOR SETTINGS: Vent Mode:  [-] CPAP;PSV FiO2 (%):  [30 %] 30 % PEEP:  [5 cmH20] 5 cmH20 Pressure Support:  [15 cmH20] 15 cmH20 INTAKE / OUTPUT: Intake/Output     07/14 0701 - 07/15 0700 07/15 0701 - 07/16 0700   P.O. 80    I.V. (mL/kg) 430 (4.5)    NG/GT 230    IV Piggyback 450    Total Intake(mL/kg) 1190 (12.3)    Urine (mL/kg/hr) 925 (0.4)    Total Output 925     Net +265           PHYSICAL EXAMINATION: General: awake, alert, weakness chornic left Neuro: rass 0 HEENT: no line PULM: CTA CV: s1 s2 rrr GI: soft, BS low , nt Extremities: foot drop chronic  LABS: CXR: 7/14-  no infiltrates, ett wnl   Dg Chest Port 1 View  09/20/2012   *RADIOLOGY REPORT*  Clinical Data: Evaluate endotracheal tube placement.  PORTABLE CHEST - 1 VIEW  Comparison: Chest x-ray 09/18/2012.  Findings: The patient has been extubated.  Linear opacities throughout the left mid and lower lung favored to predominately reflect subsegmental atelectasis, although underlying airspace consolidation in the medial aspect of the left lower lobe is not excluded.  Possible trace left pleural effusion.  No evidence of pulmonary edema.  Heart size is within normal limits. The patient is rotated to the right on today's exam, resulting in distortion of the mediastinal contours and reduced diagnostic sensitivity and specificity for mediastinal pathology.  IMPRESSION: 1.  Interval extubation of the patient. 2.  Persistent opacities at the left base may reflect areas of atelectasis and/or consolidation, with superimposed small left pleural effusion.   Original Report Authenticated By: Trudie Reed, M.D.     ASSESSMENT / PLAN:  NEUROLOGIC A:  Likely baclofen/morphine overdose. Indwelling baclofen pump. 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 History of seizures. >Baclofen rate adjusted to /day (1/2 of pt home dose) 09/16/12 at 1245.  CK elevated 834 >538  ? Baclofen related   09/18/2012: following commands, no seizure act noted  P:   - Continue Baclofen pump running at  rate of  mcg/day currently  (do not dc abruptly due to risk of rhabdo, call [employer] for help) -Continue Keppra  And depacon -limited mobility  PULMONARY  Recent Labs Lab 09/16/12 0737 09/17/12 0349 09/18/12 0251 09/19/12 0306 09/20/12 0358  PHART 7.289* 7.450 7.505* 7.391 7.337*  PCO2ART 49.1* 33.0* 31.8* 44.6 59.8*  PO2ART 81.0 91.9 86.2 111.0* 127.0*  HCO3 23.9 22.5 24.9* 26.5* 32.1*  TCO2 25 23.6 25.9 27.8 34  O2SAT 95.0 99.3 98.9 99.4 99.0  A: Hypercarbic respiratory  failure likely due to morphine/baclofen OD.  Hx sleep apnea. 7/13 >failed wean/SBT , vent adjusted   P:   IS, mild atx pcxr  CARDIOVASCULAR    Recent Labs Lab 09/17/12 1325 09/17/12 2230 09/18/12 0340  TROPONINI <0.30 <0.30 <0.30    Recent Labs Lab 09/18/12 0340  PROBNP 180.8*    A: Chronic hypertension, card enzymes neg x 3   P:  -controlled BP, tele maintain -limit gross pos balance -tele dc sys rising, add home regimen, clonidone first   RENAL  Recent Labs Lab 09/16/12 0315 09/16/12 0900 09/16/12 1430 09/17/12 0420 09/18/12 0340 09/19/12 0400  NA 145  --  146* 144 143 148*  K 4.1  --  3.7 3.7 3.2* 3.4*  CL 110  --  112 113* 112 115*  CO2  --   --  21 23 23 25   GLUCOSE 115*  --  79 80 95 135*  BUN 17  --  15 15 19 21   CREATININE 1.30* 1.10 0.90 0.70 0.57 0.47*  CALCIUM  --   --  8.0* 8.2* 8.5 7.9*  MG  --   --   --  1.5 2.0 2.0  PHOS  --   --   --  2.3 3.1 4.1   A:  Acute Kidney Injury, hypokalemia, hyperchlroemia, hypernatremia  P:  Change fluid to d5w at 50 , chem in am   GASTROINTESTINAL  Recent Labs Lab 09/19/12 0400  AST 10  ALT 5  ALKPHOS 39  BILITOT 0.2*  PROT 5.4*  ALBUMIN 1.9*   A:  Nutrition  P:   -add dm diet -PPI , if not at home ,dc  HEMATOLOGIC  Recent Labs Lab 09/18/12 0340 09/19/12 0400 09/20/12 0715  HGB 11.1* 10.7* 11.2*  HCT 34.2* 33.8* 35.2*  WBC 7.2 6.5 4.0  PLT 132* 124* 123*   A:  Chronic thrombocytopenia mild P:  -ASA neuro home med -sub q  hep  INFECTIOUS  Recent Labs Lab 09/18/12 0800 09/19/12 0400  LATICACIDVEN 1.3 1.5    A:  E. Coli UTI -MDR P:   -Begin Imipenem 7/13 (no seiz act/ good Scr /ccl ) - add stop date UA repeat to ensure foley not need dc  ENDOCRINE CBG (last 3)   Recent Labs  09/19/12 2017 09/19/12 2346 09/20/12 0416  GLUCAP 95 88 80   A:  Diabetes mellitus controlled.  Hypothyroidism.(TSH 3.4 -7/13 )  P:   - synthroid . - ssi folow closely as adding  d5w  Global: d5w, add stop date abx, to floor, [facility]   Mcarthur Rossetti. Tyson Alias, MD, FACP Pgr: [phone] [facility]   09/20/2012 8:31 AM

## 2012-09-21 ENCOUNTER — Encounter (HOSPITAL_COMMUNITY): Payer: Self-pay | Admitting: General Practice

## 2012-09-21 DIAGNOSIS — Z5189 Encounter for other specified aftercare: Secondary | ICD-10-CM

## 2012-09-21 DIAGNOSIS — G40909 Epilepsy, unspecified, not intractable, without status epilepticus: Secondary | ICD-10-CM

## 2012-09-21 DIAGNOSIS — E039 Hypothyroidism, unspecified: Secondary | ICD-10-CM

## 2012-09-21 LAB — URINE CULTURE
Colony Count: NO GROWTH
Culture: NO GROWTH

## 2012-09-21 LAB — BASIC METABOLIC PANEL
Calcium: 8.6 mg/dL (ref 8.4–10.5)
Chloride: 109 mEq/L (ref 96–112)
Creatinine, Ser: 0.54 mg/dL (ref 0.50–1.10)
GFR calc Af Amer: >90 mL/min (ref >90)
GFR calc non Af Amer: >90 mL/min (ref >90)

## 2012-09-21 LAB — GLUCOSE, CAPILLARY
Glucose-Capillary: 97 mg/dL (ref 70–99)
Glucose-Capillary: 102 mg/dL — ABNORMAL HIGH (ref 70–99)

## 2012-09-21 MED ORDER — PNEUMOCOCCAL VAC POLYVALENT 25 MCG/0.5ML IJ INJ
0.5000 mL | INJECTION | INTRAMUSCULAR | Status: AC
  Filled 2012-09-21: qty 0.5

## 2012-09-21 MED ORDER — LABETALOL HCL 200 MG PO TABS
200.0000 mg | ORAL_TABLET | Freq: Two times a day (BID) | ORAL | Status: DC
  Administered 2012-09-22 – 2012-09-23 (×2): 200 mg via ORAL
  Filled 2012-09-21 (×5): qty 1

## 2012-09-21 MED ORDER — HYDROXYZINE HCL 25 MG PO TABS
12.5000 mg | ORAL_TABLET | Freq: Every day | ORAL | Status: DC | PRN
  Filled 2012-09-21: qty 1

## 2012-09-21 MED ORDER — AMLODIPINE BESYLATE 5 MG PO TABS
5.0000 mg | ORAL_TABLET | Freq: Every day | ORAL | Status: DC
  Administered 2012-09-21 – 2012-09-23 (×2): 5 mg via ORAL
  Filled 2012-09-21 (×3): qty 1

## 2012-09-21 NOTE — Progress Notes (Signed)
ANTIBIOTIC CONSULT NOTE - FOLLOW UP  Pharmacy Consult for Primaxin Indication: ESBL UTI  Allergies  Allergen Reactions  . Codeine Other (See Comments)    unknown    Patient Measurements: Height: 5\' 2"  (157.5 cm) Weight: 213 lb 10 oz (96.9 kg) IBW/kg (Calculated) : 50.1  Vital Signs: Temp: 97.8 F (36.6 C) (07/16 0536) Temp src: Oral (07/16 0536) BP: 167/101 mmHg (07/16 0536) Pulse Rate: 65 (07/16 0536) Intake/Output from previous day: 07/15 0701 - 07/16 0700 In: 1214 [P.O.:944; I.V.:170; IV Piggyback:100] Out: 1055 [Urine:1055] Intake/Output from this shift:    Labs:  Recent Labs  09/19/12 0400 09/20/12 0715 09/21/12 0704  WBC 6.5 4.0  --   HGB 10.7* 11.2*  --   PLT 124* 123*  --   CREATININE 0.47* 0.47* 0.54   Estimated Creatinine Clearance: 84.3 ml/min (by C-G formula based on Cr of 0.54).    Microbiology: Recent Results (from the past 720 hour(s))  URINE CULTURE     Status: None   Collection Time    09/16/12  3:57 AM      Result Value Range Status   Specimen Description URINE, CATHETERIZED   Final   Special Requests NONE   Final   Culture  Setup Time 09/16/2012 08:34   Final   Colony Count >=100,000 COLONIES/ML   Final   Culture ESCHERICHIA COLI   Final   Report Status 09/18/2012 FINAL   Final   Organism ID, Bacteria ESCHERICHIA COLI   Final  MRSA PCR SCREENING     Status: None   Collection Time    09/16/12  8:17 AM      Result Value Range Status   MRSA by PCR NEGATIVE  NEGATIVE Final   Comment:            The GeneXpert MRSA Assay (FDA     approved for NASAL specimens     only), is one component of a     comprehensive MRSA colonization     surveillance program. It is not     intended to diagnose MRSA     infection nor to guide or     monitor treatment for     MRSA infections.  CULTURE, BLOOD (ROUTINE X 2)     Status: None   Collection Time    09/16/12  9:40 AM      Result Value Range Status   Specimen Description BLOOD RIGHT ARM   Final    Special Requests BOTTLES DRAWN AEROBIC ONLY 6CC   Final   Culture  Setup Time 09/16/2012 14:00   Final   Culture     Final   Value:        BLOOD CULTURE RECEIVED NO GROWTH TO DATE CULTURE WILL BE HELD FOR 5 DAYS BEFORE ISSUING A FINAL NEGATIVE REPORT   Report Status PENDING   Incomplete  CULTURE, BLOOD (ROUTINE X 2)     Status: None   Collection Time    09/16/12 12:25 PM      Result Value Range Status   Specimen Description BLOOD RIGHT HAND   Final   Special Requests BOTTLES DRAWN AEROBIC ONLY 3CC   Final   Culture  Setup Time 09/16/2012 16:21   Final   Culture     Final   Value:        BLOOD CULTURE RECEIVED NO GROWTH TO DATE CULTURE WILL BE HELD FOR 5 DAYS BEFORE ISSUING A FINAL NEGATIVE REPORT   Report Status PENDING   Incomplete  URINE CULTURE     Status: None   Collection Time    09/16/12  7:51 PM      Result Value Range Status   Specimen Description URINE, CATHETERIZED   Final   Special Requests Normal   Final   Culture  Setup Time 09/17/2012 15:05   Final   Colony Count >=100,000 COLONIES/ML   Final   Culture     Final   Value: ESCHERICHIA COLI     Note: Confirmed Extended Spectrum Beta-Lactamase Producer (ESBL) CRITICAL RESULT CALLED TO, READ BACK BY AND VERIFIED WITH: MAVIS N@2 :15PM ON 09/19/12 BY DANTS   Report Status 09/19/2012 FINAL   Final   Organism ID, Bacteria ESCHERICHIA COLI   Final    Anti-infectives   Start     Dose/Rate Route Frequency Ordered Stop   09/18/12 1415  imipenem-cilastatin (PRIMAXIN) 500 mg in sodium chloride 0.9 % 100 mL IVPB     500 mg 200 mL/hr over 30 Minutes Intravenous 4 times per day 09/18/12 1404 09/27/12 2359   09/18/12 1200  cefTRIAXone (ROCEPHIN) 1 g in dextrose 5 % 50 mL IVPB  Status:  Discontinued     1 g 100 mL/hr over 30 Minutes Intravenous Every 24 hours 09/18/12 1135 09/18/12 1403      Assessment: 57 yo F admitted with respiratory failure related to baclofen/morphine overdose from SQ pump.  Found to also have ESBL E Coli  UTI and has started on Primaxin.  Today is day #4 of 10 planned days of treatment.  WBC wnl, afeb, SCr stable.  Goal of Therapy:  Eradication of Infection Renal dose adjustment   Plan:  Continue Primaxin 500 mg IV q6h  [employer], Pharm.D., BCPS Clinical Pharmacist Pager [phone] 09/21/2012 11:44 AM

## 2012-09-21 NOTE — Progress Notes (Signed)
PATIENT DETAILS Name: [name] Age: 57 y.o. Sex: female Date of Birth: 1956-01-12 Admit Date: 09/16/2012 Admitting Physician Lupita Leash, MD [id number] Kellie Shropshire, MD  Subjective: No major issues.  Assessment/Plan: Principal Problem:   Acute Hypercarbic respiratory failure -Hypercarbic respiratory failure likely due to morphine/baclofen OD.  -Intubated on 7/11-extubated on 7/14 room air -Continue Baclofen pump running at rate of mcg/day currently  Active Problems:   Narcotic/Baclofen overdose -Indwelling baclofen pump. History of CVA with residual left sided paralysis. Chronic pain and spasticity -Baclofen rate adjusted to /day (1/2 of pt home dose) 09/16/12 -minimize narcotics  ESBL E Coli UTI -On day 4-will change to Invanz on 7/17-will see if SNF can take her without a PICC line and complete a total of 10 days of therapy  Hypernatremia -improving with IVF -encourage PO intake  ARF -resolved with IVF  HTN -moderately controlled -c/w Clonidine transdermally -resume Amlodipine and Labetalol at prior dose  Dyslipidemia -c/w Statins  Hypothyroidism -c/w Levothyroxine  Hx of Seizure Disorder -c/w Keppra and Depakote  OSA -CPAP QHS  History of stroke -hemiplegic on left side -ASA, Statins    Disposition: Remain inpatient  DVT Prophylaxis: Prophylactic Heparin   Code Status: Full code   Family Communication None at bedside  Procedures:  None  CONSULTS:  None   MEDICATIONS: Scheduled Meds: . aspirin  81 mg Oral Daily  . cloNIDine  0.1 mg Transdermal Weekly  . divalproex  500 mg Oral BID  . DULoxetine  60 mg Oral Daily  . feeding supplement  30 mL Per Tube 5 X Daily  . heparin subcutaneous  5,000 Units Subcutaneous Q8H  . imipenem-cilastatin  500 mg Intravenous Q6H  . levETIRAcetam  2,000 mg Oral BID  . levothyroxine  68.5 mcg Per Tube QAC breakfast  . olopatadine  1 drop Both Eyes BID  . pantoprazole  40 mg  Oral Q1200  . [START ON 09/22/2012] pneumococcal 23 valent vaccine  0.5 mL Intramuscular Tomorrow-1000  . simvastatin  20 mg Oral q1800   Continuous Infusions: . sodium chloride 75 mL/hr at 09/21/12 1228  . baclofen 40 mg (09/16/12 1330)   PRN Meds:.  Antibiotics: Anti-infectives   Start     Dose/Rate Route Frequency Ordered Stop   09/18/12 1415  imipenem-cilastatin (PRIMAXIN) 500 mg in sodium chloride 0.9 % 100 mL IVPB     500 mg 200 mL/hr over 30 Minutes Intravenous 4 times per day 09/18/12 1404 09/27/12 2359   09/18/12 1200  cefTRIAXone (ROCEPHIN) 1 g in dextrose 5 % 50 mL IVPB  Status:  Discontinued     1 g 100 mL/hr over 30 Minutes Intravenous Every 24 hours 09/18/12 1135 09/18/12 1403       PHYSICAL EXAM: Vital signs in last 24 hours: Filed Vitals:   09/20/12 1400 09/20/12 1546 09/20/12 2128 09/21/12 0536  BP: 168/95 140/98 153/94 167/101  Pulse: 68 69 77 65  Temp:  98.1 F (36.7 C) 98.3 F (36.8 C) 97.8 F (36.6 C)  TempSrc:  Oral Oral Oral  Resp: 21 20 18 20   Height:      Weight:    96.9 kg (213 lb 10 oz)  SpO2: 97% 99% 100% 100%    Weight change: 0.3 kg (10.6 oz) Filed Weights   09/19/12 0400 09/20/12 0500 09/21/12 0536  Weight: 96.5 kg (212 lb 11.9 oz) 96.6 kg (212 lb 15.4 oz) 96.9 kg (213 lb 10 oz)   Body mass index is 39.06 kg/(m^2).  Gen Exam: Awake and alert    Neck: Supple, No JVD.   Chest: B/L Clear.   CVS: S1 S2 Regular, no murmurs.  Abdomen: soft, BS +, non tender, non distended.  Extremities: no edema, lower extremities warm to touch. Neurologic: Left hemiplegic at baseline Skin: No Rash.   Wounds: N/A.    Intake/Output from previous day:  Intake/Output Summary (Last 24 hours) at 09/21/12 1514 Last data filed at 09/21/12 0548  Gross per 24 hour  Intake    524 ml  Output    500 ml  Net     24 ml     LAB RESULTS: CBC  Recent Labs Lab 09/16/12 0300  09/16/12 0900 09/17/12 0420 09/18/12 0340 09/19/12 0400 09/20/12 0715  WBC  8.0  --  8.8 7.7 7.2 6.5 4.0  HGB 12.8  < > 11.9* 11.9* 11.1* 10.7* 11.2*  HCT 40.9  < > 38.4 36.5 34.2* 33.8* 35.2*  PLT 143*  --  137* 134* 132* 124* 123*  MCV 96.2  --  97.5 93.1 91.9 94.9 95.1  MCH 30.1  --  30.2 30.4 29.8 30.1 30.3  MCHC 31.3  --  31.0 32.6 32.5 31.7 31.8  RDW 15.0  --  14.8 14.5 14.5 14.6 14.4  LYMPHSABS 2.6  --   --   --   --   --  2.3  MONOABS 0.9  --   --   --   --   --  0.4  EOSABS 0.1  --   --   --   --   --  0.1  BASOSABS 0.0  --   --   --   --   --  0.0  < > = values in this interval not displayed.  Chemistries   Recent Labs Lab 09/17/12 0420 09/18/12 0340 09/19/12 0400 09/20/12 0715 09/21/12 0704  NA 144 143 148* 150* 145  K 3.7 3.2* 3.4* 3.6 3.8  CL 113* 112 115* 112 109  CO2 23 23 25 29  33*  GLUCOSE 80 95 135* 78 105*  BUN 15 19 21 17 16   CREATININE 0.70 0.57 0.47* 0.47* 0.54  CALCIUM 8.2* 8.5 7.9* 8.5 8.6  MG 1.5 2.0 2.0  --   --     CBG:  Recent Labs Lab 09/20/12 2009 09/20/12 2352 09/21/12 0407 09/21/12 0735 09/21/12 1250  GLUCAP 107* 103* 97 102* 109*    GFR Estimated Creatinine Clearance: 84.3 ml/min (by C-G formula based on Cr of 0.54).  Coagulation profile No results found for this basename: INR, PROTIME,  in the last 168 hours  Cardiac Enzymes  Recent Labs Lab 09/17/12 1325 09/17/12 2230 09/18/12 0340  TROPONINI <0.30 <0.30 <0.30    No components found with this basename: POCBNP,  No results found for this basename: DDIMER,  in the last 72 hours No results found for this basename: HGBA1C,  in the last 72 hours No results found for this basename: CHOL, HDL, LDLCALC, TRIG, CHOLHDL, LDLDIRECT,  in the last 72 hours No results found for this basename: TSH, T4TOTAL, FREET3, T3FREE, THYROIDAB,  in the last 72 hours No results found for this basename: VITAMINB12, FOLATE, FERRITIN, TIBC, IRON, RETICCTPCT,  in the last 72 hours No results found for this basename: LIPASE, AMYLASE,  in the last 72 hours  Urine  Studies No results found for this basename: UACOL, UAPR, USPG, UPH, UTP, UGL, UKET, UBIL, UHGB, UNIT, UROB, ULEU, UEPI, UWBC, URBC, UBAC, CAST, CRYS, UCOM, BILUA,  in  the last 72 hours  MICROBIOLOGY: Recent Results (from the past 240 hour(s))  URINE CULTURE     Status: None   Collection Time    09/16/12  3:57 AM      Result Value Range Status   Specimen Description URINE, CATHETERIZED   Final   Special Requests NONE   Final   Culture  Setup Time 09/16/2012 08:34   Final   Colony Count >=100,000 COLONIES/ML   Final   Culture ESCHERICHIA COLI   Final   Report Status 09/18/2012 FINAL   Final   Organism ID, Bacteria ESCHERICHIA COLI   Final  MRSA PCR SCREENING     Status: None   Collection Time    09/16/12  8:17 AM      Result Value Range Status   MRSA by PCR NEGATIVE  NEGATIVE Final   Comment:            The GeneXpert MRSA Assay (FDA     approved for NASAL specimens     only), is one component of a     comprehensive MRSA colonization     surveillance program. It is not     intended to diagnose MRSA     infection nor to guide or     monitor treatment for     MRSA infections.  CULTURE, BLOOD (ROUTINE X 2)     Status: None   Collection Time    09/16/12  9:40 AM      Result Value Range Status   Specimen Description BLOOD RIGHT ARM   Final   Special Requests BOTTLES DRAWN AEROBIC ONLY 6CC   Final   Culture  Setup Time 09/16/2012 14:00   Final   Culture     Final   Value:        BLOOD CULTURE RECEIVED NO GROWTH TO DATE CULTURE WILL BE HELD FOR 5 DAYS BEFORE ISSUING A FINAL NEGATIVE REPORT   Report Status PENDING   Incomplete  CULTURE, BLOOD (ROUTINE X 2)     Status: None   Collection Time    09/16/12 12:25 PM      Result Value Range Status   Specimen Description BLOOD RIGHT HAND   Final   Special Requests BOTTLES DRAWN AEROBIC ONLY 3CC   Final   Culture  Setup Time 09/16/2012 16:21   Final   Culture     Final   Value:        BLOOD CULTURE RECEIVED NO GROWTH TO DATE CULTURE  WILL BE HELD FOR 5 DAYS BEFORE ISSUING A FINAL NEGATIVE REPORT   Report Status PENDING   Incomplete  URINE CULTURE     Status: None   Collection Time    09/16/12  7:51 PM      Result Value Range Status   Specimen Description URINE, CATHETERIZED   Final   Special Requests Normal   Final   Culture  Setup Time 09/17/2012 15:05   Final   Colony Count >=100,000 COLONIES/ML   Final   Culture     Final   Value: ESCHERICHIA COLI     Note: Confirmed Extended Spectrum Beta-Lactamase Producer (ESBL) CRITICAL RESULT CALLED TO, READ BACK BY AND VERIFIED WITH: MAVIS N@2 :15PM ON 09/19/12 BY DANTS   Report Status 09/19/2012 FINAL   Final   Organism ID, Bacteria ESCHERICHIA COLI   Final    RADIOLOGY STUDIES/RESULTS: Ct Head Wo Contrast  09/16/2012   *RADIOLOGY REPORT*  Clinical Data: Drug overdose.  Altered mental status.  CT HEAD WITHOUT CONTRAST  Technique:  Contiguous axial images were obtained from the base of the skull through the vertex without contrast.  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  Low attenuation change along the right frontal parietal and temporal deep white matter consistent with old infarct.  Old lacunar infarcts in the left basal ganglia.  Old infarct and calcification in the deep white matter in the left anterior frontal region.  Ventricular dilatation likely related to contraction of the encephalomalacia. No acute mass effect or midline shift.  No abnormal extra-axial fluid collections.  Gray-white matter junctions are distinct. Basal cisterns are not effaced.  No evidence of acute intracranial hemorrhage.  No depressed skull fractures.  Visualized paranasal sinuses and mastoid air cells are not opacified.  No significant changes since the previous study.  IMPRESSION: No evidence of acute intracranial abnormality.  Chronic changes including aneurysm clip and old bilateral deep white matter infarcts.   Original Report Authenticated By: Burman Nieves,  M.D.   Dg Chest Port 1 View  09/20/2012   *RADIOLOGY REPORT*  Clinical Data: Evaluate endotracheal tube placement.  PORTABLE CHEST - 1 VIEW  Comparison: Chest x-ray 09/18/2012.  Findings: The patient has been extubated.  Linear opacities throughout the left mid and lower lung favored to predominately reflect subsegmental atelectasis, although underlying airspace consolidation in the medial aspect of the left lower lobe is not excluded.  Possible trace left pleural effusion.  No evidence of pulmonary edema.  Heart size is within normal limits. The patient is rotated to the right on today's exam, resulting in distortion of the mediastinal contours and reduced diagnostic sensitivity and specificity for mediastinal pathology.  IMPRESSION: 1.  Interval extubation of the patient. 2.  Persistent opacities at the left base may reflect areas of atelectasis and/or consolidation, with superimposed small left pleural effusion.   Original Report Authenticated By: Trudie Reed, M.D.   Dg Chest Port 1 View  09/18/2012   *RADIOLOGY REPORT*  Clinical Data: Acute respiratory failure on ventilator.  Morbid obesity.  PORTABLE CHEST - 1 VIEW  Comparison: 09/17/2012  Findings: Endotracheal tube and nasogastric tube remain in appropriate position.  Low lung volumes again demonstrated. Bilateral perihilar and basilar atelectasis shows no significant change.  No evidence of pulmonary consolidation or edema.  Heart size is normal.  IMPRESSION: No significant change in the bilateral subsegmental atelectasis.   Original Report Authenticated By: Myles Rosenthal, M.D.   Dg Chest Port 1 View  09/17/2012   *RADIOLOGY REPORT*  Clinical Data: Acute respiratory failure on ventilator.  PORTABLE CHEST - 1 VIEW  Comparison: 09/16/2012  Findings: Endotracheal tube and nasogastric tube remain in appropriate position.  Low lung volumes are again seen. Subsegmental atelectasis again seen in the left lung base. Previously noted opacity in the central  right upper lobe is no longer visualized.  No evidence of pulmonary consolidation or edema.  Heart size is within normal limits allowing for low lung volumes.  IMPRESSION: Resolution of right upper lobe opacity.  Persistent left lower lobe subsegmental atelectasis.   Original Report Authenticated By: Myles Rosenthal, M.D.   Dg Chest Portable 1 View  09/16/2012   *RADIOLOGY REPORT*  Clinical Data: Drug overdose.  PORTABLE CHEST - 1 VIEW  Comparison: 08/06/2012  Findings: Endotracheal tube has been placed.  The tip measures about 1.9 cm above the carina.  Enteric tube tip is in the left upper quadrant consistent with location in the distal stomach. Shallow inspiration.  Atelectasis or infiltration  in the left lung base and right upper lung.  Suggestion of perihilar infiltration on the left.  No pneumothorax.  No blunting of costophrenic angles.  IMPRESSION: Appliances appear to be satisfactory location.  Shallow inspiration.  Infiltration or atelectasis in the right upper lung and left lower lung with possible left perihilar infiltrates.   Original Report Authenticated By: Burman Nieves, M.D.    Jeoffrey Massed, MD  [facility] Pager:[phone]  If 7PM-7AM, please contact night-coverage [email or web address] Password [email or web address] 09/21/2012, 3:14 PM   LOS: 5 days

## 2012-09-21 NOTE — Progress Notes (Signed)
PT Cancellation Note  Patient Details Name: [name] MRN: [id number] DOB: 05-23-1955   Cancelled Treatment:    Reason Eval/Treat Not Completed: Fatigue/lethargy limiting ability to participate Pt sleeping upon entering and did not awaken to verbal or tactile stimulation.   Lataysha Vohra,KATHrine E 09/21/2012, 3:27 PM Zenovia Jarred, PT, DPT 09/21/2012 Pager: [phone]

## 2012-09-21 NOTE — Care Management Note (Unsigned)
    Page 1 of 1   09/22/2012     3:50:59 PM   CARE MANAGEMENT NOTE 09/22/2012  Patient:  [name], [name]   Account Number:  [id number]  Date Initiated:  09/16/2012  Documentation initiated by:  Avie Arenas  Subjective/Objective Assessment:   Admitted with Resp failure - requiring intubation.     Action/Plan:   Anticipated DC Date:  09/22/2012   Anticipated DC Plan:  SKILLED NURSING FACILITY  In-house referral  Clinical Social Worker      DC Planning Services  CM consult      Choice offered to / List presented to:             Status of service:  In process, will continue to follow Medicare Important Message given?   (If response is "NO", the following Medicare IM given date fields will be blank) Date Medicare IM given:   Date Additional Medicare IM given:    Discharge Disposition:    Per UR Regulation:  Reviewed for med. necessity/level of care/duration of stay  If discussed at Long Length of Stay Meetings, dates discussed:    Comments:  Contact:  Allen,Michelle Daughter [phone]  09/22/12 15:49 Letha Cape RN, BSN [phone] patient was very lethargic today, plan for dc tomorrow per MD.  09/21/12 16:38 Letha Cape RN, BSN [phone] patient is to dc back to [facility] on 7/17, CSW following, will be on 5 more days of Invanz  w/out picc which CSW checked with facility and they stated this will be ok as long as the patient has good veins.  09-19-12 8:40am Avie Arenas, RNBSN [phone] Remains on vent.  09-16-12 2:15pm Avie Arenas, RNBSN [phone] From SNF - SW consult placed.

## 2012-09-22 LAB — BLOOD GAS, ARTERIAL
Acid-Base Excess: 6.6 mmol/L — ABNORMAL HIGH (ref 0.0–2.0)
Bicarbonate: 30.7 mEq/L — ABNORMAL HIGH (ref 20.0–24.0)
O2 Saturation: 92.5 %
TCO2: 32.1 mmol/L (ref 0–100)
pO2, Arterial: 63.5 mmHg — ABNORMAL LOW (ref 80.0–100.0)

## 2012-09-22 LAB — GLUCOSE, CAPILLARY
Glucose-Capillary: 80 mg/dL (ref 70–99)
Glucose-Capillary: 93 mg/dL (ref 70–99)
Glucose-Capillary: 94 mg/dL (ref 70–99)
Glucose-Capillary: 109 mg/dL — ABNORMAL HIGH (ref 70–99)

## 2012-09-22 LAB — BASIC METABOLIC PANEL
BUN: 11 mg/dL (ref 6–23)
Chloride: 102 mEq/L (ref 96–112)
Creatinine, Ser: 0.52 mg/dL (ref 0.50–1.10)
GFR calc Af Amer: >90 mL/min (ref >90)
Glucose, Bld: 85 mg/dL (ref 70–99)

## 2012-09-22 LAB — CULTURE, BLOOD (ROUTINE X 2): Culture: NO GROWTH

## 2012-09-22 MED ORDER — SODIUM CHLORIDE 0.9 % IV SOLN
1.0000 g | INTRAVENOUS | Status: DC
  Administered 2012-09-22: 1 g via INTRAVENOUS
  Filled 2012-09-22 (×2): qty 1

## 2012-09-22 NOTE — Progress Notes (Signed)
Event: 09/21/12 @ 1915: I was asked by Dr Jerral Ralph to assess pt at bedside. There is recent question by nursing this evening  regarding weather or not pt is more lethargic than her recent baseline. I called and spoke with Lanora Manis, RN who is caring for pt tonight (and also had pt last night) states pt has been lethargic at baseline but would respond and answer questions. Currently though she admits pt is somewhat more lethargic than last night but will still respond, open eyes and answers questions. ABG ordered. NP to bedside.  Subjective/Objective: Pt is a 57 y/o female w/ h/o CVA, chronic pain and spasticity (on Baclofen pump), and HTN admitted from NH through ED for respiratory failure felt likely d/t Baclofen OD. PCCM initially admitted after intubation in ED. Extubated 09/19/12. Pt remains on Baclofen pump at 1/2 her preadmission dose. At bedside pt noted very awake and alert. She is able to answer simple questions and follow simple commands. Noted (L) sided weakness and contractures, residual from old CVA. Complained several times of being cold and requesting staff put her clothes back on (staff currently bathing pt). She is in NAD. BBS CTA. Skin w/d. PEARRL. Recent VS T-99.7, BP-169/97, P-76, R-20 w/ 02 sats of 95% on  2L via [place]. She denies c/o's other than being cold.  Assessment/Plan: 1. Lethargy: Based on current exam and RN report I do feel pt is at her baseline. I was at bedside for approx 15 minutes and pt remained alert and awake the entire time. ABG cancelled for now. I have requested RN notify me for any concerns regarding changes in pt's LOC or other issues. Will continue to monitor closely.  Leanne Chang, NP-C [facility] Pager [phone]

## 2012-09-22 NOTE — Progress Notes (Signed)
Contacted [employer] rep, who contacted patient's MD who manages the Baclofen pump-we have decreased Baclofen from 200 mcg/day to 150 mcg/day-monitor mental status.

## 2012-09-22 NOTE — Progress Notes (Signed)
PATIENT DETAILS Name: [name] Age: 57 y.o. Sex: female Date of Birth: May 30, 1955 Admit Date: 09/16/2012 Admitting Physician Lupita Leash, MD RUE:[id number] Kellie Shropshire, MD  Subjective: No major issues-sleeping most of the time-arouses easily  Assessment/Plan: Principal Problem:   Acute Hypercarbic respiratory failure -Hypercarbic respiratory failure likely due to morphine/baclofen OD.  -Intubated on 7/11-extubated on 7/14 room air -Continue Baclofen pump running at rate of mcg/day currently  Active Problems:   Narcotic/Baclofen overdose -Indwelling baclofen pump. History of CVA with residual left sided paralysis. Chronic pain and spasticity -Baclofen rate adjusted to /day (1/2 of pt home dose) 09/16/12 -minimize narcotics  Lethargy -sleeping most of the time, arouses very easily in most cases to vocal stimuli or very light painful stimuli, says 1-2 words, stays awake for a while and then dozes back to sleep. Concerned whether or not she is at her baseline, spoke with patient's daughter Christel Mormon 7/17 over the phone, she saw her mother yesterday. Per her-patient this is her usual baseline. -will get ABG today-to make sure CO2 at baseline-but will not plan on any further interventions  ESBL E Coli UTI -On day 5-will change to Invanz on 7/18-will see if SNF can take her without a PICC line and complete a total of 10 days of therapy  Hypernatremia -improving with IVF-Na normal today -encourage PO intake  ARF -resolved with IVF  HTN -moderately controlled -c/w Clonidine transdermally -resume Amlodipine and Labetalol at prior dose  Dyslipidemia -c/w Statins  Hypothyroidism -c/w Levothyroxine  Hx of Seizure Disorder -c/w Keppra and Depakote  OSA -CPAP QHS  History of stroke -hemiplegic on left side -ASA, Statins    Disposition: Remain inpatient-back to SNF in am  DVT Prophylaxis: Prophylactic Heparin   Code Status: Full code    Family Communication None at bedside  Procedures:  None  CONSULTS:  None   MEDICATIONS: Scheduled Meds: . amLODipine  5 mg Oral Daily  . aspirin  81 mg Oral Daily  . cloNIDine  0.1 mg Transdermal Weekly  . divalproex  500 mg Oral BID  . DULoxetine  60 mg Oral Daily  . feeding supplement  30 mL Per Tube 5 X Daily  . heparin subcutaneous  5,000 Units Subcutaneous Q8H  . imipenem-cilastatin  500 mg Intravenous Q6H  . labetalol  200 mg Oral BID  . levETIRAcetam  2,000 mg Oral BID  . levothyroxine  68.5 mcg Per Tube QAC breakfast  . olopatadine  1 drop Both Eyes BID  . pantoprazole  40 mg Oral Q1200  . pneumococcal 23 valent vaccine  0.5 mL Intramuscular Tomorrow-1000  . simvastatin  20 mg Oral q1800   Continuous Infusions: . sodium chloride 75 mL/hr at 09/22/12 0217  . baclofen 40 mg (09/16/12 1330)   PRN Meds:.hydrOXYzine  Antibiotics: Anti-infectives   Start     Dose/Rate Route Frequency Ordered Stop   09/18/12 1415  imipenem-cilastatin (PRIMAXIN) 500 mg in sodium chloride 0.9 % 100 mL IVPB     500 mg 200 mL/hr over 30 Minutes Intravenous 4 times per day 09/18/12 1404 09/27/12 2359   09/18/12 1200  cefTRIAXone (ROCEPHIN) 1 g in dextrose 5 % 50 mL IVPB  Status:  Discontinued     1 g 100 mL/hr over 30 Minutes Intravenous Every 24 hours 09/18/12 1135 09/18/12 1403       PHYSICAL EXAM: Vital signs in last 24 hours: Filed Vitals:   09/21/12 2309 09/22/12 0019 09/22/12 0032 09/22/12 0635  BP: 183/107 131/87  141/95  Pulse: 56 71  67  Temp:    98.9 F (37.2 C)  TempSrc:    Axillary  Resp:   16 20  Height:      Weight:      SpO2:    98%    Weight change:  Filed Weights   09/19/12 0400 09/20/12 0500 09/21/12 0536  Weight: 96.5 kg (212 lb 11.9 oz) 96.6 kg (212 lb 15.4 oz) 96.9 kg (213 lb 10 oz)   Body mass index is 39.06 kg/(m^2).   Gen Exam: sleeping but easily arousable-speaks 1-2 words Neck: Supple, No JVD.   Chest: B/L Clear.   CVS: S1 S2 Regular,  no murmurs.  Abdomen: soft, BS +, non tender, non distended.  Extremities: no edema, lower extremities warm to touch. Neurologic: Left hemiplegic at baseline Skin: No Rash.   Wounds: N/A.    Intake/Output from previous day:  Intake/Output Summary (Last 24 hours) at 09/22/12 1132 Last data filed at 09/22/12 0900  Gross per 24 hour  Intake 1879.33 ml  Output   2125 ml  Net -245.67 ml     LAB RESULTS: CBC  Recent Labs Lab 09/16/12 0300  09/16/12 0900 09/17/12 0420 09/18/12 0340 09/19/12 0400 09/20/12 0715  WBC 8.0  --  8.8 7.7 7.2 6.5 4.0  HGB 12.8  < > 11.9* 11.9* 11.1* 10.7* 11.2*  HCT 40.9  < > 38.4 36.5 34.2* 33.8* 35.2*  PLT 143*  --  137* 134* 132* 124* 123*  MCV 96.2  --  97.5 93.1 91.9 94.9 95.1  MCH 30.1  --  30.2 30.4 29.8 30.1 30.3  MCHC 31.3  --  31.0 32.6 32.5 31.7 31.8  RDW 15.0  --  14.8 14.5 14.5 14.6 14.4  LYMPHSABS 2.6  --   --   --   --   --  2.3  MONOABS 0.9  --   --   --   --   --  0.4  EOSABS 0.1  --   --   --   --   --  0.1  BASOSABS 0.0  --   --   --   --   --  0.0  < > = values in this interval not displayed.  Chemistries   Recent Labs Lab 09/17/12 0420 09/18/12 0340 09/19/12 0400 09/20/12 0715 09/21/12 0704 09/22/12 0514  NA 144 143 148* 150* 145 138  K 3.7 3.2* 3.4* 3.6 3.8 3.6  CL 113* 112 115* 112 109 102  CO2 23 23 25 29  33* 32  GLUCOSE 80 95 135* 78 105* 85  BUN 15 19 21 17 16 11   CREATININE 0.70 0.57 0.47* 0.47* 0.54 0.52  CALCIUM 8.2* 8.5 7.9* 8.5 8.6 8.5  MG 1.5 2.0 2.0  --   --   --     CBG:  Recent Labs Lab 09/21/12 1716 09/21/12 2026 09/21/12 2339 09/22/12 0338 09/22/12 0749  GLUCAP 105* 79 101* 80 78    GFR Estimated Creatinine Clearance: 84.3 ml/min (by C-G formula based on Cr of 0.52).  Coagulation profile No results found for this basename: INR, PROTIME,  in the last 168 hours  Cardiac Enzymes  Recent Labs Lab 09/17/12 1325 09/17/12 2230 09/18/12 0340  TROPONINI <0.30 <0.30 <0.30    No  components found with this basename: POCBNP,  No results found for this basename: DDIMER,  in the last 72 hours No results found for this basename: HGBA1C,  in the last 72 hours No  results found for this basename: CHOL, HDL, LDLCALC, TRIG, CHOLHDL, LDLDIRECT,  in the last 72 hours No results found for this basename: TSH, T4TOTAL, FREET3, T3FREE, THYROIDAB,  in the last 72 hours No results found for this basename: VITAMINB12, FOLATE, FERRITIN, TIBC, IRON, RETICCTPCT,  in the last 72 hours No results found for this basename: LIPASE, AMYLASE,  in the last 72 hours  Urine Studies No results found for this basename: UACOL, UAPR, USPG, UPH, UTP, UGL, UKET, UBIL, UHGB, UNIT, UROB, ULEU, UEPI, UWBC, URBC, UBAC, CAST, CRYS, UCOM, BILUA,  in the last 72 hours  MICROBIOLOGY: Recent Results (from the past 240 hour(s))  URINE CULTURE     Status: None   Collection Time    09/16/12  3:57 AM      Result Value Range Status   Specimen Description URINE, CATHETERIZED   Final   Special Requests NONE   Final   Culture  Setup Time 09/16/2012 08:34   Final   Colony Count >=100,000 COLONIES/ML   Final   Culture ESCHERICHIA COLI   Final   Report Status 09/18/2012 FINAL   Final   Organism ID, Bacteria ESCHERICHIA COLI   Final  MRSA PCR SCREENING     Status: None   Collection Time    09/16/12  8:17 AM      Result Value Range Status   MRSA by PCR NEGATIVE  NEGATIVE Final   Comment:            The GeneXpert MRSA Assay (FDA     approved for NASAL specimens     only), is one component of a     comprehensive MRSA colonization     surveillance program. It is not     intended to diagnose MRSA     infection nor to guide or     monitor treatment for     MRSA infections.  CULTURE, BLOOD (ROUTINE X 2)     Status: None   Collection Time    09/16/12  9:40 AM      Result Value Range Status   Specimen Description BLOOD RIGHT ARM   Final   Special Requests BOTTLES DRAWN AEROBIC ONLY [facility]   Final   Culture  Setup  Time 09/16/2012 14:00   Final   Culture NO GROWTH 5 DAYS   Final   Report Status 09/22/2012 FINAL   Final  CULTURE, BLOOD (ROUTINE X 2)     Status: None   Collection Time    09/16/12 12:25 PM      Result Value Range Status   Specimen Description BLOOD RIGHT HAND   Final   Special Requests BOTTLES DRAWN AEROBIC ONLY [facility]   Final   Culture  Setup Time 09/16/2012 16:21   Final   Culture NO GROWTH 5 DAYS   Final   Report Status 09/22/2012 FINAL   Final  URINE CULTURE     Status: None   Collection Time    09/16/12  7:51 PM      Result Value Range Status   Specimen Description URINE, CATHETERIZED   Final   Special Requests Normal   Final   Culture  Setup Time 09/17/2012 15:05   Final   Colony Count >=100,000 COLONIES/ML   Final   Culture     Final   Value: ESCHERICHIA COLI     Note: Confirmed Extended Spectrum Beta-Lactamase Producer (ESBL) CRITICAL RESULT CALLED TO, READ BACK BY AND VERIFIED WITH: MAVIS N@2 :15PM ON 09/19/12 BY DANTS  Report Status 09/19/2012 FINAL   Final   Organism ID, Bacteria ESCHERICHIA COLI   Final  URINE CULTURE     Status: None   Collection Time    09/20/12  2:22 PM      Result Value Range Status   Specimen Description URINE, CLEAN CATCH   Final   Special Requests NONE   Final   Culture  Setup Time 09/20/2012 22:37   Final   Colony Count NO GROWTH   Final   Culture NO GROWTH   Final   Report Status 09/21/2012 FINAL   Final    RADIOLOGY STUDIES/RESULTS: Ct Head Wo Contrast  09/16/2012   *RADIOLOGY REPORT*  Clinical Data: Drug overdose.  Altered mental status.  CT HEAD WITHOUT CONTRAST  Technique:  Contiguous axial images were obtained from the base of the skull through the vertex without contrast.  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  Low attenuation change along the right frontal parietal and temporal deep white matter consistent with old infarct.  Old lacunar infarcts in the left basal ganglia.  Old infarct and  calcification in the deep white matter in the left anterior frontal region.  Ventricular dilatation likely related to contraction of the encephalomalacia. No acute mass effect or midline shift.  No abnormal extra-axial fluid collections.  Gray-white matter junctions are distinct. Basal cisterns are not effaced.  No evidence of acute intracranial hemorrhage.  No depressed skull fractures.  Visualized paranasal sinuses and mastoid air cells are not opacified.  No significant changes since the previous study.  IMPRESSION: No evidence of acute intracranial abnormality.  Chronic changes including aneurysm clip and old bilateral deep white matter infarcts.   Original Report Authenticated By: Burman Nieves, M.D.   Dg Chest Port 1 View  09/20/2012   *RADIOLOGY REPORT*  Clinical Data: Evaluate endotracheal tube placement.  PORTABLE CHEST - 1 VIEW  Comparison: Chest x-ray 09/18/2012.  Findings: The patient has been extubated.  Linear opacities throughout the left mid and lower lung favored to predominately reflect subsegmental atelectasis, although underlying airspace consolidation in the medial aspect of the left lower lobe is not excluded.  Possible trace left pleural effusion.  No evidence of pulmonary edema.  Heart size is within normal limits. The patient is rotated to the right on today's exam, resulting in distortion of the mediastinal contours and reduced diagnostic sensitivity and specificity for mediastinal pathology.  IMPRESSION: 1.  Interval extubation of the patient. 2.  Persistent opacities at the left base may reflect areas of atelectasis and/or consolidation, with superimposed small left pleural effusion.   Original Report Authenticated By: Trudie Reed, M.D.   Dg Chest Port 1 View  09/18/2012   *RADIOLOGY REPORT*  Clinical Data: Acute respiratory failure on ventilator.  Morbid obesity.  PORTABLE CHEST - 1 VIEW  Comparison: 09/17/2012  Findings: Endotracheal tube and nasogastric tube remain in  appropriate position.  Low lung volumes again demonstrated. Bilateral perihilar and basilar atelectasis shows no significant change.  No evidence of pulmonary consolidation or edema.  Heart size is normal.  IMPRESSION: No significant change in the bilateral subsegmental atelectasis.   Original Report Authenticated By: Myles Rosenthal, M.D.   Dg Chest Port 1 View  09/17/2012   *RADIOLOGY REPORT*  Clinical Data: Acute respiratory failure on ventilator.  PORTABLE CHEST - 1 VIEW  Comparison: 09/16/2012  Findings: Endotracheal tube and nasogastric tube remain in appropriate position.  Low lung volumes are again seen. Subsegmental atelectasis again seen in the left  lung base. Previously noted opacity in the central right upper lobe is no longer visualized.  No evidence of pulmonary consolidation or edema.  Heart size is within normal limits allowing for low lung volumes.  IMPRESSION: Resolution of right upper lobe opacity.  Persistent left lower lobe subsegmental atelectasis.   Original Report Authenticated By: Myles Rosenthal, M.D.   Dg Chest Portable 1 View  09/16/2012   *RADIOLOGY REPORT*  Clinical Data: Drug overdose.  PORTABLE CHEST - 1 VIEW  Comparison: 08/06/2012  Findings: Endotracheal tube has been placed.  The tip measures about 1.9 cm above the carina.  Enteric tube tip is in the left upper quadrant consistent with location in the distal stomach. Shallow inspiration.  Atelectasis or infiltration in the left lung base and right upper lung.  Suggestion of perihilar infiltration on the left.  No pneumothorax.  No blunting of costophrenic angles.  IMPRESSION: Appliances appear to be satisfactory location.  Shallow inspiration.  Infiltration or atelectasis in the right upper lung and left lower lung with possible left perihilar infiltrates.   Original Report Authenticated By: Burman Nieves, M.D.    Jeoffrey Massed, MD  [facility] Pager:[phone]  If 7PM-7AM, please contact  night-coverage [email or web address] Password [facility] 09/22/2012, 11:32 AM   LOS: 6 days

## 2012-09-22 NOTE — Evaluation (Signed)
Physical Therapy Evaluation Patient Details Name: [name] MRN: [id number] DOB: July 05, 1955 Today's Date: 09/22/2012 Time: 7829-5621 PT Time Calculation (min): 26 min  PT Assessment / Plan / Recommendation History of Present Illness  Patient is a 57 y/o female resident of [facility] admitted with hypercarbic respiratory failure likely due to medication overdose.  Clinical Impression  Patient presents with significant dependencies in mobility due to left hemiparesis, prolonged bedrest with decreased activity tolerance decreased balance and may benefit from skilled PT in acute setting to decrease burden of care at next venue.    PT Assessment  Patient needs continued PT services    Follow Up Recommendations  SNF    Does the patient have the potential to tolerate intense rehabilitation    N/A  Barriers to Discharge  None      Equipment Recommendations  None recommended by PT    Recommendations for Other Services   None  Frequency Min 2X/week    Precautions / Restrictions Precautions Precautions: Fall Precaution Comments: orange isolation, flexiseal   Pertinent Vitals/Pain Min c/o pain with ROM left UE/LE      Mobility  Bed Mobility Bed Mobility: Rolling Left;Left Sidelying to Sit;Sit to Supine;Scooting to [facility] Rolling Left: With rail;1: +1 Total assist Left Sidelying to Sit: 1: +1 Total assist;HOB elevated;With rails Sit to Supine: 1: +1 Total assist Scooting to HOB: 1: +2 Total assist Scooting to [facility]: Patient Percentage: 0% Details for Bed Mobility Assistance: assisted to scoot pt to right, then roll to left with pad under her, moved feet off edge of bed and lifted trunk, pt assist less than 10% Transfers Transfers: Not assessed Details for Transfer Assistance: states they use lift to get OOB at facility    Exercises General Exercises - Lower Extremity Ankle Circles/Pumps: PROM;Both;5 reps;Supine Heel Slides: Both;5 reps;Supine Hip ABduction/ADduction:  PROM;Both;5 reps;Supine   PT Diagnosis: Hemiplegia non-dominant side  PT Problem List: Decreased range of motion;Decreased activity tolerance;Decreased balance;Decreased mobility;Pain;Impaired tone PT Treatment Interventions: Balance training;Functional mobility training;Patient/family education;Therapeutic activities;Therapeutic exercise     PT Goals(Current goals can be found in the care plan section) Acute Rehab PT Goals Patient Stated Goal: to return to [facility] PT Goal Formulation: With patient/family Time For Goal Achievement: 10/06/12 Potential to Achieve Goals: Fair  Visit Information  Last PT Received On: 09/22/12 Assistance Needed: +2 History of Present Illness: Patient is a 57 y/o female resident of [facility] admitted with hypercarbic respiratory failure likely due to medication overdose.       Prior Functioning  Home Living Family/patient expects to be discharged to:: Skilled nursing facility Additional Comments: [facility] Prior Function Level of Independence: Needs assistance Gait / Transfers Assistance Needed: total care Communication Communication: No difficulties    Cognition  Cognition Arousal/Alertness: Awake/alert Behavior During Therapy: Flat affect Overall Cognitive Status: No family/caregiver present to determine baseline cognitive functioning    Extremity/Trunk Assessment Upper Extremity Assessment Upper Extremity Assessment: RUE deficits/detail;LUE deficits/detail RUE Deficits / Details: AROM/AAROM WFL; strength grossly 3/5; tremor noted with moving other extremities LUE Deficits / Details: increased tone throughout and hand curled into ball; able to move fingers out, but c/o pain; stiff in slight elbow flexion and shoulder at side difficult to move due to c/o pain Lower Extremity Assessment Lower Extremity Assessment: RLE deficits/detail;LLE deficits/detail RLE Deficits / Details: PROM Stiff in extension, but able to flex up to about 45  degrees; ankle/foot fixed in equinovarus position LLE Deficits / Details: held in hip external rotation flexion and  abduction with limited knee extension (about 30 degrees from neutral) with PROM, eqinouvarus ankle/foot positioning with edema in foot; painful with PROM Cervical / Trunk Assessment Cervical / Trunk Assessment: Other exceptions Cervical / Trunk Exceptions: Cervical rotation equal and decreased bilaterally; trunk held in left lateral flexion with sitting briefly edge of bed   Balance Balance Balance Assessed: Yes Static Sitting Balance Static Sitting - Balance Support: Feet unsupported;Right upper extremity supported Static Sitting - Level of Assistance: 3: Mod assist Static Sitting - Comment/# of Minutes: sat edge of bed approx 10 sec, c/o dizziness and wanted to lie back down  End of Session PT - End of Session Equipment Utilized During Treatment: Oxygen Activity Tolerance: Patient limited by fatigue;Patient limited by pain;Other (comment) (dizziness sitting EOB) Patient left: in bed;with call bell/phone within reach  GP     [facility] 09/22/2012, 5:05 PM [place], [place] [phone] 09/22/2012

## 2012-09-22 NOTE — Progress Notes (Signed)
ANTIBIOTIC CONSULT NOTE - INITIAL  Pharmacy Consult for Invanz Indication: ESBL EColi UTI  Allergies  Allergen Reactions  . Codeine Other (See Comments)    unknown    Patient Measurements: Height: 5\' 2"  (157.5 cm) Weight: 213 lb 10 oz (96.9 kg) IBW/kg (Calculated) : 50.1 Adjusted Body Weight:   Vital Signs: Temp: 98.9 F (37.2 C) (07/17 0635) Temp src: Axillary (07/17 0635) BP: 141/95 mmHg (07/17 0635) Pulse Rate: 67 (07/17 0635) Intake/Output from previous day: 07/16 0701 - 07/17 0700 In: 1779.3 [P.O.:180; I.V.:1599.3] Out: 2125 [Urine:2125] Intake/Output from this shift: Total I/O In: 100 [P.O.:100] Out: -   Labs:  Recent Labs  09/20/12 0715 09/21/12 0704 09/22/12 0514  WBC 4.0  --   --   HGB 11.2*  --   --   PLT 123*  --   --   CREATININE 0.47* 0.54 0.52   Estimated Creatinine Clearance: 84.3 ml/min (by C-G formula based on Cr of 0.52). No results found for this basename: VANCOTROUGH, Leodis Binet, VANCORANDOM, GENTTROUGH, GENTPEAK, GENTRANDOM, TOBRATROUGH, TOBRAPEAK, TOBRARND, AMIKACINPEAK, AMIKACINTROU, AMIKACIN,  in the last 72 hours   Microbiology: Recent Results (from the past 720 hour(s))  URINE CULTURE     Status: None   Collection Time    09/16/12  3:57 AM      Result Value Range Status   Specimen Description URINE, CATHETERIZED   Final   Special Requests NONE   Final   Culture  Setup Time 09/16/2012 08:34   Final   Colony Count >=100,000 COLONIES/ML   Final   Culture ESCHERICHIA COLI   Final   Report Status 09/18/2012 FINAL   Final   Organism ID, Bacteria ESCHERICHIA COLI   Final  MRSA PCR SCREENING     Status: None   Collection Time    09/16/12  8:17 AM      Result Value Range Status   MRSA by PCR NEGATIVE  NEGATIVE Final   Comment:            The GeneXpert MRSA Assay (FDA     approved for NASAL specimens     only), is one component of a     comprehensive MRSA colonization     surveillance program. It is not     intended to diagnose  MRSA     infection nor to guide or     monitor treatment for     MRSA infections.  CULTURE, BLOOD (ROUTINE X 2)     Status: None   Collection Time    09/16/12  9:40 AM      Result Value Range Status   Specimen Description BLOOD RIGHT ARM   Final   Special Requests BOTTLES DRAWN AEROBIC ONLY [facility]   Final   Culture  Setup Time 09/16/2012 14:00   Final   Culture NO GROWTH 5 DAYS   Final   Report Status 09/22/2012 FINAL   Final  CULTURE, BLOOD (ROUTINE X 2)     Status: None   Collection Time    09/16/12 12:25 PM      Result Value Range Status   Specimen Description BLOOD RIGHT HAND   Final   Special Requests BOTTLES DRAWN AEROBIC ONLY [facility]   Final   Culture  Setup Time 09/16/2012 16:21   Final   Culture NO GROWTH 5 DAYS   Final   Report Status 09/22/2012 FINAL   Final  URINE CULTURE     Status: None   Collection Time    09/16/12  7:51 PM      Result Value Range Status   Specimen Description URINE, CATHETERIZED   Final   Special Requests Normal   Final   Culture  Setup Time 09/17/2012 15:05   Final   Colony Count >=100,000 COLONIES/ML   Final   Culture     Final   Value: ESCHERICHIA COLI     Note: Confirmed Extended Spectrum Beta-Lactamase Producer (ESBL) CRITICAL RESULT CALLED TO, READ BACK BY AND VERIFIED WITH: MAVIS N@2 :15PM ON 09/19/12 BY DANTS   Report Status 09/19/2012 FINAL   Final   Organism ID, Bacteria ESCHERICHIA COLI   Final  URINE CULTURE     Status: None   Collection Time    09/20/12  2:22 PM      Result Value Range Status   Specimen Description URINE, CLEAN CATCH   Final   Special Requests NONE   Final   Culture  Setup Time 09/20/2012 22:37   Final   Colony Count NO GROWTH   Final   Culture NO GROWTH   Final   Report Status 09/21/2012 FINAL   Final    Medical History: Past Medical History  Diagnosis Date  . Hypertension   . Seizure disorder   . Metabolic encephalopathy 05/06/2010  . Respiratory failure 05/06/10  . UTI (lower urinary tract infection) 05/06/10   . 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 Sleep apnea 04/2010    on CPAP, "severe central sleep apnea"  . 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 Chronic pain   . Depression   . 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 Spasticity     chronic  . Diabetes mellitus without complication   . Thrombocytopenia, unspecified 07/28/2012  . Acute respiratory failure 07/27/2012  . Narcotic overdose 07/27/2012  . 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 Unspecified hypothyroidism   . Sleep apnea   . Seizures   . Arthritis     OSTEO LEFT LEG    Medications:  Scheduled:  . amLODipine  5 mg Oral Daily  . aspirin  81 mg Oral Daily  . cloNIDine  0.1 mg Transdermal Weekly  . divalproex  500 mg Oral BID  . DULoxetine  60 mg Oral Daily  . ertapenem  1 g Intravenous Q24H  . feeding supplement  30 mL Per Tube 5 X Daily  . heparin subcutaneous  5,000 Units Subcutaneous Q8H  . labetalol  200 mg Oral BID  . levETIRAcetam  2,000 mg Oral BID  . levothyroxine  68.5 mcg Per Tube QAC breakfast  . olopatadine  1 drop Both Eyes BID  . pantoprazole  40 mg Oral Q1200  . pneumococcal 23 valent vaccine  0.5 mL Intramuscular Tomorrow-1000  . simvastatin  20 mg Oral q1800   Assessment: 57yo female to change from Primaxin to Ertapenem for ESBL EColi UTI, to complete 10day course of antibiotics.  Cr has been stable, no dosage adjustment needed.  Goal of Therapy:  resolution of infection  Plan:  Ivanz 1gm IV q24 x 5 doses   Marisue Humble, PharmD Clinical Pharmacist  System- [facility]

## 2012-09-23 LAB — GLUCOSE, CAPILLARY: Glucose-Capillary: 115 mg/dL — ABNORMAL HIGH (ref 70–99)

## 2012-09-23 MED ORDER — SODIUM CHLORIDE 0.9 % IV SOLN: 1.0000 g | INTRAVENOUS | Status: DC

## 2012-09-23 MED ORDER — ACETAMINOPHEN 325 MG PO TABS
650.0000 mg | ORAL_TABLET | ORAL | Status: DC | PRN
  Administered 2012-09-23: 650 mg via ORAL
  Filled 2012-09-23: qty 2

## 2012-09-23 MED ORDER — PNEUMOCOCCAL VAC POLYVALENT 25 MCG/0.5ML IJ INJ
0.5000 mL | INJECTION | Freq: Once | INTRAMUSCULAR | Status: AC
  Administered 2012-09-23: 0.5 mL via INTRAMUSCULAR
  Filled 2012-09-23: qty 0.5

## 2012-09-23 MED ORDER — BACLOFEN 40 MG/20ML IT SOLN: INTRATHECAL | Status: DC

## 2012-09-23 NOTE — Discharge Summary (Addendum)
**Note De-Identified Rita Obfuscation** PATIENT DETAILS Name: [name] Age: 57 y.o. Sex: female Date of Birth: 01/29/1956 MRN: [id number]. Admit Date: 09/16/2012 Admitting Physician: Rita Leash, MD [id number] Rita Shropshire, MD  Recommendations for Outpatient Follow-up:  1.Continue with IV Invanz for 4 more days from 7/18. This can be given Intramuscular as well if needed. 2.Please call [employer] for further adjustments of Baclofen pump if needed.On call device representative phone # is [phone].Local Rep- [name] Rep at [phone] for any changes  PRIMARY DISCHARGE DIAGNOSIS:  Principal Problem:   Acute respiratory failure Active Problems:   Narcotic overdose   History of stroke   Central sleep apnea   Seizure disorder      PAST MEDICAL HISTORY: Past Medical History  Diagnosis Date  . Hypertension   . Seizure disorder   . Metabolic encephalopathy 05/06/2010  . Respiratory failure 05/06/10  . UTI (lower urinary tract infection) 05/06/10  . 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 Sleep apnea 04/2010    on CPAP, "severe central sleep apnea"  . 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 Chronic pain   . Depression   . 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 Spasticity     chronic  . Diabetes mellitus without complication   . Thrombocytopenia, unspecified 07/28/2012  . Acute respiratory failure 07/27/2012  . Narcotic overdose 07/27/2012  . 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 Unspecified hypothyroidism   . Sleep apnea   . Seizures   . Arthritis     OSTEO LEFT LEG    DISCHARGE MEDICATIONS:   Medication List    STOP taking these medications       oxyCODONE 5 MG immediate release tablet  Commonly known as:  Oxy IR/ROXICODONE     PRESCRIPTION MEDICATION     zolpidem 5 MG tablet  Commonly known as:  AMBIEN      TAKE these medications       amLODipine 5 MG tablet  Commonly known as:  NORVASC  Take 5 mg by  mouth daily.     aspirin EC 81 MG tablet  Take 81 mg by mouth daily.     baclofen 40 MG/20ML Soln  Commonly known as:  LIORESAL  - 40 mg, Intrathecal, at 0.0042 mL/hr, Continuous  - Pump set by [employer] Tech to run at 150 mcg/day (pump already established prior to admission, continuing use of home pump).  Rate changes performed by [employer] tech For use in [employer] Pump. Please call [name] Rep at [phone] for any changes     bisacodyl 10 MG suppository  Commonly known as:  DULCOLAX  Place 10 mg rectally daily as needed for constipation.     cholecalciferol 1000 UNITS tablet  Commonly known as:  VITAMIN D  Take 3,000 Units by mouth every morning.     cloNIDine 0.1 mg/24hr patch  Commonly known as:  CATAPRES - Dosed in mg/24 hr  Place 1 patch (0.1 mg total) onto the skin once a week.     divalproex 500 MG DR tablet  Commonly known as:  DEPAKOTE  Take 500 mg by mouth 2 (two) times daily.     DULoxetine 60 MG capsule  Commonly known as:  CYMBALTA  Take 60 mg by mouth daily.     DUONEB 0.5-2.5 (3) MG/3ML Soln  Generic drug:  ipratropium-albuterol  Take 3 mLs by nebulization every 6 (six) hours as needed (for  shortness of breath/wheezing).     hydrOXYzine 25 MG tablet  Commonly known as:  ATARAX/VISTARIL  Take 12.5 mg by mouth daily as needed for itching or anxiety.     labetalol 200 MG tablet  Commonly known as:  NORMODYNE  Take 200 mg by mouth 2 (two) times daily.     levETIRAcetam 1000 MG tablet  Commonly known as:  KEPPRA  Take 2,000 mg by mouth 2 (two) times daily.     levothyroxine 137 MCG tablet  Commonly known as:  SYNTHROID, LEVOTHROID  Take 0.5 tablets (68.5 mcg total) by mouth daily before breakfast.     losartan 50 MG tablet  Commonly known as:  COZAAR  Take 1 tablet (50 mg total) by mouth at bedtime.     olopatadine 0.1 % ophthalmic solution  Commonly known as:  PATANOL  Place 1 drop into both eyes 2 (two) times daily.      omeprazole 20 MG capsule  Commonly known as:  PRILOSEC  Take 20 mg by mouth daily.     polyethylene glycol packet  Commonly known as:  MIRALAX / GLYCOLAX  Take 17 g by mouth daily.     simvastatin 20 MG tablet  Commonly known as:  ZOCOR  Take 20 mg by mouth at bedtime.     sodium chloride 0.9 % SOLN 50 mL with ertapenem 1 G SOLR 1 g  Inject 1 g into the vein daily. For 4 more days from 09/23/12        ALLERGIES:   Allergies  Allergen Reactions  . Codeine Other (See Comments)    unknown    BRIEF HPI:  See H&P, Labs, Consult and Test reports for all details in brief, [name] is a 57 y.o. female NH resident who is on a chronic infusion of baclofen and morphine Rita an implanted intraabdominal pump. She was seen by her neurologist one day prior to admission and dosing of baclofen was increased. On the morning of admission she was found,at her NH unresponsive with a respiratory rate of around 6 so EMS was called. En route she responded briefly to 2mg  IV narcan. In the ER she was given additional dose of Narcan and became hypotensive with systolics down to the 50s. She was eventually intubated due to respiratory depression.  CONSULTATIONS:   pulmonary/intensive care  PERTINENT RADIOLOGIC STUDIES: Ct Head Wo Contrast  09/16/2012   *RADIOLOGY REPORT*  Clinical Data: Drug overdose.  Altered mental status.  CT HEAD WITHOUT CONTRAST  Technique:  Contiguous axial images were obtained from the base of the skull through the vertex without contrast.  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  Low attenuation change along the right frontal parietal and temporal deep white matter consistent with old infarct.  Old lacunar infarcts in the left basal ganglia.  Old infarct and calcification in the deep white matter in the left anterior frontal region.  Ventricular dilatation likely related to contraction of the encephalomalacia. No acute mass effect or midline  shift.  No abnormal extra-axial fluid collections.  Gray-white matter junctions are distinct. Basal cisterns are not effaced.  No evidence of acute intracranial hemorrhage.  No depressed skull fractures.  Visualized paranasal sinuses and mastoid air cells are not opacified.  No significant changes since the previous study.  IMPRESSION: No evidence of acute intracranial abnormality.  Chronic changes including aneurysm clip and old bilateral deep white matter infarcts.   Original Report Authenticated By: Burman Nieves, M.D.  Dg Chest Port 1 View  09/20/2012   *RADIOLOGY REPORT*  Clinical Data: Evaluate endotracheal tube placement.  PORTABLE CHEST - 1 VIEW  Comparison: Chest x-ray 09/18/2012.  Findings: The patient has been extubated.  Linear opacities throughout the left mid and lower lung favored to predominately reflect subsegmental atelectasis, although underlying airspace consolidation in the medial aspect of the left lower lobe is not excluded.  Possible trace left pleural effusion.  No evidence of pulmonary edema.  Heart size is within normal limits. The patient is rotated to the right on today's exam, resulting in distortion of the mediastinal contours and reduced diagnostic sensitivity and specificity for mediastinal pathology.  IMPRESSION: 1.  Interval extubation of the patient. 2.  Persistent opacities at the left base may reflect areas of atelectasis and/or consolidation, with superimposed small left pleural effusion.   Original Report Authenticated By: Trudie Reed, M.D.   Dg Chest Port 1 View  09/18/2012   *RADIOLOGY REPORT*  Clinical Data: Acute respiratory failure on ventilator.  Morbid obesity.  PORTABLE CHEST - 1 VIEW  Comparison: 09/17/2012  Findings: Endotracheal tube and nasogastric tube remain in appropriate position.  Low lung volumes again demonstrated. Bilateral perihilar and basilar atelectasis shows no significant change.  No evidence of pulmonary consolidation or edema.  Heart  size is normal.  IMPRESSION: No significant change in the bilateral subsegmental atelectasis.   Original Report Authenticated By: Myles Rosenthal, M.D.   Dg Chest Port 1 View  09/17/2012   *RADIOLOGY REPORT*  Clinical Data: Acute respiratory failure on ventilator.  PORTABLE CHEST - 1 VIEW  Comparison: 09/16/2012  Findings: Endotracheal tube and nasogastric tube remain in appropriate position.  Low lung volumes are again seen. Subsegmental atelectasis again seen in the left lung base. Previously noted opacity in the central right upper lobe is no longer visualized.  No evidence of pulmonary consolidation or edema.  Heart size is within normal limits allowing for low lung volumes.  IMPRESSION: Resolution of right upper lobe opacity.  Persistent left lower lobe subsegmental atelectasis.   Original Report Authenticated By: Myles Rosenthal, M.D.   Dg Chest Portable 1 View  09/16/2012   *RADIOLOGY REPORT*  Clinical Data: Drug overdose.  PORTABLE CHEST - 1 VIEW  Comparison: 08/06/2012  Findings: Endotracheal tube has been placed.  The tip measures about 1.9 cm above the carina.  Enteric tube tip is in the left upper quadrant consistent with location in the distal stomach. Shallow inspiration.  Atelectasis or infiltration in the left lung base and right upper lung.  Suggestion of perihilar infiltration on the left.  No pneumothorax.  No blunting of costophrenic angles.  IMPRESSION: Appliances appear to be satisfactory location.  Shallow inspiration.  Infiltration or atelectasis in the right upper lung and left lower lung with possible left perihilar infiltrates.   Original Report Authenticated By: Burman Nieves, M.D.     PERTINENT LAB RESULTS: CBC: No results found for this basename: WBC, HGB, HCT, PLT,  in the last 72 hours CMET CMP     Component Value Date/Time   NA 138 09/22/2012 0514   K 3.6 09/22/2012 0514   CL 102 09/22/2012 0514   CO2 32 09/22/2012 0514   GLUCOSE 85 09/22/2012 0514   BUN 11 09/22/2012 0514    CREATININE 0.52 09/22/2012 0514   CALCIUM 8.5 09/22/2012 0514   PROT 5.4* 09/19/2012 0400   ALBUMIN 1.9* 09/19/2012 0400   AST 10 09/19/2012 0400   ALT 5 09/19/2012 0400   ALKPHOS 39 09/19/2012  0400   BILITOT 0.2* 09/19/2012 0400   GFRNONAA >90 09/22/2012 0514   GFRAA >90 09/22/2012 0514    GFR Estimated Creatinine Clearance: 87.2 ml/min (by C-G formula based on Cr of 0.52). No results found for this basename: LIPASE, AMYLASE,  in the last 72 hours No results found for this basename: CKTOTAL, CKMB, CKMBINDEX, TROPONINI,  in the last 72 hours No components found with this basename: POCBNP,  No results found for this basename: DDIMER,  in the last 72 hours No results found for this basename: HGBA1C,  in the last 72 hours No results found for this basename: CHOL, HDL, LDLCALC, TRIG, CHOLHDL, LDLDIRECT,  in the last 72 hours No results found for this basename: TSH, T4TOTAL, FREET3, T3FREE, THYROIDAB,  in the last 72 hours No results found for this basename: VITAMINB12, FOLATE, FERRITIN, TIBC, IRON, RETICCTPCT,  in the last 72 hours Coags: No results found for this basename: PT, INR,  in the last 72 hours Microbiology: Recent Results (from the past 240 hour(s))  URINE CULTURE     Status: None   Collection Time    09/16/12  3:57 AM      Result Value Range Status   Specimen Description URINE, CATHETERIZED   Final   Special Requests NONE   Final   Culture  Setup Time 09/16/2012 08:34   Final   Colony Count >=100,000 COLONIES/ML   Final   Culture ESCHERICHIA COLI   Final   Report Status 09/18/2012 FINAL   Final   Organism ID, Bacteria ESCHERICHIA COLI   Final  MRSA PCR SCREENING     Status: None   Collection Time    09/16/12  8:17 AM      Result Value Range Status   MRSA by PCR NEGATIVE  NEGATIVE Final   Comment:            The GeneXpert MRSA Assay (FDA     approved for NASAL specimens     only), is one component of a     comprehensive MRSA colonization     surveillance program. It is not      intended to diagnose MRSA     infection nor to guide or     monitor treatment for     MRSA infections.  CULTURE, BLOOD (ROUTINE X 2)     Status: None   Collection Time    09/16/12  9:40 AM      Result Value Range Status   Specimen Description BLOOD RIGHT ARM   Final   Special Requests BOTTLES DRAWN AEROBIC ONLY [facility]   Final   Culture  Setup Time 09/16/2012 14:00   Final   Culture NO GROWTH 5 DAYS   Final   Report Status 09/22/2012 FINAL   Final  CULTURE, BLOOD (ROUTINE X 2)     Status: None   Collection Time    09/16/12 12:25 PM      Result Value Range Status   Specimen Description BLOOD RIGHT HAND   Final   Special Requests BOTTLES DRAWN AEROBIC ONLY [facility]   Final   Culture  Setup Time 09/16/2012 16:21   Final   Culture NO GROWTH 5 DAYS   Final   Report Status 09/22/2012 FINAL   Final  URINE CULTURE     Status: None   Collection Time    09/16/12  7:51 PM      Result Value Range Status   Specimen Description URINE, CATHETERIZED   Final   Special  Requests Normal   Final   Culture  Setup Time 09/17/2012 15:05   Final   Colony Count >=100,000 COLONIES/ML   Final   Culture     Final   Value: ESCHERICHIA COLI     Note: Confirmed Extended Spectrum Beta-Lactamase Producer (ESBL) CRITICAL RESULT CALLED TO, READ BACK BY AND VERIFIED WITH: MAVIS N@2 :15PM ON 09/19/12 BY DANTS   Report Status 09/19/2012 FINAL   Final   Organism ID, Bacteria ESCHERICHIA COLI   Final  URINE CULTURE     Status: None   Collection Time    09/20/12  2:22 PM      Result Value Range Status   Specimen Description URINE, CLEAN CATCH   Final   Special Requests NONE   Final   Culture  Setup Time 09/20/2012 22:37   Final   Colony Count NO GROWTH   Final   Culture NO GROWTH   Final   Report Status 09/21/2012 FINAL   Final     BRIEF HOSPITAL COURSE:  Acute Hypercarbic respiratory failure  -Hypercarbic respiratory failure likely due to morphine/baclofen OD.  -Intubated on 7/11-extubated on 7/14 room air   -Continued Baclofen pump running at rate of mcg/day, since she was sleeping most of the day, [employer] Rep was contacted, who then spoke with her primary neurologist in [place] who suggested we decrease Baclofen to 150 mcg/day on 7/17.   Active Problems:  Narcotic/Baclofen overdose  -Indwelling baclofen pump. History of CVA with residual left sided paralysis. Chronic pain and spasticity  -Baclofen rate adjusted to /day (1/2 of pt home dose) 09/16/12  -minimize narcotics   Lethargy  -sleeping most of the time, arouses very easily in most cases to vocal stimuli or very light painful stimuli, says 1-2 words, stays awake for a while and then dozes back to sleep. Concerned whether or not she is at her baseline, spoke with patient's daughter Christel Mormon 7/17 over the phone, she saw her mother yesterday. Per her-patient this is her usual baseline. ABG done on 7/17 did not reveal any significant hypercarbia. After decreasing her Baclofen dose to 150 mcg/day, she is much more awake and alert this morning and speaking very clearly   ESBL E Coli UTI  -On day 6-c/w Invanz for 4 more days from 7/18. This can be given Intramuscularly if needed as well.Spoke with the Pharmacist during rounds, Intramuscular dose will be the same as the Intravenous dose.  Hypernatremia  -improved  with IVF-Na back to normal -encourage PO intake   ARF  -resolved with IVF   HTN  -moderately controlled  -c/w Clonidine transdermally  -resume Amlodipine and Labetalol at prior dose  -resume Losartan on discharge  Dyslipidemia  -c/w Statins   Hypothyroidism  -c/w Levothyroxine   Hx of Seizure Disorder  -c/w Keppra and Depakote   OSA  -CPAP QHS  TODAY-DAY OF DISCHARGE:  Subjective:   [name] today has no headache,no chest abdominal pain,no new weakness tingling or numbness, feels much better wants to go home today. She is so much more awake and alert this morning!!  Objective:    Blood pressure 177/98, pulse 67, temperature 98.9 F (37.2 C), temperature source Oral, resp. rate 16, height 5\' 2"  (1.575 m), weight 102.8 kg (226 lb 10.1 oz), SpO2 98.00%.  Intake/Output Summary (Last 24 hours) at 09/23/12 0855 Last data filed at 09/23/12 0613  Gross per 24 hour  Intake 1921.25 ml  Output   1725 ml  Net 196.25 ml  Filed Weights   09/20/12 0500 09/21/12 0536 09/23/12 0439  Weight: 96.6 kg (212 lb 15.4 oz) 96.9 kg (213 lb 10 oz) 102.8 kg (226 lb 10.1 oz)    Exam Awake Alert, Oriented *3,  Normal affect [place].AT,PERRAL Supple Neck,No JVD, No cervical lymphadenopathy appriciated.  Symmetrical Chest wall movement, Good air movement bilaterally, CTAB RRR,No Gallops,Rubs or new Murmurs, No Parasternal Heave +ve B.Sounds, Abd Soft, Non tender, No organomegaly appriciated, No rebound -guarding or rigidity. No Cyanosis, Clubbing or edema, No new Rash or bruise Left sided hemiplegia  DISCHARGE CONDITION: Stable  DISPOSITION: SNF  DISCHARGE INSTRUCTIONS:    Activity:  As tolerated with Full fall precautions use walker/cane & assistance as needed  Diet recommendation: Heart Healthy diet       Discharge Orders   Future Orders Complete By Expires     Call MD for:  difficulty breathing, headache or visual disturbances  As directed     Diet - low sodium heart healthy  As directed     Increase activity slowly  As directed        Follow-up Information   Follow up with Terald Sleeper, MD. Schedule an appointment as soon as possible for a visit in 1 week.   Contact information:   [address] [place] [address] [phone]         Total Time spent on discharge equals 45 minutes.  SignedJeoffrey Massed 09/23/2012 8:55 AM

## 2012-09-23 NOTE — Clinical Social Work Note (Signed)
Patient discharged back to [facility] skilled nursing facility today. Facility advised and medical packet compiled and will accompany patient to facility. Ms. Birkeland will transport to facility via ambulance.  Genelle Bal, MSW, LCSW [phone]

## 2012-09-23 NOTE — Progress Notes (Addendum)
Report given to nurse, BJ  at [facility]. # [id number].  SW has info packet together and transport has been called for. Await for their arrival. Pt returning to [facility] where she was living PTA.  Pt will discharge with foley cath per Dr Jerral Ralph.

## 2012-09-28 ENCOUNTER — Non-Acute Institutional Stay (SKILLED_NURSING_FACILITY): Payer: PRIVATE HEALTH INSURANCE | Admitting: Internal Medicine

## 2012-09-28 DIAGNOSIS — I1310 Hypertensive heart and chronic kidney disease without heart failure, with stage 1 through stage 4 chronic kidney disease, or unspecified chronic kidney disease: Secondary | ICD-10-CM

## 2012-09-28 DIAGNOSIS — I69959 Hemiplegia and hemiparesis following unspecified cerebrovascular disease affecting unspecified side: Secondary | ICD-10-CM

## 2012-09-28 DIAGNOSIS — J9620 Acute and chronic respiratory failure, unspecified whether with hypoxia or hypercapnia: Secondary | ICD-10-CM

## 2012-09-28 NOTE — Progress Notes (Signed)
Patient ID: [name], female   DOB: 30-Aug-1955, 57 y.o.   MRN: [id number] Facility; [facility]. Chief complaint review of medical issues, review of hospitalization. Ranitidine [facility] visit for june History; this is a 57 year old woman was in the facility since 2008. 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 She also is very little use of her right leg. She has a, which at one point in time, was loaded with fentanyl and baclofen. I note the fentanyl was changed to morphine. On this occasion. She was hospitalized at [facility] for respiratory failure on May 26. She received marked Narcan. She later experienced seizure activity and was placed on a ventilator. Neurology was consulted. She received IV Depacon. She was also treated for an Escherichia coli UTI.  Past Medical History  Diagnosis Date  . Hypertension   . Seizure disorder   . Metabolic encephalopathy 05/06/2010  . Respiratory failure 05/06/10  . UTI (lower urinary tract infection) 05/06/10  . 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 Sleep apnea 04/2010    on CPAP, "severe central sleep apnea"  . 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 Chronic pain   . Depression   . 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 Spasticity     chronic  . Diabetes mellitus without complication   . Thrombocytopenia, unspecified 07/28/2012  . Acute respiratory failure 07/27/2012  . Narcotic overdose 07/27/2012  . 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 Unspecified hypothyroidism   . Sleep apnea   . Seizures   . Arthritis     OSTEO LEFT LEG   Past Surgical History  Procedure Laterality Date  . Intrathecal pump implantation  2010    [employer]:  fentanyl, baclofen changed to morphine and baclofen on 07/2012  . Cerebral aneurysm repair  1999   Medication list is reviewed.  Social history long-standing resident of  this facility. She remains a full code.  Physical exam O2 sat is 94% on room and her respirations 18, pulse 76. She is afebrile.  Respiratory shallow, but otherwise clear entry bilaterally work of breathing is normal. Cardiac heart sounds are normal. There is no murmurs. No signs of CHF. Neurologic; dense left hemiparesis. She has a tremor of her right hand. Some minimal use of her right leg. Abdomen obese. No masses. No tenderness.  Prescience/plan #1 respiratory failure secondary to inadvertent excessive morphine. I think the morphine should be removed as a matter, fact I've never seen any particular benefit to the pump from either a cane or a spasticity. Point of view. #2 chronic renal failure. #3 seizure disorder. She had a seizure in the hospital. She is on Keppra and Depakote. #4 right arm tremor. I don't believe this is new. #5 obstructive and central sleep apnea. She is on BiPAP.  This is one of a series of hospitalizations for this problem. As I understand things the narcotic is taken out of the pump. This is long overdue

## 2012-11-06 ENCOUNTER — Non-Acute Institutional Stay (SKILLED_NURSING_FACILITY): Payer: PRIVATE HEALTH INSURANCE | Admitting: Internal Medicine

## 2012-11-06 DIAGNOSIS — I69959 Hemiplegia and hemiparesis following unspecified cerebrovascular disease affecting unspecified side: Secondary | ICD-10-CM

## 2012-11-06 DIAGNOSIS — J9620 Acute and chronic respiratory failure, unspecified whether with hypoxia or hypercapnia: Secondary | ICD-10-CM

## 2012-11-06 NOTE — Progress Notes (Signed)
  Patient ID: [name], female   DOB: 25-May-1955, 57 y.o.   MRN: [id number] f  Facility [facility]  Chief complaint review of medical issues and routine [facility] visit for July His; this is a patient who has been in the facility for many years. 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 She has very little use of her right leg as well. At some point she had an internal thecal pump loaded with baclofen and fentanyl. I gather the pump has been changed to morphine. She has had several episodes of respiratory failure related to this. She was hospitalized in July for this but was also felt to have a UTI treated with Invanz. Previous hospitalization in May with acute encephalopathy secondary to unintentional narcotic/opiate overdose in the setting of a chronic baclofen pump. She is also felt to have central sleep apnea. She is followed by Dr.kahn for her chronic pain syndrome.  Past Medical History  Diagnosis Date  . Hypertension   . Seizure disorder   . Metabolic encephalopathy 05/06/2010  . Respiratory failure 05/06/10  . UTI (lower urinary tract infection) 05/06/10  . 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 Sleep apnea 04/2010    on CPAP, "severe central sleep apnea"  . 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 Chronic pain   . Depression   . 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 Spasticity     chronic  . Diabetes mellitus without complication   . Thrombocytopenia, unspecified 07/28/2012  . Acute respiratory failure 07/27/2012  . Narcotic overdose 07/27/2012  . 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 Unspecified hypothyroidism   . Sleep apnea   . Seizures   . Arthritis     OSTEO LEFT LEG   Past Surgical History  Procedure Laterality Date  . Intrathecal pump implantation  2010    [employer]:  fentanyl, baclofen changed to morphine and baclofen on 07/2012  . Cerebral aneurysm  repair  1999   Medication list is reviewed.  Social history long-standing resident of this facility. She remains a full code.  Physical exam O2 sat is 92% on room and her respirations 18, pulse 74. She is afebrile.  Respiratory shallow, but otherwise clear entry bilaterally work of breathing is normal. Cardiac heart sounds are normal. There is no murmurs. No signs of CHF. Neurologic; dense left hemiparesis. She has a tremor of her right hand. Some minimal use of her right leg. Abdomen obese. No masses. No tenderness.  Prescience/plan #1 respiratory failure secondary to inadvertent excessive narcotics. This is not the first time this has happened. Would like to discuss this with Dr. Park Breed #2 chronic renal failure. #3 seizure disorder. She had a seizure in the hospital. She is on Keppra and Depakote. #4 right arm tremor. I don't believe this is new. #5 obstructive and central sleep apnea. She is on BiPAP.  She is also receiving Botox???

## 2012-11-18 ENCOUNTER — Encounter (HOSPITAL_COMMUNITY): Payer: Self-pay | Admitting: Emergency Medicine

## 2012-11-18 ENCOUNTER — Emergency Department (HOSPITAL_COMMUNITY): Payer: Medicare Other

## 2012-11-18 ENCOUNTER — Inpatient Hospital Stay (HOSPITAL_COMMUNITY)
Admission: EM | Admit: 2012-11-18 | Discharge: 2012-11-23 | DRG: 871 | Disposition: A | Discharge status: Skilled Nursing Facility | Payer: Medicare Other | Attending: Internal Medicine | Admitting: Internal Medicine

## 2012-11-18 DIAGNOSIS — J9600 Acute respiratory failure, unspecified whether with hypoxia or hypercapnia: Secondary | ICD-10-CM

## 2012-11-18 DIAGNOSIS — Z79899 Other long term (current) drug therapy: Secondary | ICD-10-CM

## 2012-11-18 DIAGNOSIS — I69354 Hemiplegia and hemiparesis following cerebral infarction affecting left non-dominant side: Secondary | ICD-10-CM

## 2012-11-18 DIAGNOSIS — Z2989 Encounter for other specified prophylactic measures: Secondary | ICD-10-CM

## 2012-11-18 DIAGNOSIS — G40909 Epilepsy, unspecified, not intractable, without status epilepticus: Secondary | ICD-10-CM | POA: Diagnosis present

## 2012-11-18 DIAGNOSIS — D696 Thrombocytopenia, unspecified: Secondary | ICD-10-CM | POA: Diagnosis present

## 2012-11-18 DIAGNOSIS — G4737 Central sleep apnea in conditions classified elsewhere: Secondary | ICD-10-CM | POA: Diagnosis present

## 2012-11-18 DIAGNOSIS — E785 Hyperlipidemia, unspecified: Secondary | ICD-10-CM | POA: Diagnosis present

## 2012-11-18 DIAGNOSIS — I69959 Hemiplegia and hemiparesis following unspecified cerebrovascular disease affecting unspecified side: Secondary | ICD-10-CM

## 2012-11-18 DIAGNOSIS — A419 Sepsis, unspecified organism: Principal | ICD-10-CM | POA: Diagnosis present

## 2012-11-18 DIAGNOSIS — G4731 Primary central sleep apnea: Secondary | ICD-10-CM

## 2012-11-18 DIAGNOSIS — F3289 Other specified depressive episodes: Secondary | ICD-10-CM | POA: Diagnosis present

## 2012-11-18 DIAGNOSIS — E876 Hypokalemia: Secondary | ICD-10-CM | POA: Diagnosis not present

## 2012-11-18 DIAGNOSIS — I69920 Aphasia following unspecified cerebrovascular disease: Secondary | ICD-10-CM

## 2012-11-18 DIAGNOSIS — Z6838 Body mass index (BMI) 38.0-38.9, adult: Secondary | ICD-10-CM

## 2012-11-18 DIAGNOSIS — I959 Hypotension, unspecified: Secondary | ICD-10-CM

## 2012-11-18 DIAGNOSIS — E119 Type 2 diabetes mellitus without complications: Secondary | ICD-10-CM | POA: Diagnosis present

## 2012-11-18 DIAGNOSIS — E870 Hyperosmolality and hypernatremia: Secondary | ICD-10-CM | POA: Diagnosis present

## 2012-11-18 DIAGNOSIS — Z7982 Long term (current) use of aspirin: Secondary | ICD-10-CM

## 2012-11-18 DIAGNOSIS — N200 Calculus of kidney: Secondary | ICD-10-CM | POA: Diagnosis present

## 2012-11-18 DIAGNOSIS — Z8673 Personal history of transient ischemic attack (TIA), and cerebral infarction without residual deficits: Secondary | ICD-10-CM

## 2012-11-18 DIAGNOSIS — I10 Essential (primary) hypertension: Secondary | ICD-10-CM | POA: Diagnosis present

## 2012-11-18 DIAGNOSIS — G8929 Other chronic pain: Secondary | ICD-10-CM | POA: Diagnosis present

## 2012-11-18 DIAGNOSIS — Z418 Encounter for other procedures for purposes other than remedying health state: Secondary | ICD-10-CM

## 2012-11-18 DIAGNOSIS — E039 Hypothyroidism, unspecified: Secondary | ICD-10-CM | POA: Diagnosis present

## 2012-11-18 DIAGNOSIS — A498 Other bacterial infections of unspecified site: Secondary | ICD-10-CM | POA: Diagnosis present

## 2012-11-18 DIAGNOSIS — M129 Arthropathy, unspecified: Secondary | ICD-10-CM | POA: Diagnosis present

## 2012-11-18 DIAGNOSIS — N390 Urinary tract infection, site not specified: Secondary | ICD-10-CM | POA: Diagnosis present

## 2012-11-18 DIAGNOSIS — R0689 Other abnormalities of breathing: Secondary | ICD-10-CM

## 2012-11-18 DIAGNOSIS — G894 Chronic pain syndrome: Secondary | ICD-10-CM | POA: Diagnosis present

## 2012-11-18 DIAGNOSIS — F329 Major depressive disorder, single episode, unspecified: Secondary | ICD-10-CM | POA: Diagnosis present

## 2012-11-18 LAB — CBC WITH DIFFERENTIAL/PLATELET
Basophils Absolute: 0 K/uL (ref 0.0–0.1)
Basophils Relative: 0 % (ref 0–1)
Eosinophils Absolute: 0 10*3/uL (ref 0.0–0.7)
Eosinophils Relative: 1 % (ref 0–5)
HCT: 37.2 % (ref 36.0–46.0)
Hemoglobin: 11.5 g/dL — ABNORMAL LOW (ref 12.0–15.0)
Lymphocytes Relative: 62 % — ABNORMAL HIGH (ref 12–46)
Lymphs Abs: 2.7 10*3/uL (ref 0.7–4.0)
MCH: 29.1 pg (ref 26.0–34.0)
MCHC: 30.9 g/dL (ref 30.0–36.0)
MCV: 94.2 fL (ref 78.0–100.0)
Monocytes Absolute: 0.4 K/uL (ref 0.1–1.0)
Monocytes Relative: 9 % (ref 3–12)
Neutro Abs: 1.2 K/uL — ABNORMAL LOW (ref 1.7–7.7)
Neutrophils Relative %: 28 % — ABNORMAL LOW (ref 43–77)
Platelets: 57 10*3/uL — ABNORMAL LOW (ref 150–400)
RBC: 3.95 MIL/uL (ref 3.87–5.11)
RDW: 16.7 % — ABNORMAL HIGH (ref 11.5–15.5)
WBC: 4.3 10*3/uL (ref 4.0–10.5)

## 2012-11-18 LAB — BASIC METABOLIC PANEL WITH GFR
Calcium: 8.4 mg/dL (ref 8.4–10.5)
Chloride: 112 meq/L (ref 96–112)
Creatinine, Ser: 0.63 mg/dL (ref 0.50–1.10)
GFR calc Af Amer: >90 mL/min (ref >90)

## 2012-11-18 LAB — TSH: TSH: 5.255 u[IU]/mL — ABNORMAL HIGH (ref 0.350–4.500)

## 2012-11-18 LAB — BASIC METABOLIC PANEL
BUN: 13 mg/dL (ref 6–23)
CO2: 30 mEq/L (ref 19–32)
GFR calc non Af Amer: >90 mL/min (ref >90)
Glucose, Bld: 87 mg/dL (ref 70–99)
Potassium: 3.3 mEq/L — ABNORMAL LOW (ref 3.5–5.1)
Sodium: 150 mEq/L — ABNORMAL HIGH (ref 135–145)

## 2012-11-18 LAB — CG4 I-STAT (LACTIC ACID): Lactic Acid, Venous: 1.55 mmol/L (ref 0.5–2.2)

## 2012-11-18 LAB — TROPONIN I: Troponin I: <0.3 ng/mL (ref <0.30)

## 2012-11-18 MED ORDER — ONDANSETRON HCL 4 MG/2ML IJ SOLN: 4.0000 mg | Freq: Four times a day (QID) | INTRAMUSCULAR | Status: DC | PRN

## 2012-11-18 MED ORDER — VITAMIN D3 25 MCG (1000 UNIT) PO TABS
3000.0000 [IU] | ORAL_TABLET | Freq: Every morning | ORAL | Status: DC
  Administered 2012-11-21 – 2012-11-23 (×3): 3000 [IU] via ORAL
  Filled 2012-11-18 (×5): qty 3

## 2012-11-18 MED ORDER — DEXTROSE 5 % IV SOLN
1.0000 g | INTRAVENOUS | Status: DC
  Filled 2012-11-18: qty 10

## 2012-11-18 MED ORDER — SODIUM CHLORIDE 0.9 % IV BOLUS (SEPSIS)
1000.0000 mL | Freq: Once | INTRAVENOUS | Status: AC
  Administered 2012-11-18: 1000 mL via INTRAVENOUS

## 2012-11-18 MED ORDER — ACETAMINOPHEN 650 MG RE SUPP: 650.0000 mg | Freq: Four times a day (QID) | RECTAL | Status: DC | PRN

## 2012-11-18 MED ORDER — CAMPHOR-MENTHOL 0.5-0.5 % EX LOTN
1.0000 "application " | TOPICAL_LOTION | CUTANEOUS | Status: DC | PRN
  Filled 2012-11-18: qty 222

## 2012-11-18 MED ORDER — LEVETIRACETAM 750 MG PO TABS
2000.0000 mg | ORAL_TABLET | Freq: Two times a day (BID) | ORAL | Status: DC
  Administered 2012-11-18: 2000 mg via ORAL
  Filled 2012-11-18 (×3): qty 1

## 2012-11-18 MED ORDER — BENZOCAINE 10 % MT GEL
1.0000 "application " | OROMUCOSAL | Status: DC | PRN
  Filled 2012-11-18: qty 9.4

## 2012-11-18 MED ORDER — BISACODYL 10 MG RE SUPP: 10.0000 mg | Freq: Every day | RECTAL | Status: DC | PRN

## 2012-11-18 MED ORDER — ONDANSETRON HCL 4 MG PO TABS: 4.0000 mg | ORAL_TABLET | Freq: Four times a day (QID) | ORAL | Status: DC | PRN

## 2012-11-18 MED ORDER — IPRATROPIUM BROMIDE 0.02 % IN SOLN: 0.5000 mg | Freq: Four times a day (QID) | RESPIRATORY_TRACT | Status: DC | PRN

## 2012-11-18 MED ORDER — PANTOPRAZOLE SODIUM 40 MG PO TBEC: 40.0000 mg | DELAYED_RELEASE_TABLET | Freq: Every day | ORAL | Status: DC

## 2012-11-18 MED ORDER — ACETAMINOPHEN 325 MG PO TABS: 650.0000 mg | ORAL_TABLET | Freq: Four times a day (QID) | ORAL | Status: DC | PRN

## 2012-11-18 MED ORDER — POLYETHYLENE GLYCOL 3350 17 G PO PACK
17.0000 g | PACK | Freq: Every day | ORAL | Status: DC
  Filled 2012-11-18: qty 1

## 2012-11-18 MED ORDER — POTASSIUM CHLORIDE IN NACL 20-0.9 MEQ/L-% IV SOLN
INTRAVENOUS | Status: DC
  Administered 2012-11-19 (×2): via INTRAVENOUS
  Filled 2012-11-18 (×5): qty 1000

## 2012-11-18 MED ORDER — ZOLPIDEM TARTRATE 5 MG PO TABS: 2.5000 mg | ORAL_TABLET | Freq: Every evening | ORAL | Status: DC | PRN

## 2012-11-18 MED ORDER — DIVALPROEX SODIUM 500 MG PO DR TAB
500.0000 mg | DELAYED_RELEASE_TABLET | Freq: Two times a day (BID) | ORAL | Status: DC
  Administered 2012-11-18: 500 mg via ORAL
  Filled 2012-11-18 (×3): qty 1

## 2012-11-18 MED ORDER — LEVOTHYROXINE SODIUM 137 MCG PO TABS
68.5000 ug | ORAL_TABLET | Freq: Every day | ORAL | Status: DC
  Filled 2012-11-18 (×3): qty 0.5

## 2012-11-18 MED ORDER — LORAZEPAM 2 MG/ML IJ SOLN
1.5000 mg | INTRAMUSCULAR | Status: DC | PRN
Start: 2012-11-18 — End: 2012-11-19

## 2012-11-18 MED ORDER — DEXTROSE 5 % IV SOLN
1.0000 g | Freq: Once | INTRAVENOUS | Status: AC
  Administered 2012-11-18: 1 g via INTRAVENOUS
  Filled 2012-11-18: qty 10

## 2012-11-18 MED ORDER — IPRATROPIUM-ALBUTEROL 0.5-2.5 (3) MG/3ML IN SOLN: 3.0000 mL | Freq: Four times a day (QID) | RESPIRATORY_TRACT | Status: DC | PRN

## 2012-11-18 MED ORDER — DULOXETINE HCL 60 MG PO CPEP
60.0000 mg | ORAL_CAPSULE | Freq: Every day | ORAL | Status: DC
  Filled 2012-11-18: qty 1

## 2012-11-18 MED ORDER — OLOPATADINE HCL 0.1 % OP SOLN
1.0000 [drp] | Freq: Two times a day (BID) | OPHTHALMIC | Status: DC | PRN
  Filled 2012-11-18: qty 5

## 2012-11-18 MED ORDER — OCUSOFT LID SCRUB EX PADS: 1.0000 | MEDICATED_PAD | Freq: Every morning | CUTANEOUS | Status: DC

## 2012-11-18 MED ORDER — SODIUM CHLORIDE 0.9 % IV SOLN
INTRAVENOUS | Status: AC
  Administered 2012-11-18: 21:00:00 via INTRAVENOUS

## 2012-11-18 MED ORDER — TRIAMCINOLONE ACETONIDE 0.1 % EX CREA
1.0000 "application " | TOPICAL_CREAM | Freq: Two times a day (BID) | CUTANEOUS | Status: DC | PRN
  Filled 2012-11-18: qty 15

## 2012-11-18 MED ORDER — SIMVASTATIN 20 MG PO TABS
20.0000 mg | ORAL_TABLET | Freq: Every day | ORAL | Status: DC
  Administered 2012-11-18: 20 mg via ORAL
  Filled 2012-11-18 (×2): qty 1

## 2012-11-18 MED ORDER — ALBUTEROL SULFATE (5 MG/ML) 0.5% IN NEBU: 2.5000 mg | INHALATION_SOLUTION | Freq: Four times a day (QID) | RESPIRATORY_TRACT | Status: DC | PRN

## 2012-11-18 MED ORDER — ASPIRIN EC 81 MG PO TBEC
81.0000 mg | DELAYED_RELEASE_TABLET | Freq: Every day | ORAL | Status: DC
  Filled 2012-11-18: qty 1

## 2012-11-18 NOTE — Progress Notes (Signed)
Pt tx ED, sys in 80s, MD/N verbalized to keep sys BP greater than 90, and to tx to stepdown, report given to oncoming RN, nursing will cont to monitor, Night Coverage also called

## 2012-11-18 NOTE — H&P (Addendum)
[facility] History and Physical  [name] ZOX:[id number] DOB: May 11, 1955 DOA: 11/18/2012  Referring physician:  PCP: Rita Sleeper, MD  Specialists: no   Chief Complaint: hypotension   HPI: [name] is a 57 y.o. female with PMH of HTN, , hypothyroidism, HPL, h/o CVA with R sided paralysis, recurrent UTIs was at neurology appt at [facility] today, found to have progressively declining BP while in the office and brought to the ED; She was given two liter of NS in ED with mild improvement in BP response; Patient denies fever, chills, no cough, no chest pain, no nausea vomiting diarrhea;  -she had cath in ED with no significant urine in bladder, small amount of thick, purulent drainage from bladder cath; started on atx in ED for possible UTI/sepsis    Review of Systems: The patient denies anorexia, fever, weight loss,, vision loss, decreased hearing, hoarseness, chest pain, syncope, dyspnea on exertion, balance deficits, hemoptysis, abdominal pain, melena, hematochezia, severe indigestion/heartburn, hematuria, incontinence, genital sores, muscle weakness, suspicious skin lesions,  depression, unusual weight change, abnormal bleeding, enlarged lymph nodes, angioedema, and breast masses.    Past Medical History  Diagnosis Date  . Hypertension   . Seizure disorder   . Metabolic encephalopathy 05/06/2010  . Respiratory failure 05/06/10  . UTI (lower urinary tract infection) 05/06/10  . 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 Sleep apnea 04/2010    on CPAP, "severe central sleep apnea"  . 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 Chronic pain   . Depression   . 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 Spasticity     chronic  . Diabetes mellitus without complication   . Thrombocytopenia, unspecified 07/28/2012  . Acute respiratory failure 07/27/2012  . Narcotic overdose 07/27/2012  . Hemiparesis affecting left side as late effect of cerebrovascular  accident   . Unspecified hypothyroidism   . Sleep apnea   . Seizures   . Arthritis     OSTEO LEFT LEG   Past Surgical History  Procedure Laterality Date  . Intrathecal pump implantation  2010    [employer]:  fentanyl, baclofen changed to morphine and baclofen on 07/2012  . Cerebral aneurysm repair  1999   Social History:  reports that she has never smoked. She has never used smokeless tobacco. She reports that she does not drink alcohol or use illicit drugs. Nursing home patient  NO: Can patient participate in ADLs?  Allergies  Allergen Reactions  . Codeine Other (See Comments)    unknown    History reviewed. No pertinent family history.  (be sure to complete)  Prior to Admission medications   Medication Sig Start Date End Date Taking? Authorizing Provider  Acetaminophen (TYLENOL) 167 MG/5ML LIQD Take 1,000 mg by mouth every 8 (eight) hours as needed (for pain).   Yes Historical Provider, MD  amLODipine (NORVASC) 5 MG tablet Take 5 mg by mouth daily.   Yes Historical Provider, MD  aspirin EC 81 MG tablet Take 81 mg by mouth daily.   Yes Historical Provider, MD  benzocaine (ANBESOL) 10 % mucosal gel Use as directed 1 application in the mouth or throat as needed for pain.   Yes Historical Provider, MD  bisacodyl (DULCOLAX) 10 MG suppository Place 10 mg rectally daily as needed for constipation.   Yes Historical Provider, MD  camphor-menthol Wynelle Fanny) lotion Apply 1 application topically as needed for itching.   Yes Historical Provider, MD  cholecalciferol (VITAMIN D) 1000  UNITS tablet Take 3,000 Units by mouth every morning.   Yes Historical Provider, MD  cloNIDine (CATAPRES - DOSED IN MG/24 HR) 0.1 mg/24hr patch Place 1 patch (0.1 mg total) onto the skin once a week. 08/09/12  Yes Jeanella Craze, NP  cloNIDine (CATAPRES) 0.1 MG tablet Take 0.1 mg by mouth every 6 (six) hours as needed (for SBP greater than 160).   Yes Historical Provider, MD  divalproex (DEPAKOTE) 500 MG DR tablet Take  500 mg by mouth 2 (two) times daily.   Yes Historical Provider, MD  divalproex (DEPAKOTE) 500 MG DR tablet Take 500 mg by mouth 2 (two) times daily.   Yes Historical Provider, MD  DULoxetine (CYMBALTA) 60 MG capsule Take 60 mg by mouth daily.   Yes Historical Provider, MD  Eyelid Cleansers (OCUSOFT LID SCRUB) PADS Place 1 each into both eyes every morning.   Yes Historical Provider, MD  hydrOXYzine (ATARAX/VISTARIL) 25 MG tablet Take 12.5 mg by mouth daily as needed for itching or anxiety.   Yes Historical Provider, MD  ipratropium-albuterol (DUONEB) 0.5-2.5 (3) MG/3ML SOLN Take 3 mLs by nebulization every 6 (six) hours as needed (for shortness of breath/wheezing).   Yes Historical Provider, MD  labetalol (NORMODYNE) 200 MG tablet Take 200 mg by mouth 2 (two) times daily.   Yes Historical Provider, MD  levETIRAcetam (KEPPRA) 1000 MG tablet Take 2,000 mg by mouth 2 (two) times daily.   Yes Historical Provider, MD  levothyroxine (SYNTHROID, LEVOTHROID) 137 MCG tablet Take 0.5 tablets (68.5 mcg total) by mouth daily before breakfast. 08/09/12  Yes Jeanella Craze, NP  LORazepam (ATIVAN) 2 MG/ML injection Inject 1.5 mg into the vein as needed for seizure.   Yes Historical Provider, MD  losartan (COZAAR) 50 MG tablet Take 1 tablet (50 mg total) by mouth at bedtime. 07/31/12  Yes Elliot Cousin, MD  olopatadine (PATANOL) 0.1 % ophthalmic solution Place 1 drop into both eyes 2 (two) times daily as needed for allergies (every morning and as needed).    Yes Historical Provider, MD  omeprazole (PRILOSEC) 20 MG capsule Take 20 mg by mouth daily.   Yes Historical Provider, MD  oxyCODONE (OXY IR/ROXICODONE) 5 MG immediate release tablet Take 5 mg by mouth every 8 (eight) hours as needed for pain (for moderate to severe pain).   Yes Historical Provider, MD  polyethylene glycol (MIRALAX / GLYCOLAX) packet Take 17 g by mouth daily.   Yes Historical Provider, MD  selenium sulfide (SELSUN) 1 % LOTN Apply 1 application  topically 3 (three) times a week.   Yes Historical Provider, MD  simvastatin (ZOCOR) 20 MG tablet Take 20 mg by mouth at bedtime.   Yes Historical Provider, MD  triamcinolone cream (KENALOG) 0.1 % Apply 1 application topically 2 (two) times daily as needed (to rash).   Yes Historical Provider, MD  zolpidem (AMBIEN) 5 MG tablet Take 2.5 mg by mouth at bedtime as needed for sleep.   Yes Historical Provider, MD   Physical Exam: Filed Vitals:   11/18/12 1430  BP: 86/58  Pulse: 65  Temp:   Resp:      General:  Lethargic but arousable, slurred speech at baseline   Eyes: EOMI, perrla   ENT: no oral ulcers   Neck: supple no JVD   Cardiovascular: S1, S2 RRR, syst mr   Respiratory: CTA bl   Abdomen: soft, NT, obese, ND   Skin: no rash   Musculoskeletal: LE edema; per famiyl chronic   Psychiatric:  no hallucination   Neurologic: L sided paralysis, slurred speech, lethargic, oriented to person only   Labs on Admission:  Basic Metabolic Panel:  Recent Labs Lab 11/18/12 1439  NA 150*  K 3.3*  CL 112  CO2 30  GLUCOSE 87  BUN 13  CREATININE 0.63  CALCIUM 8.4   Liver Function Tests: No results found for this basename: AST, ALT, ALKPHOS, BILITOT, PROT, ALBUMIN,  in the last 168 hours No results found for this basename: LIPASE, AMYLASE,  in the last 168 hours No results found for this basename: AMMONIA,  in the last 168 hours CBC:  Recent Labs Lab 11/18/12 1439  WBC 4.3  NEUTROABS 1.2*  HGB 11.5*  HCT 37.2  MCV 94.2  PLT 57*   Cardiac Enzymes:  Recent Labs Lab 11/18/12 1439  TROPONINI <0.30    BNP (last 3 results)  Recent Labs  09/18/12 0340  PROBNP 180.8*   CBG: No results found for this basename: GLUCAP,  in the last 168 hours  Radiological Exams on Admission: Dg Chest Port 1 View  11/18/2012   CLINICAL DATA:  Chest pain, low heart rate, history of stroke  EXAM: PORTABLE CHEST - 1 VIEW  COMPARISON:  09/20/2012  FINDINGS: Study is limited by poor  inspiration. Cardiomediastinal silhouette is stable. Mild basilar atelectasis. Mild elevation of the right hemidiaphragm again noted. No acute infiltrate or pulmonary edema. Stable left basilar scarring.  IMPRESSION: Limited study by poor inspiration. Mild basilar atelectasis. No acute infiltrate or pulmonary edema.   Electronically Signed   By: Natasha Mead   On: 11/18/2012 13:31    EKG: Independently reviewed. No acute changes, NSR  Assessment/Plan Active Problems:   UTI (lower urinary tract infection)   Sepsis   Morbid obesity   History of stroke   Chronic pain disorder   Thrombocytopenia, unspecified   Unspecified hypothyroidism   Seizure disorder   57 y.o. female with PMH of HTN, HPL, hypothyroidism, h/o CVA with R sided paralysis, recurrent UTIs was at neurology appt at [facility] today, found to have progressively declining BP while in the office and brought to the ED; adm,itted with hypotension, possible UTI/sepsis   1. Hypotension, sepsis/UTI; pend UA; being admitted to step down due to hypotension; ? HTN meds overdose  -s/p 2 liters of NS with BP improving;  -cont IVF 125cc+NS bolus 1 liter; maintain spp> 90; if not improved may need to reeval for pressure support in ICU; -cont IV atx; f/u culture/sens liter; check AM cortisol  -pend UA: had cath in ED with no significant urine in bladder, small amount of thick, purulent drainage from bladder cath; started on atx in ED for possible UTI/sepsis   2.HTN currently hypotensive; hold amlodipine, labetolol, losartan, resume low dose clonidine in AM to prevent rebound HTN;  -close monitor   3. Hypo K, Hyper Na; likely dehydration; preserved renal function  -given 2 L IV NS in ED: cont IVF resuscitation; potassium; recheck in AM   4. Thrombocytopenia; chronic mild worse on this admission; ? Sepsis related -no s/s of acute bleeding; monitor  5.  Hypothyroidism,  Last TSH 3.4 (09/2012) -cont current regimen; recheck tsh   6. H/o CVA  with hemiparesis, aphasia mild;  -cont ASA, statin,   DVT prophylaxis; hold heparin due to worsening thrombocytopenia; cont BL pneumatic compressions; recheck platelet in AM if improved resume heparin SQ    Code Status: Full Family Communication: daughter, sister at the bedside  Disposition Plan: snf   Time spent: >  35 minutes   Esperanza Sheets [facility] Pager [phone]  If 7PM-7AM, please contact night-coverage [email or web address] Password [facility] 11/18/2012, 4:42 PM

## 2012-11-18 NOTE — Progress Notes (Signed)
Spoke with MD on call regarding pt's BP. No further interventions to be taken at this time. Pt responsive and follows commands. MAP greater than 60 at this time. Will follow and notify MD as needed for any changes. Also noted pt has a tight abdomen on assessment. Patient has had a BM today and has bowel sounds present. No c/o tenderness. Will continue to monitor this as well.

## 2012-11-18 NOTE — ED Notes (Signed)
Ghim MD notified of rectal temp, warm blankets placed on pt.  Pt refused foley catheter. Ghim MD notified.

## 2012-11-18 NOTE — ED Provider Notes (Signed)
CSN: [id number]     Arrival date & time 11/18/12  1222 History   First MD Initiated Contact with Patient 11/18/12 1252     Chief Complaint  Patient presents with  . Hypotension   (Consider location/radiation/quality/duration/timing/severity/associated sxs/prior Treatment) HPI Comments: Level 5 caveat due to altered mentation.  Pt was in normal state yesterday, communicating, laughing, interactive with family.   Apaprently didn't sleep well last night. Had neurology appt at [facility] today, found to have progressively declining BP while in the office.  Came by EMS to the ED.  Pt received IVF's en route.  Pt's BP remains low.  Pt reports no pain.  Family reports no N/V/D, fevers, chills.  Pt had a prior significant stroke affecting left side, essentially bed bound.    Patient is a 57 y.o. female presenting with weakness. The history is provided by the patient, a relative and medical records. The history is limited by the condition of the patient.  Weakness This is a new problem. The current episode started 3 to 5 hours ago. The problem has been gradually worsening. Pertinent negatives include no chest pain, no abdominal pain, no headaches and no shortness of breath.    Past Medical History  Diagnosis Date  . Hypertension   . Seizure disorder   . Metabolic encephalopathy 05/06/2010  . Respiratory failure 05/06/10  . UTI (lower urinary tract infection) 05/06/10  . 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 Sleep apnea 04/2010    on CPAP, "severe central sleep apnea"  . 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 Chronic pain   . Depression   . 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 Spasticity     chronic  . Diabetes mellitus without complication   . Thrombocytopenia, unspecified 07/28/2012  . Acute respiratory failure 07/27/2012  . Narcotic overdose 07/27/2012  . 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 Unspecified hypothyroidism   .  Sleep apnea   . Seizures   . Arthritis     OSTEO LEFT LEG   Past Surgical History  Procedure Laterality Date  . Intrathecal pump implantation  2010    [employer]:  fentanyl, baclofen changed to morphine and baclofen on 07/2012  . Cerebral aneurysm repair  1999   History reviewed. No pertinent family history. History  Substance Use Topics  . Smoking status: Never Smoker   . Smokeless tobacco: Never Used  . Alcohol Use: No   OB History   Grav Para Term Preterm Abortions TAB SAB Ect Mult Living                 Review of Systems  Unable to perform ROS: Mental status change  Respiratory: Negative for shortness of breath.   Cardiovascular: Negative for chest pain.  Gastrointestinal: Negative for abdominal pain.  Neurological: Positive for weakness. Negative for headaches.    Allergies  Codeine  Home Medications   Current Outpatient Rx  Name  Route  Sig  Dispense  Refill  . Acetaminophen (TYLENOL) 167 MG/5ML LIQD   Oral   Take 1,000 mg by mouth every 8 (eight) hours as needed (for pain).         Marland Kitchen amLODipine (NORVASC) 5 MG tablet   Oral   Take 5 mg by mouth daily.         Marland Kitchen aspirin EC 81 MG tablet   Oral   Take 81 mg by mouth daily.         Marland Kitchen  benzocaine (ANBESOL) 10 % mucosal gel   Mouth/Throat   Use as directed 1 application in the mouth or throat as needed for pain.         . bisacodyl (DULCOLAX) 10 MG suppository   Rectal   Place 10 mg rectally daily as needed for constipation.         . camphor-menthol (SARNA) lotion   Topical   Apply 1 application topically as needed for itching.         . cholecalciferol (VITAMIN D) 1000 UNITS tablet   Oral   Take 3,000 Units by mouth every morning.         . cloNIDine (CATAPRES - DOSED IN MG/24 HR) 0.1 mg/24hr patch   Transdermal   Place 1 patch (0.1 mg total) onto the skin once a week.   4 patch   12   . cloNIDine (CATAPRES) 0.1 MG tablet   Oral   Take 0.1 mg by mouth every 6 (six) hours as needed  (for SBP greater than 160).         Marland Kitchen divalproex (DEPAKOTE) 500 MG DR tablet   Oral   Take 500 mg by mouth 2 (two) times daily.         . divalproex (DEPAKOTE) 500 MG DR tablet   Oral   Take 500 mg by mouth 2 (two) times daily.         . DULoxetine (CYMBALTA) 60 MG capsule   Oral   Take 60 mg by mouth daily.         . Eyelid Cleansers (OCUSOFT LID SCRUB) PADS   Both Eyes   Place 1 each into both eyes every morning.         . hydrOXYzine (ATARAX/VISTARIL) 25 MG tablet   Oral   Take 12.5 mg by mouth daily as needed for itching or anxiety.         Marland Kitchen ipratropium-albuterol (DUONEB) 0.5-2.5 (3) MG/3ML SOLN   Nebulization   Take 3 mLs by nebulization every 6 (six) hours as needed (for shortness of breath/wheezing).         . labetalol (NORMODYNE) 200 MG tablet   Oral   Take 200 mg by mouth 2 (two) times daily.         Marland Kitchen levETIRAcetam (KEPPRA) 1000 MG tablet   Oral   Take 2,000 mg by mouth 2 (two) times daily.         Marland Kitchen levothyroxine (SYNTHROID, LEVOTHROID) 137 MCG tablet   Oral   Take 0.5 tablets (68.5 mcg total) by mouth daily before breakfast.         . LORazepam (ATIVAN) 2 MG/ML injection   Intravenous   Inject 1.5 mg into the vein as needed for seizure.         Marland Kitchen losartan (COZAAR) 50 MG tablet   Oral   Take 1 tablet (50 mg total) by mouth at bedtime.         Marland Kitchen olopatadine (PATANOL) 0.1 % ophthalmic solution   Both Eyes   Place 1 drop into both eyes 2 (two) times daily as needed for allergies (every morning and as needed).          Marland Kitchen omeprazole (PRILOSEC) 20 MG capsule   Oral   Take 20 mg by mouth daily.         Marland Kitchen oxyCODONE (OXY IR/ROXICODONE) 5 MG immediate release tablet   Oral   Take 5 mg by mouth every 8 (eight) hours as needed  for pain (for moderate to severe pain).         . polyethylene glycol (MIRALAX / GLYCOLAX) packet   Oral   Take 17 g by mouth daily.         Marland Kitchen selenium sulfide (SELSUN) 1 % LOTN   Topical   Apply 1  application topically 3 (three) times a week.         . simvastatin (ZOCOR) 20 MG tablet   Oral   Take 20 mg by mouth at bedtime.         . triamcinolone cream (KENALOG) 0.1 %   Topical   Apply 1 application topically 2 (two) times daily as needed (to rash).         . zolpidem (AMBIEN) 5 MG tablet   Oral   Take 2.5 mg by mouth at bedtime as needed for sleep.          BP 86/58  Pulse 65  Temp(Src) 96 F (35.6 C) (Rectal)  Resp 12  SpO2 100% Physical Exam  Nursing note and vitals reviewed. Constitutional: She appears well-developed and well-nourished. She appears listless. She is easily aroused.  Non-toxic appearance. No distress.  HENT:  Head: Normocephalic and atraumatic.  Neck: Normal range of motion. Neck supple. No JVD present. No Brudzinski's sign and no Kernig's sign noted.  Cardiovascular: Normal rate, regular rhythm and intact distal pulses.   No murmur heard. Pulmonary/Chest: Effort normal. No accessory muscle usage. No respiratory distress. She has no decreased breath sounds. She has no wheezes. She has no rales.  Abdominal: Soft. She exhibits no distension. There is no tenderness. There is no rebound.  Neurological: She is easily aroused. She appears listless. She exhibits abnormal muscle tone. Coordination abnormal.  Skin: Skin is warm. She is not diaphoretic.  Psychiatric: Her mood appears not anxious. Her affect is not inappropriate. She is slowed. Cognition and memory are impaired.    ED Course  Procedures (including critical care time) Labs Review Labs Reviewed  CBC WITH DIFFERENTIAL - Abnormal; Notable for the following:    Hemoglobin 11.5 (*)    RDW 16.7 (*)    Platelets 57 (*)    Neutrophils Relative % 28 (*)    Neutro Abs 1.2 (*)    Lymphocytes Relative 62 (*)    All other components within normal limits  BASIC METABOLIC PANEL - Abnormal; Notable for the following:    Sodium 150 (*)    Potassium 3.3 (*)    All other components within  normal limits  CULTURE, BLOOD (ROUTINE X 2)  CULTURE, BLOOD (ROUTINE X 2)  URINE CULTURE  TROPONIN I  URINALYSIS, ROUTINE W REFLEX MICROSCOPIC  TSH  CG4 I-STAT (LACTIC ACID)   Imaging Review Dg Chest Port 1 View  11/18/2012   CLINICAL DATA:  Chest pain, low heart rate, history of stroke  EXAM: PORTABLE CHEST - 1 VIEW  COMPARISON:  09/20/2012  FINDINGS: Study is limited by poor inspiration. Cardiomediastinal silhouette is stable. Mild basilar atelectasis. Mild elevation of the right hemidiaphragm again noted. No acute infiltrate or pulmonary edema. Stable left basilar scarring.  IMPRESSION: Limited study by poor inspiration. Mild basilar atelectasis. No acute infiltrate or pulmonary edema.   Electronically Signed   By: Natasha Mead   On: 11/18/2012 13:31    Saturation is 100% and I interpret to be adequate  ECG at time 12:33 shows SR at rate 64, LAD, poor R wave progression lead V1-V4.  Flat t waves, diffusely. Abn ECG.  Flat t waves slightly worse compared to ECG on 07/27/12.     4:12 PM BP remains low, but will awaken and converse.  She refused foley catheter.  Later convinced to consent to in and out cath.  No sig urine in bladder, small amount of thick, purulent drainage from bladder cath.  Given this, will presumptively treat for UTI, and likely pt's hypotension is related to urosepsis or complicated UTI.  Will try to obtain at least urine culture from small specimen.  Will discuss with hospitalist.    MDM   1. Urosepsis      Pt is hypotensive, doesn't feel febrile, no fever here.  No report of N/V/D.  No recent medication change.  Pt is prone to UTI's per family.  No steroids on MAR.  No h/o hypothyroidism, autonomic disorder.  Pt is not tachycardic.  When pt arouses, conversant, answers questions, although memory may be mildly off.  Pt was convinced she could walk and family said she could not.      Gavin Pound. Orel Cooler, MD 11/18/12 1625

## 2012-11-18 NOTE — ED Notes (Addendum)
Pt via EMS for eval of hypotension. Hx seizure, cva.  Was at physician office for botox tx for cva residual deficits, SBP normally in the 130s, MD office was getting SBP 70s and 80s (MD office did not administer botox tx). Asympotmatic hypotension.  ~447mL NS IV.  Last BP for EMS was 82 palpated. Slurred speech is normal for pt post cva, residual L sided weakness. CBG 101. Initially O2 sat 84% on room air, 98% on 2L.  Pt took all her AM meds today.  Pt's daughters are en route.

## 2012-11-18 NOTE — ED Notes (Addendum)
Gave partial report to [employer]. In order to ensure correct patient placement, she is contacting the admitting physician Dr York Spaniel.

## 2012-11-19 ENCOUNTER — Inpatient Hospital Stay (HOSPITAL_COMMUNITY): Payer: Medicare Other

## 2012-11-19 DIAGNOSIS — E039 Hypothyroidism, unspecified: Secondary | ICD-10-CM

## 2012-11-19 DIAGNOSIS — D696 Thrombocytopenia, unspecified: Secondary | ICD-10-CM

## 2012-11-19 DIAGNOSIS — N390 Urinary tract infection, site not specified: Secondary | ICD-10-CM

## 2012-11-19 DIAGNOSIS — A419 Sepsis, unspecified organism: Secondary | ICD-10-CM

## 2012-11-19 LAB — URINALYSIS, ROUTINE W REFLEX MICROSCOPIC
Bilirubin Urine: NEGATIVE
Glucose, UA: NEGATIVE mg/dL
Specific Gravity, Urine: 1.013 (ref 1.005–1.030)
pH: 5 (ref 5.0–8.0)

## 2012-11-19 LAB — BASIC METABOLIC PANEL
BUN: 11 mg/dL (ref 6–23)
Chloride: 117 mEq/L — ABNORMAL HIGH (ref 96–112)
Creatinine, Ser: 0.63 mg/dL (ref 0.50–1.10)
GFR calc Af Amer: >90 mL/min (ref >90)
Glucose, Bld: 77 mg/dL (ref 70–99)
Potassium: 3.7 mEq/L (ref 3.5–5.1)

## 2012-11-19 LAB — CBC
HCT: 38.9 % (ref 36.0–46.0)
Hemoglobin: 11.7 g/dL — ABNORMAL LOW (ref 12.0–15.0)
MCV: 96.5 fL (ref 78.0–100.0)
RDW: 17 % — ABNORMAL HIGH (ref 11.5–15.5)
WBC: 4.7 10*3/uL (ref 4.0–10.5)

## 2012-11-19 LAB — URINE MICROSCOPIC-ADD ON

## 2012-11-19 LAB — CARBOXYHEMOGLOBIN
Carboxyhemoglobin: 1.5 % (ref 0.5–1.5)
O2 Saturation: 77.6 %
Total hemoglobin: 11.9 g/dL — ABNORMAL LOW (ref 12.0–16.0)

## 2012-11-19 LAB — GLUCOSE, CAPILLARY
Glucose-Capillary: 64 mg/dL — ABNORMAL LOW (ref 70–99)
Glucose-Capillary: 79 mg/dL (ref 70–99)
Glucose-Capillary: 81 mg/dL (ref 70–99)

## 2012-11-19 LAB — TYPE AND SCREEN: ABO/RH(D): O POS

## 2012-11-19 LAB — LACTIC ACID, PLASMA: Lactic Acid, Venous: 0.9 mmol/L (ref 0.5–2.2)

## 2012-11-19 LAB — PROCALCITONIN: Procalcitonin: 0.34 ng/mL

## 2012-11-19 LAB — MRSA PCR SCREENING: MRSA by PCR: NEGATIVE

## 2012-11-19 MED ORDER — SODIUM CHLORIDE 0.9 % IV BOLUS (SEPSIS)
500.0000 mL | Freq: Once | INTRAVENOUS | Status: AC
  Administered 2012-11-19: 500 mL via INTRAVENOUS

## 2012-11-19 MED ORDER — DEXTROSE 50 % IV SOLN
25.0000 mL | Freq: Once | INTRAVENOUS | Status: AC
  Administered 2012-11-19: 25 mL via INTRAVENOUS

## 2012-11-19 MED ORDER — SODIUM CHLORIDE 0.9 % IV SOLN
1500.0000 mg | Freq: Two times a day (BID) | INTRAVENOUS | Status: DC
  Administered 2012-11-19 – 2012-11-21 (×5): 1500 mg via INTRAVENOUS
  Filled 2012-11-19 (×6): qty 15

## 2012-11-19 MED ORDER — PIPERACILLIN-TAZOBACTAM 3.375 G IVPB
3.3750 g | Freq: Four times a day (QID) | INTRAVENOUS | Status: DC
Start: 2012-11-19 — End: 2012-11-19
  Administered 2012-11-19: 3.375 g via INTRAVENOUS
  Filled 2012-11-19 (×3): qty 50

## 2012-11-19 MED ORDER — DEXTROSE-NACL 5-0.45 % IV SOLN
INTRAVENOUS | Status: DC
  Administered 2012-11-19: 20 mL/h via INTRAVENOUS

## 2012-11-19 MED ORDER — VALPROATE SODIUM 500 MG/5ML IV SOLN
500.0000 mg | Freq: Two times a day (BID) | INTRAVENOUS | Status: DC
  Administered 2012-11-19 – 2012-11-21 (×5): 500 mg via INTRAVENOUS
  Filled 2012-11-19 (×6): qty 5

## 2012-11-19 MED ORDER — LORAZEPAM 2 MG/ML IJ SOLN: 1.0000 mg | INTRAMUSCULAR | Status: DC | PRN

## 2012-11-19 MED ORDER — DEXTROSE 50 % IV SOLN
INTRAVENOUS | Status: AC
  Filled 2012-11-19: qty 50

## 2012-11-19 MED ORDER — PANTOPRAZOLE SODIUM 40 MG IV SOLR
40.0000 mg | Freq: Every day | INTRAVENOUS | Status: DC
  Administered 2012-11-19: 40 mg via INTRAVENOUS
  Filled 2012-11-19 (×2): qty 40

## 2012-11-19 MED ORDER — SODIUM CHLORIDE 0.9 % IV BOLUS (SEPSIS)
1000.0000 mL | Freq: Once | INTRAVENOUS | Status: AC
  Administered 2012-11-19: 1000 mL via INTRAVENOUS

## 2012-11-19 MED ORDER — MEROPENEM 500 MG IV SOLR
500.0000 mg | Freq: Three times a day (TID) | INTRAVENOUS | Status: DC
  Administered 2012-11-19 – 2012-11-23 (×13): 500 mg via INTRAVENOUS
  Filled 2012-11-19 (×16): qty 0.5

## 2012-11-19 MED ORDER — POTASSIUM CHLORIDE IN NACL 20-0.45 MEQ/L-% IV SOLN
INTRAVENOUS | Status: DC
  Administered 2012-11-19: 09:00:00 via INTRAVENOUS
  Filled 2012-11-19 (×2): qty 1000

## 2012-11-19 MED ORDER — DEXTROSE-NACL 5-0.45 % IV SOLN
INTRAVENOUS | Status: DC
  Administered 2012-11-19: 100 mL/h via INTRAVENOUS
  Administered 2012-11-19: 23:00:00 via INTRAVENOUS
  Administered 2012-11-19: 100 mL/h via INTRAVENOUS
  Administered 2012-11-20 – 2012-11-21 (×2): via INTRAVENOUS

## 2012-11-19 MED ORDER — SODIUM CHLORIDE 0.9 % IV BOLUS (SEPSIS): 1000.0000 mL | INTRAVENOUS | Status: DC | PRN

## 2012-11-19 MED ORDER — NOREPINEPHRINE BITARTRATE 1 MG/ML IJ SOLN
5.0000 ug/min | INTRAVENOUS | Status: DC
  Administered 2012-11-19: 2.5 ug/min via INTRAVENOUS
  Filled 2012-11-19: qty 4

## 2012-11-19 MED ORDER — LEVOTHYROXINE SODIUM 100 MCG IV SOLR
50.0000 ug | Freq: Every day | INTRAVENOUS | Status: DC
  Administered 2012-11-19 – 2012-11-21 (×3): 50 ug via INTRAVENOUS
  Filled 2012-11-19 (×3): qty 5

## 2012-11-19 NOTE — Progress Notes (Signed)
Previous RT had several unsuccessful aline attempts before pt was transferred to 2M11. Per Dr Vassie Loll, no aline is needed at this time. New order put in for CPAP QHS/PRN. There are currently no available CPAP's at this time. RT will set up CPAP for pt when available. RT Will continue to monitor.

## 2012-11-19 NOTE — Progress Notes (Signed)
Per pt's daughter Marcelino Duster, pt's neuro status is not at baseline. Pt is lethargic, slow to respond and is having slight confusion. Daughter states pt is usually very alert and has good verbal interaction with people. Renette Butters, Viona Gilmore

## 2012-11-19 NOTE — Progress Notes (Signed)
[facility] PROGRESS NOTE  [name] GNF:[id number] DOB: Jan 21, 1956 DOA: 11/18/2012 PCP: Terald Sleeper, MD  Assessment/Plan: 57 y.o. female with PMH of HTN, HPL, hypothyroidism, h/o CVA with R sided paralysis, recurrent UTIs was at neurology appt at [facility] today, found to have progressively declining BP while in the office and brought to the ED; admitted with hypotension, possible UTI andsepsis   1. Hypotension, ? sepsis/UTI; pend UA; was admitted to step down due to hypotension;   -still labile BP/hypotensionm despite 6 liter of NS; not improved may need pressure support in ICU; will discuss with critical care  -changed ceftriaxone(9/12) to zosyn (9/13); f/u culture/sens pend AM cortisol  -pend UA: had cath in ED with no significant urine in bladder, small amount of thick, purulent drainage from bladder cath;    2.HTN currently hypotensive; hold amlodipine, labetolol, losartan, resume low dose clonidine when able to prevent rebound HTN;  -close monitor   3. Hypo K, Hyper Na; likely dehydration; preserved renal function  -change NS to1/2 NS (9/13); cont IVF resuscitation; potassium; recheck in AM   4. Thrombocytopenia; chronic mild worse on this admission; ? Sepsis related  -no s/s of acute bleeding; monitor   5. Hypothyroidism, Last TSH 3.4 (09/2012)  -cont current regimen; rechecked tsh 5.2; will obtain free T4; increased levothyroxine to on 9/13  6. H/o CVA with hemiparesis, aphasia mild;  -cont ASA, statin,   DVT prophylaxis; hold heparin due to worsening thrombocytopenia; cont BL pneumatic compressions; recheck platelet in AM if improved resume heparin SQ    Code Status: full  Family Communication: not present at the bedside  (indicate person spoken with, relationship, and if by phone, the number) Disposition Plan: snf    Consultants:  CCM  Procedures:  CXR  -attempted A line per RRT unsuccessfully   Antibiotics: Ceftriaxone  9/12<<<  HPI/Subjective: Still lethargic, BP labile overnight despite IVF resuscitation   Objective: Filed Vitals:   11/19/12 0730  BP: 88/53  Pulse: 73  Temp: 97.5 F (36.4 C)  Resp: 9    Intake/Output Summary (Last 24 hours) at 11/19/12 0817 Last data filed at 11/19/12 0700  Gross per 24 hour  Intake    200 ml  Output    850 ml  Net   -650 ml   Filed Weights   11/18/12 1900  Weight: 94.9 kg (209 lb 3.5 oz)    Exam:   General:  Asrousable, confused   Cardiovascular: S1, S2 RRR   Respiratory: few crackles in LL   Abdomen: soft, obese, NT,   Musculoskeletal: mild LE edema    Data Reviewed: Basic Metabolic Panel:  Recent Labs Lab 11/18/12 1439 11/19/12 0500  NA 150* 153*  K 3.3* 3.7  CL 112 117*  CO2 30 29  GLUCOSE 87 77  BUN 13 11  CREATININE 0.63 0.63  CALCIUM 8.4 7.7*   Liver Function Tests: No results found for this basename: AST, ALT, ALKPHOS, BILITOT, PROT, ALBUMIN,  in the last 168 hours No results found for this basename: LIPASE, AMYLASE,  in the last 168 hours No results found for this basename: AMMONIA,  in the last 168 hours CBC:  Recent Labs Lab 11/18/12 1439 11/19/12 0500  WBC 4.3 4.7  NEUTROABS 1.2*  --   HGB 11.5* 11.7*  HCT 37.2 38.9  MCV 94.2 96.5  PLT 57* 46*   Cardiac Enzymes:  Recent Labs Lab 11/18/12 1439  TROPONINI <0.30   BNP (last 3 results)  Recent Labs  09/18/12  0340  PROBNP 180.8*   CBG: No results found for this basename: GLUCAP,  in the last 168 hours  Recent Results (from the past 240 hour(s))  MRSA PCR SCREENING     Status: None   Collection Time    11/18/12  6:47 PM      Result Value Range Status   MRSA by PCR NEGATIVE  NEGATIVE Final   Comment:            The GeneXpert MRSA Assay (FDA     approved for NASAL specimens     only), is one component of a     comprehensive MRSA colonization     surveillance program. It is not     intended to diagnose MRSA     infection nor to guide or      monitor treatment for     MRSA infections.     Studies: Dg Chest Port 1 View  11/18/2012   CLINICAL DATA:  Chest pain, low heart rate, history of stroke  EXAM: PORTABLE CHEST - 1 VIEW  COMPARISON:  09/20/2012  FINDINGS: Study is limited by poor inspiration. Cardiomediastinal silhouette is stable. Mild basilar atelectasis. Mild elevation of the right hemidiaphragm again noted. No acute infiltrate or pulmonary edema. Stable left basilar scarring.  IMPRESSION: Limited study by poor inspiration. Mild basilar atelectasis. No acute infiltrate or pulmonary edema.   Electronically Signed   By: Natasha Mead   On: 11/18/2012 13:31    Scheduled Meds: . sodium chloride   Intravenous STAT  . aspirin EC  81 mg Oral Daily  . cefTRIAXone (ROCEPHIN)  IV  1 g Intravenous Q24H  . cholecalciferol  3,000 Units Oral q morning - 10a  . divalproex  500 mg Oral BID  . DULoxetine  60 mg Oral Daily  . levETIRAcetam  2,000 mg Oral BID  . levothyroxine  68.5 mcg Oral QAC breakfast  . pantoprazole  40 mg Oral Daily  . polyethylene glycol  17 g Oral Daily  . simvastatin  20 mg Oral QHS   Continuous Infusions: . 0.9 % NaCl with KCl 20 mEq / L 200 mL/hr at 11/19/12 3086    Active Problems:   UTI (lower urinary tract infection)   Sepsis   Morbid obesity   History of stroke   Chronic pain disorder   Thrombocytopenia, unspecified   Unspecified hypothyroidism   Seizure disorder    Time spent: > 35 minutes     Esperanza Sheets  [facility] Pager [phone]. If 7PM-7AM, please contact night-coverage at [email or web address], password [facility] 11/19/2012, 8:17 AM  LOS: 1 day

## 2012-11-19 NOTE — Procedures (Signed)
Central Venous Catheter Insertion Procedure Note [name] [id number] [birth year]  Procedure: Insertion of Central Venous Catheter Indications: Assessment of intravascular volume and Drug and/or fluid administration  Procedure Details Consent: Risks of procedure as well as the alternatives and risks of each were explained to the (patient/caregiver).  Consent for procedure obtained. Time Out: Verified patient identification, verified procedure, site/side was marked, verified correct patient position, special equipment/implants available, medications/allergies/relevent history reviewed, required imaging and test results available.  Performed  Maximum sterile technique was used including antiseptics, cap, gloves, gown, hand hygiene, mask and sheet. Skin prep: Chlorhexidine; local anesthetic administered A antimicrobial bonded/coated triple lumen catheter was placed in the left internal jugular vein using the Seldinger technique. Catheter difficult to thread, secured at 15cm. CXR pending   Evaluation Blood flow good Complications: No apparent complications Patient did tolerate procedure well. Chest X-ray ordered to verify placement.  CXR: pending.  Performed using ultrasound guidance under direct MD supervision.   WHITEHEART,KATHRYN, NP 11/19/2012, 11:19 AM  Ultrasound used for site verification, live visualisation of needle entry & guidewire prior to dilation   [name].

## 2012-11-19 NOTE — Consult Note (Signed)
PULMONARY  / CRITICAL CARE MEDICINE  Name: [name] MRN: [id number] DOB: 03/31/1955    ADMISSION DATE:  11/18/2012 CONSULTATION DATE:  11/19/12  REFERRING MD :  York Spaniel  PRIMARY SERVICE: [facility] -> P[facility]   CHIEF COMPLAINT:  Hypotension, sepsis  BRIEF PATIENT DESCRIPTION: 57 yo female SNF resident admitted 9/12 with ?UTI, sepsis.  Developed hypotension inspite of 4L fluids, septic shock 9/13 and PCCM consulted.  PMhx HTN, seizure disorder, OSA, CVA, recurrent UTI with resistant e.coli 09/2012  SIGNIFICANT EVENTS / STUDIES:    LINES / TUBES:   CULTURES: (Hx ESBL Ecoli UTI) Urine 9/12>>> BCx2 9/12>>>   ANTIBIOTICS: Zosyn 9/12>>>9/13 Meropenem 9/13>>>  HISTORY OF PRESENT ILLNESS:  57 yo female SNF resident with hx HTN, seizure disorder, OSA, CVA, recurrent UTI presented 9/12 from a neurology appointment where she was found to have progressively dropping BP while in the office. In ER had minimal uop with purulent drainage from catheter.  Admitted by [facility] with UTI, sepsis.  Started on Zosyn.  Overnight cont to have hypotensions despite >4L fluids and PCCM consulted. Pt denies pain currently.  Difficult to obtain hx.  Husband not available.   PAST MEDICAL HISTORY :  Past Medical History  Diagnosis Date  . Hypertension   . Seizure disorder   . Metabolic encephalopathy 05/06/2010  . Respiratory failure 05/06/10  . UTI (lower urinary tract infection) 05/06/10  . 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 Sleep apnea 04/2010    on CPAP, "severe central sleep apnea"  . 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 Chronic pain   . Depression   . 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 Spasticity     chronic  . Diabetes mellitus without complication   . Thrombocytopenia, unspecified 07/28/2012  . Acute respiratory failure 07/27/2012  . Narcotic overdose 07/27/2012  . 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 Unspecified  hypothyroidism   . Sleep apnea   . Seizures   . Arthritis     OSTEO LEFT LEG   Past Surgical History  Procedure Laterality Date  . Intrathecal pump implantation  2010    [employer]:  fentanyl, baclofen changed to morphine and baclofen on 07/2012  . Cerebral aneurysm repair  1999   Prior to Admission medications   Medication Sig Start Date End Date Taking? Authorizing Provider  Acetaminophen (TYLENOL) 167 MG/5ML LIQD Take 1,000 mg by mouth every 8 (eight) hours as needed (for pain).   Yes Historical Provider, MD  amLODipine (NORVASC) 5 MG tablet Take 5 mg by mouth daily.   Yes Historical Provider, MD  aspirin EC 81 MG tablet Take 81 mg by mouth daily.   Yes Historical Provider, MD  benzocaine (ANBESOL) 10 % mucosal gel Use as directed 1 application in the mouth or throat as needed for pain.   Yes Historical Provider, MD  bisacodyl (DULCOLAX) 10 MG suppository Place 10 mg rectally daily as needed for constipation.   Yes Historical Provider, MD  camphor-menthol Wynelle Fanny) lotion Apply 1 application topically as needed for itching.   Yes Historical Provider, MD  cholecalciferol (VITAMIN D) 1000 UNITS tablet Take 3,000 Units by mouth every morning.   Yes Historical Provider, MD  cloNIDine (CATAPRES - DOSED IN MG/24 HR) 0.1 mg/24hr patch Place 1 patch (0.1 mg total) onto the skin once a week. 08/09/12  Yes Jeanella Craze, NP  cloNIDine (CATAPRES) 0.1 MG tablet Take 0.1 mg by  mouth every 6 (six) hours as needed (for SBP greater than 160).   Yes Historical Provider, MD  divalproex (DEPAKOTE) 500 MG DR tablet Take 500 mg by mouth 2 (two) times daily.   Yes Historical Provider, MD  divalproex (DEPAKOTE) 500 MG DR tablet Take 500 mg by mouth 2 (two) times daily.   Yes Historical Provider, MD  DULoxetine (CYMBALTA) 60 MG capsule Take 60 mg by mouth daily.   Yes Historical Provider, MD  Eyelid Cleansers (OCUSOFT LID SCRUB) PADS Place 1 each into both eyes every morning.   Yes Historical Provider, MD   hydrOXYzine (ATARAX/VISTARIL) 25 MG tablet Take 12.5 mg by mouth daily as needed for itching or anxiety.   Yes Historical Provider, MD  ipratropium-albuterol (DUONEB) 0.5-2.5 (3) MG/3ML SOLN Take 3 mLs by nebulization every 6 (six) hours as needed (for shortness of breath/wheezing).   Yes Historical Provider, MD  labetalol (NORMODYNE) 200 MG tablet Take 200 mg by mouth 2 (two) times daily.   Yes Historical Provider, MD  levETIRAcetam (KEPPRA) 1000 MG tablet Take 2,000 mg by mouth 2 (two) times daily.   Yes Historical Provider, MD  levothyroxine (SYNTHROID, LEVOTHROID) 137 MCG tablet Take 0.5 tablets (68.5 mcg total) by mouth daily before breakfast. 08/09/12  Yes Jeanella Craze, NP  LORazepam (ATIVAN) 2 MG/ML injection Inject 1.5 mg into the vein as needed for seizure.   Yes Historical Provider, MD  losartan (COZAAR) 50 MG tablet Take 1 tablet (50 mg total) by mouth at bedtime. 07/31/12  Yes Elliot Cousin, MD  olopatadine (PATANOL) 0.1 % ophthalmic solution Place 1 drop into both eyes 2 (two) times daily as needed for allergies (every morning and as needed).    Yes Historical Provider, MD  omeprazole (PRILOSEC) 20 MG capsule Take 20 mg by mouth daily.   Yes Historical Provider, MD  oxyCODONE (OXY IR/ROXICODONE) 5 MG immediate release tablet Take 5 mg by mouth every 8 (eight) hours as needed for pain (for moderate to severe pain).   Yes Historical Provider, MD  polyethylene glycol (MIRALAX / GLYCOLAX) packet Take 17 g by mouth daily.   Yes Historical Provider, MD  selenium sulfide (SELSUN) 1 % LOTN Apply 1 application topically 3 (three) times a week.   Yes Historical Provider, MD  simvastatin (ZOCOR) 20 MG tablet Take 20 mg by mouth at bedtime.   Yes Historical Provider, MD  triamcinolone cream (KENALOG) 0.1 % Apply 1 application topically 2 (two) times daily as needed (to rash).   Yes Historical Provider, MD  zolpidem (AMBIEN) 5 MG tablet Take 2.5 mg by mouth at bedtime as needed for sleep.   Yes  Historical Provider, MD   Allergies  Allergen Reactions  . Codeine Other (See Comments)    unknown    FAMILY HISTORY:  History reviewed. No pertinent family history. SOCIAL HISTORY:  reports that she has never smoked. She has never used smokeless tobacco. She reports that she does not drink alcohol or use illicit drugs.  REVIEW OF SYSTEMS:  Per HPI.  Otherwise unable, pt confused.   VITAL SIGNS: Temp:  [96 F (35.6 C)-97.6 F (36.4 C)] 97.5 F (36.4 C) (09/13 0730) Pulse Rate:  [61-75] 73 (09/13 0730) Resp:  [6-15] 9 (09/13 0730) BP: (76-96)/(42-64) 88/53 mmHg (09/13 0730) SpO2:  [87 %-100 %] 95 % (09/13 0730) Weight:  [209 lb 3.5 oz (94.9 kg)] 209 lb 3.5 oz (94.9 kg) (09/12 1900) HEMODYNAMICS:   VENTILATOR SETTINGS:   INTAKE / OUTPUT: Intake/Output  09/12 0701 - 09/13 0700 09/13 0701 - 09/14 0700   I.V. (mL/kg) 200 (2.1)    Total Intake(mL/kg) 200 (2.1)    Urine (mL/kg/hr) 850    Total Output 850     Net -650            PHYSICAL EXAMINATION: General:  Chronically ill appearing female, NAD  Neuro:  Awake, stares off into space, attempts to answer questions but confused, repeats my words, R sided weakness HEENT:  Mm very dry, no JVD, no LA Cardiovascular:  s1s2 rrr, NSR 70's Lungs:  resps even non labored on [place], diminished bases, few scattered rhonchi  Abdomen:  Round, soft, non tender, hypoactive bs Musculoskeletal:  Warm and dry, 2+ BLE edema   LABS:  CBC  Recent Labs Lab 11/18/12 1439 11/19/12 0500  HGB 11.5* 11.7*  HCT 37.2 38.9  WBC 4.3 4.7  PLT 57* 46*    Coag's No results found for this basename: APTT, INR,  in the last 72 hours BMET  Recent Labs Lab 11/18/12 1439 11/19/12 0500  NA 150* 153*  K 3.3* 3.7  CL 112 117*  CO2 30 29  GLUCOSE 87 77  BUN 13 11  CREATININE 0.63 0.63  CALCIUM 8.4 7.7*   Electrolytes Recent Labs     11/18/12  1439  11/19/12  0500  CALCIUM  8.4  7.7*   Sepsis Markers No results found for this  basename: LACTICACIDVEN, PROCALCITON, O2SATVEN,  in the last 72 hours ABG No results found for this basename: PHART, PCO2ART, PO2ART,  in the last 72 hours Liver Enzymes No results found for this basename: AST, ALT, ALKPHOS, BILITOT, ALBUMIN,  in the last 72 hours Cardiac Enzymes   Recent Labs Lab 11/18/12 1439  TROPONINI <0.30   Glucose No results found for this basename: GLUCAP,  in the last 72 hours  Imaging Dg Chest Port 1 View  11/18/2012   CLINICAL DATA:  Chest pain, low heart rate, history of stroke  EXAM: PORTABLE CHEST - 1 VIEW  COMPARISON:  09/20/2012  FINDINGS: Study is limited by poor inspiration. Cardiomediastinal silhouette is stable. Mild basilar atelectasis. Mild elevation of the right hemidiaphragm again noted. No acute infiltrate or pulmonary edema. Stable left basilar scarring.  IMPRESSION: Limited study by poor inspiration. Mild basilar atelectasis. No acute infiltrate or pulmonary edema.   Electronically Signed   By: Natasha Mead   On: 11/18/2012 13:31    ASSESSMENT / PLAN:  PULMONARY Hx OSA on cpap  P:   cpap qhs and PRN  F/u CXR   CARDIOVASCULAR Septic shock Hypotension  Hx HTN  P:  Cont gentle volume  Place CVL for CVP  Hold home anti-HTN clonidine & labetalol F/u lactate, pct -septic shock protocol for CVp/ co-ox  RENAL Hypernatremia  Hypokalemia  P:   F/u chem  Cont 1/2 NS   GASTROINTESTINAL No acute issue P:   NPO for now   HEMATOLOGIC Thrombocytopenia - assumed r/t sepsis  P:  Hold heparin  SCD's for DVT proph  F/u cbc  D/c asa    INFECTIOUS Presumed UTI -- (Hx ESBL Ecoli UTI, previously intermed res to zosyn) P:   Change zosyn to meropenem 9/13 Add vanc given SNF resident and ?other potential source   ENDOCRINE Hypothyroid   - TSH=5.255 P:   Increase home synthroid - will order as IV for now (will increase home 68.5->100 and convert to IV dose)   NEUROLOGIC AMS - ?baseline.  Hx CVA Hx Seziure  disorder P:   Monitor   Avoid sedation  Cont home depakote, keppra - will change to IV for now   I have personally obtained a history, examined the patient, evaluated laboratory and imaging results, formulated the assessment and plan and placed orders. CRITICAL CARE: The patient is critically ill with multiple organ systems failure and requires high complexity decision making for assessment and support, frequent evaluation and titration of therapies, application of advanced monitoring technologies and extensive interpretation of multiple databases. Critical Care Time devoted to patient care services described in this note is 60  minutes.   Cyril Mourning MD. Tonny Bollman. [facility] Pager [phone] If no response call [phone]

## 2012-11-19 NOTE — Progress Notes (Signed)
ANTIBIOTIC CONSULT NOTE - INITIAL  Pharmacy Consult for Meropenem Indication: UTI with history of ESBL organisms  Allergies  Allergen Reactions  . Codeine Other (See Comments)    unknown    Patient Measurements: Height: 5' 1.81" (157 cm) Weight: 209 lb 3.5 oz (94.9 kg) (per nurse) IBW/kg (Calculated) : 49.67  Vital Signs: Temp: 97.5 F (36.4 C) (09/13 0730) Temp src: Axillary (09/13 0730) BP: 92/59 mmHg (09/13 0930) Pulse Rate: 76 (09/13 0930) Intake/Output from previous day: 09/12 0701 - 09/13 0700 In: 200 [I.V.:200] Out: 850 [Urine:850] Intake/Output from this shift: Total I/O In: 137.5 [I.V.:125; IV Piggyback:12.5] Out: -   Labs:  Recent Labs  11/18/12 1439 11/19/12 0500  WBC 4.3 4.7  HGB 11.5* 11.7*  PLT 57* 46*  CREATININE 0.63 0.63   Estimated Creatinine Clearance: 83 ml/min (by C-G formula based on Cr of 0.63). No results found for this basename: VANCOTROUGH, Leodis Binet, VANCORANDOM, GENTTROUGH, GENTPEAK, GENTRANDOM, TOBRATROUGH, TOBRAPEAK, TOBRARND, AMIKACINPEAK, AMIKACINTROU, AMIKACIN,  in the last 72 hours   Microbiology: Recent Results (from the past 720 hour(s))  MRSA PCR SCREENING     Status: None   Collection Time    11/18/12  6:47 PM      Result Value Range Status   MRSA by PCR NEGATIVE  NEGATIVE Final   Comment:            The GeneXpert MRSA Assay (FDA     approved for NASAL specimens     only), is one component of a     comprehensive MRSA colonization     surveillance program. It is not     intended to diagnose MRSA     infection nor to guide or     monitor treatment for     MRSA infections.    Medical History: Past Medical History  Diagnosis Date  . Hypertension   . Seizure disorder   . Metabolic encephalopathy 05/06/2010  . Respiratory failure 05/06/10  . UTI (lower urinary tract infection) 05/06/10  . 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 Sleep apnea 04/2010    on CPAP, "severe central sleep apnea"  . CVA (cerebral vascular  accident)     left sided hemiparesis  . Chronic pain   . Depression   . 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 Spasticity     chronic  . Diabetes mellitus without complication   . Thrombocytopenia, unspecified 07/28/2012  . Acute respiratory failure 07/27/2012  . Narcotic overdose 07/27/2012  . 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 Unspecified hypothyroidism   . Sleep apnea   . Seizures   . Arthritis     OSTEO LEFT LEG    Medications:  Anti-infectives   Start     Dose/Rate Route Frequency Ordered Stop   11/19/12 1700  cefTRIAXone (ROCEPHIN) 1 g in dextrose 5 % 50 mL IVPB  Status:  Discontinued     1 g 100 mL/hr over 30 Minutes Intravenous Every 24 hours 11/18/12 1856 11/19/12 0832   11/19/12 0845  piperacillin-tazobactam (ZOSYN) IVPB 3.375 g  Status:  Discontinued     3.375 g 12.5 mL/hr over 240 Minutes Intravenous 4 times per day 11/19/12 0832 11/19/12 1036   11/18/12 1615  cefTRIAXone (ROCEPHIN) 1 g in dextrose 5 % 50 mL IVPB     1 g 100 mL/hr over 30 Minutes Intravenous  Once 11/18/12 1611 11/18/12 1737     Assessment: 57 year old female admitted with  sepsis and possible UTI.  She was started on empiric therapy with Rocephin that was broadened to Zosyn today.  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 Coli.  She has a seizure history which impacts carbapenem choice for her.  Plan:  Discussed with Dirk Dress of [facility] - will change to meropenem which has a lower seizure potential than imipenem. Meropenem 500mg  IV q8h Monitor renal function  Follow-up ID and susceptibilities of Urine culture  Estella Husk, Pharm.D., BCPS, AAHIVP Clinical Pharmacist Phone: [phone] or [phone] 11/19/2012, 10:55 AM

## 2012-11-19 NOTE — Progress Notes (Signed)
Dr. Vassie Loll notified that patient is having intermittent apnea. She is maintaining her sa02's on 4 L N/C.( 94%)

## 2012-11-19 NOTE — Progress Notes (Signed)
[facility] PROGRESS NOTE  [name] YNW:[id number] DOB: 02-Apr-1955 DOA: 11/18/2012 PCP: Terald Sleeper, MD  Assessment/Plan: 57 y.o. female with PMH of HTN, HPL, hypothyroidism, h/o CVA with R sided paralysis, recurrent UTIs was at neurology appt at [facility] today, found to have progressively declining BP while in the office and brought to the ED; admitted with hypotension, possible UTI andsepsis   1. Hypotension, ? sepsis/UTI; pend UA; was admitted to step down due to hypotension;   -still labile BP/hypotensionm despite 6 liter of NS; not improved may need pressure support in ICU;  -d/w critical care Dr. Vassie Loll who kindly agreed to TF patient for pressure support and monitoring in ICU  -changed ceftriaxone(9/12) to zosyn (9/13); f/u culture/sens pend AM cortisol; empirically start steroid  -pend UA: had cath in ED with no significant urine in bladder, small amount of thick, purulent drainage from bladder cath; obtain renal US    2.HTN currently hypotensive; hold amlodipine, labetolol, losartan, resume low dose clonidine when able to prevent rebound HTN;  -close monitor   3. Hypo K, Hyper Na; likely dehydration; preserved renal function  -change NS to1/2 NS (9/13); cont IVF resuscitation; potassium; recheck in AM   4. Thrombocytopenia; chronic mild worse on this admission; ? Sepsis related  -no s/s of acute bleeding; monitor   5. Hypothyroidism, Last TSH 3.4 (09/2012)  -cont current regimen; rechecked tsh 5.2; will obtain free T4; increased levothyroxine to on 9/13  6. H/o CVA with hemiparesis, aphasia mild;  -cont ASA, statin,   DVT prophylaxis; hold heparin due to worsening thrombocytopenia; cont BL pneumatic compressions; recheck platelet in AM if improved resume heparin SQ    Code Status: full  Family Communication: not present at the bedside  (indicate person spoken with, relationship, and if by phone, the number) Disposition Plan: snf     Consultants:  CCM  Procedures:  CXR  -attempted A line per RRT unsuccessfully   Antibiotics: Ceftriaxone 9/12<<<  HPI/Subjective: Still lethargic, BP labile overnight despite IVF resuscitation   Objective: Filed Vitals:   11/19/12 0730  BP: 88/53  Pulse: 73  Temp: 97.5 F (36.4 C)  Resp: 9    Intake/Output Summary (Last 24 hours) at 11/19/12 0838 Last data filed at 11/19/12 0700  Gross per 24 hour  Intake    200 ml  Output    850 ml  Net   -650 ml   Filed Weights   11/18/12 1900  Weight: 94.9 kg (209 lb 3.5 oz)    Exam:   General:  Asrousable, confused   Cardiovascular: S1, S2 RRR   Respiratory: few crackles in LL   Abdomen: soft, obese, NT,   Musculoskeletal: mild LE edema    Data Reviewed: Basic Metabolic Panel:  Recent Labs Lab 11/18/12 1439 11/19/12 0500  NA 150* 153*  K 3.3* 3.7  CL 112 117*  CO2 30 29  GLUCOSE 87 77  BUN 13 11  CREATININE 0.63 0.63  CALCIUM 8.4 7.7*   Liver Function Tests: No results found for this basename: AST, ALT, ALKPHOS, BILITOT, PROT, ALBUMIN,  in the last 168 hours No results found for this basename: LIPASE, AMYLASE,  in the last 168 hours No results found for this basename: AMMONIA,  in the last 168 hours CBC:  Recent Labs Lab 11/18/12 1439 11/19/12 0500  WBC 4.3 4.7  NEUTROABS 1.2*  --   HGB 11.5* 11.7*  HCT 37.2 38.9  MCV 94.2 96.5  PLT 57* 46*   Cardiac Enzymes:  Recent Labs Lab 11/18/12 1439  TROPONINI <0.30   BNP (last 3 results)  Recent Labs  09/18/12 0340  PROBNP 180.8*   CBG: No results found for this basename: GLUCAP,  in the last 168 hours  Recent Results (from the past 240 hour(s))  MRSA PCR SCREENING     Status: None   Collection Time    11/18/12  6:47 PM      Result Value Range Status   MRSA by PCR NEGATIVE  NEGATIVE Final   Comment:            The GeneXpert MRSA Assay (FDA     approved for NASAL specimens     only), is one component of a     comprehensive  MRSA colonization     surveillance program. It is not     intended to diagnose MRSA     infection nor to guide or     monitor treatment for     MRSA infections.     Studies: Dg Chest Port 1 View  11/18/2012   CLINICAL DATA:  Chest pain, low heart rate, history of stroke  EXAM: PORTABLE CHEST - 1 VIEW  COMPARISON:  09/20/2012  FINDINGS: Study is limited by poor inspiration. Cardiomediastinal silhouette is stable. Mild basilar atelectasis. Mild elevation of the right hemidiaphragm again noted. No acute infiltrate or pulmonary edema. Stable left basilar scarring.  IMPRESSION: Limited study by poor inspiration. Mild basilar atelectasis. No acute infiltrate or pulmonary edema.   Electronically Signed   By: Natasha Mead   On: 11/18/2012 13:31    Scheduled Meds: . aspirin EC  81 mg Oral Daily  . cholecalciferol  3,000 Units Oral q morning - 10a  . divalproex  500 mg Oral BID  . DULoxetine  60 mg Oral Daily  . levETIRAcetam  2,000 mg Oral BID  . levothyroxine  68.5 mcg Oral QAC breakfast  . pantoprazole  40 mg Oral Daily  . piperacillin-tazobactam (ZOSYN)  IV  3.375 g Intravenous Q6H  . polyethylene glycol  17 g Oral Daily  . simvastatin  20 mg Oral QHS   Continuous Infusions: . 0.45 % NaCl with KCl 20 mEq / L      Active Problems:   UTI (lower urinary tract infection)   Sepsis   Morbid obesity   History of stroke   Chronic pain disorder   Thrombocytopenia, unspecified   Unspecified hypothyroidism   Seizure disorder    Time spent: > 35 minutes     Esperanza Sheets  [facility] Pager [phone]. If 7PM-7AM, please contact night-coverage at [email or web address], password [email or web address] 11/19/2012, 8:38 AM  LOS: 1 day

## 2012-11-19 NOTE — Progress Notes (Signed)
Event: Multiple notifications through-out the evening regarding pt's persistent hypotension. Pt will maintain MAP's of 60 and > briefly after boluses which now counts 6L IVF since arrival in ED. However she continues to drop her SBP into the 70's after a brief recover. Pt is asymptomatic. UOP adequate. Order placed for A-line. NP to bedside. Subjective: Pt is able to report that her only c/o is (R) arm pain at her IV site. Denies other c/os'. Objective: [name] is a 57 y.o. female from snf with PMH of HTN,  hypothyroidism, HPL, h/o CVA with R sided paralysis and recurrent UTIs. Pt  was at a scheduled neurology appt at [facility] today and was found to have progressively declining BP while in the office and brought to the ED. She was given 2L of NS in ED with mild improvement in BP.  Patient denied fever, chills, cough, chest pain, N/V/D. U/A in ED grossly positive. Pt admitted for hypotension believed to be d/t Urosepsis vs possible overdose of HTN meds. All antihypertensive meds were placed on hold. At bedside pt noted resting in NAD. She awakens easily and is very alert and tracks. She has slurred speech at baseline but is able to answer simple questions appropriately. Current BP 89/59, P-70, and has remained afebrile. Skin w/d, color normal. Cap refill WNL.  UOP has been adequate and urine remains very concentrated. BBS CTA.  Assessment/Plan: 1. Persistent hypotension: (w/o objective symptoms of hypoperfusion)  Felt d/t urosepsis vs overdose of BP meds. Notified that RT unable to place a-line after multiple attempts. Discussed pt w/ Dr Belinda Block w/ [facility] who has recommended another 1L NS IVF bolus as pt now appears to be holding her MAP at 60 or > for approx 1 hr. ELINK to monitor for possible need for tx to ICU and possible vasopressive therapy. Will continue to monitor closely in SDU.  Rita Chang, NP-C [facility] Pager [phone]

## 2012-11-19 NOTE — Progress Notes (Signed)
Pt continues to have low BP. MD notified with order to bolus 1 more liter of fluid. Pt mental status unchanged. Will continue to monitor.

## 2012-11-20 DIAGNOSIS — G4730 Sleep apnea, unspecified: Secondary | ICD-10-CM

## 2012-11-20 LAB — PROCALCITONIN: Procalcitonin: 0.35 ng/mL

## 2012-11-20 LAB — GLUCOSE, CAPILLARY: Glucose-Capillary: 87 mg/dL (ref 70–99)

## 2012-11-20 LAB — CBC
HCT: 37.4 % (ref 36.0–46.0)
Platelets: 39 10*3/uL — ABNORMAL LOW (ref 150–400)
RBC: 3.95 MIL/uL (ref 3.87–5.11)
RDW: 16.8 % — ABNORMAL HIGH (ref 11.5–15.5)
WBC: 4.2 10*3/uL (ref 4.0–10.5)

## 2012-11-20 LAB — MAGNESIUM: Magnesium: 1.5 mg/dL (ref 1.5–2.5)

## 2012-11-20 LAB — BASIC METABOLIC PANEL
CO2: 27 mEq/L (ref 19–32)
Chloride: 114 mEq/L — ABNORMAL HIGH (ref 96–112)
GFR calc Af Amer: >90 mL/min (ref >90)
Potassium: 2.9 mEq/L — ABNORMAL LOW (ref 3.5–5.1)

## 2012-11-20 MED ORDER — POTASSIUM CHLORIDE 10 MEQ/50ML IV SOLN
INTRAVENOUS | Status: AC
  Administered 2012-11-20: 10 meq
  Filled 2012-11-20: qty 50

## 2012-11-20 MED ORDER — SODIUM CHLORIDE 0.9 % IV SOLN
6.0000 g | Freq: Once | INTRAVENOUS | Status: AC
  Administered 2012-11-20: 6 g via INTRAVENOUS
  Filled 2012-11-20: qty 12

## 2012-11-20 MED ORDER — WHITE PETROLATUM GEL
Status: AC
  Administered 2012-11-20: 19:00:00
  Filled 2012-11-20: qty 5

## 2012-11-20 MED ORDER — SODIUM PHOSPHATE 3 MMOLE/ML IV SOLN
10.0000 mmol | Freq: Once | INTRAVENOUS | Status: AC
  Administered 2012-11-20: 10 mmol via INTRAVENOUS
  Filled 2012-11-20: qty 3.33

## 2012-11-20 MED ORDER — POTASSIUM CHLORIDE 10 MEQ/50ML IV SOLN
10.0000 meq | INTRAVENOUS | Status: AC
  Administered 2012-11-20 (×8): 10 meq via INTRAVENOUS
  Filled 2012-11-20 (×7): qty 50

## 2012-11-20 NOTE — Progress Notes (Signed)
SLP Cancellation Note  Patient Details Name: RAMANI RIVA MRN: [id number] DOB: 1956-01-11   Cancelled treatment:  ST received order for BSE.  Attempt x2 but not completed as patient unable to maintain LOA to participate fully.  Will f/u on 11/21/12. Moreen Fowler MS, CCC-SLP [phone] [facility] 11/20/2012, 5:57 PM

## 2012-11-20 NOTE — Progress Notes (Signed)
Pt taken off CPAP and placed on 2lpm [place]. Pt tolerating at this time. RT will continue to monitor.

## 2012-11-20 NOTE — Progress Notes (Signed)
UR Completed.  Sandra Brents Jane [phone] 11/20/2012  

## 2012-11-20 NOTE — Progress Notes (Signed)
PULMONARY  / CRITICAL CARE MEDICINE  Name: [name] MRN: [id number] DOB: June 01, 1955    ADMISSION DATE:  11/18/2012 CONSULTATION DATE:  11/19/12  REFERRING MD :  York Spaniel  PRIMARY SERVICE: [facility] -> PCCM   CHIEF COMPLAINT:  Hypotension, sepsis  BRIEF PATIENT DESCRIPTION: 57 yo female SN[facility] resident admitted 9/12 with ?UTI, sepsis.  Developed hypotension inspite of 4L fluids, septic shock 9/13 and PCCM consulted.  PMhx HTN, seizure disorder, OSA, CVA, recurrent UTI with resistant e.coli 09/2012  SIGNIFICANT EVENTS / STUDIES:    LINES / TUBES: [facility] 9/13 >>   CULTURES: (Hx ESBL Ecoli UTI) Urine 9/12>>> BCx2 9/12>>>   ANTIBIOTICS: Zosyn 9/12>>>9/13 Meropenem 9/13>>>  SUBJ  - denies cp, dyspnea Placed on CPAP overnight for central apneas  VITAL SIGNS: Temp:  [97.9 F (36.6 C)-98.7 F (37.1 C)] 98.4 F (36.9 C) (09/14 0752) Pulse Rate:  [69-82] 80 (09/14 0900) Resp:  [0-15] 6 (09/14 0900) BP: (59-136)/(45-92) 127/77 mmHg (09/14 0900) SpO2:  [93 %-100 %] 100 % (09/14 0900) Weight:  [96.3 kg (212 lb 4.9 oz)-105.3 kg (232 lb 2.3 oz)] 105.3 kg (232 lb 2.3 oz) (09/14 0800) HEMODYNAMICS: CVP:  [5 mmHg-8 mmHg] 8 mmHg VENTILATOR SETTINGS:   INTAKE / OUTPUT: Intake/Output     09/13 0701 - 09/14 0700 09/14 0701 - 09/15 0700   I.V. (mL/kg) 2524.4 (26.2) 200 (1.9)   IV Piggyback 666.5 184   Total Intake(mL/kg) 3190.9 (33.1) 384 (3.6)   Urine (mL/kg/hr) 980 (0.4) 125 (0.4)   Total Output 980 125   Net +2210.9 +259          PHYSICAL EXAMINATION: General:  Chronically ill appearing female, on CPAP Neuro:  Awake, answers questions but confused, follows commands R sided weakness HEENT:  Mm very dry, no JVD, no LA Cardiovascular:  s1s2 rrr, NSR 70's Lungs:  resps even non labored on [place], diminished bases, few scattered rhonchi  Abdomen:  Round, soft, non tender, hypoactive bs Musculoskeletal:  Warm and dry, 2+ BLE edema   LABS:  CBC  Recent Labs Lab 11/18/12 1439  11/19/12 0500 11/20/12 0345  HGB 11.5* 11.7* 11.4*  HCT 37.2 38.9 37.4  WBC 4.3 4.7 4.2  PLT 57* 46* 39*    Coag's No results found for this basename: APTT, INR,  in the last 72 hours BMET  Recent Labs Lab 11/18/12 1439 11/19/12 0500 11/20/12 0345  NA 150* 153* 147*  K 3.3* 3.7 2.9*  CL 112 117* 114*  CO2 30 29 27   GLUCOSE 87 77 108*  BUN 13 11 7   CREATININE 0.63 0.63 0.52  CALCIUM 8.4 7.7* 7.7*  MG  --   --  1.5  PHOS  --   --  2.3   Electrolytes Recent Labs     11/18/12  1439  11/19/12  0500  11/20/12  0345  CALCIUM  8.4  7.7*  7.7*  MG   --    --   1.5  PHOS   --    --   2.3   Sepsis Markers Recent Labs     11/19/12  0954  11/20/12  0345  PROCALCITON  0.34  0.35   ABG No results found for this basename: PHART, PCO2ART, PO2ART,  in the last 72 hours Liver Enzymes No results found for this basename: AST, ALT, ALKPHOS, BILITOT, ALBUMIN,  in the last 72 hours Cardiac Enzymes    Recent Labs Lab 11/18/12 1439  TROPONINI <0.30   Glucose Recent Labs  11/19/12  1114  11/19/12  1530  11/19/12  1934  11/20/12  0014  11/20/12  0349  11/20/12  0735  GLUCAP  80  79  81  87  89  77    Imaging US Renal Port  11/19/2012   CLINICAL DATA:  Question hydronephrosis.  EXAM: RENAL/URINARY TRACT ULTRASOUND COMPLETE  COMPARISON:  None.  FINDINGS: Right Kidney: 9.3 cm. 1.3 cm in the midpole. No hydronephrosis. No focal abnormality. Normal echotexture.  Left Kidney: 9.0 cm. Difficult to visualize, but no visible focal abnormality or hydronephrosis. Echotexture appears normal.  Bladder:  Decompressed with Foley catheter in place.  IMPRESSION: 13 mm nonobstructing right midpole stone.  No hydronephrosis.   Electronically Signed   By: Charlett Nose M.D.   On: 11/19/2012 16:58   Dg Chest Port 1 View  11/19/2012   *RADIOLOGY REPORT*  Clinical Data: Central line placement  PORTABLE CHEST - 1 VIEW  Comparison: 11/18/2012  Findings: Left internal jugular central line  placed with tip projecting centrally over the mediastinum likely within the left brachial cephalic vein based on position.  Poor inspiratory effect with overlying medical devices.  Right lung is grossly clear.  Left lower lobe consolidation present.  No pneumothorax.  IMPRESSION: Central line as described.  No pneumothorax.  Left lower lobe consolidation.   Original Report Authenticated By: Esperanza Heir, M.D.   Dg Chest Port 1 View  11/18/2012   CLINICAL DATA:  Chest pain, low heart rate, history of stroke  EXAM: PORTABLE CHEST - 1 VIEW  COMPARISON:  09/20/2012  FINDINGS: Study is limited by poor inspiration. Cardiomediastinal silhouette is stable. Mild basilar atelectasis. Mild elevation of the right hemidiaphragm again noted. No acute infiltrate or pulmonary edema. Stable left basilar scarring.  IMPRESSION: Limited study by poor inspiration. Mild basilar atelectasis. No acute infiltrate or pulmonary edema.   Electronically Signed   By: Natasha Mead   On: 11/18/2012 13:31    ASSESSMENT / PLAN:  PULMONARY Hx OSA on cpap -questionable benefit for central apneas P:   cpap qhs and PRN    CARDIOVASCULAR Septic shock -resolved Hx HTN  P:  Hold home anti-HTN clonidine & labetalol -resume if BP remains ok next 24h  RENAL Hypernatremia  Hypokalemia/ hypophos/hypomag  P:   replte Cont 1/2 NS   GASTROINTESTINAL No acute issue P:   Swallow eval & advance PO  HEMATOLOGIC Thrombocytopenia - assumed r/t sepsis  P:  Hold heparin  SCD's for DVT proph  D/c asa    INFECTIOUS Presumed UTI -- (Hx ESBL Ecoli UTI, previously intermed res to zosyn) Right midpole 1.3 cm stone P:   Change zosyn to meropenem 9/13 Add vanc given SNF resident and ?other potential source  Urology input  ENDOCRINE Hypothyroid   - TSH=5.255 P:   Increase home synthroid - will order as IV for now (will increase home 68.5->100 and convert to IV dose)   NEUROLOGIC AMS - ?baseline.  Hx CVA Hx Seziure  disorder P:   Monitor  Avoid sedation  Cont home depakote, keppra - will change to IV for now  D/w daughter at bedside, OK to transfer to SDU if no urology intervention planned  I have personally obtained a history, examined the patient, evaluated laboratory and imaging results, formulated the assessment and plan and placed orders. CRITICAL CARE: The patient is critically ill with multiple organ systems failure and requires high complexity decision making for assessment and support, frequent evaluation and titration of therapies, application of advanced  monitoring technologies and extensive interpretation of multiple databases. Critical Care Time devoted to patient care services described in this note is 32  minutes.   Cyril Mourning MD. Tonny Bollman.  Pulmonary & Critical care Pager [phone] If no response call [phone]

## 2012-11-20 NOTE — Progress Notes (Signed)
[facility] ADULT ICU REPLACEMENT PROTOCOL FOR AM LAB REPLACEMENT ONLY  The patient does apply for the [facility] Adult ICU Electrolyte Replacment Protocol based on the criteria listed below:   1. Is GFR >/= 40 ml/min? yes  Patient's GFR today is >90 2. Is urine output >/= 0.5 ml/kg/hr for the last 6 hours? yes Patient's UOP is 0.6 ml/kg/hr 3. Is BUN < 60 mg/dL? yes  Patient's BUN today is 7 4. Abnormal electrolyte(s):K+ 2.9  Mag 1.5  Phos2.3 5. Ordered repletion withprotocol 6. If a panic level lab has been reported, has the CCM MD in charge been notified? yes.   Physician:  Dr Donney Rankins, Einar Gip 11/20/2012 4:55 AM

## 2012-11-20 NOTE — Consult Note (Signed)
Consult: recurrent UTI, nephrolithiasis Requested by: Dr. Vassie Loll  History of Present Illness: Admit two days ago with hypotension felt to be related to sepsis and UTI. She was changed from Zosyn to Meropenem and a renal U/s obtained. U/S revealed 13 mm nonobstructing right midpole stone, No hydronephrosis of either kidney. I reviewed all images. No pelvic imaging.   She was cathed for UA but quantity was insufficient for UA or urine Cx.   She has a history of recurrent ESBL UTI over past few years with enterococcus, proteus and e coli - one or two per year.  Very somnolent but she seems to answer questions appropriately. She denies GU hx or pelvic surgery.    Past Medical History  Diagnosis Date  . Hypertension   . Seizure disorder   . Metabolic encephalopathy 05/06/2010  . Respiratory failure 05/06/10  . UTI (lower urinary tract infection) 05/06/10  . 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 Sleep apnea 04/2010    on CPAP, "severe central sleep apnea"  . 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 Chronic pain   . Depression   . 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 Spasticity     chronic  . Diabetes mellitus without complication   . Thrombocytopenia, unspecified 07/28/2012  . Acute respiratory failure 07/27/2012  . Narcotic overdose 07/27/2012  . 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 Unspecified hypothyroidism   . Sleep apnea   . Seizures   . Arthritis     OSTEO LEFT LEG   Past Surgical History  Procedure Laterality Date  . Intrathecal pump implantation  2010    [employer]:  fentanyl, baclofen changed to morphine and baclofen on 07/2012  . Cerebral aneurysm repair  1999    Home Medications:  Prescriptions prior to admission  Medication Sig Dispense Refill  . Acetaminophen (TYLENOL) 167 MG/5ML LIQD Take 1,000 mg by mouth every 8 (eight) hours as needed (for pain).      Marland Kitchen amLODipine (NORVASC) 5 MG tablet Take 5 mg by  mouth daily.      Marland Kitchen aspirin EC 81 MG tablet Take 81 mg by mouth daily.      . benzocaine (ANBESOL) 10 % mucosal gel Use as directed 1 application in the mouth or throat as needed for pain.      . bisacodyl (DULCOLAX) 10 MG suppository Place 10 mg rectally daily as needed for constipation.      . camphor-menthol (SARNA) lotion Apply 1 application topically as needed for itching.      . cholecalciferol (VITAMIN D) 1000 UNITS tablet Take 3,000 Units by mouth every morning.      . cloNIDine (CATAPRES - DOSED IN MG/24 HR) 0.1 mg/24hr patch Place 1 patch (0.1 mg total) onto the skin once a week.  4 patch  12  . cloNIDine (CATAPRES) 0.1 MG tablet Take 0.1 mg by mouth every 6 (six) hours as needed (for SBP greater than 160).      Marland Kitchen divalproex (DEPAKOTE) 500 MG DR tablet Take 500 mg by mouth 2 (two) times daily.      . divalproex (DEPAKOTE) 500 MG DR tablet Take 500 mg by mouth 2 (two) times daily.      . DULoxetine (CYMBALTA) 60 MG capsule Take 60 mg by mouth daily.      . Eyelid Cleansers (OCUSOFT LID SCRUB) PADS Place 1 each into both eyes every morning.      Marland Kitchen  hydrOXYzine (ATARAX/VISTARIL) 25 MG tablet Take 12.5 mg by mouth daily as needed for itching or anxiety.      Marland Kitchen ipratropium-albuterol (DUONEB) 0.5-2.5 (3) MG/3ML SOLN Take 3 mLs by nebulization every 6 (six) hours as needed (for shortness of breath/wheezing).      . labetalol (NORMODYNE) 200 MG tablet Take 200 mg by mouth 2 (two) times daily.      Marland Kitchen levETIRAcetam (KEPPRA) 1000 MG tablet Take 2,000 mg by mouth 2 (two) times daily.      Marland Kitchen levothyroxine (SYNTHROID, LEVOTHROID) 137 MCG tablet Take 0.5 tablets (68.5 mcg total) by mouth daily before breakfast.      . LORazepam (ATIVAN) 2 MG/ML injection Inject 1.5 mg into the vein as needed for seizure.      Marland Kitchen losartan (COZAAR) 50 MG tablet Take 1 tablet (50 mg total) by mouth at bedtime.      Marland Kitchen olopatadine (PATANOL) 0.1 % ophthalmic solution Place 1 drop into both eyes 2 (two) times daily as needed  for allergies (every morning and as needed).       Marland Kitchen omeprazole (PRILOSEC) 20 MG capsule Take 20 mg by mouth daily.      Marland Kitchen oxyCODONE (OXY IR/ROXICODONE) 5 MG immediate release tablet Take 5 mg by mouth every 8 (eight) hours as needed for pain (for moderate to severe pain).      . polyethylene glycol (MIRALAX / GLYCOLAX) packet Take 17 g by mouth daily.      Marland Kitchen selenium sulfide (SELSUN) 1 % LOTN Apply 1 application topically 3 (three) times a week.      . simvastatin (ZOCOR) 20 MG tablet Take 20 mg by mouth at bedtime.      . triamcinolone cream (KENALOG) 0.1 % Apply 1 application topically 2 (two) times daily as needed (to rash).      . zolpidem (AMBIEN) 5 MG tablet Take 2.5 mg by mouth at bedtime as needed for sleep.       Allergies:  Allergies  Allergen Reactions  . Codeine Other (See Comments)    unknown    History reviewed. No pertinent family history. Social History:  reports that she has never smoked. She has never used smokeless tobacco. She reports that she does not drink alcohol or use illicit drugs.  ROS: A complete review of systems was performed.  All systems are negative except for pertinent findings as noted. @ROS @   Physical Exam:  Vital signs in last 24 hours: Temp:  [98.3 F (36.8 C)-98.7 F (37.1 C)] 98.4 F (36.9 C) (09/14 0752) Pulse Rate:  [69-82] 80 (09/14 1500) Resp:  [0-16] 13 (09/14 1500) BP: (79-136)/(50-92) 94/65 mmHg (09/14 1500) SpO2:  [93 %-100 %] 99 % (09/14 1500) Weight:  [96.3 kg (212 lb 4.9 oz)-105.3 kg (232 lb 2.3 oz)] 105.3 kg (232 lb 2.3 oz) (09/14 0800) General:  No acute distress, very somnolent but arousable HEENT: Normocephalic, atraumatic Neck: No JVD or lymphadenopathy Cardiovascular: Regular rate and rhythm Lungs: Regular rate and effort Abdomen: Soft, nontender, nondistended, no abdominal masses Back: No CVA tenderness Extremities: No edema   Laboratory Data:  Results for orders placed during the hospital encounter of 11/18/12  (from the past 24 hour(s))  GLUCOSE, CAPILLARY     Status: None   Collection Time    11/19/12  7:34 PM      Result Value Range   Glucose-Capillary 81  70 - 99 mg/dL  GLUCOSE, CAPILLARY     Status: None   Collection Time  11/20/12 12:14 AM      Result Value Range   Glucose-Capillary 87  70 - 99 mg/dL  CBC     Status: Abnormal   Collection Time    11/20/12  3:45 AM      Result Value Range   WBC 4.2  4.0 - 10.5 K/uL   RBC 3.95  3.87 - 5.11 MIL/uL   Hemoglobin 11.4 (*) 12.0 - 15.0 g/dL   HCT 04.5  40.9 - 81.1 %   MCV 94.7  78.0 - 100.0 fL   MCH 28.9  26.0 - 34.0 pg   MCHC 30.5  30.0 - 36.0 g/dL   RDW 91.4 (*) 78.2 - 95.6 %   Platelets 39 (*) 150 - 400 K/uL  BASIC METABOLIC PANEL     Status: Abnormal   Collection Time    11/20/12  3:45 AM      Result Value Range   Sodium 147 (*) 135 - 145 mEq/L   Potassium 2.9 (*) 3.5 - 5.1 mEq/L   Chloride 114 (*) 96 - 112 mEq/L   CO2 27  19 - 32 mEq/L   Glucose, Bld 108 (*) 70 - 99 mg/dL   BUN 7  6 - 23 mg/dL   Creatinine, Ser 2.13  0.50 - 1.10 mg/dL   Calcium 7.7 (*) 8.4 - 10.5 mg/dL   GFR calc non Af Amer >90  >90 mL/min   GFR calc Af Amer >90  >90 mL/min  MAGNESIUM     Status: None   Collection Time    11/20/12  3:45 AM      Result Value Range   Magnesium 1.5  1.5 - 2.5 mg/dL  PHOSPHORUS     Status: None   Collection Time    11/20/12  3:45 AM      Result Value Range   Phosphorus 2.3  2.3 - 4.6 mg/dL  PROCALCITONIN     Status: None   Collection Time    11/20/12  3:45 AM      Result Value Range   Procalcitonin 0.35    GLUCOSE, CAPILLARY     Status: None   Collection Time    11/20/12  3:49 AM      Result Value Range   Glucose-Capillary 89  70 - 99 mg/dL  GLUCOSE, CAPILLARY     Status: None   Collection Time    11/20/12  7:35 AM      Result Value Range   Glucose-Capillary 77  70 - 99 mg/dL  GLUCOSE, CAPILLARY     Status: None   Collection Time    11/20/12 11:14 AM      Result Value Range   Glucose-Capillary 81  70 - 99  mg/dL  GLUCOSE, CAPILLARY     Status: None   Collection Time    11/20/12  3:29 PM      Result Value Range   Glucose-Capillary 74  70 - 99 mg/dL   Recent Results (from the past 240 hour(s))  CULTURE, BLOOD (ROUTINE X 2)     Status: None   Collection Time    11/18/12  2:38 PM      Result Value Range Status   Specimen Description BLOOD ARM LEFT   Final   Special Requests BOTTLES DRAWN AEROBIC ONLY 1CC   Final   Culture  Setup Time     Final   Value: 11/19/2012 02:47     Performed at [facility]  Final   Value:        BLOOD CULTURE RECEIVED NO GROWTH TO DATE CULTURE WILL BE HELD FOR 5 DAYS BEFORE ISSUING A FINAL NEGATIVE REPORT     Performed at [facility]   Report Status PENDING   Incomplete  CULTURE, BLOOD (ROUTINE X 2)     Status: None   Collection Time    11/18/12  2:45 PM      Result Value Range Status   Specimen Description BLOOD HAND RIGHT   Final   Special Requests BOTTLES DRAWN AEROBIC ONLY 3CC   Final   Culture  Setup Time     Final   Value: 11/19/2012 02:47     Performed at [facility]   Culture     Final   Value:        BLOOD CULTURE RECEIVED NO GROWTH TO DATE CULTURE WILL BE HELD FOR 5 DAYS BEFORE ISSUING A FINAL NEGATIVE REPORT     Performed at [facility]   Report Status PENDING   Incomplete  MRSA PCR SCREENING     Status: None   Collection Time    11/18/12  6:47 PM      Result Value Range Status   MRSA by PCR NEGATIVE  NEGATIVE Final   Comment:            The GeneXpert MRSA Assay (FDA     approved for NASAL specimens     only), is one component of a     comprehensive MRSA colonization     surveillance program. It is not     intended to diagnose MRSA     infection nor to guide or     monitor treatment for     MRSA infections.  MRSA PCR SCREENING     Status: None   Collection Time    11/19/12 10:33 AM      Result Value Range Status   MRSA by PCR NEGATIVE  NEGATIVE Final   Comment:            The  GeneXpert MRSA Assay (FDA     approved for NASAL specimens     only), is one component of a     comprehensive MRSA colonization     surveillance program. It is not     intended to diagnose MRSA     infection nor to guide or     monitor treatment for     MRSA infections.   Creatinine:  Recent Labs  11/18/12 1439 11/19/12 0500 11/20/12 0345  CREATININE 0.63 0.63 0.52    Impression/Assessment/Plan: -Nephrolithiasis - possibly related to recurrent UTI, but no urgent intervention needed. Will discuss management options with her in outpatient when she is feeling well.   -UTI - she would benefit from a post-void check in office (given neurologic history) and cystoscopy (rule out urethral diverticulum, fistula, etc.).  I left my contact info with patient and on chart.   Antony Haste 11/20/2012, 5:19 PM

## 2012-11-21 DIAGNOSIS — I10 Essential (primary) hypertension: Secondary | ICD-10-CM | POA: Diagnosis present

## 2012-11-21 LAB — GLUCOSE, CAPILLARY

## 2012-11-21 LAB — BASIC METABOLIC PANEL
BUN: 3 mg/dL — ABNORMAL LOW (ref 6–23)
CO2: 30 mEq/L (ref 19–32)
Chloride: 107 mEq/L (ref 96–112)
Glucose, Bld: 85 mg/dL (ref 70–99)
Potassium: 3.2 mEq/L — ABNORMAL LOW (ref 3.5–5.1)

## 2012-11-21 LAB — CBC
HCT: 37.1 % (ref 36.0–46.0)
Hemoglobin: 11.7 g/dL — ABNORMAL LOW (ref 12.0–15.0)
MCH: 28.9 pg (ref 26.0–34.0)
MCHC: 31.5 g/dL (ref 30.0–36.0)
MCV: 91.6 fL (ref 78.0–100.0)

## 2012-11-21 MED ORDER — LEVETIRACETAM 750 MG PO TABS
1500.0000 mg | ORAL_TABLET | Freq: Two times a day (BID) | ORAL | Status: DC
  Administered 2012-11-21 – 2012-11-23 (×4): 1500 mg via ORAL
  Filled 2012-11-21 (×6): qty 2

## 2012-11-21 MED ORDER — DIVALPROEX SODIUM 500 MG PO DR TAB
500.0000 mg | DELAYED_RELEASE_TABLET | Freq: Two times a day (BID) | ORAL | Status: DC
  Administered 2012-11-21 – 2012-11-23 (×4): 500 mg via ORAL
  Filled 2012-11-21 (×6): qty 1

## 2012-11-21 MED ORDER — POTASSIUM CHLORIDE 10 MEQ/50ML IV SOLN
10.0000 meq | INTRAVENOUS | Status: AC
  Administered 2012-11-21 (×4): 10 meq via INTRAVENOUS
  Filled 2012-11-21: qty 50

## 2012-11-21 MED ORDER — ENSURE COMPLETE PO LIQD
237.0000 mL | Freq: Two times a day (BID) | ORAL | Status: DC
  Administered 2012-11-22 – 2012-11-23 (×4): 237 mL via ORAL

## 2012-11-21 MED ORDER — LEVOTHYROXINE SODIUM 100 MCG PO TABS
100.0000 ug | ORAL_TABLET | Freq: Every day | ORAL | Status: DC
  Administered 2012-11-22 – 2012-11-23 (×2): 100 ug via ORAL
  Filled 2012-11-21 (×4): qty 1

## 2012-11-21 MED ORDER — SODIUM CHLORIDE 0.9 % IV SOLN
INTRAVENOUS | Status: DC
  Administered 2012-11-21: 20:00:00 via INTRAVENOUS

## 2012-11-21 MED ORDER — DIVALPROEX SODIUM 125 MG PO CPSP: 500.0000 mg | ORAL_CAPSULE | Freq: Two times a day (BID) | ORAL | Status: DC

## 2012-11-21 NOTE — Progress Notes (Signed)
Attempts made to gain PIV access. IV team with multiple attempts made, without success.  Pt has poor vasculature  access.   Left CVC left in place.  Dr Sung Amabile aware.  Will continue to monitor.

## 2012-11-21 NOTE — Progress Notes (Signed)
Pt had been on CPAP when I entered room. Pt resting comfortably with no distress.

## 2012-11-21 NOTE — Progress Notes (Signed)
Spoke with patient about CPAP. Patient is not ready to put it back on. Patient was instructed to notify RT when ready. RT will continue to monitor.

## 2012-11-21 NOTE — Evaluation (Signed)
Clinical/Bedside Swallow Evaluation Patient Details  Name: [name] MRN: [id number] Date of Birth: December 20, 1955  Today's Date: 11/21/2012 Time: 0821-0841 SLP Time Calculation (min): 20 min  Past Medical History:  Past Medical History  Diagnosis Date  . Hypertension   . Seizure disorder   . Metabolic encephalopathy 05/06/2010  . Respiratory failure 05/06/10  . UTI (lower urinary tract infection) 05/06/10  . 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 Sleep apnea 04/2010    on CPAP, "severe central sleep apnea"  . 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 Chronic pain   . Depression   . 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 Spasticity     chronic  . Diabetes mellitus without complication   . Thrombocytopenia, unspecified 07/28/2012  . Acute respiratory failure 07/27/2012  . Narcotic overdose 07/27/2012  . 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 Unspecified hypothyroidism   . Sleep apnea   . Seizures   . Arthritis     OSTEO LEFT LEG   Past Surgical History:  Past Surgical History  Procedure Laterality Date  . Intrathecal pump implantation  2010    [employer]:  fentanyl, baclofen changed to morphine and baclofen on 07/2012  . Cerebral aneurysm repair  1999   HPI:  57 y.o. female with PMH of HTN, hypothyroidism, HPL, seizure disorder DM, h/o CVA with R sided paralysis, recurrent UTIs was at neurology appt at [facility] today, found to have progressively declining BP while in the office and brought to the ED; She was given two liter of NS in ED with mild improvement in BP response; started on atx in ED for possible UTI/sepsis.  CXR 9/12 Limited study by poor inspiration. Mild basilar atelectasis. No acute infiltrate or pulmonary edema and repeat CXR revealed Central line as described. No pneumothorax. Left lower lobe consolidation.   Assessment / Plan / Recommendation Clinical Impression  Pt. arouses with mild stimuli.   Oral movements are tremorous however with minimal implications for mastication.  Suspect intermittent delayed swallow initiation and minimally decreased laryngeal elevation.  No s/s aspiration exhibited.  Pt.'s daughter stated CVA was in 2008.  Given CXR is without infection and admission was for UTI, recommend she initiate a Dys 3 diet texture and thin liquids, straws ok and pills with liquid.  ST will follow up briefly for safety and ability to upgrade.     Aspiration Risk  Mild    Diet Recommendation Dysphagia 3 (Mechanical Soft);Thin liquid   Liquid Administration via: Cup;Straw Medication Administration: Whole meds with liquid Supervision: Full supervision/cueing for compensatory strategies;Patient able to self feed Compensations: Small sips/bites;Slow rate Postural Changes and/or Swallow Maneuvers: Seated upright 90 degrees    Other  Recommendations Oral Care Recommendations: Oral care BID   Follow Up Recommendations  None    Frequency and Duration min 1 x/week  2 weeks   Pertinent Vitals/Pain none    SLP Swallow Goals Patient will utilize recommended strategies during swallow to increase swallowing safety with: Supervision/safety   Swallow Study         Oral/Motor/Sensory Function Overall Oral Motor/Sensory Function: Impaired Labial ROM: Reduced right;Reduced left (tremorous) Labial Symmetry: Within Functional Limits Labial Strength: Reduced Labial Sensation: Within Functional Limits Lingual ROM: Reduced left;Reduced right (tremors) Lingual Symmetry: Within Functional Limits Lingual Strength: Reduced Facial ROM:  (tremors) Mandible: Within Functional Limits   Ice Chips Ice chips: Impaired Presentation: Spoon Oral Phase Functional Implications: Prolonged oral  transit Pharyngeal Phase Impairments: Suspected delayed Swallow   Thin Liquid Thin Liquid: Impaired Presentation: Cup;Spoon;Straw Oral Phase Impairments: Reduced labial seal Pharyngeal  Phase Impairments:  Suspected delayed Swallow;Decreased hyoid-laryngeal movement (audible swallow x 1)    Nectar Thick Nectar Thick Liquid: Not tested   Honey Thick Honey Thick Liquid: Not tested   Puree Puree: Within functional limits   Solid       Solid: Impaired Oral Phase Impairments: Reduced lingual movement/coordination Oral Phase Functional Implications:  (min delayed transit)       Royce Macadamia M.Ed [employer] [phone]  11/21/2012

## 2012-11-21 NOTE — Progress Notes (Signed)
Report given to Crystal RN 

## 2012-11-21 NOTE — Progress Notes (Signed)
Placed pt. On cpap. Pt. Is tolerating well at this time.  

## 2012-11-21 NOTE — Progress Notes (Signed)
[facility] ADULT ICU REPLACEMENT PROTOCOL FOR AM LAB REPLACEMENT ONLY  The patient does apply for the [facility] Adult ICU Electrolyte Replacment Protocol based on the criteria listed below:   1. Is GFR >/= 40 ml/min? yes  Patient's GFR today is >90 2. Is urine output >/= 0.5 ml/kg/hr for the last 6 hours? yes Patient's UOP is 0.6 ml/kg/hr 3. Is BUN < 60 mg/dL? yes  Patient's BUN today is 3 4. Abnormal electrolyte(s): K+ 3.2 5. Ordered repletion with: protocol 6. If a panic level lab has been reported, has the CCM MD in charge been notified? yes.   Physician:  Dr Bea Laura Deterding  Cathlean Cower Hilliard 11/21/2012 5:50 AM

## 2012-11-21 NOTE — Progress Notes (Signed)
Asked to second assess patient for PIV access - 2 IV team RN's assessed with Prevue [place] for placement - one vein R arm identified and attempt to access using Prevue but unable to access vein. No other veins identified to attempt. Left arm flaccid so no attempts to assess arm.

## 2012-11-21 NOTE — Progress Notes (Signed)
[facility] Progress Note [name] TEAM 1 - Stepdown ICU Team   [name]:[id number] DOB: 1955-04-03 DOA: 11/18/2012 PCP: Terald Sleeper, MD  Brief narrative: 57 y.o. female with PMH of HTN, , hypothyroidism, HLD, h/o CVA with R sided paralysis, recurrent UTIs was at neurology appt at [facility] and found to have progressively declining BP while in the office and brought to the ED; She was given two liter of NS in ED with mild improvement in BP response; Patient denied fever, chills, cough, chest pain, nausea vomiting diarrhea;  - she had cath in ED with no significant urine in bladder, small amount of thick, purulent drainage from bladder cath; started on abx in ED for possible UTI/sepsis. Developed hypotension despite 4L fluids, septic shock 9/13 and PCCM consulted and assumed care of the pt..    Assessment/Plan:    Sepsis - resolved -due to urinary source -now normotensive    Thrombocytopenia, unspecified -has chronic thrombocytopenia in the 120-130,000 range but now down in the 30,000 range -suspect due to gram negative infection    UTI (lower urinary tract infection)/history of ESBL -no urine cx done this admission for unclear reasons - appears cx was ordered but never accomplished   13 mm nonobstructing right mid pole stone -Nephrolithiasis is possibly related to recurrent UTI, but no urgent intervention needed. Urology will discuss management options with her in outpatient when she is feeling well.  -UTI - she would benefit from a post-void check in office (given neurologic history) and cystoscopy (rule out urethral diverticulum, fistula, etc.).     Central sleep apnea/Morbid obesity -will benefit from outpt f/u and consideration for CPAP v/s BIPAP     Hypothyroidism -Synthroid    Seizure disorder -cont Depakote and Keppra    HTN (hypertension) -BP variable and soft at times so cont to hold home Norvasc      History of stroke/Hemiparesis  affecting left side as late effect of cerebrovascular accident -SLP following-cont IVF with dextrose-unable to place PIV so continue CL for now -resides at SNF    Chronic pain disorder   DVT prophylaxis: SCDs Code Status: Full Family Communication: Patient's daughter at bedside Disposition Plan/Expected LOS: Transfer to floor Isolation:Contact isolation for history of ESBL UTI  Consultants: Urology PCCM  Procedures: None  Cultures: Urine culture from 11/18/2012 was canceled  Preliminary blood cultures no growth to date  Antibiotics: Zosyn 9/12>>>9/13  Meropenem 9/13>>>  HPI/Subjective: General awake and requesting food stating she is hungry. No chest pain or shortness of breath. No abdom pain.  Objective: Blood pressure 119/74, pulse 77, temperature 98.7 F (37.1 C), temperature source Oral, resp. rate 17, height 5' 1.81" (1.57 m), weight 105.3 kg (232 lb 2.3 oz), SpO2 98.00%.  Intake/Output Summary (Last 24 hours) at 11/21/12 1523 Last data filed at 11/21/12 1200  Gross per 24 hour  Intake   1810 ml  Output   2970 ml  Net  -1160 ml   Exam: General: No acute respiratory distress Lungs: Clear to auscultation bilaterally without wheezes or crackles, RA Cardiovascular: Regular rate and rhythm without murmur gallop or rub normal S1 and S2, no peripheral edema or JVD Abdomen: Nontender, nondistended, soft, bowel sounds positive, no rebound, no ascites, no appreciable mass Musculoskeletal: No significant cyanosis, clubbing of bilateral lower extremities  Scheduled Meds:  Scheduled Meds: . cholecalciferol  3,000 Units Oral q morning - 10a  . feeding supplement  237 mL Oral BID BM  . levETIRAcetam  1,500 mg Intravenous Q12H  .  levothyroxine  50 mcg Intravenous Daily  . meropenem (MERREM) IV  500 mg Intravenous Q8H  . valproate sodium  500 mg Intravenous Q12H   Data Reviewed: Basic Metabolic Panel:  Recent Labs Lab 11/18/12 1439 11/19/12 0500 11/20/12 0345  11/21/12 0445  NA 150* 153* 147* 145  K 3.3* 3.7 2.9* 3.2*  CL 112 117* 114* 107  CO2 30 29 27 30   GLUCOSE 87 77 108* 85  BUN 13 11 7  3*  CREATININE 0.63 0.63 0.52 0.41*  CALCIUM 8.4 7.7* 7.7* 8.1*  MG  --   --  1.5  --   PHOS  --   --  2.3  --    CBC:  Recent Labs Lab 11/18/12 1439 11/19/12 0500 11/20/12 0345 11/21/12 0445  WBC 4.3 4.7 4.2 4.2  NEUTROABS 1.2*  --   --   --   HGB 11.5* 11.7* 11.4* 11.7*  HCT 37.2 38.9 37.4 37.1  MCV 94.2 96.5 94.7 91.6  PLT 57* 46* 39* 38*   Cardiac Enzymes:  Recent Labs Lab 11/18/12 1439  TROPONINI <0.30   BNP (last 3 results)  Recent Labs  09/18/12 0340  PROBNP 180.8*   CBG:  Recent Labs Lab 11/20/12 2006 11/20/12 2352 11/21/12 0328 11/21/12 0819 11/21/12 1138  GLUCAP 72 80 87 72 82    Recent Results (from the past 240 hour(s))  CULTURE, BLOOD (ROUTINE X 2)     Status: None   Collection Time    11/18/12  2:38 PM      Result Value Range Status   Specimen Description BLOOD ARM LEFT   Final   Special Requests BOTTLES DRAWN AEROBIC ONLY 1CC   Final   Culture  Setup Time     Final   Value: 11/19/2012 02:47     Performed at [facility]   Culture     Final   Value:        BLOOD CULTURE RECEIVED NO GROWTH TO DATE CULTURE WILL BE HELD FOR 5 DAYS BEFORE ISSUING A FINAL NEGATIVE REPORT     Performed at [facility]   Report Status PENDING   Incomplete  CULTURE, BLOOD (ROUTINE X 2)     Status: None   Collection Time    11/18/12  2:45 PM      Result Value Range Status   Specimen Description BLOOD HAND RIGHT   Final   Special Requests BOTTLES DRAWN AEROBIC ONLY 3CC   Final   Culture  Setup Time     Final   Value: 11/19/2012 02:47     Performed at [facility]   Culture     Final   Value:        BLOOD CULTURE RECEIVED NO GROWTH TO DATE CULTURE WILL BE HELD FOR 5 DAYS BEFORE ISSUING A FINAL NEGATIVE REPORT     Performed at [facility]   Report Status PENDING   Incomplete  MRSA PCR  SCREENING     Status: None   Collection Time    11/18/12  6:47 PM      Result Value Range Status   MRSA by PCR NEGATIVE  NEGATIVE Final   Comment:            The GeneXpert MRSA Assay (FDA     approved for NASAL specimens     only), is one component of a     comprehensive MRSA colonization     surveillance program. It is not  intended to diagnose MRSA     infection nor to guide or     monitor treatment for     MRSA infections.  MRSA PCR SCREENING     Status: None   Collection Time    11/19/12 10:33 AM      Result Value Range Status   MRSA by PCR NEGATIVE  NEGATIVE Final   Comment:            The GeneXpert MRSA Assay (FDA     approved for NASAL specimens     only), is one component of a     comprehensive MRSA colonization     surveillance program. It is not     intended to diagnose MRSA     infection nor to guide or     monitor treatment for     MRSA infections.     Studies:  Recent x-ray studies have been reviewed in detail by the Attending Physician    Junious Silk, ANP [facility] Office  [phone] Pager [phone]  **If unable to reach the above provider after paging please contact the Flow Manager @ [phone]  On-Call/Text Page:      [email or web address]      password [email or web address]  If 7PM-7AM, please contact night-coverage [email or web address] Password [email or web address] 11/21/2012, 3:23 PM   LOS: 3 days   I have personally examined this patient and reviewed the entire database. I have reviewed the above note, made any necessary editorial changes, and agree with its content.  Lonia Blood, MD [facility]

## 2012-11-21 NOTE — Progress Notes (Signed)
INITIAL NUTRITION ASSESSMENT  DOCUMENTATION CODES Per approved criteria  -Morbid Obesity   INTERVENTION:  Ensure Complete po BID, each supplement provides 350 kcal and 13 grams of protein.   NUTRITION DIAGNOSIS: Inadequate oral intake related to decreased alertness as evidenced by poor intake of meals.   Goal: Intake to meet >90% of estimated nutrition needs.  Monitor:  PO intake, labs, weight trend.  Reason for Assessment: Low [place]  57 y.o. female  Admitting Dx: Hypotension, Sepsis, UTI  ASSESSMENT: Patient provided minimal nutrition history during RD visit. She reports that she has lost weight recently, but unable to quantify and that she receives help with her meals at the nursing home where she lives. She did not drink any PO supplements PTA. Diet just advanced to Dysphagia 3 with thin liquids this AM, did not eat breakfast because no staff was available to feed her and she was not alert enough to feed herself. Patient with skin tears to sacrum. Needs adequate intake to prevent further skin breakdown.  Nutrition Focused Physical Exam:  Subcutaneous Fat:  Orbital Region: WNL Upper Arm Region: WNL Thoracic and Lumbar Region: NA  Muscle:  Temple Region: WNL Clavicle Bone Region: WNL Clavicle and Acromion Bone Region: WNL Scapular Bone Region: NA Dorsal Hand: WNL Patellar Region: WNL Anterior Thigh Region: WNL Posterior Calf Region: WNL  Edema: 2+ BLE edema  Height: Ht Readings from Last 1 Encounters:  11/18/12 5' 1.81" (1.57 m)    Weight: Wt Readings from Last 1 Encounters:  11/20/12 232 lb 2.3 oz (105.3 kg)    Ideal Body Weight: 50 kg  % Ideal Body Weight: 211%  Wt Readings from Last 10 Encounters:  11/20/12 232 lb 2.3 oz (105.3 kg)  09/23/12 226 lb 10.1 oz (102.8 kg)  08/09/12 220 lb 7.4 oz (100 kg)  07/29/12 242 lb 1 oz (109.8 kg)  03/05/11 235 lb (106.595 kg)    Usual Body Weight: 220-242 lb  % Usual Body Weight: 100%  BMI:  Body mass  index is 42.72 kg/(m^2). class 3, extreme/morbid obesity  Estimated Nutritional Needs: Kcal: 1700-1800 Protein: 105-120 gm Fluid: 1.7-2 L  Skin: skin tear on sacrum  Diet Order: Dysphagia 3 with thin liquids  EDUCATION NEEDS: -Education not appropriate at this time   Intake/Output Summary (Last 24 hours) at 11/21/12 1153 Last data filed at 11/21/12 0800  Gross per 24 hour  Intake   1715 ml  Output   2570 ml  Net   -855 ml    Last BM: 9/14   Labs:   Recent Labs Lab 11/19/12 0500 11/20/12 0345 11/21/12 0445  NA 153* 147* 145  K 3.7 2.9* 3.2*  CL 117* 114* 107  CO2 29 27 30   BUN 11 7 3*  CREATININE 0.63 0.52 0.41*  CALCIUM 7.7* 7.7* 8.1*  MG  --  1.5  --   PHOS  --  2.3  --   GLUCOSE 77 108* 85    CBG (last 3)   Recent Labs  11/20/12 2352 11/21/12 0328 11/21/12 0819  GLUCAP 80 87 72    Scheduled Meds: . cholecalciferol  3,000 Units Oral q morning - 10a  . levETIRAcetam  1,500 mg Intravenous Q12H  . levothyroxine  50 mcg Intravenous Daily  . meropenem (MERREM) IV  500 mg Intravenous Q8H  . potassium chloride  10 mEq Intravenous Q1 Hr x 4  . valproate sodium  500 mg Intravenous Q12H    Continuous Infusions: . dextrose 5 % and 0.45%  NaCl 50 mL/hr at 11/21/12 0733  . dextrose 5 % and 0.45% NaCl 10 mL/hr at 11/19/12 2000    Past Medical History  Diagnosis Date  . Hypertension   . Seizure disorder   . Metabolic encephalopathy 05/06/2010  . Respiratory failure 05/06/10  . UTI (lower urinary tract infection) 05/06/10  . 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 Sleep apnea 04/2010    on CPAP, "severe central sleep apnea"  . 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 Chronic pain   . Depression   . 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 Spasticity     chronic  . Diabetes mellitus without complication   . Thrombocytopenia, unspecified 07/28/2012  . Acute respiratory failure 07/27/2012  . Narcotic overdose 07/27/2012  . Hemiparesis  affecting left side as late effect of cerebrovascular accident   . Unspecified hypothyroidism   . Sleep apnea   . Seizures   . Arthritis     OSTEO LEFT LEG    Past Surgical History  Procedure Laterality Date  . Intrathecal pump implantation  2010    [employer]:  fentanyl, baclofen changed to morphine and baclofen on 07/2012  . Cerebral aneurysm repair  1999    Joaquin Courts, RD, LDN, CNSC Pager [phone] After Hours Pager [phone]

## 2012-11-22 ENCOUNTER — Inpatient Hospital Stay (HOSPITAL_COMMUNITY): Payer: Medicare Other

## 2012-11-22 DIAGNOSIS — G894 Chronic pain syndrome: Secondary | ICD-10-CM

## 2012-11-22 LAB — CBC
Hemoglobin: 11.6 g/dL — ABNORMAL LOW (ref 12.0–15.0)
MCH: 28.7 pg (ref 26.0–34.0)
MCV: 90.3 fL (ref 78.0–100.0)
RBC: 4.04 MIL/uL (ref 3.87–5.11)

## 2012-11-22 LAB — BASIC METABOLIC PANEL
BUN: <3 mg/dL — ABNORMAL LOW (ref 6–23)
CO2: 33 mEq/L — ABNORMAL HIGH (ref 19–32)
Calcium: 8.6 mg/dL (ref 8.4–10.5)
Glucose, Bld: 79 mg/dL (ref 70–99)
Sodium: 141 mEq/L (ref 135–145)

## 2012-11-22 MED ORDER — AMLODIPINE BESYLATE 5 MG PO TABS: 5.0000 mg | ORAL_TABLET | Freq: Every day | ORAL | Status: DC

## 2012-11-22 MED ORDER — SODIUM CHLORIDE 0.9 % IJ SOLN
10.0000 mL | INTRAMUSCULAR | Status: DC | PRN
  Administered 2012-11-23 (×2): 10 mL

## 2012-11-22 MED ORDER — FUROSEMIDE 10 MG/ML IJ SOLN
40.0000 mg | Freq: Every day | INTRAMUSCULAR | Status: DC
  Administered 2012-11-22 – 2012-11-23 (×2): 40 mg via INTRAVENOUS
  Filled 2012-11-22 (×2): qty 4

## 2012-11-22 NOTE — Clinical Social Work Psychosocial (Signed)
Clinical Social Work Department BRIEF PSYCHOSOCIAL ASSESSMENT 11/22/2012  Patient:  [name], [name]     Account Number:  [id number]     Admit date:  11/18/2012  Clinical Social Worker:  Read Drivers  Date/Time:  11/22/2012 02:51 PM  Referred by:  Physician  Date Referred:  11/22/2012 Referred for  SNF Placement   Other Referral:   none   Interview type:  Patient Other interview type:   none    PSYCHOSOCIAL DATA Living Status:  FACILITY Admitted from facility:  [facility] Level of care:  Skilled Nursing Facility Primary support name:  Peyton Najjar Primary support relationship to patient:  SPOUSE Degree of support available:   adequate    CURRENT CONCERNS Current Concerns  Post-Acute Placement   Other Concerns:   none    SOCIAL WORK ASSESSMENT / PLAN CSW assessed pt at bedside.  Pt was alert and oriented. CSW introduced self and CSW role.  Transport was at pt bedside to transport pt to receive central line placement. Pt reports being from [facility] and has been there for aproximately 1 year.  Pt reports she will return to SNF once d/c.  CSW confirmed this with [facility] per [place], pt able to return.   Assessment/plan status:  Psychosocial Support/Ongoing Assessment of Needs Other assessment/ plan:   none   Information/referral to community resources:   SNF    PATIENT'S/FAMILY'S RESPONSE TO PLAN OF CARE: Pt was appreciative of CSW assistance and support.       Vickii Penna, LCSWA [phone]  Clinical Social Work

## 2012-11-22 NOTE — Progress Notes (Signed)
Speech Language Pathology Dysphagia Treatment Patient Details Name: [name] MRN: [id number] DOB: 05-Jun-1955 Today's Date: 11/22/2012 Time: 9629-5284 SLP Time Calculation (min): 16 min  Assessment / Plan / Recommendation Clinical Impression  Pt seen for f/u to assess diet tolerance with PO trials of dys. 3 textures and thin liquids. Immediate throat clear noted x1 after initial straw sip of water, however was not observed again throughout the remainder of PO intake. Tremorous oral movements appear to minimally impact oral phase, which is mildly prolonged. Pt and daughter both report that her oral phase is prolonged at baseline, and that she can consume any solid POs. She remains afebrile. Given the above, recommend to advance diet to regular textures and thin liquids. SLP to f/u briefly following advancement for diet tolerance and pt/family education.    Diet Recommendation  Initiate / Change Diet: Regular;Thin liquid    SLP Plan Continue with current plan of care   Pertinent Vitals/Pain N/A   Swallowing Goals  SLP Swallowing Goals Patient will utilize recommended strategies during swallow to increase swallowing safety with: Supervision/safety Swallow Study Goal #2 - Progress: Progressing toward goal  General Temperature Spikes Noted: No Respiratory Status: Supplemental O2 delivered via (comment) ([place] @3L ) Behavior/Cognition: Alert;Cooperative;Pleasant mood Oral Cavity - Dentition: Missing dentition Patient Positioning: Upright in bed  Oral Cavity - Oral Hygiene Does patient have any of the following "at risk" factors?: Oxygen therapy - cannula, mask, simple oxygen devices Brush patient's teeth BID with toothbrush (using toothpaste with fluoride): Yes Patient is AT RISK - Oral Care Protocol followed (see row info): Yes   Dysphagia Treatment Treatment focused on: Skilled observation of diet tolerance;Patient/family/caregiver education;Utilization of compensatory  strategies Family/Caregiver Educated: daughter Treatment Methods/Modalities: Skilled observation Patient observed directly with PO's: Yes Type of PO's observed: Dysphagia 3 (soft);Thin liquids Feeding: Needs assist Liquids provided via: Straw Oral Phase Signs & Symptoms: Prolonged oral phase;Other (comment) (pt's daughter states this is her baseline) Pharyngeal Phase Signs & Symptoms: Suspected delayed swallow initiation;Audible swallow;Immediate throat clear (throat clear x1 after initial sip)   GO     Maxcine Ham 11/22/2012, 9:36 AM   Maxcine Ham, M.A. CCC-SLP [phone]

## 2012-11-22 NOTE — Clinical Social Work Note (Signed)
CSW attempted to reach pt husband and pt daughter.  Neither answered.  CSW confirmed that pt was from [facility].  CSW will re-attempt assessment.  Vickii Penna, LCSWA [phone]  Clinical Social Work

## 2012-11-22 NOTE — Procedures (Signed)
Placement of right arm PICC for antibiotics.  Left jugular central line not working.  Tip in lower SVC and ready to use.

## 2012-11-22 NOTE — Progress Notes (Signed)
[facility] PROGRESS NOTE  TRINDA HARLACHER ZOX:[id number] DOB: [birth year] DOA: 11/18/2012 PCP: Terald Sleeper, MD  Assessment/Plan: Active Problems:   Morbid obesity   History of stroke   Chronic pain disorder   Thrombocytopenia, unspecified   Central sleep apnea   Hemiparesis affecting left side as late effect of cerebrovascular accident   Hypothyroidism   Seizure disorder   UTI (lower urinary tract infection)   Sepsis   HTN (hypertension)    1. Sepsis/UTI (lower urinary tract infection)/history of ESBL E. Coli: Patient presented with hypotension, refractory to initial aggressive ivi fluid resuscitation, and bladder catheterization revealed purulent urine. She was initially place on iv Zosyn, but given previous bacteriologic data, and possibility of ESBL E.Coli, she was switched to iv Meropenem, and managed under the auspices of PCCM, per sepsis protocol. Clinical response was satisfactory thereafter, and as of 11/20/12, sepsis physiology had resolved, patient was hemodynamically stable, and she was transferred back to medical service, effective 11/21/12. Blood cultures have remained negative so far. Urine culture, was ordered, but unfortunately, not done. Planning for a total of 10 days Meropenem therapy, to be concluded on 11/28/12.  2. Thrombocytopenia, unspecified: Patient has known chronic thrombocytopenia, with baseline platelet count in the 120K-130K range. Platelet count dropped during hospitalization, to reach a low of 38K on 11/21/12. Suspect due to gram negative infection. Appears to have plateaued today. Following CBC.   3. Urolithiasis. Renal U/S revealed a 13 mm nonobstructing right mid pole stone. This is a likely predisposition to patient's recurrent UTIs. Dr Mena Goes provided urology consultation, and has opined that no urgent intervention is indicated at this time. He plans to discuss management options with patient in outpatient environment, when she is feeling well,  and feels she will benefit from a post-void check in office (given neurologic history) and cystoscopy (rule out urethral diverticulum, fistula, etc.).  4. Central sleep apnea/Morbid obesity: Patient appears to have OSA, clinically. She will benefit from outpt evaluation/PSG and consideration for CPAP v/s BIPAP.   5. Hypothyroidism: Continued on thyroxine replacement therapy.   6. Seizure disorder: Asymptomatic on Depakote and Keppra  7. HTN (hypertension): BP is somewhat labile, and sometimes borderline, so Norvasc was initially placed on hold. BP appears reasonable today.  8. History of stroke/Hemiparesis affecting left side as late effect of cerebrovascular accident: Stable. No new focal deficits. Evaluated by SLP, initially placed on D3/Thin, but upgraded on 11/22/12, to Regular diet/Thin.  9. Chronic pain disorder: Not problematic.  10: Anasarca: Patient appeared anasarcic on physical examination today, no doubt secondary to enthusiastic iv fluid resuscitation. Have commenced iv Lasix today. Will need to follow renal indices. IV fluids have been discontinued.      Code Status: Full Code.  Family Communication:  Disposition Plan: To be determined.   Brief narrative: 57 y.o. female with PMH of HTN, hypothyroidism, HLD, h/o CVA with left hemiparesis, recurrent UTIs was at neurology appt at [facility] and found to have progressively declining BP while in the office and brought to the ED. She was given two liter of NS in ED with mild improvement in BP response. Patient denied fever, chills, cough, chest pain, nausea vomiting diarrhea. She had cath in ED with no significant urine in bladder, small amount of thick, purulent drainage from bladder cath; started on abx in ED for possible UTI/sepsis. Developed hypotension despite 4L fluids, septic shock 9/13 and PCCM consulted and assumed care of the patient. Transferred to [facility] service on 11/21/12.    Consultants:  Dr  Jerilee Field, urology.  Dr  Cyril Mourning, PCCM.  Procedures:  CXR.  Renal U/S.   Antibiotics:  Rocephin 11/18/12. Only.  Zosyn 11/19/12 only.   Meropenem 11/19/12>>>  HPI/Subjective: Feels better.   Objective: Vital signs in last 24 hours: Temp:  [97.9 F (36.6 C)-98.7 F (37.1 C)] 97.9 F (36.6 C) (09/16 0616) Pulse Rate:  [63-78] 74 (09/16 0616) Resp:  [8-18] 16 (09/16 0616) BP: (119-175)/(74-103) 160/103 mmHg (09/16 0616) SpO2:  [96 %-100 %] 100 % (09/16 0616) Weight:  [97.7 kg (215 lb 6.2 oz)] 97.7 kg (215 lb 6.2 oz) (09/15 2054) Weight change: -7.6 kg (-16 lb 12.1 oz) Last BM Date: 11/20/12  Intake/Output from previous day: 09/15 0701 - 09/16 0700 In: 786.2 [I.V.:251.2; IV Piggyback:535] Out: 3700 [Urine:3700]     Physical Exam: General: Comfortable, alert, communicative, fully oriented, not short of breath at rest. Appears mildly anasarcic. HEENT:  Mild clinical pallor, no jaundice, no conjunctival injection or discharge. NECK:  Supple, JVP not seen, no carotid bruits, no palpable lymphadenopathy, no palpable goiter. Has central line on left side of neck.  CHEST:  Clinically clear to auscultation, no wheezes, no crackles. HEART:  Sounds 1 and 2 heard, normal, regular, no murmurs. ABDOMEN:  Morbidly obese, soft, non-tender, no palpable organomegaly, no palpable masses, normal bowel sounds. GENITALIA:  Not examined. LOWER EXTREMITIES:  Moderate pitting edema, palpable peripheral pulses. MUSCULOSKELETAL SYSTEM:  Unremarkable. CENTRAL NERVOUS SYSTEM:  Stable left hemiparesia.  Lab Results:  Recent Labs  11/21/12 0445 11/22/12 0500  WBC 4.2 3.7*  HGB 11.7* 11.6*  HCT 37.1 36.5  PLT 38* 41*    Recent Labs  11/21/12 0445 11/22/12 0500  NA 145 141  K 3.2* 3.6  CL 107 101  CO2 30 33*  GLUCOSE 85 79  BUN 3* <3*  CREATININE 0.41* 0.40*  CALCIUM 8.1* 8.6   Recent Results (from the past 240 hour(s))  CULTURE, BLOOD (ROUTINE X 2)     Status: None   Collection Time    11/18/12   2:38 PM      Result Value Range Status   Specimen Description BLOOD ARM LEFT   Final   Special Requests BOTTLES DRAWN AEROBIC ONLY 1CC   Final   Culture  Setup Time     Final   Value: 11/19/2012 02:47     Performed at [facility]   Culture     Final   Value:        BLOOD CULTURE RECEIVED NO GROWTH TO DATE CULTURE WILL BE HELD FOR 5 DAYS BEFORE ISSUING A FINAL NEGATIVE REPORT     Performed at [facility]   Report Status PENDING   Incomplete  CULTURE, BLOOD (ROUTINE X 2)     Status: None   Collection Time    11/18/12  2:45 PM      Result Value Range Status   Specimen Description BLOOD HAND RIGHT   Final   Special Requests BOTTLES DRAWN AEROBIC ONLY 3CC   Final   Culture  Setup Time     Final   Value: 11/19/2012 02:47     Performed at [facility]   Culture     Final   Value:        BLOOD CULTURE RECEIVED NO GROWTH TO DATE CULTURE WILL BE HELD FOR 5 DAYS BEFORE ISSUING A FINAL NEGATIVE REPORT     Performed at [facility]   Report Status PENDING   Incomplete  MRSA PCR  SCREENING     Status: None   Collection Time    11/18/12  6:47 PM      Result Value Range Status   MRSA by PCR NEGATIVE  NEGATIVE Final   Comment:            The GeneXpert MRSA Assay (FDA     approved for NASAL specimens     only), is one component of a     comprehensive MRSA colonization     surveillance program. It is not     intended to diagnose MRSA     infection nor to guide or     monitor treatment for     MRSA infections.  MRSA PCR SCREENING     Status: None   Collection Time    11/19/12 10:33 AM      Result Value Range Status   MRSA by PCR NEGATIVE  NEGATIVE Final   Comment:            The GeneXpert MRSA Assay (FDA     approved for NASAL specimens     only), is one component of a     comprehensive MRSA colonization     surveillance program. It is not     intended to diagnose MRSA     infection nor to guide or     monitor treatment for     MRSA infections.      Studies/Results: No results found.  Medications: Scheduled Meds: . cholecalciferol  3,000 Units Oral q morning - 10a  . divalproex  500 mg Oral Q12H  . feeding supplement  237 mL Oral BID BM  . levETIRAcetam  1,500 mg Oral BID  . levothyroxine  100 mcg Oral QAC breakfast  . meropenem (MERREM) IV  500 mg Intravenous Q8H   Continuous Infusions: . sodium chloride 10 mL/hr at 11/21/12 1953   PRN Meds:.acetaminophen, acetaminophen, albuterol, benzocaine, bisacodyl, camphor-menthol, LORazepam, olopatadine, ondansetron (ZOFRAN) IV, ondansetron, sodium chloride, triamcinolone cream    LOS: 4 days   Ixel Boehning,CHRISTOPHER  [facility] Pager [phone]. If 8PM-8AM, please contact night-coverage at [email or web address], password [facility] 11/22/2012, 9:01 AM  LOS: 4 days

## 2012-11-22 NOTE — Progress Notes (Signed)
ANTIBIOTIC CONSULT NOTE - Follow-up  Pharmacy Consult for Meropenem Indication: UTI with history of ESBL organisms  Allergies  Allergen Reactions  . Codeine Other (See Comments)    unknown    Patient Measurements: Height: 5' 1.81" (157 cm) Weight: 215 lb 6.2 oz (97.7 kg) IBW/kg (Calculated) : 49.67  Vital Signs: Temp: 97.9 F (36.6 C) (09/16 0616) Temp src: Axillary (09/16 0616) BP: 160/103 mmHg (09/16 0616) Pulse Rate: 74 (09/16 0616) Intake/Output from previous day: 09/15 0701 - 09/16 0700 In: 786.2 [I.V.:251.2; IV Piggyback:535] Out: 3700 [Urine:3700] Intake/Output from this shift:    Labs:  Recent Labs  11/20/12 0345 11/21/12 0445 11/22/12 0500  WBC 4.2 4.2 3.7*  HGB 11.4* 11.7* 11.6*  PLT 39* 38* 41*  CREATININE 0.52 0.41* 0.40*   Estimated Creatinine Clearance: 84.4 ml/min (by C-G formula based on Cr of 0.4). No results found for this basename: VANCOTROUGH, VANCOPEAK, VANCORANDOM, GENTTROUGH, GENTPEAK, GENTRANDOM, TOBRATROUGH, TOBRAPEAK, TOBRARND, AMIKACINPEAK, AMIKACINTROU, AMIKACIN,  in the last 72 hours   Microbiology: Recent Results (from the past 720 hour(s))  CULTURE, BLOOD (ROUTINE X 2)     Status: None   Collection Time    11/18/12  2:38 PM      Result Value Range Status   Specimen Description BLOOD ARM LEFT   Final   Special Requests BOTTLES DRAWN AEROBIC ONLY 1CC   Final   Culture  Setup Time     Final   Value: 11/19/2012 02:47     Performed at [facility]   Culture     Final   Value:        BLOOD CULTURE RECEIVED NO GROWTH TO DATE CULTURE WILL BE HELD FOR 5 DAYS BEFORE ISSUING A FINAL NEGATIVE REPORT     Performed at [facility]   Report Status PENDING   Incomplete  CULTURE, BLOOD (ROUTINE X 2)     Status: None   Collection Time    11/18/12  2:45 PM      Result Value Range Status   Specimen Description BLOOD HAND RIGHT   Final   Special Requests BOTTLES DRAWN AEROBIC ONLY 3CC   Final   Culture  Setup Time     Final   Value: 11/19/2012 02:47     Performed at [facility]   Culture     Final   Value:        BLOOD CULTURE RECEIVED NO GROWTH TO DATE CULTURE WILL BE HELD FOR 5 DAYS BEFORE ISSUING A FINAL NEGATIVE REPORT     Performed at [facility]   Report Status PENDING   Incomplete  MRSA PCR SCREENING     Status: None   Collection Time    11/18/12  6:47 PM      Result Value Range Status   MRSA by PCR NEGATIVE  NEGATIVE Final   Comment:            The GeneXpert MRSA Assay (FDA     approved for NASAL specimens     only), is one component of a     comprehensive MRSA colonization     surveillance program. It is not     intended to diagnose MRSA     infection nor to guide or     monitor treatment for     MRSA infections.  MRSA PCR SCREENING     Status: None   Collection Time    11/19/12 10:33 AM      Result Value Range  Status   MRSA by PCR NEGATIVE  NEGATIVE Final   Comment:            The GeneXpert MRSA Assay (FDA     approved for NASAL specimens     only), is one component of a     comprehensive MRSA colonization     surveillance program. It is not     intended to diagnose MRSA     infection nor to guide or     monitor treatment for     MRSA infections.    Medications:  Anti-infectives   Start     Dose/Rate Route Frequency Ordered Stop   11/19/12 1700  cefTRIAXone (ROCEPHIN) 1 g in dextrose 5 % 50 mL IVPB  Status:  Discontinued     1 g 100 mL/hr over 30 Minutes Intravenous Every 24 hours 11/18/12 1856 11/19/12 0832   11/19/12 1200  meropenem (MERREM) 500 mg in sodium chloride 0.9 % 50 mL IVPB     500 mg 100 mL/hr over 30 Minutes Intravenous Every 8 hours 11/19/12 1102     11/19/12 0845  piperacillin-tazobactam (ZOSYN) IVPB 3.375 g  Status:  Discontinued     3.375 g 12.5 mL/hr over 240 Minutes Intravenous 4 times per day 11/19/12 0832 11/19/12 1036   11/18/12 1615  cefTRIAXone (ROCEPHIN) 1 g in dextrose 5 % 50 mL IVPB     1 g 100 mL/hr over 30 Minutes Intravenous   Once 11/18/12 1611 11/18/12 1737     Assessment: 57 year old female admitted with sepsis and possible UTI.  She was started on empiric therapy with Rocephin that was broadened to Zosyn today.  She is well-known to pharmacy from her history of MDR organisms isolated in her urine, most recently an ESBL-producing E. Coli.  She has a seizure history which impacts carbapenem choice for her.  Plan:  1. Continue meropenem 500mg  IV Q8H 2. F/u renal fxn, C&S, clinical status and LOT  Lysle Pearl, PharmD, BCPS Pager # [phone] 11/22/2012 8:04 AM

## 2012-11-23 ENCOUNTER — Encounter (HOSPITAL_COMMUNITY): Payer: Self-pay

## 2012-11-23 DIAGNOSIS — I10 Essential (primary) hypertension: Secondary | ICD-10-CM

## 2012-11-23 LAB — BASIC METABOLIC PANEL
CO2: 34 mEq/L — ABNORMAL HIGH (ref 19–32)
Calcium: 8.9 mg/dL (ref 8.4–10.5)
Glucose, Bld: 108 mg/dL — ABNORMAL HIGH (ref 70–99)
Potassium: 3.3 mEq/L — ABNORMAL LOW (ref 3.5–5.1)
Sodium: 139 mEq/L (ref 135–145)

## 2012-11-23 LAB — CBC
Hemoglobin: 11.2 g/dL — ABNORMAL LOW (ref 12.0–15.0)
MCH: 29.3 pg (ref 26.0–34.0)
Platelets: 63 10*3/uL — ABNORMAL LOW (ref 150–400)
RBC: 3.82 MIL/uL — ABNORMAL LOW (ref 3.87–5.11)
WBC: 4.3 10*3/uL (ref 4.0–10.5)

## 2012-11-23 MED ORDER — LEVETIRACETAM 1000 MG PO TABS: 1500.0000 mg | ORAL_TABLET | Freq: Two times a day (BID) | ORAL | Status: DC

## 2012-11-23 MED ORDER — LEVOTHYROXINE SODIUM 100 MCG PO TABS: 100.0000 ug | ORAL_TABLET | Freq: Every day | ORAL | Status: DC

## 2012-11-23 MED ORDER — POTASSIUM CHLORIDE CRYS ER 20 MEQ PO TBCR
40.0000 meq | EXTENDED_RELEASE_TABLET | Freq: Once | ORAL | Status: AC
  Administered 2012-11-23: 40 meq via ORAL
  Filled 2012-11-23: qty 2

## 2012-11-23 MED ORDER — SODIUM CHLORIDE 0.9 % IV SOLN: 500.0000 mg | Freq: Three times a day (TID) | INTRAVENOUS | Status: DC

## 2012-11-23 MED ORDER — LABETALOL HCL 100 MG PO TABS: 100.0000 mg | ORAL_TABLET | Freq: Two times a day (BID) | ORAL | Status: DC

## 2012-11-23 MED ORDER — HEPARIN SOD (PORK) LOCK FLUSH 100 UNIT/ML IV SOLN
250.0000 [IU] | INTRAVENOUS | Status: DC | PRN
  Administered 2012-11-23: 500 [IU]

## 2012-11-23 NOTE — Discharge Summary (Signed)
Physician Discharge Summary  [name] AVW:[id number] DOB: 09-14-1955 DOA: 11/18/2012  PCP: Terald Sleeper, MD  Admit date: 11/18/2012 Discharge date: 11/23/2012  Time spent: Greater than 30 minutes  Recommendations for Outpatient Follow-up:  1. Discontinue picc line after meropenem completed on 11/29/12. Repeat TSH in 3 months.  Check CBC in one week.  Adjust blood pressure medications as needed.  Discharge Diagnoses:  Active Problems:   Morbid obesity   History of stroke   Chronic pain disorder   Thrombocytopenia, unspecified   Central sleep apnea   Hemiparesis affecting left side as late effect of cerebrovascular accident   Hypothyroidism   Seizure disorder   UTI (lower urinary tract infection)   Sepsis   HTN (hypertension)  Discharge Condition: stable  Filed Weights   11/20/12 0100 11/20/12 0800 11/21/12 2054  Weight: 96.3 kg (212 lb 4.9 oz) 105.3 kg (232 lb 2.3 oz) 97.7 kg (215 lb 6.2 oz)    History of present illness:  [name] is a 58 y.o. female with PMH of HTN, , hypothyroidism, HPL, h/o CVA with R sided paralysis, recurrent UTIs was at neurology appt at [facility] today, found to have progressively declining BP while in the office and brought to the ED; She was given two liter of NS in ED with mild improvement in BP response; Patient denies fever, chills, no cough, no chest pain, no nausea vomiting diarrhea;  -she had cath in ED with no significant urine in bladder, small amount of thick, purulent drainage from bladder cath; started on atx in ED for possible UTI/sepsis   Hospital Course:  58 y.o. female with PMH of HTN, hypothyroidism, HLD, h/o CVA with left hemiparesis, recurrent UTIs was at neurology appt at [facility] and found to have progressively declining BP while in the office and brought to the ED. She was given two liter of NS in ED with mild improvement in BP response. Patient denied fever, chills, cough, chest pain, nausea vomiting diarrhea.  She had cath in ED with no significant urine in bladder, small amount of thick, purulent drainage from bladder cath; started on abx in ED for possible UTI/sepsis. Developed hypotension despite 4L fluids, septic shock 9/13 and PCCM consulted and assumed care of the patient. Transferred to [facility] service on 11/21/12.   1. Sepsis/UTI (lower urinary tract infection)/history of ESBL E. Coli: Patient presented with hypotension, refractory to initial aggressive ivi fluid resuscitation, and bladder catheterization revealed purulent urine. She was initially place on iv Zosyn, but given previous bacteriologic data, and possibility of ESBL E.Coli, she was switched to iv Meropenem, and managed under the auspices of PCCM, per sepsis protocol. Clinical response was satisfactory thereafter, and as of 11/20/12, sepsis physiology had resolved, patient was hemodynamically stable, and she was transferred back to medical service, effective 11/21/12. Blood cultures have remained negative so far. Urine culture, was ordered, but unfortunately, not done. Planning for a total of 10 days Meropenem therapy, to be concluded on 11/28/12.   Patient has right arm PICC line, which may be removed after Meropenum completed. 2. Thrombocytopenia, unspecified: Patient has known chronic thrombocytopenia, with baseline platelet count in the 120K-130K range. Platelet count dropped during hospitalization, to reach a low of 38K on 11/21/12. Suspect due to gram negative infection. Improving.  63K today.  Recheck in one week 3. Urolithiasis. Renal U/S revealed a 13 mm nonobstructing right mid pole stone. This is a likely predisposition to patient's recurrent UTIs. Dr Mena Goes provided urology consultation, and has opined that no  urgent intervention is indicated at this time. He plans to discuss management options with patient in outpatient environment, when she is feeling well, and feels she will benefit from a post-void check in office (given neurologic history)  and cystoscopy (rule out urethral diverticulum, fistula, etc.).   As SNF is in [place], will arrange f/u in [facility] [place] office. 4. Central sleep apnea/Morbid obesity: Patient appears to have OSA, clinically. She will benefit from outpt evaluation/PSG and consideration for CPAP v/s BIPAP.  5. Hypothyroidism: TSH above 5.  Levothyroxine dose increased from 68.5 to 100.  Needs TSH check in 3 months. 6. Seizure disorder: stable on Depakote and Keppra  7. HTN (hypertension): BP meds held due to septic shock.  May resume labetalol 100 mg bid, then resume other antihypertensives as BP increases 8. History of stroke/Hemiparesis affecting left side as late effect of cerebrovascular accident: Stable. No new focal deficits. Evaluated by SLP, initially placed on D3/Thin, but upgraded on 11/22/12, to Regular diet/Thin.    Procedures:  Left IJ central line  Right arm PICC  Consultations:  Critical Care, Dr. Vassie Loll  Urology, [place]  Discharge Exam: Filed Vitals:   11/23/12 0504  BP: 149/94  Pulse: 91  Temp: 98.1 F (36.7 C)  Resp: 16   General: Alert, talkative, oriented and smiling Cardiovascular: RRR without MGR Respiratory: CTA without WRR Ext: no pitting edema  Discharge Instructions  Discharge Orders   Future Orders Complete By Expires   Diet - low sodium heart healthy  As directed    Discharge instructions  As directed    Comments:     Discontinue picc line after meropenem completed on 11/29/12. Repeat TSH in 3 months.  Check CBC in one week.  Adjust blood pressure medications as need.   Walk with assistance  As directed        Medication List    STOP taking these medications       amLODipine 5 MG tablet  Commonly known as:  NORVASC     losartan 50 MG tablet  Commonly known as:  COZAAR      TAKE these medications       ANBESOL 10 % mucosal gel  Generic drug:  benzocaine  Use as directed 1 application in the mouth or throat as needed for pain.      aspirin EC 81 MG tablet  Take 81 mg by mouth daily.     bisacodyl 10 MG suppository  Commonly known as:  DULCOLAX  Place 10 mg rectally daily as needed for constipation.     camphor-menthol lotion  Commonly known as:  SARNA  Apply 1 application topically as needed for itching.     cholecalciferol 1000 UNITS tablet  Commonly known as:  VITAMIN D  Take 3,000 Units by mouth every morning.     cloNIDine 0.1 MG tablet  Commonly known as:  CATAPRES  Take 0.1 mg by mouth every 6 (six) hours as needed (for SBP greater than 160).     divalproex 500 MG DR tablet  Commonly known as:  DEPAKOTE  Take 500 mg by mouth 2 (two) times daily.     DULoxetine 60 MG capsule  Commonly known as:  CYMBALTA  Take 60 mg by mouth daily.     DUONEB 0.5-2.5 (3) MG/3ML Soln  Generic drug:  ipratropium-albuterol  Take 3 mLs by nebulization every 6 (six) hours as needed (for shortness of breath/wheezing).     hydrOXYzine 25 MG tablet  Commonly known as:  ATARAX/VISTARIL  Take 12.5 mg by mouth daily as needed for itching or anxiety.     labetalol 100 MG tablet  Commonly known as:  NORMODYNE  Take 1 tablet (100 mg total) by mouth 2 (two) times daily.     levETIRAcetam 1000 MG tablet  Commonly known as:  KEPPRA  Take 1.5 tablets (1,500 mg total) by mouth 2 (two) times daily.     levothyroxine 100 MCG tablet  Commonly known as:  SYNTHROID, LEVOTHROID  Take 1 tablet (100 mcg total) by mouth daily before breakfast.     LORazepam 2 MG/ML injection  Commonly known as:  ATIVAN  Inject 1.5 mg into the vein as needed for seizure.     OCUSOFT LID SCRUB Pads  Place 1 each into both eyes every morning.     olopatadine 0.1 % ophthalmic solution  Commonly known as:  PATANOL  Place 1 drop into both eyes 2 (two) times daily as needed for allergies (every morning and as needed).     omeprazole 20 MG capsule  Commonly known as:  PRILOSEC  Take 20 mg by mouth daily.     oxyCODONE 5 MG immediate release  tablet  Commonly known as:  Oxy IR/ROXICODONE  Take 5 mg by mouth every 8 (eight) hours as needed for pain (for moderate to severe pain).     polyethylene glycol packet  Commonly known as:  MIRALAX / GLYCOLAX  Take 17 g by mouth daily.     selenium sulfide 1 % Lotn  Commonly known as:  SELSUN  Apply 1 application topically 3 (three) times a week.     simvastatin 20 MG tablet  Commonly known as:  ZOCOR  Take 20 mg by mouth at bedtime.     sodium chloride 0.9 % SOLN 50 mL with meropenem 500 MG SOLR 500 mg  Inject 500 mg into the vein every 8 (eight) hours. Through 11/28/12 then stop     triamcinolone cream 0.1 %  Commonly known as:  KENALOG  Apply 1 application topically 2 (two) times daily as needed (to rash).     TYLENOL 167 MG/5ML Liqd  Generic drug:  Acetaminophen  Take 1,000 mg by mouth every 8 (eight) hours as needed (for pain).     zolpidem 5 MG tablet  Commonly known as:  AMBIEN  Take 2.5 mg by mouth at bedtime as needed for sleep.       Allergies  Allergen Reactions  . Codeine Other (See Comments)    unknown       Follow-up Information   Follow up with DAHLSTEDT, Bertram Millard, MD. (2-3 weeks. [place] office)    Specialty:  Urology   Contact information:   [address] [place] [address] [phone]       Follow up with Terald Sleeper, MD In 1 week.   Specialty:  Internal Medicine   Contact information:   [address] [place] [address] [phone]        The results of significant diagnostics from this hospitalization (including imaging, microbiology, ancillary and laboratory) are listed below for reference.    Significant Diagnostic Studies: US Renal Port  11/19/2012   CLINICAL DATA:  Question hydronephrosis.  EXAM: RENAL/URINARY TRACT ULTRASOUND COMPLETE  COMPARISON:  None.  FINDINGS: Right Kidney: 9.3 cm. 1.3 cm in the midpole. No hydronephrosis. No focal abnormality. Normal echotexture.  Left Kidney: 9.0 cm.  Difficult to visualize, but no visible focal abnormality or hydronephrosis. Echotexture appears  normal.  Bladder:  Decompressed with Foley catheter in place.  IMPRESSION: 13 mm nonobstructing right midpole stone.  No hydronephrosis.   Electronically Signed   By: Charlett Nose M.D.   On: 11/19/2012 16:58   Ir Fluoro Guide Cv Line Right  11/22/2012   CLINICAL DATA:  57 year old with urinary tract infection. Patient may need long-term antibiotics. Left jugular central line is not working.  EXAM: PICC LINE PLACEMENT WITH ULTRASOUND AND FLUOROSCOPIC  GUIDANCE  FLUOROSCOPY TIME:  14 seconds  PROCEDURE: The right arm was prepped with chlorhexidine, draped in the usual sterile fashion using maximum barrier technique (cap and mask, sterile gown, sterile gloves, large sterile sheet, hand hygiene and cutaneous antisepsis) and infiltrated locally with 1% Lidocaine.  Ultrasound demonstrated patency of the right basilic vein, and this was documented with an image. Under real-time ultrasound guidance, this vein was accessed with a 21 gauge micropuncture needle and image documentation was performed. The needle was exchanged over a guidewire for a peel-away sheath through which a 5 [place] dual lumen PICC trimmed to 40cm was advanced, positioned with its tip at the lower SVC/right atrial junction. Fluoroscopy during the procedure and fluoro spot radiograph confirms appropriate catheter position. The catheter was flushed, secured to the skin with Prolene sutures, and covered with a sterile dressing.  Complications:  None  IMPRESSION: Successful right arm PICC line placement with ultrasound and fluoroscopic guidance. The catheter is ready for use.   Electronically Signed   By: Richarda Overlie M.D.   On: 11/22/2012 15:57   Ir US Guide Vasc Access Right  11/22/2012   CLINICAL DATA:  57 year old with urinary tract infection. Patient may need long-term antibiotics. Left jugular central line is not working.  EXAM: PICC LINE PLACEMENT WITH  ULTRASOUND AND FLUOROSCOPIC  GUIDANCE  FLUOROSCOPY TIME:  14 seconds  PROCEDURE: The right arm was prepped with chlorhexidine, draped in the usual sterile fashion using maximum barrier technique (cap and mask, sterile gown, sterile gloves, large sterile sheet, hand hygiene and cutaneous antisepsis) and infiltrated locally with 1% Lidocaine.  Ultrasound demonstrated patency of the right basilic vein, and this was documented with an image. Under real-time ultrasound guidance, this vein was accessed with a 21 gauge micropuncture needle and image documentation was performed. The needle was exchanged over a guidewire for a peel-away sheath through which a 5 [place] dual lumen PICC trimmed to 40cm was advanced, positioned with its tip at the lower SVC/right atrial junction. Fluoroscopy during the procedure and fluoro spot radiograph confirms appropriate catheter position. The catheter was flushed, secured to the skin with Prolene sutures, and covered with a sterile dressing.  Complications:  None  IMPRESSION: Successful right arm PICC line placement with ultrasound and fluoroscopic guidance. The catheter is ready for use.   Electronically Signed   By: Richarda Overlie M.D.   On: 11/22/2012 15:57   Dg Chest Port 1 View  11/19/2012   *RADIOLOGY REPORT*  Clinical Data: Central line placement  PORTABLE CHEST - 1 VIEW  Comparison: 11/18/2012  Findings: Left internal jugular central line placed with tip projecting centrally over the mediastinum likely within the left brachial cephalic vein based on position.  Poor inspiratory effect with overlying medical devices.  Right lung is grossly clear.  Left lower lobe consolidation present.  No pneumothorax.  IMPRESSION: Central line as described.  No pneumothorax.  Left lower lobe consolidation.   Original Report Authenticated By: Esperanza Heir, M.D.   Dg Chest [facility]  11/18/2012  CLINICAL DATA:  Chest pain, low heart rate, history of stroke  EXAM: PORTABLE CHEST - 1 VIEW   COMPARISON:  09/20/2012  FINDINGS: Study is limited by poor inspiration. Cardiomediastinal silhouette is stable. Mild basilar atelectasis. Mild elevation of the right hemidiaphragm again noted. No acute infiltrate or pulmonary edema. Stable left basilar scarring.  IMPRESSION: Limited study by poor inspiration. Mild basilar atelectasis. No acute infiltrate or pulmonary edema.   Electronically Signed   By: Natasha Mead   On: 11/18/2012 13:31    Microbiology: Recent Results (from the past 240 hour(s))  CULTURE, BLOOD (ROUTINE X 2)     Status: None   Collection Time    11/18/12  2:38 PM      Result Value Range Status   Specimen Description BLOOD ARM LEFT   Final   Special Requests BOTTLES DRAWN AEROBIC ONLY 1CC   Final   Culture  Setup Time     Final   Value: 11/19/2012 02:47     Performed at [facility]   Culture     Final   Value:        BLOOD CULTURE RECEIVED NO GROWTH TO DATE CULTURE WILL BE HELD FOR 5 DAYS BEFORE ISSUING A FINAL NEGATIVE REPORT     Performed at [facility]   Report Status PENDING   Incomplete  CULTURE, BLOOD (ROUTINE X 2)     Status: None   Collection Time    11/18/12  2:45 PM      Result Value Range Status   Specimen Description BLOOD HAND RIGHT   Final   Special Requests BOTTLES DRAWN AEROBIC ONLY 3CC   Final   Culture  Setup Time     Final   Value: 11/19/2012 02:47     Performed at [facility]   Culture     Final   Value:        BLOOD CULTURE RECEIVED NO GROWTH TO DATE CULTURE WILL BE HELD FOR 5 DAYS BEFORE ISSUING A FINAL NEGATIVE REPORT     Performed at [facility]   Report Status PENDING   Incomplete  MRSA PCR SCREENING     Status: None   Collection Time    11/18/12  6:47 PM      Result Value Range Status   MRSA by PCR NEGATIVE  NEGATIVE Final   Comment:            The GeneXpert MRSA Assay (FDA     approved for NASAL specimens     only), is one component of a     comprehensive MRSA colonization     surveillance  program. It is not     intended to diagnose MRSA     infection nor to guide or     monitor treatment for     MRSA infections.  MRSA PCR SCREENING     Status: None   Collection Time    11/19/12 10:33 AM      Result Value Range Status   MRSA by PCR NEGATIVE  NEGATIVE Final   Comment:            The GeneXpert MRSA Assay (FDA     approved for NASAL specimens     only), is one component of a     comprehensive MRSA colonization     surveillance program. It is not     intended to diagnose MRSA     infection nor to guide or  monitor treatment for     MRSA infections.     Labs: Basic Metabolic Panel:  Recent Labs Lab 11/19/12 0500 11/20/12 0345 11/21/12 0445 11/22/12 0500 11/23/12 0615  NA 153* 147* 145 141 139  K 3.7 2.9* 3.2* 3.6 3.3*  CL 117* 114* 107 101 98  CO2 29 27 30  33* 34*  GLUCOSE 77 108* 85 79 108*  BUN 11 7 3* <3* 6  CREATININE 0.63 0.52 0.41* 0.40* 0.44*  CALCIUM 7.7* 7.7* 8.1* 8.6 8.9  MG  --  1.5  --   --   --   PHOS  --  2.3  --   --   --    Liver Function Tests: No results found for this basename: AST, ALT, ALKPHOS, BILITOT, PROT, ALBUMIN,  in the last 168 hours No results found for this basename: LIPASE, AMYLASE,  in the last 168 hours No results found for this basename: AMMONIA,  in the last 168 hours CBC:  Recent Labs Lab 11/18/12 1439 11/19/12 0500 11/20/12 0345 11/21/12 0445 11/22/12 0500 11/23/12 0615  WBC 4.3 4.7 4.2 4.2 3.7* 4.3  NEUTROABS 1.2*  --   --   --   --   --   HGB 11.5* 11.7* 11.4* 11.7* 11.6* 11.2*  HCT 37.2 38.9 37.4 37.1 36.5 33.7*  MCV 94.2 96.5 94.7 91.6 90.3 88.2  PLT 57* 46* 39* 38* 41* 63*   Cardiac Enzymes:  Recent Labs Lab 11/18/12 1439  TROPONINI <0.30   BNP: BNP (last 3 results)  Recent Labs  09/18/12 0340  PROBNP 180.8*   CBG:  Recent Labs Lab 11/21/12 0819 11/21/12 1138 11/21/12 1628 11/21/12 1951 11/22/12 0814  GLUCAP 72 82 87 87 78   Signed:  Velencia Lenart L  [facility] 11/23/2012, 11:01 AM

## 2012-11-23 NOTE — Progress Notes (Signed)
Ptar her to pick up patient.

## 2012-11-23 NOTE — Progress Notes (Addendum)
Report called to [place] at [facility] facility.  Patient to be going with picc line for long term antibiotics. [facility] will transport patient to facility.  Went over discharge instructions with nurse at facility and patient.

## 2012-11-25 LAB — CULTURE, BLOOD (ROUTINE X 2)
Culture: NO GROWTH
Culture: NO GROWTH

## 2012-12-27 ENCOUNTER — Ambulatory Visit (INDEPENDENT_AMBULATORY_CARE_PROVIDER_SITE_OTHER): Payer: PRIVATE HEALTH INSURANCE | Admitting: Urology

## 2012-12-27 ENCOUNTER — Other Ambulatory Visit: Payer: Self-pay | Admitting: Urology

## 2012-12-27 ENCOUNTER — Encounter (INDEPENDENT_AMBULATORY_CARE_PROVIDER_SITE_OTHER): Payer: Self-pay

## 2012-12-27 DIAGNOSIS — N200 Calculus of kidney: Secondary | ICD-10-CM

## 2012-12-27 DIAGNOSIS — N390 Urinary tract infection, site not specified: Secondary | ICD-10-CM

## 2013-01-01 ENCOUNTER — Non-Acute Institutional Stay (SKILLED_NURSING_FACILITY): Payer: PRIVATE HEALTH INSURANCE | Admitting: Internal Medicine

## 2013-01-01 DIAGNOSIS — A419 Sepsis, unspecified organism: Secondary | ICD-10-CM

## 2013-01-01 DIAGNOSIS — I69959 Hemiplegia and hemiparesis following unspecified cerebrovascular disease affecting unspecified side: Secondary | ICD-10-CM

## 2013-01-01 DIAGNOSIS — D696 Thrombocytopenia, unspecified: Secondary | ICD-10-CM

## 2013-01-01 DIAGNOSIS — N200 Calculus of kidney: Secondary | ICD-10-CM

## 2013-01-01 NOTE — Progress Notes (Signed)
Patient ID: [name], female   DOB: [birth year], 58 y.o.   MRN: [id number]  Facility [facility]  Chief complaint review of medical issues and routine [facility] visit for [employer]. The admission to the facility post stay at [facility] September 12 through September 17 His; this is a patient who has been in the facility for many years. 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 She has very little use of her right leg as well. At some point she had an internal thecal pump loaded with baclofen and fentanyl. I gather the pump has been changed to morphine. She has had several episodes of respiratory failure related to this. She was hospitalized in July for this but was also felt to have a UTI treated with Invanz. Previous hospitalization in May with acute encephalopathy secondary to unintentional narcotic/opiate overdose in the setting of a chronic baclofen pump. She is also felt to have central sleep apnea. She is followed by Dr.kahn for her chronic pain syndrome.  The patient was hospitalized after being found hypotensive in her neurologists office. She was given 2 L of normal saline in the field and then several liters of normal saline once she arrived in the hospital. In spite of this blood pressure remained low. There is some comment about Z. ESBL Escherichia coli however I can see no evidence of her urine or blood cultures during this hospitalization. She was felt to have purulent urine when she was catheterized however. She received aggressive antibiotic therapy lastly with Meropenem. Renal ultrasound showed a 13 mm nonobstructing right midpole stone. She was seen by urology at all no urgent intervention was felt indicated. A post void urine check was recommended and a cystoscopy. Was felt she could fall with [facility] in [facility].  Thrombocytopenia in hospital was felt to be secondary to a gram-negative infection. The lower was  38,000, at discharge 41. Recheck of this was suggested in one week although I don't see this. It is also listed that she has chronic thrombocytopenia over the last platelet count I see from June 12 was 165,000  Past Medical History  Diagnosis Date  . Hypertension   . Seizure disorder   . Metabolic encephalopathy 05/06/2010  . Respiratory failure 05/06/10  . UTI (lower urinary tract infection) 05/06/10  . 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 Sleep apnea 04/2010    on CPAP, "severe central sleep apnea"  . 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 Chronic pain   . Depression   . 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 Spasticity     chronic  . Diabetes mellitus without complication   . Thrombocytopenia, unspecified 07/28/2012  . Acute respiratory failure 07/27/2012  . Narcotic overdose 07/27/2012  . 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 Unspecified hypothyroidism   . Sleep apnea   . Seizures   . Arthritis     OSTEO LEFT LEG   Past Surgical History  Procedure Laterality Date  . Intrathecal pump implantation  2010    [employer]:  fentanyl, baclofen changed to morphine and baclofen on 07/2012  . Cerebral aneurysm repair  1999   Medication list is reviewed.  Social history long-standing resident of this facility. She remains a full code.  Physical exam O2 sat is 93% on room and her respirations 18, pulse 74. She is afebrile.  Gen.; patient is bright alert  and conversational Respiratory shallow, but otherwise clear entry bilaterally work of breathing is normal. Cardiac heart sounds are normal. There is no murmurs. No signs of CHF. Neurologic; dense left hemiparesis. She has a tremor of her right hand. Some minimal use of her right leg. Abdomen obese. No masses. No tenderness. Neurologic; I see note change in her status  Impression/plan #1 urosepsis with refractory hypotension. Do not see that she either had blood or urine  cultures done. Nevertheless she has improved. She was felt to require workup as an outpatient for her nephrolithiasis, post void residual and probably a cystoscopy. She followed with [facility] on October 21. A urine culture was done, CT scan with renal stone protocol per pelvic stone lithotripsy  or ureteroscopically was suggested #2 severe long-standing neurologic damage related to bihemispheric strokes at the time of a cerebral aneurysm repair #3 thrombocytopenia in the hospital probably secondary to gram-negative infection I will recheck this #4 multiple episodes of inadvertent narcotic overdose on top of centrally mediated sleep apnea. I am uncertain where we are with this. Previously she had had an intrathecal pump with both sleep and muscle relaxants and narcotics I believe this is just morphine currently

## 2013-01-06 ENCOUNTER — Ambulatory Visit (HOSPITAL_COMMUNITY)
Admission: RE | Admit: 2013-01-06 | Discharge: 2013-01-06 | Disposition: A | Discharge status: Home / Self Care | Payer: PRIVATE HEALTH INSURANCE | Source: Ambulatory Visit | Attending: Urology | Admitting: Urology

## 2013-01-06 DIAGNOSIS — N200 Calculus of kidney: Secondary | ICD-10-CM | POA: Insufficient documentation

## 2013-01-06 DIAGNOSIS — I7090 Unspecified atherosclerosis: Secondary | ICD-10-CM | POA: Insufficient documentation

## 2013-01-06 DIAGNOSIS — D252 Subserosal leiomyoma of uterus: Secondary | ICD-10-CM | POA: Insufficient documentation

## 2013-01-06 DIAGNOSIS — D259 Leiomyoma of uterus, unspecified: Secondary | ICD-10-CM | POA: Insufficient documentation

## 2013-01-30 ENCOUNTER — Non-Acute Institutional Stay (SKILLED_NURSING_FACILITY): Payer: PRIVATE HEALTH INSURANCE | Admitting: Internal Medicine

## 2013-01-30 DIAGNOSIS — D696 Thrombocytopenia, unspecified: Secondary | ICD-10-CM

## 2013-01-30 DIAGNOSIS — N200 Calculus of kidney: Secondary | ICD-10-CM

## 2013-01-30 NOTE — Progress Notes (Signed)
Patient ID: [name], female   DOB: [birth year], 57 y.o.   MRN:   Facility [facility]  Chief complaint review of medical issues and routine [facility] visit for [employer]. The admission to the facility post stay at [facility] September 12 through September 17 His; this is a patient who has been in the facility for many years. 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 She has very little use of her right leg as well. At some point she had an internal thecal pump loaded with baclofen and fentanyl. I gather the pump has been changed to morphine. She has had several episodes of respiratory failure related to this. She was hospitalized in July for this but was also felt to have a UTI treated with Invanz. Previous hospitalization in May with acute encephalopathy secondary to unintentional narcotic/opiate overdose in the setting of a chronic baclofen pump. She is also felt to have central sleep apnea. She is followed by Dr.kahn for her chronic pain syndrome.  The patient was hospitalized after being found hypotensive in her neurologists office. She was given 2 L of normal saline in the field and then several liters of normal saline once she arrived in the hospital. In spite of this blood pressure remained low. There is some comment about Z. ESBL Escherichia coli however I can see no evidence of her urine or blood cultures during this hospitalization. She was felt to have purulent urine when she was catheterized however. She received aggressive antibiotic therapy lastly with Meropenem. Renal ultrasound showed a 13 mm nonobstructing right midpole stone. She was seen by urology at all no urgent intervention was felt indicated. A post void urine check was recommended and a cystoscopy. Was felt she could follow with [facility] in [facility]. He repeat CT scan has been done  Thrombocytopenia in hospital was felt to be secondary to a gram-negative  infection. The lower was 38,000, at discharge 55. Recheck of this was suggested in one week although I don't see this. It is also listed that she has chronic thrombocytopenia over the last platelet count I see from June 12 was 165,000. A repeat platelet count is 163,000 which is normalized on October 29  Past Medical History  Diagnosis Date  . Hypertension   . Seizure disorder   . Metabolic encephalopathy 05/06/2010  . Respiratory failure 05/06/10  . UTI (lower urinary tract infection) 05/06/10  . 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 Sleep apnea 04/2010    on CPAP, "severe central sleep apnea"  . 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 Chronic pain   . Depression   . 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 Spasticity     chronic  . Diabetes mellitus without complication   . Thrombocytopenia, unspecified 07/28/2012  . Acute respiratory failure 07/27/2012  . Narcotic overdose 07/27/2012  . 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 Unspecified hypothyroidism   . Sleep apnea   . Seizures   . Arthritis     OSTEO LEFT LEG   Past Surgical History  Procedure Laterality Date  . Intrathecal pump implantation  2010    [employer]:  fentanyl, baclofen changed to morphine and baclofen on 07/2012  . Cerebral aneurysm repair  1999   Medication list is reviewed.  Social history long-standing resident of this facility. She remains a full code.  Physical exam O2 sat  is 93% on room and her respirations 18, pulse 74. She is afebrile.  Gen.; patient is bright alert and conversational Respiratory shallow, but otherwise clear entry bilaterally work of breathing is normal. Cardiac heart sounds are normal. There is no murmurs. No signs of CHF. Neurologic; dense left hemiparesis. She has a tremor of her right hand. Some minimal use of her right leg. Abdomen obese. No masses. No tenderness. Neurologic; I see note change in her  status  Impression/plan #1 urosepsis with refractory hypotension. Do not see that she either had blood or urine cultures done. Nevertheless she has improved. She was felt to require workup as an outpatient for her nephrolithiasis, post void residual and probably a cystoscopy. She followed with [facility] on October 21. A urine culture was done, CT scan as below #2 severe long-standing neurologic damage related to bihemispheric strokes at the time of a cerebral aneurysm repair #3 thrombocytopenia in the hospital; this has resolved #4 multiple episodes of inadvertent narcotic overdose on top of centrally mediated sleep apnea. I think she only has baclofen and her pump at the current time       Study Result    CLINICAL DATA:  Renal calculi   EXAM: CT ABDOMEN AND PELVIS WITHOUT CONTRAST   TECHNIQUE: Multidetector CT imaging of the abdomen and pelvis was performed following the standard protocol without intravenous contrast.   COMPARISON:  None.   FINDINGS: The lung bases are clear. No pleural or pericardial effusion.   There is mild diffuse low attenuation within the liver parenchyma compatible with fatty infiltration. Gallbladder appears normal. No biliary dilatation.   Normal appearance of the pancreas. The spleen appears unremarkable. The adrenal glands are both normal.   Multiple bilateral renal calculi are identified. The largest right renal stone is in the right renal pelvis measuring 1.1 cm, image 31/series 2. There is right-sided pelvocaliectasis and proximal hydroureter. No ureteral calculi identified. There are multiple left renal calculi. The largest is in the inferior pole measuring 8 mm, image 30/ series 2. No left-sided hydronephrosis or hydroureter. The urinary bladder appears normal. No bladder calculi. Enlarged, partially calcified fibroid uterus is identified. There is a partially calcified subserosal fibroid arising from the left post for lateral uterus  measuring 4.4 cm, image 63/ series 2.   Calcified atherosclerotic change involves the abdominal aorta. There is no aneurysm. No upper abdominal adenopathy. No pelvic or inguinal adenopathy identified.   The stomach appears normal. The small bowel loops have a normal course and caliber without obstruction. Moderate stool burden identified throughout the colon up to the rectum. The patient has a baclofen pump within the subcutaneous soft tissues of the left lower quadrant.   No free fluid or abnormal fluid collections identified within the abdomen or pelvis.   Review of the visualized osseous structures is significant for osteopenia and mild multilevel spondylosis.   IMPRESSION: 1. Bilateral renal calculi. There is a 1.1 cm stone within the right renal pelvis with associated pelvocaliectasis.   2. Fibroid uterus.   3. Calcified atherosclerotic disease.   4. Significant stool burden.     Electronically Signed   By: Signa Kell M.D.   On: 01/06/2013 11:08

## 2013-02-26 ENCOUNTER — Non-Acute Institutional Stay (SKILLED_NURSING_FACILITY): Payer: PRIVATE HEALTH INSURANCE | Admitting: Internal Medicine

## 2013-02-26 DIAGNOSIS — I69959 Hemiplegia and hemiparesis following unspecified cerebrovascular disease affecting unspecified side: Secondary | ICD-10-CM

## 2013-02-26 DIAGNOSIS — N200 Calculus of kidney: Secondary | ICD-10-CM

## 2013-02-26 NOTE — Progress Notes (Signed)
Patient ID: [name], female   DOB: 1955/05/23, 58 y.o.   MRN: [id number]  Facility [facility]  Chief complaint review of medical issues and routine  [employer] visit for November  His; this is a patient who has been in the facility for many years. 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 She has very little use of her right leg as well. At some point she had an intrathecal pump loaded with baclofen and fentanyl. I gather the pump has been changed to morphine. She has had several episodes of respiratory failure related to this. She was hospitalized in July for this but was also felt to have a UTI treated with Invanz. Previous hospitalization in May with acute encephalopathy secondary to unintentional narcotic/opiate overdose in the setting of a chronic baclofen pump. She is also felt to have central sleep apnea. She is followed by Dr.kahn for her chronic pain syndrome.  She follows up by neurology for Botox injection. She seems much better since her intrathecal pump has been adjusted  Lab work recently has shown a hemoglobin A1c of 5.3 and LDL of 111 this was on December 1 CBC with differential in October was normal  Past Medical History  Diagnosis Date  . Hypertension   . Seizure disorder   . Metabolic encephalopathy 05/06/2010  . Respiratory failure 05/06/10  . UTI (lower urinary tract infection) 05/06/10  . 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 Sleep apnea 04/2010    on CPAP, "severe central sleep apnea"  . 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 Chronic pain   . Depression   . 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 Spasticity     chronic  . Diabetes mellitus without complication   . Thrombocytopenia, unspecified 07/28/2012  . Acute respiratory failure 07/27/2012  . Narcotic overdose 07/27/2012  . 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 Unspecified hypothyroidism   . Sleep  apnea   . Seizures   . Arthritis     OSTEO LEFT LEG   Past Surgical History  Procedure Laterality Date  . Intrathecal pump implantation  2010    [employer]:  fentanyl, baclofen changed to morphine and baclofen on 07/2012  . Cerebral aneurysm repair  1999   Medication list is reviewed.  Social history long-standing resident of this facility. She remains a full code.  Physical exam; temperature is 98.2 pulse 72 respirations 17 blood pressure 118/90 Gen.; patient is bright alert and conversational Respiratory shallow, but otherwise clear entry bilaterally work of breathing is normal. Cardiac heart sounds are normal. There is no murmurs. No signs of CHF. Neurologic; dense left hemiparesis. She has a tremor of her right hand. Some minimal use of her right leg. Abdomen obese. No masses. No tenderness. Neurologic; I see note change in her status  Impression/plan #1 urosepsis with refractory hypotension  In October Do not see that she either had blood or urine cultures done. Nevertheless she has improved. She was felt to require workup as an outpatient for her nephrolithiasis, post void residual and probably a cystoscopy. She followed with [facility] on October 21. A urine culture was done, CT scan as below #2 severe long-standing neurologic damage related to bihemispheric strokes at the time of a cerebral aneurysm repair #3 thrombocytopenia in the hospital; this has resolved #4 multiple episodes of inadvertent narcotic overdose on top of centrally mediated sleep  apnea. I think she only has baclofen in her pump at this time. She seems considerably more alert       Study Result    CLINICAL DATA:  Renal calculi   EXAM: CT ABDOMEN AND PELVIS WITHOUT CONTRAST   TECHNIQUE: Multidetector CT imaging of the abdomen and pelvis was performed following the standard protocol without intravenous contrast.   COMPARISON:  None.   FINDINGS: The lung bases are clear. No pleural or pericardial  effusion.   There is mild diffuse low attenuation within the liver parenchyma compatible with fatty infiltration. Gallbladder appears normal. No biliary dilatation.   Normal appearance of the pancreas. The spleen appears unremarkable. The adrenal glands are both normal.   Multiple bilateral renal calculi are identified. The largest right renal stone is in the right renal pelvis measuring 1.1 cm, image 31/series 2. There is right-sided pelvocaliectasis and proximal hydroureter. No ureteral calculi identified. There are multiple left renal calculi. The largest is in the inferior pole measuring 8 mm, image 30/ series 2. No left-sided hydronephrosis or hydroureter. The urinary bladder appears normal. No bladder calculi. Enlarged, partially calcified fibroid uterus is identified. There is a partially calcified subserosal fibroid arising from the left post for lateral uterus measuring 4.4 cm, image 63/ series 2.   Calcified atherosclerotic change involves the abdominal aorta. There is no aneurysm. No upper abdominal adenopathy. No pelvic or inguinal adenopathy identified.   The stomach appears normal. The small bowel loops have a normal course and caliber without obstruction. Moderate stool burden identified throughout the colon up to the rectum. The patient has a baclofen pump within the subcutaneous soft tissues of the left lower quadrant.   No free fluid or abnormal fluid collections identified within the abdomen or pelvis.   Review of the visualized osseous structures is significant for osteopenia and mild multilevel spondylosis.   IMPRESSION: 1. Bilateral renal calculi. There is a 1.1 cm stone within the right renal pelvis with associated pelvocaliectasis.   2. Fibroid uterus.   3. Calcified atherosclerotic disease.   4. Significant stool burden.     Electronically Signed   By: Signa Kell M.D.   On: 01/06/2013 11:08

## 2013-03-10 ENCOUNTER — Ambulatory Visit (HOSPITAL_COMMUNITY)
Admission: RE | Admit: 2013-03-10 | Discharge: 2013-03-10 | Disposition: A | Discharge status: Home / Self Care | Payer: PRIVATE HEALTH INSURANCE | Source: Ambulatory Visit | Attending: Urology | Admitting: Urology

## 2013-03-10 ENCOUNTER — Other Ambulatory Visit: Payer: Self-pay | Admitting: Urology

## 2013-03-10 DIAGNOSIS — N200 Calculus of kidney: Secondary | ICD-10-CM

## 2013-03-10 DIAGNOSIS — D259 Leiomyoma of uterus, unspecified: Secondary | ICD-10-CM | POA: Insufficient documentation

## 2013-04-03 ENCOUNTER — Non-Acute Institutional Stay (SKILLED_NURSING_FACILITY): Payer: PRIVATE HEALTH INSURANCE | Admitting: Internal Medicine

## 2013-04-03 DIAGNOSIS — I69959 Hemiplegia and hemiparesis following unspecified cerebrovascular disease affecting unspecified side: Secondary | ICD-10-CM

## 2013-04-03 DIAGNOSIS — N200 Calculus of kidney: Secondary | ICD-10-CM

## 2013-04-03 NOTE — Progress Notes (Signed)
Patient ID: [name], female   DOB: [birth year], 58 y.o.   MRN: [id number]  [facility]  Chief complaint review of medical issues and routine  [employer] visit for December His; this is a patient who has been in the facility for many years. 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 She has very little use of her right leg as well. At some point she had an intrathecal pump loaded with baclofen and fentanyl. I gather the pump has been changed to morphine. She has had several episodes of respiratory failure related to this. She was hospitalized in July for this but was also felt to have a UTI treated with Invanz. Previous hospitalization in May with acute encephalopathy secondary to unintentional narcotic/opiate overdose in the setting of a chronic baclofen pump. She is also felt to have central sleep apnea. She is followed by Dr.kahn for her chronic pain syndrome.  She was noted to be anxious nad hypertensive since the death of her father this month.  Norvasc was started   She follows up by neurology for Botox injection. She seems much better since her intrathecal pump has been adjusted  Lab work recently has shown a hemoglobin A1c of 5.3 and LDL of 111 this was on December 1 CBC with differential in October was normal  Past Medical History  Diagnosis Date  . Hypertension   . Seizure disorder   . Metabolic encephalopathy 82/70/7867  . Respiratory failure 05/06/10  . UTI (lower urinary tract infection) 05/06/10  . 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 Sleep apnea 04/2010    on CPAP, "severe central sleep apnea"  . 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 Chronic pain   . Depression   . 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 Spasticity     chronic  . Diabetes mellitus without complication   . Thrombocytopenia, unspecified 07/28/2012  . Acute respiratory failure 07/27/2012  . Narcotic overdose 07/27/2012  .  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 Unspecified hypothyroidism   . Sleep apnea   . Seizures   . Arthritis     OSTEO LEFT LEG   Past Surgical History  Procedure Laterality Date  . Intrathecal pump implantation  2010    [employer]:  fentanyl, baclofen changed to morphine and baclofen on 07/2012  . Cerebral aneurysm repair  1999   Medication list is reviewed.  Social history long-standing resident of this facility. She remains a full code.  Physical exam; T 99.8 P-82 BP 132/90 weight 192 lbs Gen.; patient is bright alert and conversational Respiratory shallow, but otherwise clear entry bilaterally work of breathing is normal. Cardiac heart sounds are normal. There is no murmurs. No signs of CHF. Neurologic; dense left hemiparesis. She has a tremor of her right hand. Some minimal use of her right leg. Abdomen obese. No masses. No tenderness. Neurologic; I see note change in her status  Impression/plan #1 urosepsis with refractory hypotension  In October Do not see that she either had blood or urine cultures done. Nevertheless she has improved. She was felt to require workup as an outpatient for her nephrolithiasis, post void residual and probably a cystoscopy. She followed with [facility] on October 21. A urine culture was done, CT scan as below #2 severe long-standing neurologic damage related to bihemispheric strokes at the time of a cerebral aneurysm repair #3 thrombocytopenia  in the hospital; this has resolved #4 multiple episodes of inadvertent narcotic overdose on top of centrally mediated sleep apnea. I think she only has baclofen in her pump at this time. She seems considerably more alert       Study Result    CLINICAL DATA:  Renal calculi   EXAM: CT ABDOMEN AND PELVIS WITHOUT CONTRAST   TECHNIQUE: Multidetector CT imaging of the abdomen and pelvis was performed following the standard protocol without intravenous contrast.    COMPARISON:  None.   FINDINGS: The lung bases are clear. No pleural or pericardial effusion.   There is mild diffuse low attenuation within the liver parenchyma compatible with fatty infiltration. Gallbladder appears normal. No biliary dilatation.   Normal appearance of the pancreas. The spleen appears unremarkable. The adrenal glands are both normal.   Multiple bilateral renal calculi are identified. The largest right renal stone is in the right renal pelvis measuring 1.1 cm, image 31/series 2. There is right-sided pelvocaliectasis and proximal hydroureter. No ureteral calculi identified. There are multiple left renal calculi. The largest is in the inferior pole measuring 8 mm, image 30/ series 2. No left-sided hydronephrosis or hydroureter. The urinary bladder appears normal. No bladder calculi. Enlarged, partially calcified fibroid uterus is identified. There is a partially calcified subserosal fibroid arising from the left post for lateral uterus measuring 4.4 cm, image 63/ series 2.   Calcified atherosclerotic change involves the abdominal aorta. There is no aneurysm. No upper abdominal adenopathy. No pelvic or inguinal adenopathy identified.   The stomach appears normal. The small bowel loops have a normal course and caliber without obstruction. Moderate stool burden identified throughout the colon up to the rectum. The patient has a baclofen pump within the subcutaneous soft tissues of the left lower quadrant.   No free fluid or abnormal fluid collections identified within the abdomen or pelvis.   Review of the visualized osseous structures is significant for osteopenia and mild multilevel spondylosis.   IMPRESSION: 1. Bilateral renal calculi. There is a 1.1 cm stone within the right renal pelvis with associated pelvocaliectasis.   2. Fibroid uterus.   3. Calcified atherosclerotic disease.   4. Significant stool burden.     Electronically Signed   By:  Kerby Moors M.D.   On: 01/06/2013 11:08

## 2013-04-05 ENCOUNTER — Other Ambulatory Visit: Payer: Self-pay | Admitting: *Deleted

## 2013-04-05 MED ORDER — LORAZEPAM 2 MG/ML IJ SOLN: INTRAMUSCULAR | Status: DC

## 2013-04-19 ENCOUNTER — Other Ambulatory Visit: Payer: Self-pay | Admitting: *Deleted

## 2013-04-19 MED ORDER — ZOLPIDEM TARTRATE 5 MG PO TABS: ORAL_TABLET | ORAL | Status: DC

## 2013-04-19 NOTE — Telephone Encounter (Signed)
[facility] 

## 2013-05-31 ENCOUNTER — Non-Acute Institutional Stay (SKILLED_NURSING_FACILITY): Payer: PRIVATE HEALTH INSURANCE | Admitting: Internal Medicine

## 2013-05-31 ENCOUNTER — Encounter: Payer: Self-pay | Admitting: Internal Medicine

## 2013-05-31 DIAGNOSIS — I1310 Hypertensive heart and chronic kidney disease without heart failure, with stage 1 through stage 4 chronic kidney disease, or unspecified chronic kidney disease: Secondary | ICD-10-CM

## 2013-05-31 DIAGNOSIS — I69959 Hemiplegia and hemiparesis following unspecified cerebrovascular disease affecting unspecified side: Secondary | ICD-10-CM

## 2013-05-31 NOTE — Progress Notes (Signed)
Patient ID: [name], female   DOB: [birth year], 58 y.o.   MRN: [id number] Facility; [facility] Chief complaint; review of patient's left arm pain, routine [employer] visit for February History; this is a patient with a history of a nondominant hemisphere stroke leaving her with dense left hemiparesis. She had a baclofen pump inserted followed by a neurologist in [facility]. At one point she had both baclofen and fentanyl however she had several admissions to the hospital with narcotic induced respiratory failure and I believe currently she just has baclofen. She is being followed in the facility by her pain doctor Dr Chancy Milroy. Her major complaint is left arm pain. She is known spasticity in the left arm with increased tone. Her major complaint today is uncontrolled left arm pain which is refractory to the oxycodone that Dr. Darrow Bussing recently prescribed. X-rays of the left shoulder elbow and wrist that were ordered  only showed degenerative changes.  Past Medical History  Diagnosis Date  . Hypertension   . Seizure disorder   . Metabolic encephalopathy 40/10/6759  . Respiratory failure 05/06/10  . UTI (lower urinary tract infection) 05/06/10  . 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 Sleep apnea 04/2010    on CPAP, "severe central sleep apnea"  . 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 Chronic pain   . Depression   . 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 Spasticity     chronic  . Diabetes mellitus without complication   . Thrombocytopenia, unspecified 07/28/2012  . Acute respiratory failure 07/27/2012  . Narcotic overdose 07/27/2012  . 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 Unspecified hypothyroidism   . Sleep apnea   . Seizures   . Arthritis     OSTEO LEFT LEG   Past Surgical History  Procedure Laterality Date  . Intrathecal pump implantation  2010    [employer]:  fentanyl, baclofen changed to morphine and baclofen on 07/2012  .  Cerebral aneurysm repair  1999   Current medications are reviewed  Physical examination; Gen. the patient does not appear to be in any distress sitting up in a wheelchair Vitals she is afebrile pulse rate 70 respirations 18 post recent blood pressure 140/68 weight 194 pounds O2 sat 96% on room air Respiratory clear entry bilaterally Cardiac heart sounds are normal Neurologic dense left hemiparesis with markedly increased tone in the left arm. Most of her discomfort seems to be on flexion and extension of the elbow and is located in the dorsal left forearm  Impression/plan #1 late effect nondominant hemisphere CVA she is on both baclofen and oxycodone orally. She is also has a Lidoderm patch on the left forearm. She has a baclofen pump. #2 disabling and predominantly left upper extremity pain although she also talks about left lower extremity pain which makes me wonder whether this is a centrally mediated pain syndrome rather than spasticity or arthritis #3 obstructive sleep apnea #4 seizure history; unspecified epilepsy on Depakote.

## 2013-05-31 NOTE — Progress Notes (Signed)
Patient ID: [name], female   DOB: [birth year], 58 y.o.   MRN: [id number]  This encounter was created in error - please disregard.

## 2013-05-31 NOTE — Progress Notes (Signed)
Patient ID: [name], female   DOB: 08/30/1955, 57 y.o.   MRN: [id number]  This encounter was created in error - please disregard. 

## 2013-07-01 ENCOUNTER — Non-Acute Institutional Stay (SKILLED_NURSING_FACILITY): Payer: PRIVATE HEALTH INSURANCE | Admitting: Internal Medicine

## 2013-07-01 DIAGNOSIS — I69959 Hemiplegia and hemiparesis following unspecified cerebrovascular disease affecting unspecified side: Secondary | ICD-10-CM

## 2013-07-01 DIAGNOSIS — I1310 Hypertensive heart and chronic kidney disease without heart failure, with stage 1 through stage 4 chronic kidney disease, or unspecified chronic kidney disease: Secondary | ICD-10-CM

## 2013-07-01 NOTE — Progress Notes (Signed)
Patient ID: [name], female   DOB: 1955/07/05, 58 y.o.   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 She had a baclofen pump inserted followed by a neurologist in [facility]. At one point she had both baclofen and fentanyl however she had several admissions to the hospital with narcotic induced respiratory failure and I believe currently she just has baclofen. She is being followed in the facility by her pain doctor Dr Chancy Milroy. Her major complaint is left arm pain. She is known spasticity in the left arm with increased tone. Her major complaint today is uncontrolled left arm pain which is refractory to the oxycodone that Dr. Darrow Bussing recently prescribed. X-rays of the left shoulder elbow and wrist that were ordered  only showed degenerative changes.  Recently she was noted to be lethargic with a temperature of 101.1. Urinalysis revealed positive nitrates and trace leukocytes. Blood pressure has also been running high in the 140-160/110 range. More recently this seems to have settled down. She was put on Rocephin was felt that her hypertension might be aggravated by her UTI  S. labwork on 1/22 showed a normal CBC and differential. Comprehensive metabolic panel was reasonably normal  Past Medical History  Diagnosis Date  . Hypertension   . Seizure disorder   . Metabolic encephalopathy 14/43/1540  . Respiratory failure 05/06/10  . UTI (lower urinary tract infection) 05/06/10  . 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 Sleep apnea 04/2010    on CPAP, "severe central sleep apnea"  . 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 Chronic pain   . Depression   . 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 Spasticity     chronic  . Diabetes mellitus without complication   . Thrombocytopenia,  unspecified 07/28/2012  . Acute respiratory failure 07/27/2012  . Narcotic overdose 07/27/2012  . 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 Unspecified hypothyroidism   . Sleep apnea   . Seizures   . Arthritis     OSTEO LEFT LEG   Past Surgical History  Procedure Laterality Date  . Intrathecal pump implantation  2010    [employer]:  fentanyl, baclofen changed to morphine and baclofen on 07/2012 and more recently just on baclofen   . Cerebral aneurysm repair  1999   Current medications are reviewed Enteric-coated aspirin 81 daily Vitamin D3 3000 daily Prilosec 20 mg every morning MiraLax 17 g daily Cymbalta 60 mg daily Synthroid 100 mcg daily Trimethoprim 100 mg daily for UTI prophylaxis Norvasc 5 daily Lidoderm 5% one patch to affected area Depakote 500 twice daily Labetalol 100 mg twice a day Keppra 1500 twice a day Oxycodone 5 mg every 8 hours Zocor 20 mg daily Catapres 0.1 mg every 6 hours for blood pressure greater than 160  Physical examination; recent blood pressure at 140/96 however today 170/115 in the rt. Arm supine. P-88. Repeated 15 minutes later at 155/110 Gen. the patient does not appear to be in any distress sitting up in be Respiratory clear entry bilaterally Cardiac heart sounds are normal Abdomen; she has right upper quadrant tenderness to deep palpation. There is no clear mass Neurologic dense left hemiparesis with markedly increased tone in the left arm. Most of her discomfort seems to be on flexion  and extension of the elbow and is located in the dorsal left forearm  Impression/plan #1 late effect nondominant hemisphere CVA she is on both baclofen pump  and oxycodone orally. She is also has a Lidoderm patch on the left forearm. She has a baclofen pump. #2 disabling and predominantly left upper extremity pain although she also talks about left lower extremity pain which makes me wonder whether this is a centrally mediated  pain syndrome rather than spasticity or arthritis #3 obstructive sleep apnea #4 seizure history; unspecified epilepsy on Depakote. #5 treatment for a UTI with Rocephin and then Macrobid #6 poorly controlled hypertension don't see any recent lab work specifically her BUN and creatinine, TSH.  #7 type 2 diabetes most recent hemoglobin A1c was 5.3% in December #8 abdominal pain/tenderness to deep palpation on exam. She will need a comprehensive metabolic panel CBC and differential and probably an abdominal ultrasound. Examining her is not easy and not reproducible nevertheless she seemed to have right upper quadrant tenderness.

## 2013-07-25 ENCOUNTER — Ambulatory Visit (INDEPENDENT_AMBULATORY_CARE_PROVIDER_SITE_OTHER): Payer: PRIVATE HEALTH INSURANCE | Admitting: Urology

## 2013-07-25 DIAGNOSIS — N200 Calculus of kidney: Secondary | ICD-10-CM

## 2013-08-21 ENCOUNTER — Non-Acute Institutional Stay (SKILLED_NURSING_FACILITY): Payer: PRIVATE HEALTH INSURANCE | Admitting: Internal Medicine

## 2013-08-21 DIAGNOSIS — I1310 Hypertensive heart and chronic kidney disease without heart failure, with stage 1 through stage 4 chronic kidney disease, or unspecified chronic kidney disease: Secondary | ICD-10-CM

## 2013-08-21 DIAGNOSIS — D696 Thrombocytopenia, unspecified: Secondary | ICD-10-CM

## 2013-08-21 DIAGNOSIS — I69959 Hemiplegia and hemiparesis following unspecified cerebrovascular disease affecting unspecified side: Secondary | ICD-10-CM

## 2013-08-25 NOTE — Progress Notes (Addendum)
Patient ID: [name], female   DOB: 10/11/1955, 58 y.o.   MRN: [id number]                 PROGRESS NOTE  DATE:  08/21/2013    FACILITY: [facility]    LEVEL OF CARE:   SNF   Routine Visit   CHIEF COMPLAINT:  Review of medical issues/[facility] visit for May.    HISTORY OF PRESENT ILLNESS:  This is a patient with a history of a nondominant hemisphere stroke, leaving her with dense left hemiparesis and left hemineglect.  She has a Baclofen pump inserted and is followed by Neurology in [facility].  At one point, she had both Baclofen and Fentanyl.  However, she had several admissions to the hospital with narcotic-induced  respiratory failure and currently she just has Baclofen.    She has chronic pain syndrome, followed in the facility by Dr. Chancy Milroy.    She has had episodic recent seizures.     She was treated for a UTI last month with Rocephin.  However, it was resistant and, therefore, Macrobid.    She also has episodic thrombocytopenia which has gotten below 100,000, although she is on Depakote.  Most recent CBC on 08/04/2013 showed a platelet count of 103,000.    PAST MEDICAL HISTORY/PROBLEM LIST:  Hypertension.    Seizure disorder.    Respiratory failure, several episodes, which I think was mostly related to Fentanyl and her Baclofen pump.    Recurrent UTIs.    Sleep apnea, diagnosed in 2012.  Listed as severe central sleep apnea.    CVA with left hemiparesis.    Chronic pain syndrome.     Depression.    Thrombocytopenia.  Likely related to Depakote.    Chronic left upper extremity spasticity, now with a longstanding Baclofen pump.    Type 2 diabetes.    Hypothyroidism.    CURRENT MEDICATIONS:   Medication list is reviewed.     Ecotrin 81 q.d.    Vitamin D3, 3000 daily.    Prilosec 20 q.d.    MiraLAX 17 g q.d.    Synthroid 100 q.d.    Trimethoprim 100 q.d. for UTI prophylaxis.    Lidoderm patch 5%.    Depakote 500, 1 tablet twice daily.     Keppra 1000, 1-1/2 tablets/1500 mg b.i.d.    Zocor 20 q.d.    Cozaar 25 q.d.    Norvasc 10 q.d.    BiPAP, on at bedtime, off in the morning.    PHYSICAL EXAMINATION:   VITAL SIGNS:   TEMPERATURE:  97.1.   PULSE:  70.    RESPIRATIONS:  18.     BLOOD PRESSURE:  132/68.   WEIGHT:  189 pounds, which is a gradual decline over the course of this year.   GENERAL APPEARANCE:  The patient looks much the same as usual.  She does not appear to get out of bed that much except for doctors' appointments.  She has dense left hemiparesis with left hemineglect.   CHEST/RESPIRATORY:  Clear air entry bilaterally.   CARDIOVASCULAR:  CARDIAC:   Heart sounds are normal.   GASTROINTESTINAL:  ABDOMEN:   No tenderness.  No masses.   NEUROLOGICAL:   As above, dense left hemiparesis with increased tone in the left arm.    ASSESSMENT/PLAN:  Late-effect nondominant hemisphere CVA.  Baclofen pump, oxycodone.  She follows with Pain Management in the facility.  She has a Lidoderm patch on the left forearm.  Obstructive sleep apnea.  On BiPAP.    Seizure disorder.  On Keppra and Depakote.    Thrombocytopenia.  This is probably Depakote-induced.  There are many other choices, although she is allergic to Vimpat.  She follows with Neurology in [place].    Hypertension.  This appears to be more stable recently.  Recent adjustments to her medications.    The patient appears to be reasonably stable.  She follows with outside consultants.

## 2013-10-04 ENCOUNTER — Non-Acute Institutional Stay (SKILLED_NURSING_FACILITY): Payer: PRIVATE HEALTH INSURANCE | Admitting: Internal Medicine

## 2013-10-04 DIAGNOSIS — I1310 Hypertensive heart and chronic kidney disease without heart failure, with stage 1 through stage 4 chronic kidney disease, or unspecified chronic kidney disease: Secondary | ICD-10-CM

## 2013-10-04 DIAGNOSIS — I69959 Hemiplegia and hemiparesis following unspecified cerebrovascular disease affecting unspecified side: Secondary | ICD-10-CM

## 2013-10-04 NOTE — Progress Notes (Signed)
Patient ID: [name], female   DOB: December 01, 1955, 58 y.o.   MRN: [id number]              PROGRESS NOTE  DATE:  10/04/2013  FACILITY: [facility]    LEVEL OF CARE:   SNF   Routine Visit   CHIEF COMPLAINT:  Review of medical issues/[facility] visit for june.    HISTORY OF PRESENT ILLNESS:  This is a patient with a history of a nondominant hemisphere stroke, leaving her with dense left hemiparesis and left hemineglect.  She has a Baclofen pump inserted and is followed by Neurology in [facility].  At one point, she had both Baclofen and Fentanyl.  However, she had several admissions to the hospital with narcotic-induced  respiratory failure and currently she just has Baclofen.    She has chronic pain syndrome, followed in the facility by Dr. Chancy Milroy. She was seen in May with no adjustments.   She has had episodic recent seizures.      She also has episodic thrombocytopenia which has gotten below 100,000, although she is on Depakote.  Most recent CBC on 08/04/2013 showed a platelet count of 103,000.    PAST MEDICAL HISTORY/PROBLEM LIST:  Hypertension.    Seizure disorder.    Respiratory failure, several episodes, which I think was mostly related to Fentanyl and her Baclofen pump.    Recurrent UTIs.    Sleep apnea, diagnosed in 2012.  Listed as severe central sleep apnea.    CVA with left hemiparesis.    Chronic pain syndrome.     Depression.    Thrombocytopenia.  Likely related to Depakote.    Chronic left upper extremity spasticity, now with a longstanding Baclofen pump.    Type 2 diabetes.    Hypothyroidism.    CURRENT MEDICATIONS:   Medication list is reviewed.     Ecotrin 81 q.d.    Vitamin D3, 3000 daily.    Prilosec 20 q.d.    MiraLAX 17 g q.d.    Synthroid 100 q.d.    Trimethoprim 100 q.d. for UTI prophylaxis.    Lidoderm patch 5%.    Depakote 500, 1 tablet twice daily.    Keppra 1000, 1-1/2 tablets/1500 mg b.i.d.    Zocor 20 q.d.    Cozaar 25  q.d.    Norvasc 10 q.d.    BiPAP, on at bedtime, off in the morning.    PHYSICAL EXAMINATION:   VITAL SIGNS:   TEMPERATURE:  97.7  PULSE:  74.    RESPIRATIONS:  18.     BLOOD PRESSURE:  128/78.   WEIGHT:  189 pounds, which is a gradual decline over the course of this year.   GENERAL APPEARANCE:  The patient looks much the same as usual.  She does not appear to get out of bed that much except for doctors' appointments.  She has dense left hemiparesis with left hemineglect.   CHEST/RESPIRATORY:  Clear air entry bilaterally.   CARDIOVASCULAR:  CARDIAC:   Heart sounds are normal.   GASTROINTESTINAL:  ABDOMEN:   No tenderness.  No masses.   NEUROLOGICAL:   As above, dense left hemiparesis with increased tone in the left arm.    ASSESSMENT/PLAN:  Late-effect nondominant hemisphere CVA.  Baclofen pump, oxycodone.  She follows with Pain Management in the facility.  She has a Lidoderm patch on the left forearm.    Obstructive sleep apnea.  On BiPAP.    Seizure disorder.  On Keppra and Depakote.  Thrombocytopenia.  This is probably Depakote-induced.  There are many other choices, although she is allergic to Vimpat.  She follows with Neurology in [place].    Hypertension.  This appears to be more stable recently.  Recent adjustments to her medications.    The patient appears to be reasonably stable.  She follows with outside consultants.

## 2013-12-06 ENCOUNTER — Non-Acute Institutional Stay (SKILLED_NURSING_FACILITY): Payer: PRIVATE HEALTH INSURANCE | Admitting: Internal Medicine

## 2013-12-06 DIAGNOSIS — I69959 Hemiplegia and hemiparesis following unspecified cerebrovascular disease affecting unspecified side: Secondary | ICD-10-CM

## 2013-12-06 DIAGNOSIS — I1310 Hypertensive heart and chronic kidney disease without heart failure, with stage 1 through stage 4 chronic kidney disease, or unspecified chronic kidney disease: Secondary | ICD-10-CM

## 2013-12-06 DIAGNOSIS — R635 Abnormal weight gain: Secondary | ICD-10-CM

## 2013-12-06 NOTE — Progress Notes (Signed)
Patient ID: [name], female   DOB: 11-01-1955, 58 y.o.   MRN: [id number]              PROGRESS NOTE  DATE:  12/06/2013  FAC[facility]: [facility]    LEVEL OF CARE:   SNF   Routine Visit   CHIEF COMPLAINT:  Review of medical issues/[facility] visit     HISTORY OF PRESENT ILLNESS:  This is a patient with a history of a nondominant hemisphere stroke, leaving her with dense left hemiparesis and left hemineglect.  She has a Baclofen pump inserted and is followed by Neurology in [facility].  At one point, she had both Baclofen and Fentanyl.  However, she had several admissions to the hospital with narcotic-induced  respiratory failure and currently she just has Baclofen.    She has chronic pain syndrome, followed in the facility by Dr. Chancy Milroy. She was seen in May with no adjustments.   She has had episodic recent seizures.      She also has episodic thrombocytopenia which has gotten below 100,000, although she is on Depakote.  Most recent CBC on 10/27/2013 showed a platelet count of 136,000  Issue this month appears to been a weight gain of apparently 40 pounds. Her lab work was checked apparently all of this is stable she has not felt to have an obvious source of this.  Her chest x-ray was clear. CBC BMP and BNP were all checked as well as a TSH.  PAST MEDICAL HISTORY/PROBLEM LIST:  Hypertension.    Seizure disorder.    Respiratory failure, several episodes, which I think was mostly related to Fentanyl and her Baclofen pump.    Recurrent UTIs.    Sleep apnea, diagnosed in 2012.  Listed as severe central sleep apnea.    CVA with left hemiparesis.    Chronic pain syndrome.     Depression.    Thrombocytopenia.  Likely related to Depakote.    Chronic left upper extremity spasticity, now with a longstanding Baclofen pump.    Type 2 diabetes.    Hypothyroidism.    CURRENT MEDICATIONS:   Medication list is reviewed.     Ecotrin 81 q.d.    Vitamin D3, 3000 daily.    Prilosec  20 q.d.    MiraLAX 17 g q.d.    Synthroid 100 q.d.    Trimethoprim 100 q.d. for UTI prophylaxis.    Lidoderm patch 5%.    Depakote 500, 1 tablet twice daily.    Keppra 1000, 1-1/2 tablets/1500 mg b.i.d.    Zocor 20 q.d.    Cozaar 25 q.d.    Norvasc 10 q.d.    BiPAP, on at bedtime, off in the morning.    PHYSICAL EXAMINATION:   VITAL SIGNS:   TEMPERATURE:  97.7  PULSE:  74.    RESPIRATIONS:  18.     BLOOD PRESSURE:  128/78.   WEIGHT:  Don't have the exact weight currently  GENERAL APPEARANCE:  The patient looks much the same as usual.  She does not appear to get out of bed that much except for doctors' appointments.  She has dense left hemiparesis with left hemineglect.   CHEST/RESPIRATORY:  Clear air entry bilaterally. Minimal lower extremity  CARDIOVASCULAR:  CARDIAC:   Heart sounds are normal.   GASTROINTESTINAL:  ABDOMEN:   No tenderness.  No masses.  No shifting dullness  NEUROLOGICAL:   As above, dense left hemiparesis with increased tone in the left arm.    ASSESSMENT/PLAN:  Late-effect nondominant hemisphere CVA.  Baclofen pump, oxycodone.  She follows with Pain Management in the facility.  She has a Lidoderm patch on the left forearm.    Obstructive sleep apnea.  On BiPAP.    Seizure disorder.  On Keppra and Depakote.    Thrombocytopenia.  This is probably Depakote-induced.  There are many other choices, although she is allergic to Vimpat.  She follows with Neurology in [place].    Hypertension.  This appears to be more stable recently.  Recent adjustments to her medications.    Weight gain; she has mild lower extremity edema I certainly don't see any evidence of major fluid retention. She does not have ascites. I think this is probably a weighing air  The patient appears to be reasonably stable.  She follows with outside consultants.

## 2014-01-03 ENCOUNTER — Non-Acute Institutional Stay (SKILLED_NURSING_FACILITY): Payer: PRIVATE HEALTH INSURANCE | Admitting: Internal Medicine

## 2014-01-03 DIAGNOSIS — E785 Hyperlipidemia, unspecified: Secondary | ICD-10-CM

## 2014-01-03 DIAGNOSIS — I6990 Unspecified sequelae of unspecified cerebrovascular disease: Secondary | ICD-10-CM

## 2014-01-03 NOTE — Progress Notes (Signed)
Patient ID: [name], female   DOB: 06/14/1955, 58 y.o.   MRN: [id number]              PROGRESS NOTE  DATE:  01/03/2014  [facility]    LEVEL OF CARE:   SNF   Routine Visit   CHIEF COMPLAINT:  Review of medical issues/[facility] visit     HISTORY OF PRESENT ILLNESS:  This is a patient with a history of a nondominant hemisphere stroke, leaving her with dense left hemiparesis and left hemineglect.  She has a Baclofen pump inserted and is followed by Neurology in [facility].  At one point, she had both Baclofen and Fentanyl.  However, she had several admissions to the hospital with narcotic-induced  respiratory failure and currently she just has Baclofen.    She has chronic pain syndrome, followed in the facility by Dr. Chancy Milroy. She was seen in May with no adjustments.   She has had episodic recent seizures.      She also has episodic thrombocytopenia which has gotten below 100,000, although she is on Depakote.  Most recent CBC on 10/27/2013 showed a platelet count of 136,000  Issue this month appears to been a weight gain of apparently 40 pounds. Her lab work was checked apparently all of this is stable she has not felt to have an obvious source of this.  Her chest x-ray was clear. CBC BMP and BNP were all checked as well as a TSH.  PAST MEDICAL HISTORY/PROBLEM LIST:  Hypertension.    Seizure disorder.    Respiratory failure, several episodes, which I think was mostly related to Fentanyl and her Baclofen pump.    Recurrent UTIs.    Sleep apnea, diagnosed in 2012.  Listed as severe central sleep apnea.    CVA with left hemiparesis.    Chronic pain syndrome.     Depression.    Thrombocytopenia.  Likely related to Depakote.    Chronic left upper extremity spasticity, now with a longstanding Baclofen pump.    Type 2 diabetes.    Hypothyroidism.    CURRENT MEDICATIONS:   Medication list is reviewed.     Ecotrin 81 q.d.    Vitamin D3, 3000 daily.     Prilosec 20 q.d.    MiraLAX 17 g q.d.    Synthroid 100 q.d.    Trimethoprim 100 q.d. for UTI prophylaxis.    Lidoderm patch 5%.    Depakote 500, 1 tablet twice daily.    Keppra 1000, 1-1/2 tablets/1500 mg b.i.d.    Zocor 20 q.d.    Cozaar 25 q.d.    Norvasc 10 q.d.    BiPAP, on at bedtime, off in the morning.    PHYSICAL EXAMINATION:   VITAL SIGNS:   TEMPERATURE:  97.7  PULSE:  74.    RESPIRATIONS:  18.     BLOOD PRESSURE:  128/78.   WEIGHT:  215 pounds GENERAL APPEARANCE:  The patient looks much the same as usual.  She does not appear to get out of bed that much except for doctors' appointments.  She has dense left hemiparesis with left hemineglect.   CHEST/RESPIRATORY:  Clear air entry bilaterally. Minimal lower extremity  CARDIOVASCULAR:  CARDIAC:   Heart sounds are normal.   GASTROINTESTINAL:  ABDOMEN:   No tenderness.  No masses.  No shifting dullness  NEUROLOGICAL:   As above, dense left hemiparesis with increased tone in the left arm.    ASSESSMENT/PLAN:  Late-effect nondominant hemisphere CVA.  Baclofen pump, oxycodone.  She follows with Pain Management in the facility.  She has a Lidoderm patch on the left forearm.    Obstructive sleep apnea.  On BiPAP.    Seizure disorder.  On Keppra and Depakote.    Thrombocytopenia.  This is probably Depakote-induced.  There are many other choices, although she is allergic to Vimpat.  She follows with Neurology in [place].    Hypertension.  This appears to be more stable recently.  Recent adjustments to her medications.    Weight gain; she has mild lower extremity edema I certainly don't see any evidence of major fluid retention. She does not have ascites. I think this is probably a weighing air  Chronic pain follow with Dr Humphrey Rolls  The patient appears to be reasonably stable.  She follows with outside consultants. I see no acute issues

## 2014-01-23 ENCOUNTER — Ambulatory Visit: Payer: PRIVATE HEALTH INSURANCE | Admitting: Urology

## 2014-01-27 ENCOUNTER — Non-Acute Institutional Stay (SKILLED_NURSING_FACILITY): Payer: PRIVATE HEALTH INSURANCE | Admitting: Internal Medicine

## 2014-01-27 DIAGNOSIS — I6990 Unspecified sequelae of unspecified cerebrovascular disease: Secondary | ICD-10-CM

## 2014-01-27 DIAGNOSIS — E785 Hyperlipidemia, unspecified: Secondary | ICD-10-CM

## 2014-01-30 NOTE — Progress Notes (Signed)
Patient ID: [name], female   DOB: [birth year], 58 y.o.   MRN: [id number]              PROGRESS NOTE  DATE:  01/27/2014    [facility]    LEVEL OF CARE:   SNF   Routine Visit   CHIEF COMPLAINT:  Review of medical issues, [facility] visit.     HISTORY OF PRESENT ILLNESS:  This is a patient who has a history of nondominant hemisphere strokes leaving her with a dense left hemiparesis and left neglect.  She had a Baclofen pump and is followed by Neurology in [facility].  At one point, she had both Baclofen and Fentanyl in the pump.  However, she had several admissions to hospital with narcotic-induced  respiratory failure and eventually she was changed to just Baclofen.      She has severe chronic pain and is followed by Dr. Chancy Milroy.  She was seen in September and no changes were made.    She is also followed by the in-house Psych service and no changes were made to her medications.    She has been followed by OT and PT, recently discharged.    PAST MEDICAL HISTORY/PROBLEM LIST:  Includes:    Hypertension.    Seizure disorder.     Respiratory failure.  Mostly due to Fentanyl mixed with Baclofen in an intrathecal pump.    Recurrent UTIs.    Severe central sleep apnea.  Diagnosed in 2012.    CVA with left hemiparesis.    Chronic pain syndrome.    History of depression with anxiety.    History of thrombocytopenia.    CURRENT MEDICATIONS:  Medication list is reviewed.     Enteric-coated aspirin 81 q.d.    Vitamin D3, 3000 U daily.    Prilosec 20 q.d.    MiraLAX 17 g daily.    Synthroid 100 q.d.    Lidoderm patch 5%, 1 patch daily, antecubital area.     Cymbalta 30 q.d.    Depakote extended-release 500 b.i.d.    Keppra 1500 b.i.d.    Labetalol 200 b.i.d.    Zocor 20 q.d.    Cozaar 50 q.d.    LABORATORY DATA:  Recent lab work:    CBC with diff normal on 12/26/2013.    Basic metabolic panel on 37/85/8850:  Sodium 144, potassium 4.2, CO2 29,  BUN 17.    TSH 2.729 on 12/05/2013.    PHYSICAL EXAMINATION:   VITAL SIGNS:   TEMPERATURE:  98.1.   PULSE:  74.   RESPIRATIONS:  20.   BLOOD PRESSURE:  118/74.   WEIGHT:  215 pounds.   CHEST/RESPIRATORY:  Clear air entry bilaterally.   CARDIOVASCULAR:  CARDIAC:   Heart sounds are normal.   GASTROINTESTINAL:  ABDOMEN:   Obese.  No masses.  No tenderness.   NEUROLOGICAL:    SENSATION/STRENGTH:  Dense left hemiparesis with increased tone in her left arm and left hemineglect.    ASSESSMENT/PLAN:    Hyperlipidemia.  This is stable on Zocor.  LDL was 82 in April 2015.    Late-effect CVA on nondominant side.    Obstructive sleep apnea.  On BiPAP.    Hypertension.  This appears to be stable.    Seizure disorder.  On Keppra and Depakote.  Also stable.

## 2014-03-13 ENCOUNTER — Ambulatory Visit (INDEPENDENT_AMBULATORY_CARE_PROVIDER_SITE_OTHER): Payer: Medicare Other | Admitting: Urology

## 2014-03-13 DIAGNOSIS — N200 Calculus of kidney: Secondary | ICD-10-CM | POA: Diagnosis not present

## 2014-03-13 DIAGNOSIS — R390 Extravasation of urine: Secondary | ICD-10-CM | POA: Diagnosis not present

## 2014-04-02 ENCOUNTER — Non-Acute Institutional Stay (SKILLED_NURSING_FACILITY): Payer: Medicare Other | Admitting: Internal Medicine

## 2014-04-02 DIAGNOSIS — I6990 Unspecified sequelae of unspecified cerebrovascular disease: Secondary | ICD-10-CM

## 2014-04-02 DIAGNOSIS — N200 Calculus of kidney: Secondary | ICD-10-CM

## 2014-04-02 DIAGNOSIS — E785 Hyperlipidemia, unspecified: Secondary | ICD-10-CM

## 2014-04-05 NOTE — Progress Notes (Addendum)
Patient ID: [name], female   DOB: [birth year], 59 y.o.   MRN: [id number]              PROGRESS NOTE  DATE:  04/02/2014                FACILITY: [facility]        LEVEL OF CARE:   SNF   Routine Visit   CHIEF COMPLAINT:  Review of medical issues/[employer] visit.    HISTORY OF PRESENT ILLNESS:  This is a patient who had a nondominant hemisphere stroke, leaving her with a dense left hemiparesis and left neglect.  She had a Baclofen pump placed and was followed by Neurology in [facility].    At one point, she had both Baclofen and Fentanyl in the pump.  However, she had several admissions to hospital with narcotic-induced  respiratory failure and, as far as I am aware, this currently just has Baclofen.  She follows with Dr. Chancy Milroy of Pain Management in the facility.    She has not had any recent complaints.    She was put on Celebrex for left wrist pain by Neurology.    At one point, she was followed by the Psychiatry service in-house, as well.    PAST MEDICAL HISTORY/PROBLEM LIST:             Hypertension.    Seizure disorder.    Respiratory failure, mostly due to Fentanyl which was mixed in an intrathecal pump.    Recurrent UTIs.    Severe central sleep apnea.    Remote right CVA with left hemiparesis.    Chronic pain syndrome.     History of depression with anxiety.    History of thrombocytopenia.    CURRENT MEDICATIONS:   Medication list is reviewed.     ASA 81 q.d.    Vitamin D3, 3000 daily.    Prilosec 20 q.d.    MiraLAX 17 g daily.    Synthroid 100 q.d.    Lidoderm patch.    Catapres 0.1 every 6 hours as needed for blood pressure over 160.    Cymbalta 30 q.d.    Oxycodone 5 mg in the morning.     Depakote 500 b.i.d.     Keppra 1500 twice a day.    Labetalol 200 b.i.d.    Celebrex 200 mg twice daily.    Zocor 20 mg at bedtime.    Cozaar 50 mg at bedtime.    LABORATORY DATA:  Labs from 02/27/2014 revealed:    Normal CBC other than a  platelet count of 133,000.    Hemoglobin A1c 5.5.    25-hydroxy vitamin D level 61.3.    Valproic acid level 95.    REVIEW OF SYSTEMS:   CHEST/RESPIRATORY:  No shortness of breath.  CARDIAC:   No chest pain.    GI:  No abdominal pain.   MUSCULOSKELETAL:  She complains about left wrist pain, low back pain.  She says the Celebrex is very helpful for this.    PHYSICAL EXAMINATION:    VITAL SIGNS:   BLOOD PRESSURE:  140/80.   WEIGHT:  207 pounds, and this is actually better than previously.   PULSE:  72.   GENERAL APPEARANCE:  The patient looks absolutely no different.   CHEST/RESPIRATORY:  Clear air entry bilaterally.   CARDIOVASCULAR:  CARDIAC:   Heart sounds are normal.  There are no murmurs.   NEUROLOGICAL:    SENSATION/STRENGTH:  She still has a  dense left hemiparesis.    ASSESSMENT/PLAN:                             Hypertensive heart disease.  This actually has been fairly stable.    History of type 2 diabetes, although she is not currently on any medications for this.    Seizure disorder.  No recent seizures recorded.    Late-effect nondominant hemisphere CVA.    Renal calculus.  Follows with Urology.

## 2014-05-05 ENCOUNTER — Non-Acute Institutional Stay (SKILLED_NURSING_FACILITY): Payer: Medicare Other | Admitting: Internal Medicine

## 2014-05-05 DIAGNOSIS — I6990 Unspecified sequelae of unspecified cerebrovascular disease: Secondary | ICD-10-CM

## 2014-05-05 DIAGNOSIS — R569 Unspecified convulsions: Secondary | ICD-10-CM | POA: Diagnosis not present

## 2014-05-05 NOTE — Progress Notes (Signed)
Patient ID: [name], female   DOB: 1955-07-15, 59 y.o.   MRN: [id number]               PROGRESS NOTE  DATE:  05/07/2014                FACILITY: [facility]        LEVEL OF CARE:   SNF   Routine Visit   CHIEF COMPLAINT:  Review of medical issues/[employer] visit.    HISTORY OF PRESENT ILLNESS:  This is a patient who had a nondominant hemisphere stroke, leaving her with a dense left hemiparesis and left neglect.  She had a Baclofen pump placed and was followed by Neurology in [facility].    At one point, she had both Baclofen and Fentanyl in the pump.  However, she had several admissions to hospital with narcotic-induced  respiratory failure and, as far as I am aware, this currently just has Baclofen.  She follows with Dr. Chancy Milroy of Pain Management in the facility.    She has not had any recent complaints.    She was put on Celebrex for left wrist pain by Neurology.    She was recently followed by psychiatry in the facility and no changes were made to her medical regimen  The patient spends most of her time in bed although apparently is up according the staff with wheelchair episodically.  PAST MEDICAL HISTORY/PROBLEM LIST:             Hypertension.    Seizure disorder.    Respiratory failure, mostly due to Fentanyl which was mixed in an intrathecal pump.    Recurrent UTIs.    Severe central sleep apnea.    Remote right CVA with left hemiparesis.    Chronic pain syndrome.     History of depression with anxiety.    History of thrombocytopenia.    CURRENT MEDICATIONS:   Medication list is reviewed.     ASA 81 q.d.    Prilosec 20 q.d.    MiraLAX 17 g daily.    Synthroid 100 q.d.    Lidoderm patch.    Catapres 0.1 every 6 hours as needed for blood pressure over 160.    Cymbalta 30 q.d.    Oxycodone 5 mg in the morning.     Depakote 500 b.i.d.     Keppra 1500 twice a day.    Labetalol 200 b.i.d.    Celebrex 200 mg twice daily.    Zocor 20 mg at  bedtime.    Cozaar 50 mg at bedtime.    LABORATORY DATA:  Labs from 02/27/2014 revealed:    Normal CBC other than a platelet count of 133,000.    Hemoglobin A1c 5.5.    25-hydroxy vitamin D level 61.3.    Valproic acid level 95.    Social; her advanced care plan was updated this month by the [facility] nurse practitioner. She is a full code and wishes all aggressive care   REVIEW OF SYSTEMS:   CHEST/RESPIRATORY:  No shortness of breath.  CARDIAC:   No chest pain.    GI:  No abdominal pain.   MUSCULOSKELETAL:  She complains about left wrist pain, low back pain.  She is currently complaining of pain in the left foot Neurologic; notes at [facility] that and in her right hand that is been there for months  PHYSICAL EXAMINATION:    VITAL SIGNS:   BLOOD PRESSURE:  140/74   WEIGHT:  210 pounds, and  this is actually better than previously.   PULSE:  72.   GENERAL APPEARANCE:  The patient looks absolutely no different.   CHEST/RESPIRATORY:  Clear air entry bilaterally.   CARDIOVASCULAR:  CARDIAC:   Heart sounds are normal.  There are no murmurs.   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 There is swelling diffusely. Her peripheral pulses are palpable in the foot NEUROLOGICAL:    SENSATION/STRENGTH:  She still has a dense left hemiparesis.Marland Kitchen Her right foot and ankle are in a dorsi flexed posture. I note that she also seems to have mostly a resting but also in extension tremor of her right hand. There is increased tone compatible with cogwheeling  ASSESSMENT/PLAN:                             Hypertensive heart disease.  This actually has been fairly stable.    History of type 2 diabetes, although she is not currently on any medications for this.    Seizure disorder.  No recent seizures recorded.    Late-effect nondominant hemisphere CVA.    Renal calculus.  Follows with Urology.    Question parkinsonism involving the right arm. I  will need to follow this  Pain in the left foot tip. She sees her neurologist in [facility] he is apparently done x-rays and perhaps it was him was started the Celebrex. I'll see how we follow-up on this later in the week

## 2014-06-27 ENCOUNTER — Non-Acute Institutional Stay (SKILLED_NURSING_FACILITY): Payer: Medicare Other | Admitting: Internal Medicine

## 2014-06-27 DIAGNOSIS — R569 Unspecified convulsions: Secondary | ICD-10-CM

## 2014-06-27 DIAGNOSIS — I10 Essential (primary) hypertension: Secondary | ICD-10-CM

## 2014-06-27 DIAGNOSIS — N200 Calculus of kidney: Secondary | ICD-10-CM | POA: Diagnosis not present

## 2014-07-02 NOTE — Progress Notes (Addendum)
Patient ID: [name], female   DOB: 23-Feb-1956, 60 y.o.   MRN: [id number]                PROGRESS NOTE  DATE:  06/27/2014          FACILITY: [facility]                   LEVEL OF CARE:   SNF   Routine Visit                       CHIEF COMPLAINT:  Routine medical visit/[facility] visit.              HISTORY OF PRESENT ILLNESS:  This is a patient who had a non-dominant hemisphere stroke, leaving her with dense left hemiparesis and left neglect.     She had a Baclofen pump placed and was followed by Neurology in [facility].  She has been getting Botox, as far as I understand.  At one point, she had both Baclofen and Fentanyl in the pump.  However, she had several admissions to hospital with narcotic-induced respiratory failure and encephalopathy.  Currently, she just has Baclofen.    She tells me today that she is concerned about recent high blood pressure readings.  She was started on clonidine 0.1 t.i.d. over the phone.    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    The patient spends most of her time in bed, although apparently up episodically.    CURRENT MEDICATIONS:  Medication list is reviewed.                Ecotrin 81 q.d.       Prilosec 20 q.d.        MiraLAX 17 g daily.    Synthroid 100 q.d.      Lidoderm patch.    Cymbalta 30 q.d.      Roxicodone 5 mg every morning.    Depakote 500 twice daily.    Keppra 1500 twice daily.    Labetalol 200 b.i.d.       Celebrex 20 q.d.       Zocor 20 q.d.         Cozaar 50 q.d.       LABORATORY DATA:   Last lab work from 06/19/2014 showed a normal CBC and differential with a white count of 6.5, hemoglobin 11.2, platelet count at 163.    Her comprehensive metabolic panel was also quite normal with a sodium of 144, potassium 4.2.  Estimated GFR was 103.    LDL was 118.    Hemoglobin A1c was 5.9.    TSH was 1.9.        REVIEW OF SYSTEMS:    CHEST/RESPIRATORY:  No shortness of  breath.     CARDIAC:  No chest pain.    GI:  No abdominal pain.     PHYSICAL EXAMINATION:   VITAL SIGNS:   BLOOD PRESSURE:  Recent blood pressures have been elevated.  She was started on clonidine 0.1 t.i.d.    WEIGHT:   221 pounds.   CHEST/RESPIRATORY:  Clear air entry bilaterally.    CARDIOVASCULAR:   CARDIAC:  Heart sounds are normal.   NEUROLOGICAL:    SENSATION/STRENGTH:  Dense left hemiparesis and some left hemineglect.  She has a hand splint, which she did not have on.  I have spoken to the staff.     ASSESSMENT/PLAN:  Poorly controlled high blood pressure.  I generally do not favor clonidine in patients like this due to the risk of depression.  I would, therefore, favor Norvasc in this patient.    History of type 2 diabetes, although not on any current medications.    Seizure disorder.  No recent seizures.    Late-effect non-dominant hemisphere CVA.  This appears to be stable.    History of renal calculus.  Follows with Urology.

## 2014-08-01 ENCOUNTER — Non-Acute Institutional Stay (SKILLED_NURSING_FACILITY): Payer: Medicare Other | Admitting: Internal Medicine

## 2014-08-01 DIAGNOSIS — I10 Essential (primary) hypertension: Secondary | ICD-10-CM

## 2014-08-01 DIAGNOSIS — R569 Unspecified convulsions: Secondary | ICD-10-CM | POA: Diagnosis not present

## 2014-08-01 DIAGNOSIS — I6990 Unspecified sequelae of unspecified cerebrovascular disease: Secondary | ICD-10-CM

## 2014-08-01 DIAGNOSIS — N200 Calculus of kidney: Secondary | ICD-10-CM

## 2014-08-07 NOTE — Progress Notes (Addendum)
Patient ID: [name], female   DOB: 1955-10-24, 59 y.o.   MRN: [id number]                PROGRESS NOTE  DATE:  07/31/2014          FACILITY: [facility]                       LEVEL OF CARE:   SNF   Routine Visit            CHIEF COMPLAINT:  Routine visit/[facility] visit.       HISTORY OF PRESENT ILLNESS:  This is an unfortunate, 59 year-old woman who had a severe non-dominant hemisphere, leaving her with a dense left hemiparesis and neglect.  She had a Baclofen pump placed and is followed by Neurology in [facility].  She has also been getting Botox.  At one point, she had Fentanyl in the pump, as well.  She had several admissions to hospital with narcotic-induced respiratory failure and encephalopathy.     Her only real concern every time I visit her is her blood pressure which, by looking at the chart, appears to be under much better control.  The patient acknowledges this.    She is followed by Psychiatry for post-stroke depression.  This also seems stable.  No recent adjustments are required.    The patient spends most of her time in bed, although apparently up episodically.    CURRENT MEDICATIONS:  Medication list is reviewed.     Ecotrin 81 mg q.d.       Prilosec 20 q.d.       MiraLAX 17 g q.d.      Synthroid 100 q.d.       Selsun Blue Shampoo.      Lidocaine 5% patch to affected areas b.i.d.         Cymbalta 30 q.d.       Roxicodone 5 mg q.a.m.        Norvasc 10 q.d.        Valproic acid 500 twice daily.      Keppra 1000 mg, 1-1/2 tablets/1500 mg twice daily.     Labetalol 200 b.i.d.       Celebrex 200 b.i.d. for arthritis.      Losartan 50 q.d.        Crestor 5 q.d.       Vitamin D3, 50,000 U monthly.          LABORATORY DATA:    She has CBG surveillance.  She is listed as a diabetic, but is not on any current medication.    On 07/06/2014, she had a CBC that was normal except for a thrombocytopenia with a platelet count of 121,000.     Comprehensive metabolic panel was normal other than a glucose of 121 and a sodium of 127.  Her albumin was also 3.4.    Recent hemoglobin A1c was 5.9.    TSH was 1.9.       REVIEW OF SYSTEMS:    CHEST/RESPIRATORY:  No shortness of breath.     CARDIAC:  No chest pain.     GI:  No abdominal pain.   NEUROLOGICAL:  She is concerned about a tremor in her right arm.        PHYSICAL EXAMINATION:   VITAL SIGNS:     BLOOD PRESSURE:  As noted, under better control in the 130/80 range.    WEIGHT:  221 pounds.  This is stable.     CARDIOVASCULAR:   CARDIAC:  Heart sounds are normal.  There are no murmurs.    NEUROLOGICAL:    SENSATION/STRENGTH:  She has dense left hemiparesis, some left hemineglect.   OTHER:  She has what appears to be an essential tremor of the right arm, worse when she used her utensil to eat.  She does also have some increased tone with a knife type rigidity.  This is not the pattern of Parkinson's disease, however.      ASSESSMENT/PLAN:                              Late-effect non-dominant hemisphere CVA with really a very poor rehab outcome.    History of type 2 diabetes, although not on any current medications with good blood sugar follow-up and hemoglobin A1c within the normal range.    Hypertension.  Under better control after the addition of clonidine.    Seizure disorder.  No recent seizures.      History of renal calculus.  Follows with Urology.    Tremor and increase in tone in the right arm.  I am a little concerned about this, although the tremor mostly looks essential (with activity).   The increased tone is more of an upper motor neuron pattern.  I would like to do a better neurologic exam on her to see if there are any additional findings before considering other options such as imaging studies.

## 2014-09-01 ENCOUNTER — Non-Acute Institutional Stay (SKILLED_NURSING_FACILITY): Payer: Medicare Other | Admitting: Internal Medicine

## 2014-09-01 DIAGNOSIS — I6930 Unspecified sequelae of cerebral infarction: Secondary | ICD-10-CM

## 2014-09-01 DIAGNOSIS — I10 Essential (primary) hypertension: Secondary | ICD-10-CM

## 2014-09-01 DIAGNOSIS — R569 Unspecified convulsions: Secondary | ICD-10-CM

## 2014-09-01 DIAGNOSIS — I6990 Unspecified sequelae of unspecified cerebrovascular disease: Secondary | ICD-10-CM | POA: Diagnosis not present

## 2014-09-01 NOTE — Progress Notes (Signed)
Patient ID: [name], female   DOB: 05-Dec-1955, 60 y.o.   MRN: [id number]                PROGRESS NOTE  DATE:  09/01/2014        FACILITY: [facility]                       LEVEL OF CARE:   SNF   Routine Visit            CHIEF COMPLAINT:  Routine visit/[facility] visit.       HISTORY OF PRESENT ILLNESS:  This is an unfortunate, 59 year-old woman who had a severe non-dominant hemisphere, leaving her with a dense left hemiparesis and neglect.  She had a Baclofen pump placed and is followed by Neurology in [facility].  She has also been getting Botox.  At one point, she had Fentanyl in the pump, as well.  She had several admissions to hospital with narcotic-induced respiratory failure and encephalopathy therefore this was removed.  Her only real concern every time I visit her is her blood pressure which, by looking at the chart, appears to be under much better control.  The patient acknowledges this.    She is followed by Psychiatry for post-stroke depression.  This also seems stable.  No recent adjustments are required.  She was also seen by neurology last month. Orders came for trazodone for insomnia associated with sleep apnea  The patient spends most of her time in bed, although apparently up episodically.    CURRENT MEDICATIONS:  Medication list is reviewed.     Ecotrin 81 mg q.d.       Prilosec 20 q.d.       MiraLAX 17 g q.d.      Synthroid 100 q.d.       Selsun Blue Shampoo.      Lidocaine 5% patch to affected areas b.i.d.         Cymbalta 30 q.d.       Roxicodone 5 mg q.a.m.        Norvasc 10 q.d.        Valproic acid 500 twice daily.      Keppra 1000 mg, 1-1/2 tablets/1500 mg twice daily.     Labetalol 200 b.i.d.       Celebrex 200 b.i.d. for arthritis.      Losartan 50 q.d.        Crestor 5 q.d.       Vitamin D3, 50,000 U monthly.          LABORATORY DATA:    She has CBG surveillance.  She is listed as a diabetic, but is not on any current  medication.    On 07/06/2014, she had a CBC that was normal except for a thrombocytopenia with a platelet count of 121,000.    Comprehensive metabolic panel was normal other than a glucose of 121 and a sodium of 127.  Her albumin was also 3.4.    Recent hemoglobin A1c was 5.9.    TSH was 1.9.       REVIEW OF SYSTEMS:    CHEST/RESPIRATORY:  No shortness of breath.     CARDIAC:  No chest pain.     GI:  No abdominal pain.   NEUROLOGICAL:  She is concerned about a tremor in her right arm.        PHYSICAL EXAMINATION:  As usual looks somewhat depressed VITAL SIGNS:  BLOOD PRESSURE:  As noted, under better control in the 130/80 range.    WEIGHT:  214 pounds.    This is stable.     CARDIOVASCULAR:   CARDIAC:  Heart sounds are normal.  There are no murmurs.    NEUROLOGICAL:    SENSATION/STRENGTH:  She has dense left hemiparesis, some left hemineglect.   OTHER:  She has what appears to be an essential tremor of the right arm, worse when she used her utensil to eat.   ASSESSMENT/PLAN:                              Late-effect non-dominant hemisphere CVA with really a very poor rehab outcome.    History of type 2 diabetes, although not on any current medications with good blood sugar follow-up and hemoglobin A1c within the normal range.    Hypertension.  Under better control after the addition of clonidine.    Seizure disorder.  No recent seizures.      History of renal calculus.  Follows with Urology.    Tremor and increase in tone in the right arm.  I am a little concerned about this, although the tremor mostly looks essential (with activity).   The increased tone is more of an upper motor neuron pattern.  I would like to do a better neurologic exam on her to see if there are any additional findings before considering other options such as imaging studies.

## 2014-09-17 ENCOUNTER — Ambulatory Visit (HOSPITAL_COMMUNITY)
Admission: RE | Admit: 2014-09-17 | Discharge: 2014-09-17 | Disposition: A | Discharge status: Home / Self Care | Payer: Medicare Other | Source: Ambulatory Visit | Attending: Urology | Admitting: Urology

## 2014-09-17 ENCOUNTER — Other Ambulatory Visit: Payer: Self-pay | Admitting: Urology

## 2014-09-17 DIAGNOSIS — N390 Urinary tract infection, site not specified: Secondary | ICD-10-CM | POA: Diagnosis not present

## 2014-09-17 DIAGNOSIS — N200 Calculus of kidney: Secondary | ICD-10-CM | POA: Insufficient documentation

## 2014-09-17 DIAGNOSIS — Z8673 Personal history of transient ischemic attack (TIA), and cerebral infarction without residual deficits: Secondary | ICD-10-CM | POA: Diagnosis not present

## 2014-09-18 ENCOUNTER — Ambulatory Visit (INDEPENDENT_AMBULATORY_CARE_PROVIDER_SITE_OTHER): Payer: Medicare Other | Admitting: Urology

## 2014-09-18 DIAGNOSIS — N200 Calculus of kidney: Secondary | ICD-10-CM

## 2014-09-18 DIAGNOSIS — N390 Urinary tract infection, site not specified: Secondary | ICD-10-CM | POA: Diagnosis not present

## 2014-09-30 ENCOUNTER — Non-Acute Institutional Stay (SKILLED_NURSING_FACILITY): Payer: Medicare Other | Admitting: Internal Medicine

## 2014-09-30 DIAGNOSIS — I6990 Unspecified sequelae of unspecified cerebrovascular disease: Secondary | ICD-10-CM

## 2014-09-30 DIAGNOSIS — I10 Essential (primary) hypertension: Secondary | ICD-10-CM | POA: Diagnosis not present

## 2014-09-30 DIAGNOSIS — R569 Unspecified convulsions: Secondary | ICD-10-CM

## 2014-10-04 NOTE — Progress Notes (Signed)
Patient ID: [name], female   DOB: Apr 16, 1955, 59 y.o.   MRN: [id number]                PROGRESS NOTE  DATE:  09/30/2014           FACILITY: [facility]                     LEVEL OF CARE:   SNF   Routine Visit                CHIEF COMPLAINT:  Routine visit/[facility] visit.      HISTORY OF PRESENT ILLNESS:  This is an unfortunate, 59 year-old woman who had a severe non-dominant hemisphere stroke, leaving her with a dense left hemiparesis and neglect.     She has a Baclofen pump, followed by Neurology in [facility].  She also has been getting Botox for spasticity in her left arm.  At one point, she had Fentanyl in the pump.  She had several admissions to hospital with narcotic-induced respiratory failure and, ultimately, this was removed.    She has difficult to control hypertension, although recently that has been under better control.    She is followed by Psychiatry for poststroke depression.    The patient spends most of her time in bed, up episodically.    CURRENT MEDICATIONS:  Medication list is reviewed.             Ecotrin 81 q.d.        Prilosec 20 q.d.      MiraLAX 17 g daily.    Synthroid 100 q.d.       Lidoderm patch to the antecubital area on the left.    Cymbalta 20 q.d.      Norvasc 5 q.d.       Oxycodone 5 mg in the morning.    Valproic acid 500 twice daily.    Keppra 1500 b.i.d.      Labetalol 200 b.i.d.      Celebrex 200 twice daily.     Cozaar 50 at bedtime.     Crestor 5 q.d.      Vitamin D3, 50,000 U monthly.    REVIEW OF SYSTEMS:    CHEST/RESPIRATORY:  No shortness of breath.     CARDIAC:  No chest pain.    GI:  No abdominal pain.   NEUROLOGICAL:  Tremor in her right arm.    PHYSICAL EXAMINATION:   VITAL SIGNS:     PULSE:  78.     RESPIRATIONS:  20.      BLOOD PRESSURE:  Under much better control.     WEIGHT:  Most recent weight is 227 pounds.   This is increased versus previous months.   CHEST/RESPIRATORY:  Clear air  entry bilaterally.    CARDIOVASCULAR:   CARDIAC:  Heart sounds are normal.  There are no murmurs.    NEUROLOGICAL:   She has a dense left hemiparesis, some left hemineglect.   She also has what appears to be an essential tremor of the right hand.  I was concerned last month about increasing tone in the right arm, but I am not really appreciating that on today's visit.    Her foot on the right has a flexion contracture at the ankle.     ASSESSMENT/PLAN:                       Late-effect non-dominant hemisphere CVA.  Type 2 diabetes.  Not on any current medications.     Hypertension.  This continues to be under much better control.    Seizure disorder.  No recent seizures.    History of renal calculus.  Follows with Urology.    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  She seems to be tolerating this well.  This can worsen to the point where it is more than a cosmetic deficit.  We will have to monitor this.                  CPT CODE: 78978

## 2014-11-26 ENCOUNTER — Non-Acute Institutional Stay (SKILLED_NURSING_FACILITY): Payer: Medicare Other | Admitting: Internal Medicine

## 2014-11-26 DIAGNOSIS — I10 Essential (primary) hypertension: Secondary | ICD-10-CM | POA: Diagnosis not present

## 2014-11-26 DIAGNOSIS — E039 Hypothyroidism, unspecified: Secondary | ICD-10-CM

## 2014-11-26 DIAGNOSIS — E870 Hyperosmolality and hypernatremia: Secondary | ICD-10-CM | POA: Diagnosis not present

## 2014-11-26 DIAGNOSIS — I6990 Unspecified sequelae of unspecified cerebrovascular disease: Secondary | ICD-10-CM

## 2014-11-30 ENCOUNTER — Encounter: Payer: Self-pay | Admitting: Internal Medicine

## 2014-12-02 NOTE — Progress Notes (Addendum)
Patient ID: [name], female   DOB: Aug 23, 1955, 59 y.o.   MRN: [id number]                PROGRESS NOTE  DATE:  11/26/2014           FACILITY: [facility]             LEVEL OF CARE:   SNF   Routine Visit                   CHIEF COMPLAINT:  Review of medical issues/[facility] visit/review of [facility] records.    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    She has spasticity and has a Baclofen pump and is followed by Neurology in [facility].  She is also getting Botox for spasticity in her left arm.  At one point, she had Fentanyl in the pump along with the Baclofen.  This led to several hospitalizations with narcotic-induced respiratory failure and, ultimately, this is no longer happening.     The patient is followed by Psychiatry for post stroke depression as well as pain management for chronic pain.    PAST MEDICAL HISTORY/PROBLEM LIST:   Reviewed.       PAST SURGICAL HISTORY:   Reviewed.     SOCIAL HISTORY:         ADVANCED DIRECTIVES:  The patient is a Full Code and wishes all aggressive measures.    CURRENT MEDICATIONS:  Medication list is reviewed.       Ecotrin 81 q.d.      Prilosec 20 q.d.     MiraLAX 17 g daily.      Synthroid 100 mcg daily.     Selsun Blue shampoo.     Lidocaine patch 5%.    Cymbalta 20 q.d.       Norvasc 5 q.d.      Pataday ophthalmic.    Oxycodone 5 mg at 10 a.m.      Valproic acid 500 b.i.d.      Keppra 1500 b.i.d.      Labetalol 200 b.i.d.     Celebrex 200 b.i.d.      Cozaar 50 q.d.       Trazodone 50 q.h.s.       Crestor 5 q.d.      Vitamin D3, 50,000 U monthly.     LABORATORY DATA:    Labs from 11/19/2014:    CBC:  White count 5.7, hemoglobin 11.6, platelet count 119.  Differential count normal.    Comprehensive metabolic panel:  Glucose 448, BUN 20, creatinine 0.6, sodium 148,  potassium 5, CO2 of 19.    Albumin 3.6.    Liver function tests normal.     Labs from 10/25/2014:     Hemoglobin A1c 5.5.    Sodium 147.       REVIEW OF SYSTEMS:    GENERAL:  No change in weight is noted.   HEENT:   No headache.  No dizziness.     CHEST/RESPIRATORY:  No cough.  No sputum.    CARDIAC:  No chest pain.   GI:  No abdominal pain.   No change in bowel habits.   GU:  No dysuria.    MUSCULOSKELETAL:   She complains of pain in her left arm and her left leg diffusely, from the shoulder and hip down to the hand  and foot, respectively.  She states her arm was ultrasounded, as well as her leg, although I do not see these results.   NEUROLOGIC; No new complaints  PHYSICAL EXAMINATION:   VITAL SIGNS:     TEMPERATURE:  97.8.   PULSE:  72.    RESPIRATIONS:  18.      BLOOD PRESSURE:  134/74.   02 SATURATIONS:  96% on room air.    CHEST/RESPIRATORY:  Clear air entry bilaterally.    CARDIOVASCULAR:   CARDIAC:  Heart sounds are normal.   GASTROINTESTINAL:   ABDOMEN:  Obese.  No masses.     LIVER/SPLEEN/KIDNEYS:  No liver, no spleen.   GENITOURINARY:   BLADDER:  No suprapubic or costovertebral angle tenderness.    MUSCULOSKELETAL:    EXTREMITIES:     In the left arm and left leg, there are no active joints.   CIRCULATION:  I do not see an issue here in either arm or leg.  Although her pulses are difficult to feel in her feet because of some swelling, there is no evidence of arterial disease sufficient enough to cause this lady's symptoms.   NEUROLOGICAL:    SENSATION/STRENGTH:  She has dense hemiparesis in the left arm and left leg with some contractures; but again, this is not severe enough to account for diffuse pain.   PSYCHIATRIC:   MENTAL STATUS:  She admits to being somewhat despondent as her son has not come to see her in the last three weeks.    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  She is followed by Pain Management for this.    Thrombocytopenia.   Probably secondary to valproic acid.    Hypernatremia.  We certainly need to push fluids here.    I believe she has a history of diabetes, although her hemoglobin F3O is certainly satisfactory.     Hypothyroidism.  Last TSH was 1.9 in April of this year.  This is satisfactory.     I am going to try to discuss the pain issue with Dr. Chancy Milroy.  Otherwise, the rest of the patient's medical problems seem stable.  The hypernatremia worries me somewhat, however.

## 2015-01-28 ENCOUNTER — Non-Acute Institutional Stay (SKILLED_NURSING_FACILITY): Payer: Medicare Other | Admitting: Internal Medicine

## 2015-01-28 DIAGNOSIS — I6990 Unspecified sequelae of unspecified cerebrovascular disease: Secondary | ICD-10-CM | POA: Diagnosis not present

## 2015-01-28 DIAGNOSIS — D696 Thrombocytopenia, unspecified: Secondary | ICD-10-CM

## 2015-01-28 DIAGNOSIS — E870 Hyperosmolality and hypernatremia: Secondary | ICD-10-CM

## 2015-01-28 NOTE — Progress Notes (Signed)
Patient ID: [name], female   DOB: 09-28-1955, 59 y.o.   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    She has spasticity and has a Baclofen pump and is followed by Neurology in [facility].  She is also getting Botox for spasticity in her left arm.  At one point, she had Fentanyl in the pump along with the Baclofen.  This led to several hospitalizations with narcotic-induced respiratory failure and, ultimately, this is no longer happening.     The patient is followed by Psychiatry for post stroke depression as well as pain management for chronic pain.    The patient is due to have her medication pump replaced on 02/08/15 I been asked to provide medical clearance as well as to arrange holding her ASA and Celebrex.  Past Medical History  Diagnosis Date  . Hypertension   . Seizure disorder   . Metabolic encephalopathy 123456  . Respiratory failure 05/06/10  . UTI (lower urinary tract infection) 05/06/10  . 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 Sleep apnea 04/2010    on CPAP, "severe central sleep apnea"  . 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 Chronic pain   . Depression   . 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 Spasticity     chronic  . Diabetes mellitus without complication   . Thrombocytopenia, unspecified 07/28/2012  . Acute respiratory failure 07/27/2012  . Narcotic overdose 07/27/2012  . 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 Unspecified hypothyroidism   . Sleep apnea   . Seizures   . Arthritis     OSTEO LEFT LEG       Past Surgical History  Procedure  Laterality Date  . Intrathecal pump implantation  2010    [employer]:  fentanyl, baclofen changed to morphine and baclofen on 07/2012  . Cerebral aneurysm repair  1999     SOCIAL HISTORY:         ADVANCED DIRECTIVES:  The patient is a Full Code and wishes all aggressive measures.    CURRENT MEDICATIONS:  Medication list is reviewed.       Ecotrin 81 q.d.      Prilosec 20 q.d.     MiraLAX 17 g daily.      Synthroid 100 mcg daily.     Selsun Blue shampoo.     Lidocaine patch 5%.    Cymbalta 20 q.d.       Norvasc 5 q.d.      Pataday ophthalmic.    Oxycodone 5 mg at 10 a.m.      Valproic acid 500 b.i.d.      Keppra 1500 b.i.d.      Labetalol 200 b.i.d.     Celebrex 200 b.i.d.      Cozaar 50 q.d.  Trazodone 50 q.h.s.       Crestor 5 q.d.      Vitamin D3, 50,000 U monthly.     LABORATORY DATA:    Labs from 11/19/2014:    CBC:  White count 5.7, hemoglobin 11.6, platelet count 119.  Differential count normal.    Comprehensive metabolic panel:  Glucose 0000000, BUN 20, creatinine 0.6, sodium 148, potassium 5, CO2 of 19.    Albumin 3.6.    Liver function tests normal.     Labs from 10/25/2014:     Hemoglobin A1c 5.5.    Sodium 147.       REVIEW OF SYSTEMS:    GENERAL:  No change in weight is noted.   HEENT:   No headache.  No dizziness.     CHEST/RESPIRATORY:  No cough.  No sputum.    CARDIAC:  No chest pain.   GI:  No abdominal pain.   No change in bowel habits.   GU:  No dysuria.    MUSCULOSKELETAL:   She complains of pain in her left arm and her left leg diffusely, from the shoulder and hip down to the hand and foot, respectively.  today she is complaining of pain in her bilateral feet.  NEUROLOGIC; No new complaints  PHYSICAL EXAMINATION:   VITAL SIGNS:     TEMPERATURE:  98.2  PULSE:  76   RESPIRATIONS:  18.      BLOOD PRESSURE:  130/90 02 SATURATIONS:  96% on room air.    CHEST/RESPIRATORY:  Clear air entry bilaterally.    CARDIOVASCULAR:    CARDIAC:  Heart sounds are normal.   GASTROINTESTINAL:   ABDOMEN:  Obese.  No masses.     LIVER/SPLEEN/KIDNEYS:  No liver, no spleen.   GENITOURINARY:   BLADDER:  No suprapubic or costovertebral angle tenderness.    MUSCULOSKELETAL:    EXTREMITIES:     In the left arm and left leg, there are no active joints.  there may be some tenderness across the metatarsophalangeal joints.  CIRCULATION:  I do not see an issue here in either arm or leg.  Although her pulses are difficult to feel in her feet because of swelling, there is no evidence of arterial disease sufficient enough to cause this lady's symptoms.   NEUROLOGICAL:    SENSATION/STRENGTH:  She has dense hemiparesis in the left arm and left leg with some contractures; but again, this is not severe enough to account for diffuse pain.   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  She is followed by Pain Management for this.  I have wondered about some form of arthrtis in the MTP's bilat  Thrombocytopenia.   Probably secondary to valproic acid.  I'll recheck this next week  Hypernatremia.  We certainly need to push fluids here.  The patient actually says she is drinking a lot. Her lab work with not really support this  I believe she has a history of diabetes, although her hemoglobin 123456 is certainly satisfactory.     Hypothyroidism.  Last TSH was 1.9 in April of this year.  This is satisfactory.       I see no issues with holding the ASA and Celebrex for 7 days prior to the proposed procedure on 12/2. no major medical  contraindication exists to the proposed procedure either

## 2015-09-24 ENCOUNTER — Ambulatory Visit (INDEPENDENT_AMBULATORY_CARE_PROVIDER_SITE_OTHER): Payer: Medicare Other | Admitting: Urology

## 2015-09-24 DIAGNOSIS — N200 Calculus of kidney: Secondary | ICD-10-CM

## 2016-08-26 ENCOUNTER — Other Ambulatory Visit: Payer: Self-pay | Admitting: Urology

## 2016-08-26 DIAGNOSIS — N200 Calculus of kidney: Secondary | ICD-10-CM

## 2016-10-06 ENCOUNTER — Ambulatory Visit (INDEPENDENT_AMBULATORY_CARE_PROVIDER_SITE_OTHER): Payer: Medicare Other | Admitting: Urology

## 2016-10-06 DIAGNOSIS — N200 Calculus of kidney: Secondary | ICD-10-CM | POA: Diagnosis not present

## 2016-10-06 DIAGNOSIS — N390 Urinary tract infection, site not specified: Secondary | ICD-10-CM | POA: Diagnosis not present

## 2017-04-21 ENCOUNTER — Emergency Department (HOSPITAL_COMMUNITY): Payer: Medicare Other

## 2017-04-21 ENCOUNTER — Inpatient Hospital Stay (HOSPITAL_COMMUNITY): Payer: Medicare Other

## 2017-04-21 ENCOUNTER — Inpatient Hospital Stay (HOSPITAL_COMMUNITY)
Admission: EM | Admit: 2017-04-21 | Discharge: 2017-04-28 | DRG: 853 | Disposition: A | Discharge status: Skilled Nursing Facility | Payer: Medicare Other | Source: Skilled Nursing Facility | Attending: Family Medicine | Admitting: Family Medicine

## 2017-04-21 ENCOUNTER — Other Ambulatory Visit: Payer: Self-pay

## 2017-04-21 ENCOUNTER — Encounter (HOSPITAL_COMMUNITY): Payer: Self-pay | Admitting: Emergency Medicine

## 2017-04-21 DIAGNOSIS — E039 Hypothyroidism, unspecified: Secondary | ICD-10-CM | POA: Diagnosis present

## 2017-04-21 DIAGNOSIS — Z79890 Hormone replacement therapy (postmenopausal): Secondary | ICD-10-CM

## 2017-04-21 DIAGNOSIS — R4781 Slurred speech: Secondary | ICD-10-CM | POA: Diagnosis present

## 2017-04-21 DIAGNOSIS — F329 Major depressive disorder, single episode, unspecified: Secondary | ICD-10-CM | POA: Diagnosis present

## 2017-04-21 DIAGNOSIS — N390 Urinary tract infection, site not specified: Secondary | ICD-10-CM | POA: Diagnosis present

## 2017-04-21 DIAGNOSIS — Z7401 Bed confinement status: Secondary | ICD-10-CM

## 2017-04-21 DIAGNOSIS — I69354 Hemiplegia and hemiparesis following cerebral infarction affecting left non-dominant side: Secondary | ICD-10-CM | POA: Diagnosis not present

## 2017-04-21 DIAGNOSIS — Z885 Allergy status to narcotic agent status: Secondary | ICD-10-CM

## 2017-04-21 DIAGNOSIS — N200 Calculus of kidney: Secondary | ICD-10-CM | POA: Diagnosis present

## 2017-04-21 DIAGNOSIS — E1151 Type 2 diabetes mellitus with diabetic peripheral angiopathy without gangrene: Secondary | ICD-10-CM | POA: Diagnosis present

## 2017-04-21 DIAGNOSIS — N179 Acute kidney failure, unspecified: Secondary | ICD-10-CM | POA: Diagnosis present

## 2017-04-21 DIAGNOSIS — R809 Proteinuria, unspecified: Secondary | ICD-10-CM | POA: Diagnosis present

## 2017-04-21 DIAGNOSIS — E870 Hyperosmolality and hypernatremia: Secondary | ICD-10-CM | POA: Diagnosis present

## 2017-04-21 DIAGNOSIS — Z7982 Long term (current) use of aspirin: Secondary | ICD-10-CM

## 2017-04-21 DIAGNOSIS — I639 Cerebral infarction, unspecified: Secondary | ICD-10-CM | POA: Diagnosis present

## 2017-04-21 DIAGNOSIS — R4701 Aphasia: Secondary | ICD-10-CM | POA: Diagnosis present

## 2017-04-21 DIAGNOSIS — G9389 Other specified disorders of brain: Secondary | ICD-10-CM | POA: Diagnosis present

## 2017-04-21 DIAGNOSIS — E861 Hypovolemia: Secondary | ICD-10-CM | POA: Diagnosis present

## 2017-04-21 DIAGNOSIS — G8929 Other chronic pain: Secondary | ICD-10-CM | POA: Diagnosis present

## 2017-04-21 DIAGNOSIS — E878 Other disorders of electrolyte and fluid balance, not elsewhere classified: Secondary | ICD-10-CM | POA: Diagnosis present

## 2017-04-21 DIAGNOSIS — Z888 Allergy status to other drugs, medicaments and biological substances status: Secondary | ICD-10-CM

## 2017-04-21 DIAGNOSIS — N138 Other obstructive and reflux uropathy: Secondary | ICD-10-CM | POA: Diagnosis present

## 2017-04-21 DIAGNOSIS — G92 Toxic encephalopathy: Secondary | ICD-10-CM | POA: Diagnosis present

## 2017-04-21 DIAGNOSIS — D6859 Other primary thrombophilia: Secondary | ICD-10-CM | POA: Diagnosis present

## 2017-04-21 DIAGNOSIS — A419 Sepsis, unspecified organism: Secondary | ICD-10-CM | POA: Diagnosis not present

## 2017-04-21 DIAGNOSIS — R9401 Abnormal electroencephalogram [EEG]: Secondary | ICD-10-CM | POA: Diagnosis present

## 2017-04-21 DIAGNOSIS — R6521 Severe sepsis with septic shock: Secondary | ICD-10-CM | POA: Diagnosis present

## 2017-04-21 DIAGNOSIS — D649 Anemia, unspecified: Secondary | ICD-10-CM | POA: Diagnosis present

## 2017-04-21 DIAGNOSIS — A4101 Sepsis due to Methicillin susceptible Staphylococcus aureus: Secondary | ICD-10-CM | POA: Diagnosis not present

## 2017-04-21 DIAGNOSIS — Z1612 Extended spectrum beta lactamase (ESBL) resistance: Secondary | ICD-10-CM | POA: Diagnosis present

## 2017-04-21 DIAGNOSIS — G4731 Primary central sleep apnea: Secondary | ICD-10-CM | POA: Diagnosis not present

## 2017-04-21 DIAGNOSIS — A4151 Sepsis due to Escherichia coli [E. coli]: Principal | ICD-10-CM | POA: Diagnosis present

## 2017-04-21 DIAGNOSIS — Z87442 Personal history of urinary calculi: Secondary | ICD-10-CM

## 2017-04-21 DIAGNOSIS — Z8673 Personal history of transient ischemic attack (TIA), and cerebral infarction without residual deficits: Secondary | ICD-10-CM

## 2017-04-21 DIAGNOSIS — G4733 Obstructive sleep apnea (adult) (pediatric): Secondary | ICD-10-CM | POA: Diagnosis present

## 2017-04-21 DIAGNOSIS — D696 Thrombocytopenia, unspecified: Secondary | ICD-10-CM | POA: Diagnosis present

## 2017-04-21 DIAGNOSIS — N170 Acute kidney failure with tubular necrosis: Secondary | ICD-10-CM | POA: Diagnosis present

## 2017-04-21 DIAGNOSIS — N136 Pyonephrosis: Secondary | ICD-10-CM | POA: Diagnosis present

## 2017-04-21 DIAGNOSIS — E785 Hyperlipidemia, unspecified: Secondary | ICD-10-CM | POA: Diagnosis present

## 2017-04-21 DIAGNOSIS — G40909 Epilepsy, unspecified, not intractable, without status epilepticus: Secondary | ICD-10-CM

## 2017-04-21 DIAGNOSIS — I10 Essential (primary) hypertension: Secondary | ICD-10-CM | POA: Diagnosis present

## 2017-04-21 DIAGNOSIS — M1991 Primary osteoarthritis, unspecified site: Secondary | ICD-10-CM | POA: Diagnosis present

## 2017-04-21 DIAGNOSIS — R471 Dysarthria and anarthria: Secondary | ICD-10-CM | POA: Diagnosis present

## 2017-04-21 DIAGNOSIS — Z87440 Personal history of urinary (tract) infections: Secondary | ICD-10-CM

## 2017-04-21 DIAGNOSIS — Z79899 Other long term (current) drug therapy: Secondary | ICD-10-CM

## 2017-04-21 DIAGNOSIS — Z6839 Body mass index (BMI) 39.0-39.9, adult: Secondary | ICD-10-CM

## 2017-04-21 DIAGNOSIS — Z8679 Personal history of other diseases of the circulatory system: Secondary | ICD-10-CM

## 2017-04-21 LAB — CBG MONITORING, ED: Glucose-Capillary: 85 mg/dL (ref 65–99)

## 2017-04-21 LAB — I-STAT CHEM 8, ED
BUN: 26 mg/dL — AB (ref 6–20)
CREATININE: 2 mg/dL — AB (ref 0.44–1.00)
Calcium, Ion: 1.1 mmol/L — ABNORMAL LOW (ref 1.15–1.40)
Chloride: 109 mmol/L (ref 101–111)
GLUCOSE: 88 mg/dL (ref 65–99)
HCT: 46 % (ref 36.0–46.0)
Hemoglobin: 15.6 g/dL — ABNORMAL HIGH (ref 12.0–15.0)
POTASSIUM: 4.4 mmol/L (ref 3.5–5.1)
Sodium: 146 mmol/L — ABNORMAL HIGH (ref 135–145)
TCO2: 23 mmol/L (ref 22–32)

## 2017-04-21 LAB — DIFFERENTIAL
BASOS ABS: 0 10*3/uL (ref 0.0–0.1)
BASOS PCT: 0 %
EOS PCT: 0 %
Eosinophils Absolute: 0.1 10*3/uL (ref 0.0–0.7)
LYMPHS PCT: 14 %
Lymphs Abs: 1.7 10*3/uL (ref 0.7–4.0)
MONO ABS: 0.6 10*3/uL (ref 0.1–1.0)
Monocytes Relative: 5 %
NEUTROS ABS: 10.2 10*3/uL (ref 1.7–7.7)
Neutrophils Relative %: 81 %

## 2017-04-21 LAB — COMPREHENSIVE METABOLIC PANEL
ALBUMIN: 3.3 g/dL — AB (ref 3.5–5.0)
ALK PHOS: 66 U/L (ref 38–126)
ALT: 12 U/L — ABNORMAL LOW (ref 14–54)
ANION GAP: 18 — AB (ref 5–15)
AST: 42 U/L — ABNORMAL HIGH (ref 15–41)
BUN: 28 mg/dL — ABNORMAL HIGH (ref 6–20)
CHLORIDE: 107 mmol/L (ref 101–111)
CO2: 21 mmol/L — ABNORMAL LOW (ref 22–32)
Calcium: 9.5 mg/dL (ref 8.9–10.3)
Creatinine, Ser: 2.04 mg/dL — ABNORMAL HIGH (ref 0.44–1.00)
GFR calc non Af Amer: 25 mL/min — ABNORMAL LOW (ref >60)
GFR, EST AFRICAN AMERICAN: 29 mL/min — AB (ref >60)
GLUCOSE: 94 mg/dL (ref 65–99)
Potassium: 4.4 mmol/L (ref 3.5–5.1)
Sodium: 146 mmol/L — ABNORMAL HIGH (ref 135–145)
Total Bilirubin: 0.9 mg/dL (ref 0.3–1.2)
Total Protein: 7.3 g/dL (ref 6.5–8.1)

## 2017-04-21 LAB — CBC
HCT: 49.8 % — ABNORMAL HIGH (ref 36.0–46.0)
HEMOGLOBIN: 15.3 g/dL — AB (ref 12.0–15.0)
MCH: 29.7 pg (ref 26.0–34.0)
MCHC: 30.7 g/dL (ref 30.0–36.0)
MCV: 96.5 fL (ref 78.0–100.0)
PLATELETS: 58 10*3/uL — AB (ref 150–400)
RBC: 5.16 MIL/uL — AB (ref 3.87–5.11)
RDW: 15.6 % — ABNORMAL HIGH (ref 11.5–15.5)
WBC: 12.6 10*3/uL — ABNORMAL HIGH (ref 4.0–10.5)

## 2017-04-21 LAB — I-STAT TROPONIN, ED: Troponin i, poc: 0 ng/mL (ref 0.00–0.08)

## 2017-04-21 LAB — PROTIME-INR
INR: 1.22
PROTHROMBIN TIME: 15.3 s — AB (ref 11.4–15.2)

## 2017-04-21 LAB — ETHANOL: Alcohol, Ethyl (B): <10 mg/dL (ref <10)

## 2017-04-21 LAB — APTT: APTT: 60 s — AB (ref 24–36)

## 2017-04-21 LAB — VALPROIC ACID LEVEL: VALPROIC ACID LVL: 59 ug/mL (ref 50.0–100.0)

## 2017-04-21 MED ORDER — PANTOPRAZOLE SODIUM 40 MG PO TBEC: 40.0000 mg | DELAYED_RELEASE_TABLET | Freq: Every day | ORAL | Status: DC

## 2017-04-21 MED ORDER — DULOXETINE HCL 20 MG PO CPEP
20.0000 mg | ORAL_CAPSULE | Freq: Every day | ORAL | Status: DC
  Filled 2017-04-21: qty 1

## 2017-04-21 MED ORDER — LEVETIRACETAM IN NACL 1000 MG/100ML IV SOLN
1000.0000 mg | Freq: Once | INTRAVENOUS | Status: AC
  Administered 2017-04-21: 1000 mg via INTRAVENOUS
  Filled 2017-04-21: qty 100

## 2017-04-21 MED ORDER — DIVALPROEX SODIUM 125 MG PO DR TAB
125.0000 mg | DELAYED_RELEASE_TABLET | Freq: Two times a day (BID) | ORAL | Status: DC
  Administered 2017-04-24 – 2017-04-28 (×8): 125 mg via ORAL
  Filled 2017-04-21 (×15): qty 1

## 2017-04-21 MED ORDER — NYSTATIN 100000 UNIT/GM EX POWD
1.0000 g | Freq: Two times a day (BID) | CUTANEOUS | Status: DC
  Administered 2017-04-22 – 2017-04-28 (×12): 1 g via TOPICAL
  Filled 2017-04-21 (×2): qty 15

## 2017-04-21 MED ORDER — STROKE: EARLY STAGES OF RECOVERY BOOK
Freq: Once | Status: AC
  Administered 2017-04-21: 21:00:00
  Filled 2017-04-21: qty 1

## 2017-04-21 MED ORDER — SODIUM CHLORIDE 0.9 % IV BOLUS (SEPSIS)
500.0000 mL | Freq: Once | INTRAVENOUS | Status: AC
  Administered 2017-04-21: 500 mL via INTRAVENOUS

## 2017-04-21 MED ORDER — DULOXETINE HCL 30 MG PO CPEP: 60.0000 mg | ORAL_CAPSULE | Freq: Every day | ORAL | Status: DC

## 2017-04-21 MED ORDER — IOPAMIDOL (ISOVUE-370) INJECTION 76%
100.0000 mL | Freq: Once | INTRAVENOUS | Status: AC | PRN
  Administered 2017-04-21: 90 mL via INTRAVENOUS

## 2017-04-21 MED ORDER — LEVOTHYROXINE SODIUM 100 MCG PO TABS
100.0000 ug | ORAL_TABLET | Freq: Every day | ORAL | Status: DC
  Administered 2017-04-25 – 2017-04-28 (×4): 100 ug via ORAL
  Filled 2017-04-21 (×5): qty 1

## 2017-04-21 MED ORDER — DIVALPROEX SODIUM 250 MG PO DR TAB
500.0000 mg | DELAYED_RELEASE_TABLET | Freq: Two times a day (BID) | ORAL | Status: DC
  Administered 2017-04-24 – 2017-04-28 (×8): 500 mg via ORAL
  Filled 2017-04-21 (×8): qty 2

## 2017-04-21 MED ORDER — ACETAMINOPHEN 160 MG/5ML PO SOLN: 650.0000 mg | ORAL | Status: DC | PRN

## 2017-04-21 MED ORDER — LORATADINE 10 MG PO TABS: 10.0000 mg | ORAL_TABLET | Freq: Every day | ORAL | Status: DC

## 2017-04-21 MED ORDER — SODIUM CHLORIDE 0.9 % IV SOLN
INTRAVENOUS | Status: DC
  Administered 2017-04-21: 13:00:00 via INTRAVENOUS

## 2017-04-21 MED ORDER — NYSTATIN 100000 UNIT/GM EX CREA
1.0000 "application " | TOPICAL_CREAM | Freq: Two times a day (BID) | CUTANEOUS | Status: DC
  Administered 2017-04-22 – 2017-04-28 (×12): 1 via TOPICAL
  Filled 2017-04-21 (×3): qty 15

## 2017-04-21 MED ORDER — LEVETIRACETAM IN NACL 1000 MG/100ML IV SOLN
1000.0000 mg | Freq: Once | INTRAVENOUS | Status: AC
  Administered 2017-04-21: 1000 mg via INTRAVENOUS

## 2017-04-21 MED ORDER — LEVETIRACETAM 500 MG PO TABS
1500.0000 mg | ORAL_TABLET | Freq: Every day | ORAL | Status: DC
  Administered 2017-04-24 – 2017-04-27 (×4): 1500 mg via ORAL
  Filled 2017-04-21 (×4): qty 3

## 2017-04-21 MED ORDER — POLYETHYLENE GLYCOL 3350 17 G PO PACK
17.0000 g | PACK | Freq: Every day | ORAL | Status: DC
  Administered 2017-04-25: 17 g via ORAL
  Filled 2017-04-21 (×2): qty 1

## 2017-04-21 MED ORDER — OLOPATADINE HCL 0.1 % OP SOLN
1.0000 [drp] | Freq: Two times a day (BID) | OPHTHALMIC | Status: DC
  Administered 2017-04-22 – 2017-04-28 (×12): 1 [drp] via OPHTHALMIC
  Filled 2017-04-21 (×2): qty 5

## 2017-04-21 MED ORDER — ACETAMINOPHEN 650 MG RE SUPP
650.0000 mg | RECTAL | Status: DC | PRN
  Administered 2017-04-23 (×2): 650 mg via RECTAL
  Filled 2017-04-21 (×3): qty 1

## 2017-04-21 MED ORDER — ROSUVASTATIN CALCIUM 10 MG PO TABS
5.0000 mg | ORAL_TABLET | Freq: Every day | ORAL | Status: DC
  Filled 2017-04-21 (×2): qty 1

## 2017-04-21 MED ORDER — ACETAMINOPHEN 325 MG PO TABS
650.0000 mg | ORAL_TABLET | ORAL | Status: DC | PRN
Start: 2017-04-21 — End: 2017-04-28
  Administered 2017-04-24 – 2017-04-28 (×14): 650 mg via ORAL
  Filled 2017-04-21 (×14): qty 2

## 2017-04-21 NOTE — ED Provider Notes (Signed)
[facility] EMERGENCY DEPARTMENT Provider Note   CSN: [id number] Arrival date & time: 04/21/17  1043   An emergency department physician performed an initial assessment on this suspected stroke patient at 1040.  History   Chief Complaint Chief Complaint  Patient presents with  . Code Stroke    HPI [name] is a 62 y.o. female.  The history is provided by the EMS personnel and the nursing home. The history is limited by the condition of the patient (slurred speech).  Pt was seen at 1035. Per EMS and NH report: Pt was LKW 0830 this morning. Pt was eating a banana. Pt was checked again at 0930 and was found to have slurred speech. Pt has hx CVA with left sided hemiparesis. Code Stroke called by EMS.   Past Medical History:  Diagnosis Date  . Acute respiratory failure ([place]) 07/27/2012  . Arthritis    OSTEO LEFT LEG  . Chronic pain   . CVA (cerebral vascular accident) ([place])    left sided hemiparesis  . Depression   . Diabetes mellitus without complication ([place])   . Hemiparesis affecting left side as late effect of cerebrovascular accident ([place])   . Hypertension   . Metabolic encephalopathy 24/40/1027  . Narcotic overdose ([place]) 07/27/2012  . Respiratory failure ([place]) 05/06/10  . Seizure disorder ([place])   . Seizures ([place])   . Sleep apnea 04/2010   on CPAP, "severe central sleep apnea"  . Sleep apnea   . Spasticity    chronic  . Thrombocytopenia ([place])    related to depakote  . Thrombocytopenia, unspecified ([place]) 07/28/2012  . Unspecified hypothyroidism   . 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 UTI (lower urinary tract infection) 05/06/10    Patient Active Problem List   Diagnosis Date Noted  . HTN (hypertension) 11/21/2012  . UTI (lower urinary tract infection) 11/18/2012  . Sepsis ([place]) 11/18/2012  . Thrombocytopenia, unspecified ([place]) 07/28/2012  . Central sleep apnea 07/28/2012  . Hemiparesis affecting left side as late effect of cerebrovascular accident ([place])  07/28/2012  . Hypothyroidism 07/28/2012  . Hypercapnia 07/28/2012  . Seizure disorder ([place]) 07/28/2012  . Narcotic overdose ([place]) 07/27/2012  . Acute respiratory failure ([place]) 07/27/2012  . Morbid obesity ([place]) 07/27/2012  . History of stroke 07/27/2012  . Chronic pain disorder 07/27/2012    Past Surgical History:  Procedure Laterality Date  . CEREBRAL ANEURYSM REPAIR  1999  . INTRATHECAL PUMP IMPLANTATION  2010   [employer]:  fentanyl, baclofen changed to morphine and baclofen on 07/2012    OB History    No data available       Home Medications    Prior to Admission medications   Medication Sig Start Date End Date Taking? Authorizing Provider  Acetaminophen (TYLENOL) 167 MG/5ML LIQD Take 1,000 mg by mouth every 8 (eight) hours as needed (for pain).    [provider]  aspirin EC 81 MG tablet Take 81 mg by mouth daily.    [provider]  benzocaine (ANBESOL) 10 % mucosal gel Use as directed 1 application in the mouth or throat as needed for pain.    [provider]  bisacodyl (DULCOLAX) 10 MG suppository Place 10 mg rectally daily as needed for constipation.    [provider]  camphor-menthol Timoteo Ace) lotion Apply 1 application topically as needed for itching.    [provider]  cholecalciferol (VITAMIN D) 1000 UNITS tablet Take 3,000 Units by mouth every morning.    [provider]  cloNIDine (CATAPRES) 0.1 MG tablet Take 0.1 mg by mouth every 6 (six) hours as needed (for SBP greater than 160).    [provider]  divalproex (DEPAKOTE) 500 MG DR tablet Take 500 mg by mouth 2 (two) times daily.    [provider]  DULoxetine (CYMBALTA) 60 MG capsule Take 60 mg by mouth daily.    [provider]  Eyelid Cleansers (OCUSOFT LID SCRUB) PADS Place 1 each into both eyes every morning.    [provider]  hydrOXYzine (ATARAX/VISTARIL) 25 MG tablet Take 12.5 mg by mouth daily as needed for itching  or anxiety.    [provider]  ipratropium-albuterol (DUONEB) 0.5-2.5 (3) MG/3ML SOLN Take 3 mLs by nebulization every 6 (six) hours as needed (for shortness of breath/wheezing).    [provider]  levETIRAcetam (KEPPRA) 1000 MG tablet Take 1.5 tablets (1,500 mg total) by mouth 2 (two) times daily. 11/23/12   Delfina Redwood, MD  levothyroxine (SYNTHROID, LEVOTHROID) 100 MCG tablet Take 1 tablet (100 mcg total) by mouth daily before breakfast. 11/23/12   Delfina Redwood, MD  LORazepam (ATIVAN) 2 MG/ML injection Inject 0.55ml intramuscularly daily as needed for seizure 04/05/13   Lauree Chandler, NP  olopatadine (PATANOL) 0.1 % ophthalmic solution Place 1 drop into both eyes 2 (two) times daily as needed for allergies (every morning and as needed).     [provider]  omeprazole (PRILOSEC) 20 MG capsule Take 20 mg by mouth daily.    [provider]  oxyCODONE (OXY IR/ROXICODONE) 5 MG immediate release tablet Take 5 mg by mouth every 8 (eight) hours as needed for pain (for moderate to severe pain).    [provider]  polyethylene glycol (MIRALAX / GLYCOLAX) packet Take 17 g by mouth daily.    [provider]  selenium sulfide (SELSUN) 1 % LOTN Apply 1 application topically 3 (three) times a week.    [provider]  simvastatin (ZOCOR) 20 MG tablet Take 20 mg by mouth at bedtime.    [provider]  sodium chloride 0.9 % SOLN 50 mL with meropenem 500 MG SOLR 500 mg Inject 500 mg into the vein every 8 (eight) hours. Through 11/28/12 then stop 11/23/12   Delfina Redwood, MD  triamcinolone cream (KENALOG) 0.1 % Apply 1 application topically 2 (two) times daily as needed (to rash).    [provider]  zolpidem (AMBIEN) 5 MG tablet Take 1/2 tablet by mouth every night at bedtime as needed for sleep. May repeat dose in 30 minutes if unable to sleep 04/19/13   Lauree Chandler, NP    Family History No family  history on file.  Social History Social History   Tobacco Use  . Smoking status: Never Smoker  . Smokeless tobacco: Never Used  Substance Use Topics  . Alcohol use: No  . Drug use: No     Allergies   Codeine; Lacosamide; Morphine; and Zolpidem   Review of Systems Review of Systems  Unable to perform ROS: Other     Physical Exam Updated Vital Signs BP (!) 85/59   Pulse 89   Resp (!) 27   Wt 97.5 kg (215 lb)   SpO2 92%   BMI 39.57 kg/m    Patient Vitals for the past 24 hrs:  BP Temp Temp src Pulse Resp SpO2 Weight  04/21/17 1424 (!) 90/58 - - - - - -  04/21/17 1423 - 99.1 F (37.3 C) Rectal - - - -  04/21/17 1400 - - - 85 (!) 37 92 % -  04/21/17 1345 - - - 85 (!) 36 93 % -  04/21/17 1330 - - - 85 (!) 42 92 % -  04/21/17 1327 (!) 80/51 - - - - 92 % -  04/21/17 1230 (!) 85/59 - - - (!) 34 - -  04/21/17 1200 (!) 84/74 - - - (!) 32 - -  04/21/17 1136 - - - - - - 97.5 kg (215 lb)  04/21/17 1130 (!) 85/59 - - 89 (!) 27 92 % -     Physical Exam 1040: Physical examination:  Nursing notes reviewed; Vital signs and O2 SAT reviewed;  Constitutional: Well developed, Well nourished, In no acute distress; Head:  Normocephalic, atraumatic; Eyes: EOMI, PERRL, No scleral icterus; ENMT: Mouth and pharynx normal, Mucous membranes dry; Neck: Supple, Full range of motion, No lymphadenopathy; Cardiovascular: Regular rate and rhythm, No gallop; Respiratory: Breath sounds clear & equal bilaterally, No wheezes. Normal respiratory effort/excursion; Chest: Nontender, Movement normal; Abdomen: Soft, Nontender, Nondistended, Normal bowel sounds; Genitourinary: No CVA tenderness; Extremities: Pulses normal, No tenderness, No edema, No calf edema or asymmetry.; Neuro: Awake, alert. No facial droop. Speech slurred. +left sided hemiparesis per hx. RUE and RLE strength 4/5..; Skin: Color normal, Warm, Dry.    ED Treatments / Results  Labs (all labs ordered are listed, but only abnormal results  are displayed)   EKG  EKG Interpretation  Date/Time:  Wednesday April 21 2017 10:54:19 EST Ventricular Rate:  91 PR Interval:    QRS Duration: 92 QT Interval:  383 QTC Calculation: 472 R Axis:   -11 Text Interpretation:  Sinus rhythm Consider anterior infarct Baseline wander When compared with ECG of 11/18/2012 No significant change was found Confirmed by Francine Graven [phone]) on 04/21/2017 11:02:42 AM       Radiology   Procedures Procedures (including critical care time)  Medications Ordered in ED Medications  sodium chloride 0.9 % bolus 500 mL (not administered)  0.9 %  sodium chloride infusion (not administered)  iopamidol (ISOVUE-370) 76 % injection 100 mL (90 mLs Intravenous Contrast Given 04/21/17 1206)     Initial Impression / Assessment and Plan / ED Course  I have reviewed the triage vital signs and the nursing notes.  Pertinent labs & imaging results that were available during my care of the patient were reviewed by me and considered in my medical decision making (see chart for details).  MDM Reviewed: previous chart, nursing note and vitals Reviewed previous: labs and ECG Interpretation: labs, ECG, x-ray and CT scan Total time providing critical care: 30-74 minutes. This excludes time spent performing separately reportable procedures and services. Consults: neurology and admitting MD   CRITICAL CARE Performed by: Alfonzo Feller Total critical care time: 35 minutes Critical care time was exclusive of separately billable procedures and treating other patients. Critical care was necessary to treat or prevent imminent or life-threatening deterioration. Critical care was time spent personally by me on the following activities: development of treatment plan with patient and/or surrogate as well as nursing, discussions with consultants, evaluation of patient's response to treatment, examination of patient, obtaining history from patient or surrogate,  ordering and performing treatments and interventions, ordering and review of laboratory studies, ordering and review of radiographic studies, pulse oximetry and re-evaluation of patient's condition.   Results for orders placed or performed during the hospital encounter of 04/21/17  Ethanol  Result Value Ref Range   Alcohol, Ethyl (B) <10 <10 mg/dL  Protime-INR  Result Value Ref Range   Prothrombin Time 15.3 (H) 11.4 - 15.2 seconds   INR 1.22   APTT  Result Value Ref Range   aPTT 60 (H) 24 - 36 seconds  CBC  Result Value Ref Range   WBC 12.6 (H) 4.0 - 10.5 K/uL   RBC 5.16 (H) 3.87 - 5.11 MIL/uL   Hemoglobin 15.3 (H) 12.0 - 15.0 g/dL   HCT 49.8 (H) 36.0 - 46.0 %   MCV 96.5 78.0 - 100.0 fL   MCH 29.7 26.0 - 34.0 pg   MCHC 30.7 30.0 - 36.0 g/dL   RDW 15.6 (H) 11.5 - 15.5 %   Platelets 58 (L) 150 - 400 K/uL  Differential  Result Value Ref Range   Neutrophils Relative % 81 %   Neutro Abs 10.2 1.7 - 7.7 K/uL   Lymphocytes Relative 14 %   Lymphs Abs 1.7 0.7 - 4.0 K/uL   Monocytes Relative 5 %   Monocytes Absolute 0.6 0.1 - 1.0 K/uL   Eosinophils Relative 0 %   Eosinophils Absolute 0.1 0.0 - 0.7 K/uL   Basophils Relative 0 %   Basophils Absolute 0.0 0.0 - 0.1 K/uL   WBC Morphology ATYPICAL LYMPHOCYTES   Comprehensive metabolic panel  Result Value Ref Range   Sodium 146 (H) 135 - 145 mmol/L   Potassium 4.4 3.5 - 5.1 mmol/L   Chloride 107 101 - 111 mmol/L   CO2 21 (L) 22 - 32 mmol/L   Glucose, Bld 94 65 - 99 mg/dL   BUN 28 (H) 6 - 20 mg/dL   Creatinine, Ser 2.04 (H) 0.44 - 1.00 mg/dL   Calcium 9.5 8.9 - 10.3 mg/dL   Total Protein 7.3 6.5 - 8.1 g/dL   Albumin 3.3 (L) 3.5 - 5.0 g/dL   AST 42 (H) 15 - 41 U/L   ALT 12 (L) 14 - 54 U/L   Alkaline Phosphatase 66 38 - 126 U/L   Total Bilirubin 0.9 0.3 - 1.2 mg/dL   GFR calc non Af Amer 25 (L) >60 mL/min   GFR calc Af Amer 29 (L) >60 mL/min   Anion gap 18 (H) 5 - 15  I-Stat Chem 8, ED  (not at [facility], [facility])  Result Value Ref Range     Sodium 146 (H) 135 - 145 mmol/L   Potassium 4.4 3.5 - 5.1 mmol/L   Chloride 109 101 - 111 mmol/L   BUN 26 (H) 6 - 20 mg/dL   Creatinine, Ser 2.00 (H) 0.44 - 1.00 mg/dL   Glucose, Bld 88 65 - 99 mg/dL   Calcium, Ion 1.10 (L) 1.15 - 1.40 mmol/L   TCO2 23 22 - 32 mmol/L   Hemoglobin 15.6 (H) 12.0 - 15.0 g/dL   HCT 46.0 36.0 - 46.0 %  I-stat troponin, ED (not at [facility], [facility])  Result Value Ref Range   Troponin i, poc 0.00 0.00 - 0.08 ng/mL   Comment 3          CBG monitoring, ED  Result Value Ref Range   Glucose-Capillary 85 65 - 99 mg/dL   Ct Angio Head W Or Wo Contrast Result Date: 04/21/2017 CLINICAL DATA:  Altered mental status.  Abnormal speech. EXAM: CT ANGIOGRAPHY HEAD AND NECK CT PERFUSION BRAIN TECHNIQUE: Multidetector CT imaging of the head and neck was performed using the standard protocol during bolus administration of intravenous contrast. Multiplanar CT image reconstructions and MIPs were obtained to evaluate the vascular anatomy. Carotid stenosis measurements (when  applicable) are obtained utilizing NASCET criteria, using the distal internal carotid diameter as the denominator. Multiphase CT imaging of the brain was performed following IV bolus contrast injection. Subsequent parametric perfusion maps were calculated using RAPID software. CONTRAST:  32mL ISOVUE-370 IOPAMIDOL (ISOVUE-370) INJECTION 76% COMPARISON:  Noncontrast head CT today. No prior angiographic imaging. FINDINGS: CTA NECK FINDINGS Aortic arch: Standard 3 vessel aortic arch with mild atherosclerotic plaque. Widely patent arch vessel origins. Right carotid system: Patent without evidence of stenosis or dissection. Left carotid system: Patent without evidence of stenosis or dissection. Vertebral arteries: Patent and codominant without evidence of dissection or hemodynamically significant stenosis. Possible mild left vertebral artery origin stenosis, with motion artifact limiting assessment. Skeleton: Right anterior  pterional craniotomy. Moderate cervical disc and facet degeneration. Other neck: No mass or enlarged lymph nodes. Upper chest: Motion artifact in the lung apices with posterior right upper lobe opacity likely reflecting atelectasis. Review of the MIP images confirms the above findings CTA HEAD FINDINGS Anterior circulation: The internal carotid arteries are widely patent from skull base to carotid termini. An aneurysm clip is present in the anterior communicating region with associated streak artifact. The ACAs are patent, with the right A1 segment being hypoplastic. No recurrent/residual aneurysm is identified. The MCAs are patent without evidence of proximal branch occlusion or significant proximal stenosis. There is diffuse asymmetric attenuation of distal branch vessels throughout the right MCA territory. Posterior circulation: The intracranial vertebral arteries are widely patent to the basilar. Dominant right PICA and left AICA. Patent SCAs. The basilar artery is widely patent. There is a patent right posterior communicating artery, and the right P1 segment appears hypoplastic. The PCAs are patent without evidence of significant stenosis. No aneurysm. Venous sinuses: Patent. Anatomic variants: Hypoplastic right A1 and right P1 segments. Delayed phase: No abnormal enhancement. Review of the MIP images confirms the above findings CT Brain Perfusion Findings: There is prominent motion artifact in the mid to latter part of the acquisition. CBF (<30%) Volume: 0 mL Perfusion (Tmax>6.0s) volume: 136 mL, however this includes portions of the posterior fossa and posterior left cerebral hemisphere which are favored to be artifactual. The majority of the prolonged transit involves the right cerebral hemisphere peripheral to the large area of chronic encephalomalacia. Infarction Location: None based on a threshold of CBF <30% of the contralateral side, although there is focal less severely reduced CBF (<38%) in the high  right frontoparietal region IMPRESSION: 1. No emergent large vessel occlusion. 2. 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 Diffuse attenuation of distal right MCA branch vessels with prolonged transit throughout the right hemisphere peripheral to the encephalomalacia, likely chronic. No definite acute core infarct based on motion degraded perfusion imaging. 3. No significant proximal intracranial arterial stenosis. 4. Widely patent cervical carotid arteries. 5. 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 These results were called by telephone at the time of interpretation on 04/21/2017 at 12:36 pm to Dr. Thurnell Garbe, who verbally acknowledged these results. Electronically Signed   By: Logan Bores M.D.   On: 04/21/2017 13:09   Ct Angio Neck W Or Wo Contrast Result Date: 04/21/2017 CLINICAL DATA:  Altered mental status.  Abnormal speech. EXAM: CT ANGIOGRAPHY HEAD AND NECK CT PERFUSION BRAIN TECHNIQUE: Multidetector CT imaging of the head and neck was performed using the standard protocol during bolus administration of intravenous contrast. Multiplanar CT image reconstructions and MIPs were obtained to evaluate the vascular anatomy. Carotid stenosis measurements (  when applicable) are obtained utilizing NASCET criteria, using the distal internal carotid diameter as the denominator. Multiphase CT imaging of the brain was performed following IV bolus contrast injection. Subsequent parametric perfusion maps were calculated using RAPID software. CONTRAST:  50mL ISOVUE-370 IOPAMIDOL (ISOVUE-370) INJECTION 76% COMPARISON:  Noncontrast head CT today. No prior angiographic imaging. FINDINGS: CTA NECK FINDINGS Aortic arch: Standard 3 vessel aortic arch with mild atherosclerotic plaque. Widely patent arch vessel origins. Right carotid system: Patent without evidence of stenosis or dissection. Left carotid system: Patent without evidence of  stenosis or dissection. Vertebral arteries: Patent and codominant without evidence of dissection or hemodynamically significant stenosis. Possible mild left vertebral artery origin stenosis, with motion artifact limiting assessment. Skeleton: Right anterior pterional craniotomy. Moderate cervical disc and facet degeneration. Other neck: No mass or enlarged lymph nodes. Upper chest: Motion artifact in the lung apices with posterior right upper lobe opacity likely reflecting atelectasis. Review of the MIP images confirms the above findings CTA HEAD FINDINGS Anterior circulation: The internal carotid arteries are widely patent from skull base to carotid termini. An aneurysm clip is present in the anterior communicating region with associated streak artifact. The ACAs are patent, with the right A1 segment being hypoplastic. No recurrent/residual aneurysm is identified. The MCAs are patent without evidence of proximal branch occlusion or significant proximal stenosis. There is diffuse asymmetric attenuation of distal branch vessels throughout the right MCA territory. Posterior circulation: The intracranial vertebral arteries are widely patent to the basilar. Dominant right PICA and left AICA. Patent SCAs. The basilar artery is widely patent. There is a patent right posterior communicating artery, and the right P1 segment appears hypoplastic. The PCAs are patent without evidence of significant stenosis. No aneurysm. Venous sinuses: Patent. Anatomic variants: Hypoplastic right A1 and right P1 segments. Delayed phase: No abnormal enhancement. Review of the MIP images confirms the above findings CT Brain Perfusion Findings: There is prominent motion artifact in the mid to latter part of the acquisition. CBF (<30%) Volume: 0 mL Perfusion (Tmax>6.0s) volume: 136 mL, however this includes portions of the posterior fossa and posterior left cerebral hemisphere which are favored to be artifactual. The majority of the prolonged  transit involves the right cerebral hemisphere peripheral to the large area of chronic encephalomalacia. Infarction Location: None based on a threshold of CBF <30% of the contralateral side, although there is focal less severely reduced CBF (<38%) in the high right frontoparietal region IMPRESSION: 1. No emergent large vessel occlusion. 2. 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 Diffuse attenuation of distal right MCA branch vessels with prolonged transit throughout the right hemisphere peripheral to the encephalomalacia, likely chronic. No definite acute core infarct based on motion degraded perfusion imaging. 3. No significant proximal intracranial arterial stenosis. 4. Widely patent cervical carotid arteries. 5. 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 These results were called by telephone at the time of interpretation on 04/21/2017 at 12:36 pm to Dr. Thurnell Garbe, who verbally acknowledged these results. Electronically Signed   By: Logan Bores M.D.   On: 04/21/2017 13:09   Ct Cerebral Perfusion W Contrast Result Date: 04/21/2017 CLINICAL DATA:  Altered mental status.  Abnormal speech. EXAM: CT ANGIOGRAPHY HEAD AND NECK CT PERFUSION BRAIN TECHNIQUE: Multidetector CT imaging of the head and neck was performed using the standard protocol during bolus administration of intravenous contrast. Multiplanar CT image reconstructions and MIPs were obtained to evaluate the vascular anatomy. Carotid stenosis measurements (when  applicable) are obtained utilizing NASCET criteria, using the distal internal carotid diameter as the denominator. Multiphase CT imaging of the brain was performed following IV bolus contrast injection. Subsequent parametric perfusion maps were calculated using RAPID software. CONTRAST:  52mL ISOVUE-370 IOPAMIDOL (ISOVUE-370) INJECTION 76% COMPARISON:  Noncontrast head CT today. No prior angiographic imaging.  FINDINGS: CTA NECK FINDINGS Aortic arch: Standard 3 vessel aortic arch with mild atherosclerotic plaque. Widely patent arch vessel origins. Right carotid system: Patent without evidence of stenosis or dissection. Left carotid system: Patent without evidence of stenosis or dissection. Vertebral arteries: Patent and codominant without evidence of dissection or hemodynamically significant stenosis. Possible mild left vertebral artery origin stenosis, with motion artifact limiting assessment. Skeleton: Right anterior pterional craniotomy. Moderate cervical disc and facet degeneration. Other neck: No mass or enlarged lymph nodes. Upper chest: Motion artifact in the lung apices with posterior right upper lobe opacity likely reflecting atelectasis. Review of the MIP images confirms the above findings CTA HEAD FINDINGS Anterior circulation: The internal carotid arteries are widely patent from skull base to carotid termini. An aneurysm clip is present in the anterior communicating region with associated streak artifact. The ACAs are patent, with the right A1 segment being hypoplastic. No recurrent/residual aneurysm is identified. The MCAs are patent without evidence of proximal branch occlusion or significant proximal stenosis. There is diffuse asymmetric attenuation of distal branch vessels throughout the right MCA territory. Posterior circulation: The intracranial vertebral arteries are widely patent to the basilar. Dominant right PICA and left AICA. Patent SCAs. The basilar artery is widely patent. There is a patent right posterior communicating artery, and the right P1 segment appears hypoplastic. The PCAs are patent without evidence of significant stenosis. No aneurysm. Venous sinuses: Patent. Anatomic variants: Hypoplastic right A1 and right P1 segments. Delayed phase: No abnormal enhancement. Review of the MIP images confirms the above findings CT Brain Perfusion Findings: There is prominent motion artifact in the mid  to latter part of the acquisition. CBF (<30%) Volume: 0 mL Perfusion (Tmax>6.0s) volume: 136 mL, however this includes portions of the posterior fossa and posterior left cerebral hemisphere which are favored to be artifactual. The majority of the prolonged transit involves the right cerebral hemisphere peripheral to the large area of chronic encephalomalacia. Infarction Location: None based on a threshold of CBF <30% of the contralateral side, although there is focal less severely reduced CBF (<38%) in the high right frontoparietal region IMPRESSION: 1. No emergent large vessel occlusion. 2. 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 Diffuse attenuation of distal right MCA branch vessels with prolonged transit throughout the right hemisphere peripheral to the encephalomalacia, likely chronic. No definite acute core infarct based on motion degraded perfusion imaging. 3. No significant proximal intracranial arterial stenosis. 4. Widely patent cervical carotid arteries. 5. 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 These results were called by telephone at the time of interpretation on 04/21/2017 at 12:36 pm to Dr. Thurnell Garbe, who verbally acknowledged these results. Electronically Signed   By: Logan Bores M.D.   On: 04/21/2017 13:09   Dg Chest Port 1 View Result Date: 04/21/2017 CLINICAL DATA:  Slurred speech. EXAM: PORTABLE CHEST 1 VIEW COMPARISON:  02/06/2015 FINDINGS: The heart size and pulmonary vascularity are normal and the lungs are clear. No acute bone abnormality. IMPRESSION: No active disease. Electronically Signed   By: Lorriane Shire M.D.   On: 04/21/2017 11:42   Ct Head Code Stroke Wo Contrast  Result Date: 04/21/2017 CLINICAL DATA:  Code stroke.  Code stroke.  Abnormal speech. EXAM: CT HEAD WITHOUT CONTRAST TECHNIQUE: Contiguous axial images were obtained from the base of the skull through the vertex without  intravenous contrast. COMPARISON:  CT head without contrast 09/16/2012 FINDINGS: Brain: Remote right MCA territory encephalomalacia is stable. There is ex vacuo dilation of the right lateral ventricle. Right paraophthalmic aneurysm clip is noted. Left frontal encephalomalacia is stable. Density within the left frontal white matter is stable. 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 No acute infarct is present. Left insular ribbon is normal. No focal cortical abnormality is present. Vascular: Ventricles are of normal size. Ex vacuo dilation of the right lateral ventricle is stable. Skull: Calvarium is intact. No focal lytic or blastic lesions are present. Sinuses/Orbits: The paranasal sinuses and mastoid air cells are clear. Globes and orbits are unremarkable. ASPECTS [facility] Stroke Program Early CT Score) - Ganglionic level infarction (caudate, lentiform nuclei, internal capsule, insula, M1-M3 cortex): 7/7 - Supraganglionic infarction (M4-M6 cortex): 3/3 Total score (0-10 with 10 being normal): 10/10 IMPRESSION: 1. No acute intracranial abnormality or significant interval change. 2. 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 4. ASPECTS is 10/10 These results were called by telephone at the time of interpretation on 04/21/2017 at 10:56 am to Dr. Francine Graven , who verbally acknowledged these results. Electronically Signed   By: San Morelle M.D.   On: 04/21/2017 11:02    1335: Code Stroke called by EMS. TeleNe[facility] MD has evaluated pt: not TPA candidate, TeleNeuro MD ordered CT-A head and neck. No acute interventional findings on CT-A.  ED RN needing to take manual forearm BP's due to pt's size.  BUN/Cr elevated and BP soft; IVF boluses given. T/C to [facility] Dr. Manuella Ghazi, case discussed, including:  HPI, pertinent PM/SHx, VS/PE, dx testing, ED course and treatment:  Agreeable to admit.    Final  Clinical Impressions(s) / ED Diagnoses   Final diagnoses:  None    ED Discharge Orders    None       Francine Graven, DO 04/23/17 1300

## 2017-04-21 NOTE — ED Notes (Addendum)
Pt has an external catheter I place and has not produced any urine since she has been here per previous RN and while this RN has taken care of her. Dr, Maudie Mercury, hospitalist made aware. No orders received.

## 2017-04-21 NOTE — H&P (Signed)
History and Physical    KATORI WIRSING NLZ:[id number] DOB: [birth year] DOA: 04/21/2017  PCP: Hilbert Corrigan, MD   Patient coming from: SNF  Chief Complaint: Slurred speech  HPI: [name] is a 62 y.o. 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  She apparently was last known well at 830 this morning during which time she was eating a banana.  She was checked on again at approximately 9:30 AM of slurred speech of acute onset. She denies any headache, visual changes, numbness, or new weakness She denies any chest pain, shortness of breath She denies any nausea, vomiting, or abdominal pain   ED Course: She has undergone imaging studies to include CTA of the head and neck as well as chest x-ray and perfusion study with no significant findings noted that are acute in nature.  Tele-neurology was consulted with recommendations not to give TPA at this time and to consider possible seizure activity with initiation of Keppra bolus and further evaluation with EEG as well as inpatient neurology consultation.  Blood pressures have been difficult to measure and have remained quite soft for which IV boluses are currently underway with some improvement noted.  Laboratory data demonstrates creatinine of 2, platelets of 58,000, and WBC of 12,600.  Review of Systems: All others reviewed and are otherwise negative.  Past Medical History:  Diagnosis Date  . Acute respiratory failure ([place]) 07/27/2012  . Arthritis    OSTEO LEFT LEG  . Chronic pain   . CVA (cerebral vascular accident) ([place])    left sided hemiparesis  . Depression   . Diabetes mellitus without complication ([place])   . Hemiparesis affecting left side as late effect of  cerebrovascular accident ([place])   . Hypertension   . Metabolic encephalopathy 90/24/0973  . Narcotic overdose ([place]) 07/27/2012  . Respiratory failure ([place]) 05/06/10  . Seizure disorder ([place])   . Seizures ([place])   . Sleep apnea 04/2010   on CPAP, "severe central sleep apnea"  . Sleep apnea   . Spasticity    chronic  . Thrombocytopenia ([place])    related to depakote  . Thrombocytopenia, unspecified ([place]) 07/28/2012  . Unspecified hypothyroidism   . 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 UTI (lower urinary tract infection) 05/06/10    Past Surgical History:  Procedure Laterality Date  . CEREBRAL ANEURYSM REPAIR  1999  . INTRATHECAL PUMP IMPLANTATION  2010   [employer]:  fentanyl, baclofen changed to morphine and baclofen on 07/2012     reports that  has never smoked. she has never used smokeless tobacco. She reports that she does not drink alcohol or use drugs.  Allergies  Allergen Reactions  . Codeine Other (See Comments)    unknown  . Lacosamide Other (See Comments)    Mood changes/agitation No rx noted    . Morphine Other (See Comments)    No rx noted    . Zolpidem     No family history on file.  Prior to Admission medications   Medication Sig Start Date End Date Taking? Authorizing Provider  acetaminophen (TYLENOL) 160 MG/5ML suspension Take 960 mg by mouth every 6 (six) hours as needed for moderate pain or fever.   Yes [provider]  amLODipine (NORVASC) 5 MG tablet Take 5 mg by mouth daily.   Yes [provider]  aspirin EC 81 MG tablet Take 81 mg by mouth daily.   Yes [provider]  cloNIDine (CATAPRES) 0.1 MG tablet Take 0.1 mg by mouth every 6 (six) hours as needed (for SBP greater than 160).   Yes [provider]  divalproex (DEPAKOTE) 125 MG DR tablet Take 125 mg by mouth 2 (two) times daily.   Yes [provider]  divalproex (DEPAKOTE) 500 MG DR tablet Take 500 mg by mouth 2 (two) times daily.   Yes [provider]    DULoxetine (CYMBALTA) 20 MG capsule Take 20 mg by mouth daily.   Yes [provider]  labetalol (NORMODYNE) 200 MG tablet Take 200 mg by mouth 2 (two) times daily.   Yes [provider]  lactulose, encephalopathy, (CHRONULAC) 10 GM/15ML SOLN Take 20 g by mouth daily as needed.   Yes [provider]  levETIRAcetam (KEPPRA) 1000 MG tablet Take 1,000 mg by mouth daily.   Yes [provider]  levETIRAcetam (KEPPRA) 500 MG tablet Take 1,500 mg by mouth at bedtime.   Yes [provider]  levothyroxine (SYNTHROID, LEVOTHROID) 100 MCG tablet Take 100 mcg by mouth daily before breakfast.   Yes [provider]  lidocaine (LIDODERM) 5 % Place 1 patch onto the skin daily. Remove & Discard patch within 12 hours or as directed by MD   Yes [provider]  loratadine (CLARITIN) 10 MG tablet Take 10 mg by mouth daily.   Yes [provider]  losartan (COZAAR) 50 MG tablet Take 50 mg by mouth at bedtime.   Yes [provider]  Menthol, Topical Analgesic, 5 % GEL Apply 1 application topically 4 (four) times daily as needed.   Yes [provider]  Multiple Vitamin (MULTIVITAMIN WITH MINERALS) TABS tablet Take 1 tablet by mouth daily.   Yes [provider]  nystatin (NYSTATIN) powder Apply 1 g topically 2 (two) times daily.   Yes [provider]  nystatin cream (MYCOSTATIN) Apply 1 application topically 2 (two) times daily.   Yes [provider]  Olopatadine HCl (PATADAY) 0.2 % SOLN Place 1 drop into both eyes at bedtime.   Yes [provider]  omeprazole (PRILOSEC) 20 MG capsule Take 20 mg by mouth daily.   Yes [provider]  oxyCODONE (OXY IR/ROXICODONE) 5 MG immediate release tablet Take 5 mg by mouth every 8 (eight) hours as needed for pain (for moderate to severe pain).   Yes [provider]  Polyethyl Glycol-Propyl Glycol (SYSTANE) 0.4-0.3 % SOLN Place 1 drop into both  eyes 3 (three) times daily as needed.   Yes [provider]  polyethylene glycol (MIRALAX / GLYCOLAX) packet Take 17 g by mouth daily.   Yes [provider]  rosuvastatin (CRESTOR) 5 MG tablet Take 5 mg by mouth at bedtime.   Yes [provider]  traZODone (DESYREL) 50 MG tablet Take 50 mg by mouth at bedtime.   Yes [provider]  Vitamin D, Ergocalciferol, (DRISDOL) 50000 units CAPS capsule Take 50,000 Units by mouth every 30 (thirty) days.   Yes [provider]  DULoxetine (CYMBALTA) 60 MG capsule Take 60 mg by mouth daily.    [provider]    Physical Exam: Vitals:   04/21/17 1345 04/21/17 1400 04/21/17 1423 04/21/17 1424  BP:    (!) 90/58  Pulse: 85 85    Resp: (!) 36 (!) 37    Temp:   99.1 F (37.3  C)   TempSrc:   Rectal   SpO2: 93% 92%    Weight:        Constitutional: NAD, calm, comfortable; obese Vitals:   04/21/17 1345 04/21/17 1400 04/21/17 1423 04/21/17 1424  BP:    (!) 90/58  Pulse: 85 85    Resp: (!) 36 (!) 37    Temp:   99.1 F (37.3 C)   TempSrc:   Rectal   SpO2: 93% 92%    Weight:       Eyes: lids and conjunctivae normal ENMT: Mucous membranes are moist.  Neck: normal, supple Respiratory: clear to auscultation bilaterally. Normal respiratory effort. No accessory muscle use.  Cardiovascular: Regular rate and rhythm, no murmurs. No extremity edema. Abdomen: no tenderness, no distention. Bowel sounds positive.  Musculoskeletal:  No joint deformity upper and lower extremities.   Skin: no rashes, lesions, ulcers.   Labs on Admission: I have personally reviewed following labs and imaging studies  CBC: Recent Labs  Lab 04/21/17 1107 04/21/17 1121  WBC 12.6*  --   NEUTROABS 10.2  --   HGB 15.3* 15.6*  HCT 49.8* 46.0  MCV 96.5  --   PLT 58*  --    Basic Metabolic Panel: Recent Labs  Lab 04/21/17 1107 04/21/17 1121  NA 146* 146*  K 4.4 4.4  CL 107 109  CO2 21*  --   GLUCOSE 94 88  BUN 28*  26*  CREATININE 2.04* 2.00*  CALCIUM 9.5  --    GFR: CrCl cannot be calculated (Unknown ideal weight.). Liver Function Tests: Recent Labs  Lab 04/21/17 1107  AST 42*  ALT 12*  ALKPHOS 66  BILITOT 0.9  PROT 7.3  ALBUMIN 3.3*   No results for input(s): LIPASE, AMYLASE in the last 168 hours. No results for input(s): AMMONIA in the last 168 hours. Coagulation Profile: Recent Labs  Lab 04/21/17 1107  INR 1.22   Cardiac Enzymes: No results for input(s): CKTOTAL, CKMB, CKMBINDEX, TROPONINI in the last 168 hours. BNP (last 3 results) No results for input(s): PROBNP in the last 8760 hours. HbA1C: No results for input(s): HGBA1C in the last 72 hours. CBG: Recent Labs  Lab 04/21/17 1054  GLUCAP 85   Lipid Profile: No results for input(s): CHOL, HDL, LDLCALC, TRIG, CHOLHDL, LDLDIRECT in the last 72 hours. Thyroid Function Tests: No results for input(s): TSH, T4TOTAL, FREET4, T3FREE, THYROIDAB in the last 72 hours. Anemia Panel: No results for input(s): VITAMINB12, FOLATE, FERRITIN, TIBC, IRON, RETICCTPCT in the last 72 hours. Urine analysis:    Component Value Date/Time   COLORURINE YELLOW 11/19/2012 [place] 11/19/2012 1033   LABSPEC 1.013 11/19/2012 1033   PHURINE 5.0 11/19/2012 1033   GLUCOSEU NEGATIVE 11/19/2012 1033   HGBUR LARGE (A) 11/19/2012 1033   BILIRUBINUR NEGATIVE 11/19/2012 1033   KETONESUR NEGATIVE 11/19/2012 1033   PROTEINUR NEGATIVE 11/19/2012 1033   UROBILINOGEN 1.0 11/19/2012 1033   NITRITE NEGATIVE 11/19/2012 1033   LEUKOCYTESUR MODERATE (A) 11/19/2012 1033    Radiological Exams on Admission: Ct Angio Head W Or Wo Contrast  Result Date: 04/21/2017 CLINICAL DATA:  Altered mental status.  Abnormal speech. EXAM: CT ANGIOGRAPHY HEAD AND NECK CT PERFUSION BRAIN TECHNIQUE: Multidetector CT imaging of the head and neck was performed using the standard protocol during bolus administration of intravenous contrast. Multiplanar CT image  reconstructions and MIPs were obtained to evaluate the vascular anatomy. Carotid stenosis measurements (when applicable) are obtained utilizing NASCET criteria, using the distal internal  carotid diameter as the denominator. Multiphase CT imaging of the brain was performed following IV bolus contrast injection. Subsequent parametric perfusion maps were calculated using RAPID software. CONTRAST:  66mL ISOVUE-370 IOPAMIDOL (ISOVUE-370) INJECTION 76% COMPARISON:  Noncontrast head CT today. No prior angiographic imaging. FINDINGS: CTA NECK FINDINGS Aortic arch: Standard 3 vessel aortic arch with mild atherosclerotic plaque. Widely patent arch vessel origins. Right carotid system: Patent without evidence of stenosis or dissection. Left carotid system: Patent without evidence of stenosis or dissection. Vertebral arteries: Patent and codominant without evidence of dissection or hemodynamically significant stenosis. Possible mild left vertebral artery origin stenosis, with motion artifact limiting assessment. Skeleton: Right anterior pterional craniotomy. Moderate cervical disc and facet degeneration. Other neck: No mass or enlarged lymph nodes. Upper chest: Motion artifact in the lung apices with posterior right upper lobe opacity likely reflecting atelectasis. Review of the MIP images confirms the above findings CTA HEAD FINDINGS Anterior circulation: The internal carotid arteries are widely patent from skull base to carotid termini. An aneurysm clip is present in the anterior communicating region with associated streak artifact. The ACAs are patent, with the right A1 segment being hypoplastic. No recurrent/residual aneurysm is identified. The MCAs are patent without evidence of proximal branch occlusion or significant proximal stenosis. There is diffuse asymmetric attenuation of distal branch vessels throughout the right MCA territory. Posterior circulation: The intracranial vertebral arteries are widely patent to the  basilar. Dominant right PICA and left AICA. Patent SCAs. The basilar artery is widely patent. There is a patent right posterior communicating artery, and the right P1 segment appears hypoplastic. The PCAs are patent without evidence of significant stenosis. No aneurysm. Venous sinuses: Patent. Anatomic variants: Hypoplastic right A1 and right P1 segments. Delayed phase: No abnormal enhancement. Review of the MIP images confirms the above findings CT Brain Perfusion Findings: There is prominent motion artifact in the mid to latter part of the acquisition. CBF (<30%) Volume: 0 mL Perfusion (Tmax>6.0s) volume: 136 mL, however this includes portions of the posterior fossa and posterior left cerebral hemisphere which are favored to be artifactual. The majority of the prolonged transit involves the right cerebral hemisphere peripheral to the large area of chronic encephalomalacia. Infarction Location: None based on a threshold of CBF <30% of the contralateral side, although there is focal less severely reduced CBF (<38%) in the high right frontoparietal region IMPRESSION: 1. No emergent large vessel occlusion. 2. 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 Diffuse attenuation of distal right MCA branch vessels with prolonged transit throughout the right hemisphere peripheral to the encephalomalacia, likely chronic. No definite acute core infarct based on motion degraded perfusion imaging. 3. No significant proximal intracranial arterial stenosis. 4. Widely patent cervical carotid arteries. 5. 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 These results were called by telephone at the time of interpretation on 04/21/2017 at 12:36 pm to Dr. Thurnell Garbe, who verbally acknowledged these results. Electronically Signed   By: Logan Bores M.D.   On: 04/21/2017 13:09   Ct Angio Neck W Or Wo Contrast  Result Date: 04/21/2017 CLINICAL DATA:  Altered  mental status.  Abnormal speech. EXAM: CT ANGIOGRAPHY HEAD AND NECK CT PERFUSION BRAIN TECHNIQUE: Multidetector CT imaging of the head and neck was performed using the standard protocol during bolus administration of intravenous contrast. Multiplanar CT image reconstructions and MIPs were obtained to evaluate the vascular anatomy. Carotid stenosis measurements (when applicable) are obtained utilizing NASCET criteria, using the  distal internal carotid diameter as the denominator. Multiphase CT imaging of the brain was performed following IV bolus contrast injection. Subsequent parametric perfusion maps were calculated using RAPID software. CONTRAST:  60mL ISOVUE-370 IOPAMIDOL (ISOVUE-370) INJECTION 76% COMPARISON:  Noncontrast head CT today. No prior angiographic imaging. FINDINGS: CTA NECK FINDINGS Aortic arch: Standard 3 vessel aortic arch with mild atherosclerotic plaque. Widely patent arch vessel origins. Right carotid system: Patent without evidence of stenosis or dissection. Left carotid system: Patent without evidence of stenosis or dissection. Vertebral arteries: Patent and codominant without evidence of dissection or hemodynamically significant stenosis. Possible mild left vertebral artery origin stenosis, with motion artifact limiting assessment. Skeleton: Right anterior pterional craniotomy. Moderate cervical disc and facet degeneration. Other neck: No mass or enlarged lymph nodes. Upper chest: Motion artifact in the lung apices with posterior right upper lobe opacity likely reflecting atelectasis. Review of the MIP images confirms the above findings CTA HEAD FINDINGS Anterior circulation: The internal carotid arteries are widely patent from skull base to carotid termini. An aneurysm clip is present in the anterior communicating region with associated streak artifact. The ACAs are patent, with the right A1 segment being hypoplastic. No recurrent/residual aneurysm is identified. The MCAs are patent without  evidence of proximal branch occlusion or significant proximal stenosis. There is diffuse asymmetric attenuation of distal branch vessels throughout the right MCA territory. Posterior circulation: The intracranial vertebral arteries are widely patent to the basilar. Dominant right PICA and left AICA. Patent SCAs. The basilar artery is widely patent. There is a patent right posterior communicating artery, and the right P1 segment appears hypoplastic. The PCAs are patent without evidence of significant stenosis. No aneurysm. Venous sinuses: Patent. Anatomic variants: Hypoplastic right A1 and right P1 segments. Delayed phase: No abnormal enhancement. Review of the MIP images confirms the above findings CT Brain Perfusion Findings: There is prominent motion artifact in the mid to latter part of the acquisition. CBF (<30%) Volume: 0 mL Perfusion (Tmax>6.0s) volume: 136 mL, however this includes portions of the posterior fossa and posterior left cerebral hemisphere which are favored to be artifactual. The majority of the prolonged transit involves the right cerebral hemisphere peripheral to the large area of chronic encephalomalacia. Infarction Location: None based on a threshold of CBF <30% of the contralateral side, although there is focal less severely reduced CBF (<38%) in the high right frontoparietal region IMPRESSION: 1. No emergent large vessel occlusion. 2. 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 Diffuse attenuation of distal right MCA branch vessels with prolonged transit throughout the right hemisphere peripheral to the encephalomalacia, likely chronic. No definite acute core infarct based on motion degraded perfusion imaging. 3. No significant proximal intracranial arterial stenosis. 4. Widely patent cervical carotid arteries. 5. 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 These results were called by telephone at the time  of interpretation on 04/21/2017 at 12:36 pm to Dr. Thurnell Garbe, who verbally acknowledged these results. Electronically Signed   By: Logan Bores M.D.   On: 04/21/2017 13:09   Ct Cerebral Perfusion W Contrast  Result Date: 04/21/2017 CLINICAL DATA:  Altered mental status.  Abnormal speech. EXAM: CT ANGIOGRAPHY HEAD AND NECK CT PERFUSION BRAIN TECHNIQUE: Multidetector CT imaging of the head and neck was performed using the standard protocol during bolus administration of intravenous contrast. Multiplanar CT image reconstructions and MIPs were obtained to evaluate the vascular anatomy. Carotid stenosis measurements (when applicable) are obtained utilizing NASCET criteria, using the  distal internal carotid diameter as the denominator. Multiphase CT imaging of the brain was performed following IV bolus contrast injection. Subsequent parametric perfusion maps were calculated using RAPID software. CONTRAST:  85mL ISOVUE-370 IOPAMIDOL (ISOVUE-370) INJECTION 76% COMPARISON:  Noncontrast head CT today. No prior angiographic imaging. FINDINGS: CTA NECK FINDINGS Aortic arch: Standard 3 vessel aortic arch with mild atherosclerotic plaque. Widely patent arch vessel origins. Right carotid system: Patent without evidence of stenosis or dissection. Left carotid system: Patent without evidence of stenosis or dissection. Vertebral arteries: Patent and codominant without evidence of dissection or hemodynamically significant stenosis. Possible mild left vertebral artery origin stenosis, with motion artifact limiting assessment. Skeleton: Right anterior pterional craniotomy. Moderate cervical disc and facet degeneration. Other neck: No mass or enlarged lymph nodes. Upper chest: Motion artifact in the lung apices with posterior right upper lobe opacity likely reflecting atelectasis. Review of the MIP images confirms the above findings CTA HEAD FINDINGS Anterior circulation: The internal carotid arteries are widely patent from skull base  to carotid termini. An aneurysm clip is present in the anterior communicating region with associated streak artifact. The ACAs are patent, with the right A1 segment being hypoplastic. No recurrent/residual aneurysm is identified. The MCAs are patent without evidence of proximal branch occlusion or significant proximal stenosis. There is diffuse asymmetric attenuation of distal branch vessels throughout the right MCA territory. Posterior circulation: The intracranial vertebral arteries are widely patent to the basilar. Dominant right PICA and left AICA. Patent SCAs. The basilar artery is widely patent. There is a patent right posterior communicating artery, and the right P1 segment appears hypoplastic. The PCAs are patent without evidence of significant stenosis. No aneurysm. Venous sinuses: Patent. Anatomic variants: Hypoplastic right A1 and right P1 segments. Delayed phase: No abnormal enhancement. Review of the MIP images confirms the above findings CT Brain Perfusion Findings: There is prominent motion artifact in the mid to latter part of the acquisition. CBF (<30%) Volume: 0 mL Perfusion (Tmax>6.0s) volume: 136 mL, however this includes portions of the posterior fossa and posterior left cerebral hemisphere which are favored to be artifactual. The majority of the prolonged transit involves the right cerebral hemisphere peripheral to the large area of chronic encephalomalacia. Infarction Location: None based on a threshold of CBF <30% of the contralateral side, although there is focal less severely reduced CBF (<38%) in the high right frontoparietal region IMPRESSION: 1. No emergent large vessel occlusion. 2. 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 Diffuse attenuation of distal right MCA branch vessels with prolonged transit throughout the right hemisphere peripheral to the encephalomalacia, likely chronic. No definite acute core infarct  based on motion degraded perfusion imaging. 3. No significant proximal intracranial arterial stenosis. 4. Widely patent cervical carotid arteries. 5. 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 These results were called by telephone at the time of interpretation on 04/21/2017 at 12:36 pm to Dr. Thurnell Garbe, who verbally acknowledged these results. Electronically Signed   By: Logan Bores M.D.   On: 04/21/2017 13:09   Dg Chest Port 1 View  Result Date: 04/21/2017 CLINICAL DATA:  Slurred speech. EXAM: PORTABLE CHEST 1 VIEW COMPARISON:  02/06/2015 FINDINGS: The heart size and pulmonary vascularity are normal and the lungs are clear. No acute bone abnormality. IMPRESSION: No active disease. Electronically Signed   By: Lorriane Shire M.D.   On: 04/21/2017 11:42   Ct Head Code Stroke Wo Contrast  Result Date: 04/21/2017 CLINICAL DATA:  Code stroke.  Code stroke.  Abnormal speech. EXAM: CT HEAD WITHOUT CONTRAST TECHNIQUE: Contiguous axial images were obtained from the base of the skull through the vertex without intravenous contrast. COMPARISON:  CT head without contrast 09/16/2012 FINDINGS: Brain: Remote right MCA territory encephalomalacia is stable. There is ex vacuo dilation of the right lateral ventricle. Right paraophthalmic aneurysm clip is noted. Left frontal encephalomalacia is stable. Density within the left frontal white matter is stable. 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 No acute infarct is present. Left insular ribbon is normal. No focal cortical abnormality is present. Vascular: Ventricles are of normal size. Ex vacuo dilation of the right lateral ventricle is stable. Skull: Calvarium is intact. No focal lytic or blastic lesions are present. Sinuses/Orbits: The paranasal sinuses and mastoid air cells are clear. Globes and orbits are unremarkable. ASPECTS [facility] Stroke Program Early CT Score) - Ganglionic level infarction (caudate, lentiform  nuclei, internal capsule, insula, M1-M3 cortex): 7/7 - Supraganglionic infarction (M4-M6 cortex): 3/3 Total score (0-10 with 10 being normal): 10/10 IMPRESSION: 1. No acute intracranial abnormality or significant interval change. 2. 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 4. ASPECTS is 10/10 These results were called by telephone at the time of interpretation on 04/21/2017 at 10:56 am to Dr. Francine Graven , who verbally acknowledged these results. Electronically Signed   By: San Morelle M.D.   On: 04/21/2017 11:02    EKG: Independently reviewed. SR with rate 91bpm.  Assessment/Plan Principal Problem:   Seizure disorder (HCC) Active Problems:   Morbid obesity ([place])   History of stroke   Thrombocytopenia, unspecified ([place])   Central sleep apnea   Hypothyroidism   HTN (hypertension)   AKI (acute kidney injury) ([place])    1. Dysarthria secondary to CVA versus possible seizure disorder.  Will consult neurology to assist with further evaluation.  Additionally, I will go ahead and Keppra bolus the patient in order EEG.  Check lipids and TSH as well as swallow evaluation.  Hold off on aspirin currently due to the thrombocytopenia.  Monitor with repeat neurochecks closely in stepdown unit. Speech eval. 2. Hypotension in setting of history of resistant hypertension.  This appears to be of undetermined significance currently but seems to be responding to fluid bolus.  I will hold home blood pressure medications and monitor carefully.  May need to consider addition of Midodrine overnight as needed. 3. AKI.  We will continue fluid boluses as blood pressure response and continue aggressive IV fluid hydration.  Withhold nephrotoxic agents.  Renal ultrasound.  Urine lytes.  Repeat labs in a.m. 4. Hypothyroidism.  Continue home Synthroid. 5. Central sleep apnea.  Continue home CPAP as previously. 6. Dyslipidemia.  Recheck  lipids as well as TSH and continue current statin dose.   DVT prophylaxis: SCDs Code Status: Full Family Communication: Sister and daughter at bedside Disposition Plan:TBD Consults called:Neurology Admission status: Inpatient, SDU   Zyona Pettaway Darleen Crocker DO [facility] Pager [phone]  If 7PM-7AM, please contact night-coverage [email or web address] Password [email or web address]  04/21/2017, 4:20 PM

## 2017-04-21 NOTE — ED Triage Notes (Signed)
Per EMS patient from [facility]. Patient was last seen normal at 0830 this morning eating a banana. Was checked again at 0930 and found to have slurred speech. 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 Patient's CBG at facility was 110. Patient is diaphoretic in triage. Slurred speech noted. She is lethargic and oriented to person place, but disoriented to time.

## 2017-04-21 NOTE — Consult Note (Addendum)
[facility] TeleNeurology Consult Services  Impression:  Stroke  #1. Possible Seizure - she is taking Keppra for this possibly postictal  #2. R/O Stroke.   Not a tpa candidate due to: Paitent had a history of bleed in basal ganglia before.  ADDENDUM 1. No emergent large vessel occlusion. 2. 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 Diffuse attenuation of distal right MCA branch vessels with prolonged transit throughout the right hemisphere peripheral to the encephalomalacia, likely chronic. No definite acute core infarct based on motion degraded perfusion imaging. 3. No significant proximal intracranial arterial stenosis. 4. Widely patent cervical carotid arteries. 5. 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  #1. Asprin ok, IV fluids, DVT prophy, Swallow study.  #2. Consider bolusing with Keppra, and EEG.    Differential Diagnosis:   1. Cardioembolic stroke  2. Small vessel disease/lacune  3. Thromboembolic, artery-to-artery mechanism  4. Hypercoagulable state-related infarct  5. Transient ischemic attack  6. Thrombotic mechanism, large artery disease   Comments:   [facility] contacted: [phone] [facility] at bedside: 1056 NIHSS assessment time: same  Recommendations:  CTA H/N and Perufsion.  Inpatient neurology cosnultation Inpatient stroke evaluation as per Neurology/ Internal Medicine Discussed with ED MD Please call with questions  -----------------------------------------------------------------------------------------  CC:   History of Present Illness   Patient is a  62 y.o. that comes in with history of left basal ganglia hemorrhage, and aneursym preivously, she comes in today with dysarthria, and aphasia on aphasia, and she is sleepy. She was eating a bannana and well upon wakeup. Last known well is 830 am. She was asking questions, normally when seen by  the nurse, thereafter she was aphasic.   Past Medical History:  Diagnosis Date  . Acute respiratory failure 07/27/2012  . Arthritis    OSTEO LEFT LEG  . Chronic pain   . CVA (cerebral vascular accident)    left sided hemiparesis  . Depression   . Diabetes mellitus without complication   . 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 Hypertension   . Metabolic encephalopathy 62/69/4854  . Narcotic overdose 07/27/2012  . Respiratory failure 05/06/10  . Seizure disorder   . Seizures   . Sleep apnea 04/2010   on CPAP, "severe central sleep apnea"  . Sleep apnea   . Spasticity    chronic  . 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 Thrombocytopenia, unspecified 07/28/2012  . Unspecified hypothyroidism   . 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 UTI (lower urinary tract infection) 05/06/10     Diagnostic:  1. No acute intracranial abnormality or significant interval change. 2. 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 4. ASPECTS is 10/10   Exam: Sudden Onset Aphasia - 14 1A: Level of Consciousness - Arouses to minor stimulation 1B: Ask Month and Age - Both Questions Right 1C: 'Blink Eyes' & 'Squeeze Hands' - Performs 1 Task - 1 2: Test Horizontal Extraocular Movements - Normal 3: Test Visual Fields - No Visual Loss 4: Test Facial Palsy - Normal symmetry 5A: Test Left Arm Motor Drift - No Effort Against Gravity 3 5B: Test Right Arm Motor Drift - Some Effort Against Gravity2 6A: Test Left Leg Motor Drift - Some Effort Against Gravity2 6B: Test Right Leg Motor Drift - No Movement4 7: Test Limb Ataxia - No Ataxia 8: Test Sensation - Normal; No  sensory loss 9: Test Language/Aphasia - Mild-Moderate Aphasia: Some Obvious Changes, Without Significant Limitation2 10: Test Dysarthria - Normal 11: Test Extinction/Inattention - No abnormality       Medical Decision Making:  -  Extensive number of diagnosis or management options are considered above.   - Extensive amount of complex data reviewed.   - High risk of complication and/or morbidity or mortality are associated with differential diagnostic considerations above.  - There may be Uncertain outcome and increased probability of prolonged functional impairment or high probability of severe prolonged functional impairment associated with some of these differential diagnosis.  Medical Data Reviewed:  1.Data reviewed include clinical labs, radiology,  Medical Tests;   2.Tests results discussed w/performing or interpreting physician;   3.Obtaining/reviewing old medical records;  4.Obtaining case history from another source;  5.Independent review of image, tracing or specimen.    Patient was informed the Neurology Consult would happen via telehealth (remote video) and consented to receiving care in this manner.

## 2017-04-21 NOTE — ED Notes (Signed)
Michelle-pt's daughter- cell [phone]

## 2017-04-21 NOTE — ED Notes (Addendum)
Neurologist states patient is not a candidate for TPA due to history.  1 hour neuro checks.

## 2017-04-21 NOTE — ED Notes (Signed)
Attempted 2 in/out caths.  Unsuccessful.

## 2017-04-22 ENCOUNTER — Inpatient Hospital Stay (HOSPITAL_COMMUNITY): Payer: Medicare Other | Admitting: Anesthesiology

## 2017-04-22 ENCOUNTER — Inpatient Hospital Stay (HOSPITAL_COMMUNITY): Payer: Medicare Other

## 2017-04-22 ENCOUNTER — Encounter (HOSPITAL_COMMUNITY): Payer: Self-pay | Admitting: General Practice

## 2017-04-22 ENCOUNTER — Encounter (HOSPITAL_COMMUNITY)
Admission: EM | Disposition: A | Discharge status: Skilled Nursing Facility | Payer: Self-pay | Source: Skilled Nursing Facility | Attending: Family Medicine

## 2017-04-22 ENCOUNTER — Inpatient Hospital Stay (HOSPITAL_COMMUNITY)
Admit: 2017-04-22 | Discharge: 2017-04-22 | Disposition: A | Discharge status: Home / Self Care | Payer: Medicare Other | Attending: Internal Medicine | Admitting: Internal Medicine

## 2017-04-22 ENCOUNTER — Ambulatory Visit: Admit: 2017-04-22 | Payer: Medicare Other | Admitting: Urology

## 2017-04-22 HISTORY — PX: CYSTOSCOPY W/ URETERAL STENT PLACEMENT: SHX1429

## 2017-04-22 LAB — CBC
HCT: 43.3 % (ref 36.0–46.0)
Hemoglobin: 13.3 g/dL (ref 12.0–15.0)
MCH: 29.2 pg (ref 26.0–34.0)
MCHC: 30.7 g/dL (ref 30.0–36.0)
MCV: 95.2 fL (ref 78.0–100.0)
PLATELETS: 50 10*3/uL — AB (ref 150–400)
RBC: 4.55 MIL/uL (ref 3.87–5.11)
RDW: 16.2 % — AB (ref 11.5–15.5)
WBC: 19 10*3/uL — AB (ref 4.0–10.5)

## 2017-04-22 LAB — BASIC METABOLIC PANEL
Anion gap: 14 (ref 5–15)
Anion gap: 15 (ref 5–15)
BUN: 39 mg/dL — AB (ref 6–20)
BUN: 48 mg/dL — AB (ref 6–20)
CALCIUM: 8.4 mg/dL — AB (ref 8.9–10.3)
CHLORIDE: 112 mmol/L — AB (ref 101–111)
CO2: 24 mmol/L (ref 22–32)
CO2: 22 mmol/L (ref 22–32)
CREATININE: 2.69 mg/dL — AB (ref 0.44–1.00)
CREATININE: 2.55 mg/dL — AB (ref 0.44–1.00)
Calcium: 7.6 mg/dL — ABNORMAL LOW (ref 8.9–10.3)
Chloride: 108 mmol/L (ref 101–111)
GFR, EST AFRICAN AMERICAN: 21 mL/min — AB (ref >60)
GFR, EST AFRICAN AMERICAN: 22 mL/min — AB (ref >60)
GFR, EST NON AFRICAN AMERICAN: 18 mL/min — AB (ref >60)
GFR, EST NON AFRICAN AMERICAN: 19 mL/min — AB (ref >60)
Glucose, Bld: 69 mg/dL (ref 65–99)
Glucose, Bld: 91 mg/dL (ref 65–99)
Potassium: 4.4 mmol/L (ref 3.5–5.1)
Potassium: 4.1 mmol/L (ref 3.5–5.1)
SODIUM: 146 mmol/L — AB (ref 135–145)
SODIUM: 149 mmol/L — AB (ref 135–145)

## 2017-04-22 LAB — URINALYSIS, ROUTINE W REFLEX MICROSCOPIC
Glucose, UA: NEGATIVE mg/dL
Glucose, UA: NEGATIVE mg/dL
Ketones, ur: NEGATIVE mg/dL
NITRITE: NEGATIVE
NITRITE: POSITIVE — AB
PH: 5 (ref 5.0–8.0)
Protein, ur: 100 mg/dL — AB
Specific Gravity, Urine: 1.025 (ref 1.005–1.030)
pH: 5 (ref 5.0–8.0)

## 2017-04-22 LAB — LIPID PANEL
CHOL/HDL RATIO: 3.6 ratio
Cholesterol: 107 mg/dL (ref 0–200)
HDL: 30 mg/dL — AB (ref >40)
LDL CALC: 45 mg/dL (ref 0–99)
Triglycerides: 160 mg/dL — ABNORMAL HIGH (ref <150)
VLDL: 32 mg/dL (ref 0–40)

## 2017-04-22 LAB — SODIUM, URINE, RANDOM: Sodium, Ur: 25 mmol/L

## 2017-04-22 LAB — HIV ANTIBODY (ROUTINE TESTING W REFLEX): HIV SCREEN 4TH GENERATION: NONREACTIVE

## 2017-04-22 LAB — CBC WITH DIFFERENTIAL/PLATELET
Basophils Absolute: 0 10*3/uL (ref 0.0–0.1)
Basophils Relative: 0 %
Eosinophils Absolute: 0 10*3/uL (ref 0.0–0.7)
Eosinophils Relative: 0 %
HEMATOCRIT: 39.2 % (ref 36.0–46.0)
HEMOGLOBIN: 12.4 g/dL (ref 12.0–15.0)
LYMPHS PCT: 13 %
Lymphs Abs: 2.8 10*3/uL (ref 0.7–4.0)
MCH: 29.7 pg (ref 26.0–34.0)
MCHC: 31.6 g/dL (ref 30.0–36.0)
MCV: 93.8 fL (ref 78.0–100.0)
MONOS PCT: 9 %
Monocytes Absolute: 1.9 10*3/uL — ABNORMAL HIGH (ref 0.1–1.0)
NEUTROS ABS: 16.8 10*3/uL (ref 1.7–7.7)
NEUTROS PCT: 78 %
Platelets: 43 10*3/uL — ABNORMAL LOW (ref 150–400)
RBC: 4.18 MIL/uL (ref 3.87–5.11)
RDW: 16.4 % — ABNORMAL HIGH (ref 11.5–15.5)
WBC: 21.4 10*3/uL — ABNORMAL HIGH (ref 4.0–10.5)

## 2017-04-22 LAB — URINALYSIS, MICROSCOPIC (REFLEX)

## 2017-04-22 LAB — GLUCOSE, CAPILLARY
GLUCOSE-CAPILLARY: 98 mg/dL (ref 65–99)
Glucose-Capillary: 111 mg/dL — ABNORMAL HIGH (ref 65–99)

## 2017-04-22 LAB — RAPID URINE DRUG SCREEN, HOSP PERFORMED
AMPHETAMINES: NOT DETECTED
BARBITURATES: NOT DETECTED
Benzodiazepines: NOT DETECTED
Cocaine: NOT DETECTED
Opiates: NOT DETECTED
TETRAHYDROCANNABINOL: NOT DETECTED

## 2017-04-22 LAB — LACTIC ACID, PLASMA
LACTIC ACID, VENOUS: 2 mmol/L — AB (ref 0.5–1.9)
Lactic Acid, Venous: 1.9 mmol/L (ref 0.5–1.9)

## 2017-04-22 LAB — HEMOGLOBIN A1C
Hgb A1c MFr Bld: 6.3 % — ABNORMAL HIGH (ref 4.8–5.6)
Mean Plasma Glucose: 134.11 mg/dL

## 2017-04-22 LAB — CREATININE, URINE, RANDOM: Creatinine, Urine: 172.08 mg/dL

## 2017-04-22 SURGERY — CYSTOSCOPY, WITH RETROGRADE PYELOGRAM AND URETERAL STENT INSERTION
Anesthesia: General | Laterality: Bilateral

## 2017-04-22 MED ORDER — SODIUM CHLORIDE 0.9 % IV SOLN
INTRAVENOUS | Status: DC
  Administered 2017-04-22 (×2): via INTRAVENOUS

## 2017-04-22 MED ORDER — FENTANYL CITRATE (PF) 100 MCG/2ML IJ SOLN: 25.0000 ug | INTRAMUSCULAR | Status: DC | PRN

## 2017-04-22 MED ORDER — PIPERACILLIN-TAZOBACTAM 3.375 G IVPB
3.3750 g | Freq: Three times a day (TID) | INTRAVENOUS | Status: DC
  Administered 2017-04-22: 3.375 g via INTRAVENOUS
  Filled 2017-04-22: qty 50

## 2017-04-22 MED ORDER — SODIUM CHLORIDE 0.45 % IV SOLN
INTRAVENOUS | Status: DC
  Administered 2017-04-22: 17:00:00 via INTRAVENOUS

## 2017-04-22 MED ORDER — FENTANYL CITRATE (PF) 100 MCG/2ML IJ SOLN
12.5000 ug | INTRAMUSCULAR | Status: DC | PRN
  Administered 2017-04-22: 12.5 ug via INTRAVENOUS
  Filled 2017-04-22: qty 2

## 2017-04-22 MED ORDER — FENTANYL CITRATE (PF) 100 MCG/2ML IJ SOLN
INTRAMUSCULAR | Status: AC
  Filled 2017-04-22: qty 2

## 2017-04-22 MED ORDER — PROPOFOL 10 MG/ML IV BOLUS
INTRAVENOUS | Status: DC | PRN
  Administered 2017-04-22: 80 mg via INTRAVENOUS
  Administered 2017-04-22: 30 mg via INTRAVENOUS

## 2017-04-22 MED ORDER — LIDOCAINE 2% (20 MG/ML) 5 ML SYRINGE
INTRAMUSCULAR | Status: DC | PRN
  Administered 2017-04-22: 60 mg via INTRAVENOUS

## 2017-04-22 MED ORDER — DEXAMETHASONE SODIUM PHOSPHATE 10 MG/ML IJ SOLN
INTRAMUSCULAR | Status: AC
  Filled 2017-04-22: qty 1

## 2017-04-22 MED ORDER — SODIUM CHLORIDE 0.9 % IR SOLN
Status: DC | PRN
  Administered 2017-04-22: 3000 mL

## 2017-04-22 MED ORDER — FENTANYL CITRATE (PF) 100 MCG/2ML IJ SOLN
INTRAMUSCULAR | Status: DC | PRN
  Administered 2017-04-22: 25 ug via INTRAVENOUS

## 2017-04-22 MED ORDER — PHENYLEPHRINE HCL 10 MG/ML IJ SOLN
INTRAMUSCULAR | Status: DC | PRN
  Administered 2017-04-22 (×3): 80 ug via INTRAVENOUS
  Administered 2017-04-22 (×2): 120 ug via INTRAVENOUS
  Administered 2017-04-22: 80 ug via INTRAVENOUS
  Administered 2017-04-22: 120 ug via INTRAVENOUS

## 2017-04-22 MED ORDER — ONDANSETRON HCL 4 MG/2ML IJ SOLN
INTRAMUSCULAR | Status: DC | PRN
  Administered 2017-04-22: 4 mg via INTRAVENOUS

## 2017-04-22 MED ORDER — FUROSEMIDE 10 MG/ML IJ SOLN: 40.0000 mg | Freq: Two times a day (BID) | INTRAMUSCULAR | Status: DC

## 2017-04-22 MED ORDER — IOHEXOL 300 MG/ML  SOLN
INTRAMUSCULAR | Status: DC | PRN
  Administered 2017-04-22: 30 mL

## 2017-04-22 MED ORDER — PROMETHAZINE HCL 25 MG/ML IJ SOLN: 6.2500 mg | INTRAMUSCULAR | Status: DC | PRN

## 2017-04-22 MED ORDER — PHENYLEPHRINE 40 MCG/ML (10ML) SYRINGE FOR IV PUSH (FOR BLOOD PRESSURE SUPPORT)
PREFILLED_SYRINGE | INTRAVENOUS | Status: AC
  Filled 2017-04-22: qty 30

## 2017-04-22 MED ORDER — PIPERACILLIN-TAZOBACTAM 3.375 G IVPB 30 MIN
3.3750 g | Freq: Once | INTRAVENOUS | Status: AC
  Administered 2017-04-22: 3.375 g via INTRAVENOUS
  Filled 2017-04-22: qty 50

## 2017-04-22 SURGICAL SUPPLY — 22 items
BAG URINE DRAINAGE (UROLOGICAL SUPPLIES) ×2 IMPLANT
BAG URO CATCHER STRL LF (MISCELLANEOUS) ×3 IMPLANT
BASKET ZERO TIP NITINOL 2.4FR (BASKET) IMPLANT
BSKT STON RTRVL ZERO TP 2.4FR (BASKET)
CATH FOLEY 2WAY SLVR  5CC 16FR (CATHETERS) ×2
CATH FOLEY 2WAY SLVR 5CC 16FR (CATHETERS) IMPLANT
CATH INTERMIT  6FR 70CM (CATHETERS) ×2 IMPLANT
CATH URET 5FR 28IN CONE TIP (BALLOONS) ×2
CATH URET 5FR 28IN OPEN ENDED (CATHETERS) ×2 IMPLANT
CATH URET 5FR 70CM CONE TIP (BALLOONS) IMPLANT
CLOTH BEACON ORANGE TIMEOUT ST (SAFETY) ×3 IMPLANT
COVER FOOTSWITCH UNIV (MISCELLANEOUS) IMPLANT
GLOVE BIOGEL M STRL SZ7.5 (GLOVE) ×3 IMPLANT
GOWN STRL REUS W/TWL LRG LVL3 (GOWN DISPOSABLE) ×5 IMPLANT
GUIDEWIRE ANG ZIPWIRE 038X150 (WIRE) IMPLANT
GUIDEWIRE STR DUAL SENSOR (WIRE) ×3 IMPLANT
MANIFOLD NEPTUNE II (INSTRUMENTS) ×3 IMPLANT
PACK CYSTO (CUSTOM PROCEDURE TRAY) ×3 IMPLANT
STENT URET 6FRX24 CONTOUR (STENTS) ×2 IMPLANT
SYR 10ML LL (SYRINGE) ×2 IMPLANT
TUBING CONNECTING 10 (TUBING) ×2 IMPLANT
TUBING CONNECTING 10' (TUBING) ×1

## 2017-04-22 NOTE — Progress Notes (Signed)
PROGRESS NOTE    [name]  WCH:[id number] DOB: 24-Nov-1955 DOA: 04/21/2017 PCP: Hilbert Corrigan, MD   Brief Narrative:   This is a 62 year old female with history of severe nondominant hemispheric stroke and associated dense left hemiparesis as well as neglect, seizure disorder, sleep apnea, hypertension, hypothyroidism, obesity, and chronic nephrolithiasis who was brought to the emergency department for slurred speech.  She was evaluated for possible CVA with multiple studies that appeared to be negative at this time.  Her speech has improved, however she is noted to have significant kidney injury which has been evaluated by nephrology as well as imaging demonstrating mild right hydronephrosis with a mildly obstructing stone on that side.  This was discussed with Dr. Junious Silk of urology who is recommending urgent right renal decompression due to not only her AKI, but UTI and developing sepsis.    Assessment & Plan:   Principal Problem:   Seizure disorder [facility]) Active Problems:   Morbid obesity ([place])   History of stroke   Thrombocytopenia, unspecified ([place])   Central sleep apnea   Hypothyroidism   HTN (hypertension)   AKI (acute kidney injury) ([place])   CVA (cerebral vascular accident) ([place])   1. Dysarthria secondary to CVA versus possible seizure disorder.    EEG performed with results pending.  Neurology consultation currently pending.  Speech appears to be improving with no further recommendations at this time. 2. Sepsis secondary to UTI.  Patient initially had some hypotension which has now improved.  Continue IV fluid as ordered by nephrology.  IV Zosyn has been initiated.  Recheck lactic acid in a.m.  Urine cultures pending. 3. Right-sided mild hydronephrosis with obstructing stone. To [facility] for right renal decompression with Dr. Junious Silk. Morphine added for pain control. 4. AKI. Appreciate Nephrology recommendations with IV Lasix and 1/2NS IVF. Continue to monitor and  avoid nephrotoxic agents. 5. Hypothyroidism.  Continue home Synthroid. 6. Central sleep apnea.  Continue home CPAP as previously. 7. Dyslipidemia.  Continue statin. Lipids show good control.    DVT prophylaxis:SCDs Code Status: Full Family Communication: Daughter at bedside; also spoke with husband and sister earlier Disposition Plan: To [facility] today for right renal decompression with Dr. Junious Silk. Will return here afterwards due to no bed availability.   Consultants:   Nephrology  Neurology  Urology  Procedures:   None  Antimicrobials:   IV Zosyn 2/14->   Subjective: Patient seen and evaluated today with worsening R sided flank and low back pain noted. She has had poor UO along with worsening AKI. Blood pressure is improved.  Objective: Vitals:   04/22/17 1500 04/22/17 1600 04/22/17 1630 04/22/17 1700  BP: 109/67 104/62 (!) 143/80 135/83  Pulse:      Resp: 19 (!) 23 (!) 22 20  Temp:      TempSrc:      SpO2:      Weight:        Intake/Output Summary (Last 24 hours) at 04/22/2017 1731 Last data filed at 04/22/2017 1634 Gross per 24 hour  Intake 3750 ml  Output -  Net 3750 ml   Filed Weights   04/21/17 1136  Weight: 97.5 kg (215 lb)    Examination:  General exam: Appears uncomfortable Respiratory system: Clear to auscultation. Respiratory effort normal. Cardiovascular system: S1 & S2 heard, RRR. No JVD, murmurs, rubs, gallops or clicks. No pedal edema. Gastrointestinal system: Abdomen is nondistended, soft and nontender. No organomegaly or masses felt. Normal bowel sounds heard. Central nervous system: Alert and  oriented. No focal neurological deficits. Extremities: Symmetric 5 x 5 power. Skin: No rashes, lesions or ulcers    Data Reviewed: I have personally reviewed following labs and imaging studies  CBC: Recent Labs  Lab 04/21/17 1107 04/21/17 1121 04/22/17 0449  WBC 12.6*  --  19.0*  NEUTROABS 10.2  --   --   HGB 15.3* 15.6* 13.3  HCT 49.8*  46.0 43.3  MCV 96.5  --  95.2  PLT 58*  --  50*   Basic Metabolic Panel: Recent Labs  Lab 04/21/17 1107 04/21/17 1121 04/22/17 0449  NA 146* 146* 146*  K 4.4 4.4 4.4  CL 107 109 108  CO2 21*  --  24  GLUCOSE 94 88 69  BUN 28* 26* 39*  CREATININE 2.04* 2.00* 2.69*  CALCIUM 9.5  --  8.4*   GFR: CrCl cannot be calculated (Unknown ideal weight.). Liver Function Tests: Recent Labs  Lab 04/21/17 1107  AST 42*  ALT 12*  ALKPHOS 66  BILITOT 0.9  PROT 7.3  ALBUMIN 3.3*   No results for input(s): LIPASE, AMYLASE in the last 168 hours. No results for input(s): AMMONIA in the last 168 hours. Coagulation Profile: Recent Labs  Lab 04/21/17 1107  INR 1.22   Cardiac Enzymes: No results for input(s): CKTOTAL, CKMB, CKMBINDEX, TROPONINI in the last 168 hours. BNP (last 3 results) No results for input(s): PROBNP in the last 8760 hours. HbA1C: Recent Labs    04/21/17 1107  HGBA1C 6.3*   CBG: Recent Labs  Lab 04/21/17 1054  GLUCAP 85   Lipid Profile: Recent Labs    04/22/17 0449  CHOL 107  HDL 30*  LDLCALC 45  TRIG 160*  CHOLHDL 3.6   Thyroid Function Tests: No results for input(s): TSH, T4TOTAL, FREET4, T3FREE, THYROIDAB in the last 72 hours. Anemia Panel: No results for input(s): VITAMINB12, FOLATE, FERRITIN, TIBC, IRON, RETICCTPCT in the last 72 hours. Sepsis Labs: Recent Labs  Lab 04/22/17 0900  LATICACIDVEN 2.0*    Recent Results (from the past 240 hour(s))  Culture, blood (routine x 2)     Status: None (Preliminary result)   Collection Time: 04/22/17  8:02 AM  Result Value Ref Range Status   Specimen Description BLOOD RIGHT HAND  Final   Special Requests   Final    BOTTLES DRAWN AEROBIC ONLY Blood Culture results may not be optimal due to an inadequate volume of blood received in culture bottles Performed at [facility], [address] [place], [place] [address]    Culture PENDING  Incomplete   Report Status PENDING  Incomplete  Culture, blood  (routine x 2)     Status: None (Preliminary result)   Collection Time: 04/22/17  9:00 AM  Result Value Ref Range Status   Specimen Description BLOOD LEFT HAND  Final   Special Requests   Final    BOTTLES DRAWN AEROBIC AND ANAEROBIC Blood Culture adequate volume Performed at [facility], [address] [place], [place]  [address]    Culture PENDING  Incomplete   Report Status PENDING  Incomplete         Radiology Studies: Ct Abdomen Pelvis Wo Contrast  Result Date: 04/22/2017 CLINICAL DATA:  Recurrent urinary tract infection. Diabetes. Hydronephrosis seen on recent ultrasound. EXAM: CT ABDOMEN AND PELVIS WITHOUT CONTRAST TECHNIQUE: Multidetector CT imaging of the abdomen and pelvis was performed following the standard protocol without IV contrast. COMPARISON:  04/22/2017 FINDINGS: Despite efforts by the technologist and patient, motion artifact is present on  today's exam and could not be eliminated. This reduces exam sensitivity and specificity. Lower chest: Mild cardiomegaly with small bilateral pleural effusions and passive atelectasis in the lung bases. Hepatobiliary: Dependent density in the gallbladder could be sludge or gallstones. Otherwise unremarkable. Pancreas: Unremarkable Spleen: Unremarkable Adrenals/Urinary Tract: The adrenal glands appear normal. There is abnormal bilateral delayed nephrogram with contrast medium in both collecting systems, ureters, and the mostly empty urinary bladder (Foley catheter in place). There is right hydronephrosis along with asymmetric abnormal stranding around the right renal collecting system. Patchy variable contrast in the left kidney noted. With the use of bone window in, the contrast in the renal collecting systems can be somewhat differentiated from the calculi. There is a central left renal collecting system calculus measuring 1.3 by 0.9 cm on image 40/2. Multiple nonobstructive left renal collecting system calculi are shown for example on image  33/2 using bone windows. Multiple clustered calcifications in the left kidney lower pole are also present. Particularly on the left side. Stomach/Bowel: There is an unusual appearance of the rectum, with marked wall thickening in the lower rectum circumferentially, and a patulous anus. Vascular/Lymphatic: Scattered small retroperitoneal lymph nodes are present. These may be reactive. Aortoiliac atherosclerotic vascular disease. Reproductive: Multiple uterine fibroids, subserosal and intramural, similar contour to the prior exam. Other: No supplemental non-categorized findings. Musculoskeletal: High density along the margins of the inferior thecal sac on image 64/6, possibly calcification or contrast, less likely blood products. An intrathecal pump is present. IMPRESSION: 1. Mild right hydronephrosis related to a 1.3 by 0.9 cm right collecting system stone which is likely mildly obstructing the right UPJ. 2. Abnormal delayed nephrograms compatible with acute renal injury. This contrast was administered yesterday and is still in the kidneys. There is also some excreted contrast in the collecting systems and ureters. 3. Greater degree of heterogeneity in the left kidney than the right with regard to the contrast distribution and appearance, along with left perirenal stranding somewhat greater than the right. I cannot exclude pyelonephritis given this appearance, but the appearance is not considered specific given the difference in degree of obstruction and the overall unusual situation of renal contrast 1 day after administration. 4. Mild cardiomegaly with bilateral pleural effusions. 5. Abnormal appearance of the rectum with considerable distal rectal wall thickening and patulous anus. 6. There is high density in the thecal sac inferiorly, probably calcification. 7. Other imaging findings of potential clinical significance: Uterine fibroids. Possible gallstones. Aortic Atherosclerosis (ICD10-I70.0). Electronically  Signed   By: Van Clines M.D.   On: 04/22/2017 17:06   Ct Angio Head W Or Wo Contrast  Result Date: 04/21/2017 CLINICAL DATA:  Altered mental status.  Abnormal speech. EXAM: CT ANGIOGRAPHY HEAD AND NECK CT PERFUSION BRAIN TECHNIQUE: Multidetector CT imaging of the head and neck was performed using the standard protocol during bolus administration of intravenous contrast. Multiplanar CT image reconstructions and MIPs were obtained to evaluate the vascular anatomy. Carotid stenosis measurements (when applicable) are obtained utilizing NASCET criteria, using the distal internal carotid diameter as the denominator. Multiphase CT imaging of the brain was performed following IV bolus contrast injection. Subsequent parametric perfusion maps were calculated using RAPID software. CONTRAST:  46mL ISOVUE-370 IOPAMIDOL (ISOVUE-370) INJECTION 76% COMPARISON:  Noncontrast head CT today. No prior angiographic imaging. FINDINGS: CTA NECK FINDINGS Aortic arch: Standard 3 vessel aortic arch with mild atherosclerotic plaque. Widely patent arch vessel origins. Right carotid system: Patent without evidence of stenosis or dissection. Left carotid system: Patent without evidence of  stenosis or dissection. Vertebral arteries: Patent and codominant without evidence of dissection or hemodynamically significant stenosis. Possible mild left vertebral artery origin stenosis, with motion artifact limiting assessment. Skeleton: Right anterior pterional craniotomy. Moderate cervical disc and facet degeneration. Other neck: No mass or enlarged lymph nodes. Upper chest: Motion artifact in the lung apices with posterior right upper lobe opacity likely reflecting atelectasis. Review of the MIP images confirms the above findings CTA HEAD FINDINGS Anterior circulation: The internal carotid arteries are widely patent from skull base to carotid termini. An aneurysm clip is present in the anterior communicating region with associated streak  artifact. The ACAs are patent, with the right A1 segment being hypoplastic. No recurrent/residual aneurysm is identified. The MCAs are patent without evidence of proximal branch occlusion or significant proximal stenosis. There is diffuse asymmetric attenuation of distal branch vessels throughout the right MCA territory. Posterior circulation: The intracranial vertebral arteries are widely patent to the basilar. Dominant right PICA and left AICA. Patent SCAs. The basilar artery is widely patent. There is a patent right posterior communicating artery, and the right P1 segment appears hypoplastic. The PCAs are patent without evidence of significant stenosis. No aneurysm. Venous sinuses: Patent. Anatomic variants: Hypoplastic right A1 and right P1 segments. Delayed phase: No abnormal enhancement. Review of the MIP images confirms the above findings CT Brain Perfusion Findings: There is prominent motion artifact in the mid to latter part of the acquisition. CBF (<30%) Volume: 0 mL Perfusion (Tmax>6.0s) volume: 136 mL, however this includes portions of the posterior fossa and posterior left cerebral hemisphere which are favored to be artifactual. The majority of the prolonged transit involves the right cerebral hemisphere peripheral to the large area of chronic encephalomalacia. Infarction Location: None based on a threshold of CBF <30% of the contralateral side, although there is focal less severely reduced CBF (<38%) in the high right frontoparietal region IMPRESSION: 1. No emergent large vessel occlusion. 2. 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 Diffuse attenuation of distal right MCA branch vessels with prolonged transit throughout the right hemisphere peripheral to the encephalomalacia, likely chronic. No definite acute core infarct based on motion degraded perfusion imaging. 3. No significant proximal intracranial arterial stenosis. 4.  Widely patent cervical carotid arteries. 5. 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 These results were called by telephone at the time of interpretation on 04/21/2017 at 12:36 pm to Dr. Thurnell Garbe, who verbally acknowledged these results. Electronically Signed   By: Logan Bores M.D.   On: 04/21/2017 13:09   Ct Angio Neck W Or Wo Contrast  Result Date: 04/21/2017 CLINICAL DATA:  Altered mental status.  Abnormal speech. EXAM: CT ANGIOGRAPHY HEAD AND NECK CT PERFUSION BRAIN TECHNIQUE: Multidetector CT imaging of the head and neck was performed using the standard protocol during bolus administration of intravenous contrast. Multiplanar CT image reconstructions and MIPs were obtained to evaluate the vascular anatomy. Carotid stenosis measurements (when applicable) are obtained utilizing NASCET criteria, using the distal internal carotid diameter as the denominator. Multiphase CT imaging of the brain was performed following IV bolus contrast injection. Subsequent parametric perfusion maps were calculated using RAPID software. CONTRAST:  55mL ISOVUE-370 IOPAMIDOL (ISOVUE-370) INJECTION 76% COMPARISON:  Noncontrast head CT today. No prior angiographic imaging. FINDINGS: CTA NECK FINDINGS Aortic arch: Standard 3 vessel aortic arch with mild atherosclerotic plaque. Widely patent arch vessel origins. Right carotid system: Patent without evidence of stenosis or dissection. Left carotid system: Patent without  evidence of stenosis or dissection. Vertebral arteries: Patent and codominant without evidence of dissection or hemodynamically significant stenosis. Possible mild left vertebral artery origin stenosis, with motion artifact limiting assessment. Skeleton: Right anterior pterional craniotomy. Moderate cervical disc and facet degeneration. Other neck: No mass or enlarged lymph nodes. Upper chest: Motion artifact in the lung apices with posterior right upper lobe opacity likely reflecting atelectasis.  Review of the MIP images confirms the above findings CTA HEAD FINDINGS Anterior circulation: The internal carotid arteries are widely patent from skull base to carotid termini. An aneurysm clip is present in the anterior communicating region with associated streak artifact. The ACAs are patent, with the right A1 segment being hypoplastic. No recurrent/residual aneurysm is identified. The MCAs are patent without evidence of proximal branch occlusion or significant proximal stenosis. There is diffuse asymmetric attenuation of distal branch vessels throughout the right MCA territory. Posterior circulation: The intracranial vertebral arteries are widely patent to the basilar. Dominant right PICA and left AICA. Patent SCAs. The basilar artery is widely patent. There is a patent right posterior communicating artery, and the right P1 segment appears hypoplastic. The PCAs are patent without evidence of significant stenosis. No aneurysm. Venous sinuses: Patent. Anatomic variants: Hypoplastic right A1 and right P1 segments. Delayed phase: No abnormal enhancement. Review of the MIP images confirms the above findings CT Brain Perfusion Findings: There is prominent motion artifact in the mid to latter part of the acquisition. CBF (<30%) Volume: 0 mL Perfusion (Tmax>6.0s) volume: 136 mL, however this includes portions of the posterior fossa and posterior left cerebral hemisphere which are favored to be artifactual. The majority of the prolonged transit involves the right cerebral hemisphere peripheral to the large area of chronic encephalomalacia. Infarction Location: None based on a threshold of CBF <30% of the contralateral side, although there is focal less severely reduced CBF (<38%) in the high right frontoparietal region IMPRESSION: 1. No emergent large vessel occlusion. 2. 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 Diffuse attenuation of distal right MCA  branch vessels with prolonged transit throughout the right hemisphere peripheral to the encephalomalacia, likely chronic. No definite acute core infarct based on motion degraded perfusion imaging. 3. No significant proximal intracranial arterial stenosis. 4. Widely patent cervical carotid arteries. 5. 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 These results were called by telephone at the time of interpretation on 04/21/2017 at 12:36 pm to Dr. Thurnell Garbe, who verbally acknowledged these results. Electronically Signed   By: Logan Bores M.D.   On: 04/21/2017 13:09   US Renal  Result Date: 04/22/2017 CLINICAL DATA:  Acute renal injury. EXAM: RENAL / URINARY TRACT ULTRASOUND COMPLETE COMPARISON:  Ultrasound 04/21/2017. FINDINGS: Right Kidney: Length: 10.2 cm. Echogenicity within normal limits. No mass. Mild hydronephrosis. 11 mm renal pelvic stone. Left Kidney: Length: 10.6 cm. Echogenicity within normal limits. No mass or hydronephrosis visualized. Limited evaluation due to overlying bowel gas, patient's body habitus, and positioning difficulty. Bladder: Bladder is nondistended. By history the patient has a Foley catheter. IMPRESSION: 1. 11 mm right renal pelvic stone. Mild hydronephrosis. Stable exam. No change from prior study of 04/21/2017. 2. Limited evaluation of the left kidney due to overlying bowel gas, patient's body habitus, and positioning difficulty. Electronically Signed   By: Marcello Moores  Register   On: 04/22/2017 10:24   US Renal  Result Date: 04/21/2017 CLINICAL DATA:  Acute kidney injury. EXAM: RENAL / URINARY TRACT ULTRASOUND COMPLETE COMPARISON:  CT  scan of the abdomen dated 01/06/2013 FINDINGS: Right Kidney: Length: 10.5 cm. 11 mm stone right renal pelvis. Minimal prominence of the calices. Renal pelvis is not distended. Echogenicity within normal limits. No mass visualized. Left Kidney: Length: 11.7 cm. Small stones in the lower pole. No hydronephrosis. Echogenicity within normal  limits. No mass or hydronephrosis visualized. Bladder: Appears normal for degree of bladder distention. IMPRESSION: 1. Chronic bilateral renal calculi. 2. Minimal dilatation of the calices of the right kidney without dilatation of the renal pelvis or ureter. Electronically Signed   By: Lorriane Shire M.D.   On: 04/21/2017 17:26   Ct Cerebral Perfusion W Contrast  Result Date: 04/21/2017 CLINICAL DATA:  Altered mental status.  Abnormal speech. EXAM: CT ANGIOGRAPHY HEAD AND NECK CT PERFUSION BRAIN TECHNIQUE: Multidetector CT imaging of the head and neck was performed using the standard protocol during bolus administration of intravenous contrast. Multiplanar CT image reconstructions and MIPs were obtained to evaluate the vascular anatomy. Carotid stenosis measurements (when applicable) are obtained utilizing NASCET criteria, using the distal internal carotid diameter as the denominator. Multiphase CT imaging of the brain was performed following IV bolus contrast injection. Subsequent parametric perfusion maps were calculated using RAPID software. CONTRAST:  65mL ISOVUE-370 IOPAMIDOL (ISOVUE-370) INJECTION 76% COMPARISON:  Noncontrast head CT today. No prior angiographic imaging. FINDINGS: CTA NECK FINDINGS Aortic arch: Standard 3 vessel aortic arch with mild atherosclerotic plaque. Widely patent arch vessel origins. Right carotid system: Patent without evidence of stenosis or dissection. Left carotid system: Patent without evidence of stenosis or dissection. Vertebral arteries: Patent and codominant without evidence of dissection or hemodynamically significant stenosis. Possible mild left vertebral artery origin stenosis, with motion artifact limiting assessment. Skeleton: Right anterior pterional craniotomy. Moderate cervical disc and facet degeneration. Other neck: No mass or enlarged lymph nodes. Upper chest: Motion artifact in the lung apices with posterior right upper lobe opacity likely reflecting  atelectasis. Review of the MIP images confirms the above findings CTA HEAD FINDINGS Anterior circulation: The internal carotid arteries are widely patent from skull base to carotid termini. An aneurysm clip is present in the anterior communicating region with associated streak artifact. The ACAs are patent, with the right A1 segment being hypoplastic. No recurrent/residual aneurysm is identified. The MCAs are patent without evidence of proximal branch occlusion or significant proximal stenosis. There is diffuse asymmetric attenuation of distal branch vessels throughout the right MCA territory. Posterior circulation: The intracranial vertebral arteries are widely patent to the basilar. Dominant right PICA and left AICA. Patent SCAs. The basilar artery is widely patent. There is a patent right posterior communicating artery, and the right P1 segment appears hypoplastic. The PCAs are patent without evidence of significant stenosis. No aneurysm. Venous sinuses: Patent. Anatomic variants: Hypoplastic right A1 and right P1 segments. Delayed phase: No abnormal enhancement. Review of the MIP images confirms the above findings CT Brain Perfusion Findings: There is prominent motion artifact in the mid to latter part of the acquisition. CBF (<30%) Volume: 0 mL Perfusion (Tmax>6.0s) volume: 136 mL, however this includes portions of the posterior fossa and posterior left cerebral hemisphere which are favored to be artifactual. The majority of the prolonged transit involves the right cerebral hemisphere peripheral to the large area of chronic encephalomalacia. Infarction Location: None based on a threshold of CBF <30% of the contralateral side, although there is focal less severely reduced CBF (<38%) in the high right frontoparietal region IMPRESSION: 1. No emergent large vessel occlusion. 2. Large area of chronic encephalomalacia in  the right cerebral hemisphere in the setting of prior aneurysm repair. Diffuse attenuation of  distal right MCA branch vessels with prolonged transit throughout the right hemisphere peripheral to the encephalomalacia, likely chronic. No definite acute core infarct based on motion degraded perfusion imaging. 3. No significant proximal intracranial arterial stenosis. 4. Widely patent cervical carotid arteries. 5. 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 These results were called by telephone at the time of interpretation on 04/21/2017 at 12:36 pm to Dr. Thurnell Garbe, who verbally acknowledged these results. Electronically Signed   By: Logan Bores M.D.   On: 04/21/2017 13:09   Dg Chest Port 1 View  Result Date: 04/21/2017 CLINICAL DATA:  Slurred speech. EXAM: PORTABLE CHEST 1 VIEW COMPARISON:  02/06/2015 FINDINGS: The heart size and pulmonary vascularity are normal and the lungs are clear. No acute bone abnormality. IMPRESSION: No active disease. Electronically Signed   By: Lorriane Shire M.D.   On: 04/21/2017 11:42   Ct Head Code Stroke Wo Contrast  Result Date: 04/21/2017 CLINICAL DATA:  Code stroke.  Code stroke.  Abnormal speech. EXAM: CT HEAD WITHOUT CONTRAST TECHNIQUE: Contiguous axial images were obtained from the base of the skull through the vertex without intravenous contrast. COMPARISON:  CT head without contrast 09/16/2012 FINDINGS: Brain: Remote right MCA territory encephalomalacia is stable. There is ex vacuo dilation of the right lateral ventricle. Right paraophthalmic aneurysm clip is noted. Left frontal encephalomalacia is stable. Density within the left frontal white matter is stable. 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 No acute infarct is present. Left insular ribbon is normal. No focal cortical abnormality is present. Vascular: Ventricles are of normal size. Ex vacuo dilation of the right lateral ventricle is stable. Skull: Calvarium is intact. No focal lytic or blastic lesions are present. Sinuses/Orbits: The paranasal sinuses  and mastoid air cells are clear. Globes and orbits are unremarkable. ASPECTS [facility] Stroke Program Early CT Score) - Ganglionic level infarction (caudate, lentiform nuclei, internal capsule, insula, M1-M3 cortex): 7/7 - Supraganglionic infarction (M4-M6 cortex): 3/3 Total score (0-10 with 10 being normal): 10/10 IMPRESSION: 1. No acute intracranial abnormality or significant interval change. 2. 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 4. ASPECTS is 10/10 These results were called by telephone at the time of interpretation on 04/21/2017 at 10:56 am to Dr. Francine Graven , who verbally acknowledged these results. Electronically Signed   By: San Morelle M.D.   On: 04/21/2017 11:02        Scheduled Meds: . divalproex  125 mg Oral BID  . divalproex  500 mg Oral BID  . DULoxetine  20 mg Oral Daily  . furosemide  40 mg Intravenous BID  . levETIRAcetam  1,500 mg Oral QHS  . levothyroxine  100 mcg Oral QAC breakfast  . loratadine  10 mg Oral Daily  . nystatin  1 g Topical BID  . nystatin cream  1 application Topical BID  . olopatadine  1 drop Both Eyes BID  . pantoprazole  40 mg Oral Daily  . polyethylene glycol  17 g Oral Daily  . rosuvastatin  5 mg Oral QHS   Continuous Infusions: . sodium chloride 135 mL/hr at 04/22/17 1703  . piperacillin-tazobactam (ZOSYN)  IV       LOS: 1 day    Time spent: 30 minutes    Nygel Prokop Darleen Crocker, DO [facility] Pager [phone]  If 7PM-7AM,  please contact night-coverage [email or web address] Password [facility] 04/22/2017, 5:31 PM

## 2017-04-22 NOTE — Progress Notes (Signed)
I discussed the patient with Dr. Manuella Ghazi several times today and we were waiting on CT scan. I spoke with radiology and certainly she has some component of ATN with delayed drainage of contrast from both sides and possible pyelonephritis on the left with heterogeneous appearance, but she does have right hydronephrosis somewhat increased from the 2014 scan. Given the uroseptic picture we will plan for right renal decompression with a stent (I spoke with intrventional and percutaneous nephrostomy would be difficult given only mild to moderate hydro and the longer skin to collecting system distance). I also spoke with anesthesiologist at [facility] earlier today in anticipation the patient may need a procedure and he did not recommend sedation/anesthesia at [facility] given he would not be in-house and the patient's history of stroke and obesity.

## 2017-04-22 NOTE — Anesthesia Preprocedure Evaluation (Addendum)
Anesthesia Evaluation  Patient identified by MRN, date of birth, ID band Patient awake    Reviewed: Allergy & Precautions, NPO status , Patient's Chart, lab work & pertinent test results  Airway Mallampati: II  TM Distance: >3 FB Neck ROM: Full    Dental  (+) Missing   Pulmonary sleep apnea ,    Pulmonary exam normal breath sounds clear to auscultation       Cardiovascular hypertension, Normal cardiovascular exam Rhythm:Regular Rate:Normal     Neuro/Psych Seizures -,  CVA, Residual Symptoms negative psych ROS   GI/Hepatic negative GI ROS, Neg liver ROS,   Endo/Other  diabetesHypothyroidism   Renal/GU Renal InsufficiencyRenal disease  negative genitourinary   Musculoskeletal negative musculoskeletal ROS (+)   Abdominal   Peds negative pediatric ROS (+)  Hematology Thrombocytopenia 50K   Anesthesia Other Findings   Reproductive/Obstetrics negative OB ROS                            Anesthesia Physical Anesthesia Plan  ASA: IV  Anesthesia Plan: General   Post-op Pain Management:    Induction: Intravenous  PONV Risk Score and Plan: 3 and Ondansetron and Treatment may vary due to age or medical condition  Airway Management Planned: LMA  Additional Equipment:   Intra-op Plan:   Post-operative Plan: Extubation in OR  Informed Consent: I have reviewed the patients History and Physical, chart, labs and discussed the procedure including the risks, benefits and alternatives for the proposed anesthesia with the patient or authorized representative who has indicated his/her understanding and acceptance.   Dental advisory given  Plan Discussed with: CRNA and Surgeon  Anesthesia Plan Comments:         Anesthesia Quick Evaluation

## 2017-04-22 NOTE — Consult Note (Signed)
[place] A. Merlene Laughter, MD     [email or web address]          DEBBRAH SAMPEDRO is an 62 y.o. female.   ASSESSMENT/PLAN: Chart reviewed but pt left to [facility]. EEG abnormal but do evidence of ongoing SZ. Seen routinely in our clinic and Sz recently well controlled on current meds.    Blood pressure 135/83, pulse 90, temperature 100.2 F (37.9 C), temperature source Rectal, resp. rate 20, weight 215 lb (97.5 kg), SpO2 95 %.  Past Medical History:  Diagnosis Date  . Acute respiratory failure ([place]) 07/27/2012  . Arthritis    OSTEO LEFT LEG  . Chronic pain   . CVA (cerebral vascular accident) ([place])    left sided hemiparesis  . Depression   . Diabetes mellitus without complication ([place])   . Hemiparesis affecting left side as late effect of cerebrovascular accident ([place])   . Hypertension   . Metabolic encephalopathy 15/07/6977  . Narcotic overdose ([place]) 07/27/2012  . Respiratory failure ([place]) 05/06/10  . Seizure disorder ([place])   . Seizures ([place])   . Sleep apnea 04/2010   on CPAP, "severe central sleep apnea"  . Sleep apnea   . Spasticity    chronic  . 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 Thrombocytopenia, unspecified ([place]) 07/28/2012  . Unspecified hypothyroidism   . 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 UTI (lower urinary tract infection) 05/06/10    Past Surgical History:  Procedure Laterality Date  . CEREBRAL ANEURYSM REPAIR  1999  . INTRATHECAL PUMP IMPLANTATION  2010   [employer]:  fentanyl, baclofen changed to morphine and baclofen on 07/2012    No family history on file.  Social History:  reports that  has never smoked. she has never used smokeless tobacco. She reports that she does not drink alcohol or use drugs.  Allergies:  Allergies  Allergen Reactions  . Codeine Other (See Comments)    unknown  . Lacosamide Other (See Comments)    Mood changes/agitation No rx noted    . Morphine Other (See Comments)    No rx noted    .  Zolpidem     Medications: Prior to Admission medications   Medication Sig Start Date End Date Taking? Authorizing Provider  acetaminophen (TYLENOL) 160 MG/5ML suspension Take 960 mg by mouth every 6 (six) hours as needed for moderate pain or fever.   Yes [provider]  amLODipine (NORVASC) 5 MG tablet Take 5 mg by mouth daily.   Yes [provider]  aspirin EC 81 MG tablet Take 81 mg by mouth daily.   Yes [provider]  cloNIDine (CATAPRES) 0.1 MG tablet Take 0.1 mg by mouth every 6 (six) hours as needed (for SBP greater than 160).   Yes [provider]  divalproex (DEPAKOTE) 125 MG DR tablet Take 125 mg by mouth 2 (two) times daily.   Yes [provider]  divalproex (DEPAKOTE) 500 MG DR tablet Take 500 mg by mouth 2 (two) times daily.   Yes [provider]  DULoxetine (CYMBALTA) 20 MG capsule Take 20 mg by mouth daily.   Yes [provider]  labetalol (NORMODYNE) 200 MG tablet Take 200 mg by mouth 2 (two) times daily.   Yes [provider]  lactulose, encephalopathy, (CHRONULAC) 10 GM/15ML SOLN Take 20 g by mouth daily as needed.   Yes [provider]  levETIRAcetam (KEPPRA) 1000 MG tablet Take 1,000 mg by mouth daily.   Yes [provider]  levETIRAcetam (KEPPRA) 500 MG tablet Take 1,500 mg by mouth at bedtime.   Yes [provider]  levothyroxine (SYNTHROID, LEVOTHROID) 100 MCG tablet Take 100 mcg by mouth daily before breakfast.   Yes [provider]  lidocaine (LIDODERM) 5 % Place 1 patch onto the skin daily. Remove & Discard patch within 12 hours or as directed by MD   Yes [provider]  loratadine (CLARITIN) 10 MG tablet Take 10 mg by mouth daily.   Yes [provider]  losartan (COZAAR) 50 MG tablet Take 50 mg by mouth at bedtime.   Yes [provider]  Menthol, Topical Analgesic, 5 % GEL Apply 1 application topically 4 (four) times daily as needed.    Yes [provider]  Multiple Vitamin (MULTIVITAMIN WITH MINERALS) TABS tablet Take 1 tablet by mouth daily.   Yes [provider]  nystatin (NYSTATIN) powder Apply 1 g topically 2 (two) times daily.   Yes [provider]  nystatin cream (MYCOSTATIN) Apply 1 application topically 2 (two) times daily.   Yes [provider]  Olopatadine HCl (PATADAY) 0.2 % SOLN Place 1 drop into both eyes at bedtime.   Yes [provider]  omeprazole (PRILOSEC) 20 MG capsule Take 20 mg by mouth daily.   Yes [provider]  oxyCODONE (OXY IR/ROXICODONE) 5 MG immediate release tablet Take 5 mg by mouth every 8 (eight) hours as needed for pain (for moderate to severe pain).   Yes [provider]  Polyethyl Glycol-Propyl Glycol (SYSTANE) 0.4-0.3 % SOLN Place 1 drop into both eyes 3 (three) times daily as needed.   Yes [provider]  polyethylene glycol (MIRALAX / GLYCOLAX) packet Take 17 g by mouth daily.   Yes [provider]  rosuvastatin (CRESTOR) 5 MG tablet Take 5 mg by mouth at bedtime.   Yes [provider]  traZODone (DESYREL) 50 MG tablet Take 50 mg by mouth at bedtime.   Yes [provider]  Vitamin D, Ergocalciferol, (DRISDOL) 50000 units CAPS capsule Take 50,000 Units by mouth every 30 (thirty) days.   Yes [provider]  DULoxetine (CYMBALTA) 60 MG capsule Take 60 mg by mouth daily.    [provider]    Scheduled Meds: . divalproex  125 mg Oral BID  . divalproex  500 mg Oral BID  . DULoxetine  20 mg Oral Daily  . furosemide  40 mg Intravenous BID  . levETIRAcetam  1,500 mg Oral QHS  . levothyroxine  100 mcg Oral QAC breakfast  . loratadine  10 mg Oral Daily  . nystatin  1 g Topical BID  . nystatin cream  1 application Topical BID  . olopatadine  1 drop Both Eyes BID  . pantoprazole  40 mg Oral Daily  . polyethylene glycol  17 g Oral Daily  . rosuvastatin  5 mg Oral QHS    Continuous Infusions: . sodium chloride 135 mL/hr at 04/22/17 1703  . piperacillin-tazobactam (ZOSYN)  IV     PRN Meds:.acetaminophen **OR** acetaminophen (TYLENOL) oral liquid 160 mg/5 mL **OR** acetaminophen, fentaNYL (SUBLIMAZE) injection     Results for orders placed or performed during the hospital encounter of 04/21/17 (from the past 48 hour(s))  CBG monitoring, ED     Status: None   Collection Time: 04/21/17 10:54 AM  Result Value Ref Range   Glucose-Capillary 85 65 - 99 mg/dL  Ethanol     Status: None   Collection Time: 04/21/17 11:07  AM  Result Value Ref Range   Alcohol, Ethyl (B) <10 <10 mg/dL    Comment:        LOWEST DETECTABLE LIMIT FOR SERUM ALCOHOL IS 10 mg/dL FOR MEDICAL PURPOSES ONLY Performed at [facility], [address], [place], [place] [address]   Protime-INR     Status: Abnormal   Collection Time: 04/21/17 11:07 AM  Result Value Ref Range   Prothrombin Time 15.3 (H) 11.4 - 15.2 seconds   INR 1.22     Comment: Performed at [facility], [address] [place] [address] [place], [place] [address]  APTT     Status: Abnormal   Collection Time: 04/21/17 11:07 AM  Result Value Ref Range   aPTT 60 (H) 24 - 36 seconds    Comment:        IF BASELINE aPTT IS ELEVATED, SUGGEST PATIENT RISK ASSESSMENT BE USED TO DETERMINE APPROPRIATE ANTICOAGULANT THERAPY. Performed at [facility], [address] [place], [place] [address]   CBC     Status: Abnormal   Collection Time: 04/21/17 11:07 AM  Result Value Ref Range   WBC 12.6 (H) 4.0 - 10.5 K/uL   RBC 5.16 (H) 3.87 - 5.11 MIL/uL   Hemoglobin 15.3 (H) 12.0 - 15.0 g/dL   HCT 49.8 (H) 36.0 - 46.0 %   MCV 96.5 78.0 - 100.0 fL   MCH 29.7 26.0 - 34.0 pg   MCHC 30.7 30.0 - 36.0 g/dL   RDW 15.6 (H) 11.5 - 15.5 %   Platelets 58 (L) 150 - 400 K/uL    Comment: SPECIMEN CHECKED FOR CLOTS PLATELET COUNT CONFIRMED BY SMEAR Performed at [facility], [address] [place], [place] [address]   Differential     Status: None    Collection Time: 04/21/17 11:07 AM  Result Value Ref Range   Neutrophils Relative % 81 %   Neutro Abs 10.2 1.7 - 7.7 K/uL   Lymphocytes Relative 14 %   Lymphs Abs 1.7 0.7 - 4.0 K/uL   Monocytes Relative 5 %   Monocytes Absolute 0.6 0.1 - 1.0 K/uL   Eosinophils Relative 0 %   Eosinophils Absolute 0.1 0.0 - 0.7 K/uL   Basophils Relative 0 %   Basophils Absolute 0.0 0.0 - 0.1 K/uL   WBC Morphology ATYPICAL LYMPHOCYTES     Comment: MILD LEFT SHIFT (1-5% METAS, OCC MYELO, OCC BANDS) Performed at [facility], [address], [place], [place] [address]   Comprehensive metabolic panel     Status: Abnormal   Collection Time: 04/21/17 11:07 AM  Result Value Ref Range   Sodium 146 (H) 135 - 145 mmol/L   Potassium 4.4 3.5 - 5.1 mmol/L   Chloride 107 101 - 111 mmol/L   CO2 21 (L) 22 - 32 mmol/L   Glucose, Bld 94 65 - 99 mg/dL   BUN 28 (H) 6 - 20 mg/dL   Creatinine, Ser 2.04 (H) 0.44 - 1.00 mg/dL   Calcium 9.5 8.9 - 10.3 mg/dL   Total Protein 7.3 6.5 - 8.1 g/dL   Albumin 3.3 (L) 3.5 - 5.0 g/dL   AST 42 (H) 15 - 41 U/L   ALT 12 (L) 14 - 54 U/L   Alkaline Phosphatase 66 38 - 126 U/L   Total Bilirubin 0.9 0.3 - 1.2 mg/dL   GFR calc non Af Amer 25 (L) >60 mL/min   GFR calc Af Amer 29 (L) >60 mL/min    Comment: (NOTE) The eGFR has been calculated using the CKD  EPI equation. This calculation has not been validated in all clinical situations. eGFR's persistently <60 mL/min signify possible Chronic Kidney Disease.    Anion gap 18 (H) 5 - 15    Comment: Performed at [facility], [address] [place], [place] [address]  Hemoglobin A1c     Status: Abnormal   Collection Time: 04/21/17 11:07 AM  Result Value Ref Range   Hgb A1c MFr Bld 6.3 (H) 4.8 - 5.6 %    Comment: (NOTE) Pre diabetes:          5.7%-6.4% Diabetes:              >6.4% Glycemic control for   <7.0% adults with diabetes    Mean Plasma Glucose 134.11 mg/dL    Comment: Performed at [facility] [address]  [place], [place] [address]  I-stat troponin, ED (not at [facility], [facility])     Status: None   Collection Time: 04/21/17 11:19 AM  Result Value Ref Range   Troponin i, poc 0.00 0.00 - 0.08 ng/mL   Comment 3            Comment: Due to the release kinetics of cTnI, a negative result within the first hours of the onset of symptoms does not rule out myocardial infarction with certainty. If myocardial infarction is still suspected, repeat the test at appropriate intervals.   I-Stat Chem 8, ED  (not at [facility], [facility])     Status: Abnormal   Collection Time: 04/21/17 11:21 AM  Result Value Ref Range   Sodium 146 (H) 135 - 145 mmol/L   Potassium 4.4 3.5 - 5.1 mmol/L   Chloride 109 101 - 111 mmol/L   BUN 26 (H) 6 - 20 mg/dL   Creatinine, Ser 2.00 (H) 0.44 - 1.00 mg/dL   Glucose, Bld 88 65 - 99 mg/dL   Calcium, Ion 1.10 (L) 1.15 - 1.40 mmol/L   TCO2 23 22 - 32 mmol/L   Hemoglobin 15.6 (H) 12.0 - 15.0 g/dL   HCT 46.0 36.0 - 46.0 %  Valproic acid level     Status: None   Collection Time: 04/21/17  5:15 PM  Result Value Ref Range   Valproic Acid Lvl 59 50.0 - 100.0 ug/mL    Comment: Performed at [facility], [address], [place], [place] [address]  HIV antibody (Routine Testing)     Status: None   Collection Time: 04/21/17  5:15 PM  Result Value Ref Range   HIV Screen 4th Generation wRfx Non Reactive Non Reactive    Comment: (NOTE) Performed At: [facility] [place], [place] [id number] Rush Farmer MD AQ:[id number] Performed at [facility], [address] [place],  [address]   Urine rapid drug screen (hosp performed)not at [facility]     Status: None   Collection Time: 04/22/17  3:00 AM  Result Value Ref Range   Opiates NONE DETECTED NONE DETECTED   Cocaine NONE DETECTED NONE DETECTED   Benzodiazepines NONE DETECTED NONE DETECTED   Amphetamines NONE DETECTED NONE DETECTED   Tetrahydrocannabinol NONE DETECTED NONE DETECTED   Barbiturates NONE DETECTED NONE DETECTED     Comment: (NOTE) DRUG SCREEN FOR MEDICAL PURPOSES ONLY.  IF CONFIRMATION IS NEEDED FOR ANY PURPOSE, NOTIFY LAB WITHIN 5 DAYS. LOWEST DETECTABLE LIMITS FOR URINE DRUG SCREEN Drug Class                     Cutoff (ng/mL) Amphetamine and metabolites  1000 Barbiturate and metabolites    200 Benzodiazepine                 865 Tricyclics and metabolites     300 Opiates and metabolites        300 Cocaine and metabolites        300 THC                            50 Performed at [facility], [address], [place], [place] [address]   Urinalysis, Routine w reflex microscopic     Status: Abnormal   Collection Time: 04/22/17  3:00 AM  Result Value Ref Range   Color, Urine AMBER (A) YELLOW    Comment: BIOCHEMICALS MAY BE AFFECTED BY COLOR   APPearance CLOUDY (A) CLEAR   Specific Gravity, Urine >1.046 (H) 1.005 - 1.030   pH 5.0 5.0 - 8.0   Glucose, UA NEGATIVE NEGATIVE mg/dL   Hgb urine dipstick LARGE (A) NEGATIVE   Bilirubin Urine SMALL (A) NEGATIVE   Ketones, ur NEGATIVE NEGATIVE mg/dL   Protein, ur 100 (A) NEGATIVE mg/dL   Nitrite NEGATIVE NEGATIVE   Leukocytes, UA MODERATE (A) NEGATIVE   RBC / HPF TOO NUMEROUS TO COUNT 0 - 5 RBC/hpf   WBC, UA TOO NUMEROUS TO COUNT 0 - 5 WBC/hpf   Bacteria, UA FEW (A) NONE SEEN   Squamous Epithelial / LPF 0-5 (A) NONE SEEN   WBC Clumps PRESENT     Comment: Performed at [facility], [address] [place], [place] [address]  Sodium, urine, random     Status: None   Collection Time: 04/22/17  3:00 AM  Result Value Ref Range   Sodium, Ur 25 mmol/L    Comment: Performed at [facility], [address] [place], [place] [address]  Creatinine, urine, random     Status: None   Collection Time: 04/22/17  3:00 AM  Result Value Ref Range   Creatinine, Urine 172.08 mg/dL    Comment: Performed at [facility], [address], [place], [place] [address]  Lipid panel     Status: Abnormal   Collection Time: 04/22/17  4:49 AM  Result Value Ref Range    Cholesterol 107 0 - 200 mg/dL   Triglycerides 160 (H) <150 mg/dL   HDL 30 (L) >40 mg/dL   Total CHOL/HDL Ratio 3.6 RATIO   VLDL 32 0 - 40 mg/dL   LDL Cholesterol 45 0 - 99 mg/dL    Comment:        Total Cholesterol/HDL:CHD Risk Coronary Heart Disease Risk Table                     Men   Women  1/2 Average Risk   3.4   3.3  Average Risk       5.0   4.4  2 X Average Risk   9.6   7.1  3 X Average Risk  23.4   11.0        Use the calculated Patient Ratio above and the CHD Risk Table to determine the patient's CHD Risk.        ATP III CLASSIFICATION (LDL):  <100     mg/dL   Optimal  100-129  mg/dL   Near or Above                    Optimal  130-159  mg/dL   Borderline  160-189  mg/dL   High  >190     mg/dL   Very High Performed at [facility] [place], [place] [address]   Basic metabolic panel     Status: Abnormal   Collection Time: 04/22/17  4:49 AM  Result Value Ref Range   Sodium 146 (H) 135 - 145 mmol/L   Potassium 4.4 3.5 - 5.1 mmol/L   Chloride 108 101 - 111 mmol/L   CO2 24 22 - 32 mmol/L   Glucose, Bld 69 65 - 99 mg/dL   BUN 39 (H) 6 - 20 mg/dL   Creatinine, Ser 2.69 (H) 0.44 - 1.00 mg/dL   Calcium 8.4 (L) 8.9 - 10.3 mg/dL   GFR calc non Af Amer 18 (L) >60 mL/min   GFR calc Af Amer 21 (L) >60 mL/min    Comment: (NOTE) The eGFR has been calculated using the CKD EPI equation. This calculation has not been validated in all clinical situations. eGFR's persistently <60 mL/min signify possible Chronic Kidney Disease.    Anion gap 14 5 - 15    Comment: Performed at [facility], [address] [place], [place] [address]  CBC     Status: Abnormal   Collection Time: 04/22/17  4:49 AM  Result Value Ref Range   WBC 19.0 (H) 4.0 - 10.5 K/uL   RBC 4.55 3.87 - 5.11 MIL/uL   Hemoglobin 13.3 12.0 - 15.0 g/dL   HCT 43.3 36.0 - 46.0 %   MCV 95.2 78.0 - 100.0 fL   MCH 29.2 26.0 - 34.0 pg   MCHC 30.7 30.0 - 36.0 g/dL   RDW 16.2 (H) 11.5 - 15.5 %   Platelets  50 (L) 150 - 400 K/uL    Comment: SPECIMEN CHECKED FOR CLOTS PLATELET COUNT CONFIRMED BY SMEAR GIANT PLATELETS SEEN Performed at [facility], [address], [place], [place] [address]   Culture, blood (routine x 2)     Status: None (Preliminary result)   Collection Time: 04/22/17  8:02 AM  Result Value Ref Range   Specimen Description BLOOD RIGHT HAND    Special Requests      BOTTLES DRAWN AEROBIC ONLY Blood Culture results may not be optimal due to an inadequate volume of blood received in culture bottles Performed at [facility], [address], [place], [place] [address]    Culture PENDING    Report Status PENDING   Culture, blood (routine x 2)     Status: None (Preliminary result)   Collection Time: 04/22/17  9:00 AM  Result Value Ref Range   Specimen Description BLOOD LEFT HAND    Special Requests      BOTTLES DRAWN AEROBIC AND ANAEROBIC Blood Culture adequate volume Performed at [facility], [address], [place], [place] [address]    Culture PENDING    Report Status PENDING   Lactic acid, plasma     Status: Abnormal   Collection Time: 04/22/17  9:00 AM  Result Value Ref Range   Lactic Acid, Venous 2.0 (HH) 0.5 - 1.9 mmol/L    Comment: CRITICAL RESULT CALLED TO, READ BACK BY AND VERIFIED WITH: [facility] AT 10:00AM ON 04/22/17 BY [facility] Performed at [facility], [address] [place], [place] [address]   Urinalysis, Routine w reflex microscopic     Status: Abnormal   Collection Time: 04/22/17  9:56 AM  Result Value Ref Range   Color, Urine BROWN (A) YELLOW    Comment: BIOCHEMICALS MAY BE AFFECTED BY COLOR  APPearance TURBID (A) CLEAR   Specific Gravity, Urine 1.025 1.005 - 1.030   pH 5.0 5.0 - 8.0   Glucose, UA NEGATIVE NEGATIVE mg/dL   Hgb urine dipstick LARGE (A) NEGATIVE   Bilirubin Urine MODERATE (A) NEGATIVE   Ketones, ur TRACE (A) NEGATIVE mg/dL   Protein, ur >300 (A) NEGATIVE mg/dL   Nitrite POSITIVE (A) NEGATIVE   Leukocytes, UA MODERATE (A)  NEGATIVE    Comment: Performed at [facility], [address] [place], [place] [address]  Urinalysis, Microscopic (reflex)     Status: Abnormal   Collection Time: 04/22/17  9:56 AM  Result Value Ref Range   RBC / HPF TOO NUMEROUS TO COUNT 0 - 5 RBC/hpf   WBC, UA TOO NUMEROUS TO COUNT 0 - 5 WBC/hpf   Bacteria, UA MANY (A) NONE SEEN   Squamous Epithelial / LPF 0-5 (A) NONE SEEN    Comment: Performed at [facility], [address], [place], [place] [address]    Studies/Results:  EEG 1.  Frequent right temporal epileptiform discharges.  However, no electrographic seizures are appreciated. 2.  Frequent frontal intermittent rhythmic delta activities.  These are typically seen in toxic metabolic encephalopathies/ processes. 3.  Mild global slowing indicating a mild global encephalopathy.   HEAD CT 1. No acute intracranial abnormality or significant interval change. 2. 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    HEAD NECK CTA - PERFUSION IMPRESSION: 1. No emergent large vessel occlusion. 2. 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 Diffuse attenuation of distal right MCA branch vessels with prolonged transit throughout the right hemisphere peripheral to the encephalomalacia, likely chronic. No definite acute core infarct based on motion degraded perfusion imaging. 3. No significant proximal intracranial arterial stenosis. 4. Widely patent cervical carotid arteries. 5. 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      Marya Lowden A. Merlene Laughter, M.D.  Diplomate, Tax adviser of Psychiatry and Neurology ( Neurology). 04/22/2017, 6:31 PM

## 2017-04-22 NOTE — ED Notes (Addendum)
Pt continues to call out random peoples names.

## 2017-04-22 NOTE — Progress Notes (Signed)
Patient just started EEG, tech said will be 45 minutes. Will attempt later today if possible if not in the am.  Leavy Cella, RDCS

## 2017-04-22 NOTE — Progress Notes (Signed)
EEG completed, results pending. 

## 2017-04-22 NOTE — ED Notes (Signed)
Pt calling out random names when people walk by.

## 2017-04-22 NOTE — Consult Note (Signed)
Reason for Consult: Possible acute kidney injury Referring Physician: Dr. Adolphus Birchwood is an 62 y.o. female.  HPI: She is a patient who has history of hypertension, CVA, seizure disorder and recurrent urinary tract infection was brought to the emergency room for possible seizure and slurred speech.  Patient was evaluated emergency room she was found to have hypotension and elevated BUN and creatinine.  Presently the information I received is a part of it is from her sister and apart from the patient.  She complains of having headache and some pain.  She denies any nausea or vomiting.  There is long-standing history of bilateral kidney stone but no history of renal failure.  Patient also denies any difficulty in breathing.  However patient has recurrent urinary tract infection the last couple of years.  Past Medical History:  Diagnosis Date  . Acute respiratory failure ([place]) 07/27/2012  . Arthritis    OSTEO LEFT LEG  . Chronic pain   . CVA (cerebral vascular accident) ([place])    left sided hemiparesis  . Depression   . Diabetes mellitus without complication ([place])   . Hemiparesis affecting left side as late effect of cerebrovascular accident ([place])   . Hypertension   . Metabolic encephalopathy 86/57/8469  . Narcotic overdose ([place]) 07/27/2012  . Respiratory failure ([place]) 05/06/10  . Seizure disorder ([place])   . Seizures ([place])   . Sleep apnea 04/2010   on CPAP, "severe central sleep apnea"  . Sleep apnea   . Spasticity    chronic  . Thrombocytopenia ([place])    related to depakote  . Thrombocytopenia, unspecified ([place]) 07/28/2012  . Unspecified hypothyroidism   . 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 UTI (lower urinary tract infection) 05/06/10    Past Surgical History:  Procedure Laterality Date  . CEREBRAL ANEURYSM REPAIR  1999  . INTRATHECAL PUMP IMPLANTATION  2010   [employer]:  fentanyl, baclofen changed to morphine and baclofen on 07/2012    No family history on file.  Social  History:  reports that  has never smoked. she has never used smokeless tobacco. She reports that she does not drink alcohol or use drugs.  Allergies:  Allergies  Allergen Reactions  . Codeine Other (See Comments)    unknown  . Lacosamide Other (See Comments)    Mood changes/agitation No rx noted    . Morphine Other (See Comments)    No rx noted    . Zolpidem     Medications: I have reviewed the patient's current medications.  Results for orders placed or performed during the hospital encounter of 04/21/17 (from the past 48 hour(s))  CBG monitoring, ED     Status: None   Collection Time: 04/21/17 10:54 AM  Result Value Ref Range   Glucose-Capillary 85 65 - 99 mg/dL  Ethanol     Status: None   Collection Time: 04/21/17 11:07 AM  Result Value Ref Range   Alcohol, Ethyl (B) <10 <10 mg/dL    Comment:        LOWEST DETECTABLE LIMIT FOR SERUM ALCOHOL IS 10 mg/dL FOR MEDICAL PURPOSES ONLY Performed at [facility], [address] [place], [place] [address]   Protime-INR     Status: Abnormal   Collection Time: 04/21/17 11:07 AM  Result Value Ref Range   Prothrombin Time 15.3 (H) 11.4 - 15.2 seconds   INR 1.22     Comment: Performed at [facility], [address] [place], [place] [address]  APTT  Status: Abnormal   Collection Time: 04/21/17 11:07 AM  Result Value Ref Range   aPTT 60 (H) 24 - 36 seconds    Comment:        IF BASELINE aPTT IS ELEVATED, SUGGEST PATIENT RISK ASSESSMENT BE USED TO DETERMINE APPROPRIATE ANTICOAGULANT THERAPY. Performed at [facility], [address], [place], [place] [address]   CBC     Status: Abnormal   Collection Time: 04/21/17 11:07 AM  Result Value Ref Range   WBC 12.6 (H) 4.0 - 10.5 K/uL   RBC 5.16 (H) 3.87 - 5.11 MIL/uL   Hemoglobin 15.3 (H) 12.0 - 15.0 g/dL   HCT 49.8 (H) 36.0 - 46.0 %   MCV 96.5 78.0 - 100.0 fL   MCH 29.7 26.0 - 34.0 pg   MCHC 30.7 30.0 - 36.0 g/dL   RDW 15.6 (H) 11.5 - 15.5 %   Platelets 58 (L) 150 - 400  K/uL    Comment: SPECIMEN CHECKED FOR CLOTS PLATELET COUNT CONFIRMED BY SMEAR Performed at [facility], [address], [place], [place] [address]   Differential     Status: None   Collection Time: 04/21/17 11:07 AM  Result Value Ref Range   Neutrophils Relative % 81 %   Neutro Abs 10.2 1.7 - 7.7 K/uL   Lymphocytes Relative 14 %   Lymphs Abs 1.7 0.7 - 4.0 K/uL   Monocytes Relative 5 %   Monocytes Absolute 0.6 0.1 - 1.0 K/uL   Eosinophils Relative 0 %   Eosinophils Absolute 0.1 0.0 - 0.7 K/uL   Basophils Relative 0 %   Basophils Absolute 0.0 0.0 - 0.1 K/uL   WBC Morphology ATYPICAL LYMPHOCYTES     Comment: MILD LEFT SHIFT (1-5% METAS, OCC MYELO, OCC BANDS) Performed at [facility], [address] [place], [place] [address]   Comprehensive metabolic panel     Status: Abnormal   Collection Time: 04/21/17 11:07 AM  Result Value Ref Range   Sodium 146 (H) 135 - 145 mmol/L   Potassium 4.4 3.5 - 5.1 mmol/L   Chloride 107 101 - 111 mmol/L   CO2 21 (L) 22 - 32 mmol/L   Glucose, Bld 94 65 - 99 mg/dL   BUN 28 (H) 6 - 20 mg/dL   Creatinine, Ser 2.04 (H) 0.44 - 1.00 mg/dL   Calcium 9.5 8.9 - 10.3 mg/dL   Total Protein 7.3 6.5 - 8.1 g/dL   Albumin 3.3 (L) 3.5 - 5.0 g/dL   AST 42 (H) 15 - 41 U/L   ALT 12 (L) 14 - 54 U/L   Alkaline Phosphatase 66 38 - 126 U/L   Total Bilirubin 0.9 0.3 - 1.2 mg/dL   GFR calc non Af Amer 25 (L) >60 mL/min   GFR calc Af Amer 29 (L) >60 mL/min    Comment: (NOTE) The eGFR has been calculated using the CKD EPI equation. This calculation has not been validated in all clinical situations. eGFR's persistently <60 mL/min signify possible Chronic Kidney Disease.    Anion gap 18 (H) 5 - 15    Comment: Performed at [facility], [address] [place], [place] [address]  Hemoglobin A1c     Status: Abnormal   Collection Time: 04/21/17 11:07 AM  Result Value Ref Range   Hgb A1c MFr Bld 6.3 (H) 4.8 - 5.6 %    Comment: (NOTE) Pre diabetes:           5.7%-6.4% Diabetes:              >  6.4% Glycemic control for   <7.0% adults with diabetes    Mean Plasma Glucose 134.11 mg/dL    Comment: Performed at [facility] [address], [place], [place] [address]  I-stat troponin, ED (not at [facility], [facility])     Status: None   Collection Time: 04/21/17 11:19 AM  Result Value Ref Range   Troponin i, poc 0.00 0.00 - 0.08 ng/mL   Comment 3            Comment: Due to the release kinetics of cTnI, a negative result within the first hours of the onset of symptoms does not rule out myocardial infarction with certainty. If myocardial infarction is still suspected, repeat the test at appropriate intervals.   I-Stat Chem 8, ED  (not at [facility], [facility])     Status: Abnormal   Collection Time: 04/21/17 11:21 AM  Result Value Ref Range   Sodium 146 (H) 135 - 145 mmol/L   Potassium 4.4 3.5 - 5.1 mmol/L   Chloride 109 101 - 111 mmol/L   BUN 26 (H) 6 - 20 mg/dL   Creatinine, Ser 2.00 (H) 0.44 - 1.00 mg/dL   Glucose, Bld 88 65 - 99 mg/dL   Calcium, Ion 1.10 (L) 1.15 - 1.40 mmol/L   TCO2 23 22 - 32 mmol/L   Hemoglobin 15.6 (H) 12.0 - 15.0 g/dL   HCT 46.0 36.0 - 46.0 %  Valproic acid level     Status: None   Collection Time: 04/21/17  5:15 PM  Result Value Ref Range   Valproic Acid Lvl 59 50.0 - 100.0 ug/mL    Comment: Performed at [facility], [address] [place], [place] [address]  HIV antibody (Routine Testing)     Status: None   Collection Time: 04/21/17  5:15 PM  Result Value Ref Range   HIV Screen 4th Generation wRfx Non Reactive Non Reactive    Comment: (NOTE) Performed At: [facility] [place], [place] [id number] Rush Farmer MD [id number] Performed at [facility], [address], [place], [place] [address]   Urine rapid drug screen (hosp performed)not at [facility]     Status: None   Collection Time: 04/22/17  3:00 AM  Result Value Ref Range   Opiates NONE DETECTED NONE DETECTED   Cocaine NONE DETECTED NONE  DETECTED   Benzodiazepines NONE DETECTED NONE DETECTED   Amphetamines NONE DETECTED NONE DETECTED   Tetrahydrocannabinol NONE DETECTED NONE DETECTED   Barbiturates NONE DETECTED NONE DETECTED    Comment: (NOTE) DRUG SCREEN FOR MEDICAL PURPOSES ONLY.  IF CONFIRMATION IS NEEDED FOR ANY PURPOSE, NOTIFY LAB WITHIN 5 DAYS. LOWEST DETECTABLE LIMITS FOR URINE DRUG SCREEN Drug Class                     Cutoff (ng/mL) Amphetamine and metabolites    1000 Barbiturate and metabolites    200 Benzodiazepine                 812 Tricyclics and metabolites     300 Opiates and metabolites        300 Cocaine and metabolites        300 THC                            50 Performed at [facility], [address], [place], [place] [address]   Urinalysis, Routine w reflex microscopic     Status: Abnormal  Collection Time: 04/22/17  3:00 AM  Result Value Ref Range   Color, Urine AMBER (A) YELLOW    Comment: BIOCHEMICALS MAY BE AFFECTED BY COLOR   APPearance CLOUDY (A) CLEAR   Specific Gravity, Urine >1.046 (H) 1.005 - 1.030   pH 5.0 5.0 - 8.0   Glucose, UA NEGATIVE NEGATIVE mg/dL   Hgb urine dipstick LARGE (A) NEGATIVE   Bilirubin Urine SMALL (A) NEGATIVE   Ketones, ur NEGATIVE NEGATIVE mg/dL   Protein, ur 100 (A) NEGATIVE mg/dL   Nitrite NEGATIVE NEGATIVE   Leukocytes, UA MODERATE (A) NEGATIVE   RBC / HPF TOO NUMEROUS TO COUNT 0 - 5 RBC/hpf   WBC, UA TOO NUMEROUS TO COUNT 0 - 5 WBC/hpf   Bacteria, UA FEW (A) NONE SEEN   Squamous Epithelial / LPF 0-5 (A) NONE SEEN   WBC Clumps PRESENT     Comment: Performed at [facility], [address] [place], [place] [address]  Sodium, urine, random     Status: None   Collection Time: 04/22/17  3:00 AM  Result Value Ref Range   Sodium, Ur 25 mmol/L    Comment: Performed at [facility], [address], [place], [place] [address]  Creatinine, urine, random     Status: None   Collection Time: 04/22/17  3:00 AM  Result Value Ref Range   Creatinine, Urine  172.08 mg/dL    Comment: Performed at [facility], [address], [place], [place] [address]  Lipid panel     Status: Abnormal   Collection Time: 04/22/17  4:49 AM  Result Value Ref Range   Cholesterol 107 0 - 200 mg/dL   Triglycerides 160 (H) <150 mg/dL   HDL 30 (L) >40 mg/dL   Total CHOL/HDL Ratio 3.6 RATIO   VLDL 32 0 - 40 mg/dL   LDL Cholesterol 45 0 - 99 mg/dL    Comment:        Total Cholesterol/HDL:CHD Risk Coronary Heart Disease Risk Table                     Men   Women  1/2 Average Risk   3.4   3.3  Average Risk       5.0   4.4  2 X Average Risk   9.6   7.1  3 X Average Risk  23.4   11.0        Use the calculated Patient Ratio above and the CHD Risk Table to determine the patient's CHD Risk.        ATP III CLASSIFICATION (LDL):  <100     mg/dL   Optimal  100-129  mg/dL   Near or Above                    Optimal  130-159  mg/dL   Borderline  160-189  mg/dL   High  >190     mg/dL   Very High Performed at [place] [place], [place] [address]   Basic metabolic panel     Status: Abnormal   Collection Time: 04/22/17  4:49 AM  Result Value Ref Range   Sodium 146 (H) 135 - 145 mmol/L   Potassium 4.4 3.5 - 5.1 mmol/L   Chloride 108 101 - 111 mmol/L   CO2 24 22 - 32 mmol/L   Glucose, Bld 69 65 - 99 mg/dL   BUN 39 (H) 6 - 20 mg/dL   Creatinine, Ser 2.69 (H) 0.44 -  1.00 mg/dL   Calcium 8.4 (L) 8.9 - 10.3 mg/dL   GFR calc non Af Amer 18 (L) >60 mL/min   GFR calc Af Amer 21 (L) >60 mL/min    Comment: (NOTE) The eGFR has been calculated using the CKD EPI equation. This calculation has not been validated in all clinical situations. eGFR's persistently <60 mL/min signify possible Chronic Kidney Disease.    Anion gap 14 5 - 15    Comment: Performed at [facility], [address], [place], [place] [address]  CBC     Status: Abnormal   Collection Time: 04/22/17  4:49 AM  Result Value Ref Range   WBC 19.0 (H) 4.0 - 10.5 K/uL   RBC 4.55 3.87 - 5.11  MIL/uL   Hemoglobin 13.3 12.0 - 15.0 g/dL   HCT 43.3 36.0 - 46.0 %   MCV 95.2 78.0 - 100.0 fL   MCH 29.2 26.0 - 34.0 pg   MCHC 30.7 30.0 - 36.0 g/dL   RDW 16.2 (H) 11.5 - 15.5 %   Platelets 50 (L) 150 - 400 K/uL    Comment: SPECIMEN CHECKED FOR CLOTS PLATELET COUNT CONFIRMED BY SMEAR GIANT PLATELETS SEEN Performed at [facility], [address] [place], [place] [address]   Culture, blood (routine x 2)     Status: None (Preliminary result)   Collection Time: 04/22/17  8:02 AM  Result Value Ref Range   Specimen Description BLOOD RIGHT HAND    Special Requests      BOTTLES DRAWN AEROBIC ONLY Blood Culture results may not be optimal due to an inadequate volume of blood received in culture bottles Performed at [facility], [address] [place], [place] [address]    Culture PENDING    Report Status PENDING   Culture, blood (routine x 2)     Status: None (Preliminary result)   Collection Time: 04/22/17  9:00 AM  Result Value Ref Range   Specimen Description BLOOD LEFT HAND    Special Requests      BOTTLES DRAWN AEROBIC AND ANAEROBIC Blood Culture adequate volume Performed at [facility], [address], [place], [place] [address]    Culture PENDING    Report Status PENDING   Lactic acid, plasma     Status: Abnormal   Collection Time: 04/22/17  9:00 AM  Result Value Ref Range   Lactic Acid, Venous 2.0 (HH) 0.5 - 1.9 mmol/L    Comment: CRITICAL RESULT CALLED TO, READ BACK BY AND VERIFIED WITH: Daisy Lazar AT 10:00AM ON 04/22/17 BY Derrill Memo Performed at [facility], [address], [place], [place] [address]   Urinalysis, Routine w reflex microscopic     Status: Abnormal   Collection Time: 04/22/17  9:56 AM  Result Value Ref Range   Color, Urine BROWN (A) YELLOW    Comment: BIOCHEMICALS MAY BE AFFECTED BY COLOR   APPearance TURBID (A) CLEAR   Specific Gravity, Urine 1.025 1.005 - 1.030   pH 5.0 5.0 - 8.0   Glucose, UA NEGATIVE NEGATIVE mg/dL   Hgb urine dipstick LARGE  (A) NEGATIVE   Bilirubin Urine MODERATE (A) NEGATIVE   Ketones, ur TRACE (A) NEGATIVE mg/dL   Protein, ur >300 (A) NEGATIVE mg/dL   Nitrite POSITIVE (A) NEGATIVE   Leukocytes, UA MODERATE (A) NEGATIVE    Comment: Performed at [facility], [address] [place], [place] [address]  Urinalysis, Microscopic (reflex)     Status: Abnormal   Collection Time: 04/22/17  9:56 AM  Result Value Ref  Range   RBC / HPF TOO NUMEROUS TO COUNT 0 - 5 RBC/hpf   WBC, UA TOO NUMEROUS TO COUNT 0 - 5 WBC/hpf   Bacteria, UA MANY (A) NONE SEEN   Squamous Epithelial / LPF 0-5 (A) NONE SEEN    Comment: Performed at [facility], [address] [place], [place] [address]    Ct Angio Head W Or Wo Contrast  Result Date: 04/21/2017 CLINICAL DATA:  Altered mental status.  Abnormal speech. EXAM: CT ANGIOGRAPHY HEAD AND NECK CT PERFUSION BRAIN TECHNIQUE: Multidetector CT imaging of the head and neck was performed using the standard protocol during bolus administration of intravenous contrast. Multiplanar CT image reconstructions and MIPs were obtained to evaluate the vascular anatomy. Carotid stenosis measurements (when applicable) are obtained utilizing NASCET criteria, using the distal internal carotid diameter as the denominator. Multiphase CT imaging of the brain was performed following IV bolus contrast injection. Subsequent parametric perfusion maps were calculated using RAPID software. CONTRAST:  23m ISOVUE-370 IOPAMIDOL (ISOVUE-370) INJECTION 76% COMPARISON:  Noncontrast head CT today. No prior angiographic imaging. FINDINGS: CTA NECK FINDINGS Aortic arch: Standard 3 vessel aortic arch with mild atherosclerotic plaque. Widely patent arch vessel origins. Right carotid system: Patent without evidence of stenosis or dissection. Left carotid system: Patent without evidence of stenosis or dissection. Vertebral arteries: Patent and codominant without evidence of dissection or hemodynamically significant stenosis. Possible mild  left vertebral artery origin stenosis, with motion artifact limiting assessment. Skeleton: Right anterior pterional craniotomy. Moderate cervical disc and facet degeneration. Other neck: No mass or enlarged lymph nodes. Upper chest: Motion artifact in the lung apices with posterior right upper lobe opacity likely reflecting atelectasis. Review of the MIP images confirms the above findings CTA HEAD FINDINGS Anterior circulation: The internal carotid arteries are widely patent from skull base to carotid termini. An aneurysm clip is present in the anterior communicating region with associated streak artifact. The ACAs are patent, with the right A1 segment being hypoplastic. No recurrent/residual aneurysm is identified. The MCAs are patent without evidence of proximal branch occlusion or significant proximal stenosis. There is diffuse asymmetric attenuation of distal branch vessels throughout the right MCA territory. Posterior circulation: The intracranial vertebral arteries are widely patent to the basilar. Dominant right PICA and left AICA. Patent SCAs. The basilar artery is widely patent. There is a patent right posterior communicating artery, and the right P1 segment appears hypoplastic. The PCAs are patent without evidence of significant stenosis. No aneurysm. Venous sinuses: Patent. Anatomic variants: Hypoplastic right A1 and right P1 segments. Delayed phase: No abnormal enhancement. Review of the MIP images confirms the above findings CT Brain Perfusion Findings: There is prominent motion artifact in the mid to latter part of the acquisition. CBF (<30%) Volume: 0 mL Perfusion (Tmax>6.0s) volume: 136 mL, however this includes portions of the posterior fossa and posterior left cerebral hemisphere which are favored to be artifactual. The majority of the prolonged transit involves the right cerebral hemisphere peripheral to the large area of chronic encephalomalacia. Infarction Location: None based on a threshold of  CBF <30% of the contralateral side, although there is focal less severely reduced CBF (<38%) in the high right frontoparietal region IMPRESSION: 1. No emergent large vessel occlusion. 2. 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 Diffuse attenuation of distal right MCA branch vessels with prolonged transit throughout the right hemisphere peripheral to the encephalomalacia, likely chronic. No definite acute core infarct based on motion degraded  perfusion imaging. 3. No significant proximal intracranial arterial stenosis. 4. Widely patent cervical carotid arteries. 5. 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 These results were called by telephone at the time of interpretation on 04/21/2017 at 12:36 pm to Dr. Thurnell [place], who verbally acknowledged these results. Electronically Signed   By: Logan Bores M.D.   On: 04/21/2017 13:09   Ct Angio Neck W Or Wo Contrast  Result Date: 04/21/2017 CLINICAL DATA:  Altered mental status.  Abnormal speech. EXAM: CT ANGIOGRAPHY HEAD AND NECK CT PERFUSION BRAIN TECHNIQUE: Multidetector CT imaging of the head and neck was performed using the standard protocol during bolus administration of intravenous contrast. Multiplanar CT image reconstructions and MIPs were obtained to evaluate the vascular anatomy. Carotid stenosis measurements (when applicable) are obtained utilizing NASCET criteria, using the distal internal carotid diameter as the denominator. Multiphase CT imaging of the brain was performed following IV bolus contrast injection. Subsequent parametric perfusion maps were calculated using RAPID software. CONTRAST:  34m ISOVUE-370 IOPAMIDOL (ISOVUE-370) INJECTION 76% COMPARISON:  Noncontrast head CT today. No prior angiographic imaging. FINDINGS: CTA NECK FINDINGS Aortic arch: Standard 3 vessel aortic arch with mild atherosclerotic plaque. Widely patent arch vessel origins. Right carotid  system: Patent without evidence of stenosis or dissection. Left carotid system: Patent without evidence of stenosis or dissection. Vertebral arteries: Patent and codominant without evidence of dissection or hemodynamically significant stenosis. Possible mild left vertebral artery origin stenosis, with motion artifact limiting assessment. Skeleton: Right anterior pterional craniotomy. Moderate cervical disc and facet degeneration. Other neck: No mass or enlarged lymph nodes. Upper chest: Motion artifact in the lung apices with posterior right upper lobe opacity likely reflecting atelectasis. Review of the MIP images confirms the above findings CTA HEAD FINDINGS Anterior circulation: The internal carotid arteries are widely patent from skull base to carotid termini. An aneurysm clip is present in the anterior communicating region with associated streak artifact. The ACAs are patent, with the right A1 segment being hypoplastic. No recurrent/residual aneurysm is identified. The MCAs are patent without evidence of proximal branch occlusion or significant proximal stenosis. There is diffuse asymmetric attenuation of distal branch vessels throughout the right MCA territory. Posterior circulation: The intracranial vertebral arteries are widely patent to the basilar. Dominant right PICA and left AICA. Patent SCAs. The basilar artery is widely patent. There is a patent right posterior communicating artery, and the right P1 segment appears hypoplastic. The PCAs are patent without evidence of significant stenosis. No aneurysm. Venous sinuses: Patent. Anatomic variants: Hypoplastic right A1 and right P1 segments. Delayed phase: No abnormal enhancement. Review of the MIP images confirms the above findings CT Brain Perfusion Findings: There is prominent motion artifact in the mid to latter part of the acquisition. CBF (<30%) Volume: 0 mL Perfusion (Tmax>6.0s) volume: 136 mL, however this includes portions of the posterior fossa  and posterior left cerebral hemisphere which are favored to be artifactual. The majority of the prolonged transit involves the right cerebral hemisphere peripheral to the large area of chronic encephalomalacia. Infarction Location: None based on a threshold of CBF <30% of the contralateral side, although there is focal less severely reduced CBF (<38%) in the high right frontoparietal region IMPRESSION: 1. No emergent large vessel occlusion. 2. 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 Diffuse attenuation of distal right MCA branch vessels with prolonged transit throughout the right hemisphere peripheral to the encephalomalacia, likely chronic. No definite acute core infarct based on  motion degraded perfusion imaging. 3. No significant proximal intracranial arterial stenosis. 4. Widely patent cervical carotid arteries. 5. 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 These results were called by telephone at the time of interpretation on 04/21/2017 at 12:36 pm to Dr. Thurnell [place], who verbally acknowledged these results. Electronically Signed   By: Logan Bores M.D.   On: 04/21/2017 13:09   US Renal  Result Date: 04/22/2017 CLINICAL DATA:  Acute renal injury. EXAM: RENAL / URINARY TRACT ULTRASOUND COMPLETE COMPARISON:  Ultrasound 04/21/2017. FINDINGS: Right Kidney: Length: 10.2 cm. Echogenicity within normal limits. No mass. Mild hydronephrosis. 11 mm renal pelvic stone. Left Kidney: Length: 10.6 cm. Echogenicity within normal limits. No mass or hydronephrosis visualized. Limited evaluation due to overlying bowel gas, patient's body habitus, and positioning difficulty. Bladder: Bladder is nondistended. By history the patient has a Foley catheter. IMPRESSION: 1. 11 mm right renal pelvic stone. Mild hydronephrosis. Stable exam. No change from prior study of 04/21/2017. 2. Limited evaluation of the left kidney due to overlying bowel gas,  patient's body habitus, and positioning difficulty. Electronically Signed   By: Marcello Moores  Register   On: 04/22/2017 10:24   US Renal  Result Date: 04/21/2017 CLINICAL DATA:  Acute kidney injury. EXAM: RENAL / URINARY TRACT ULTRASOUND COMPLETE COMPARISON:  CT scan of the abdomen dated 01/06/2013 FINDINGS: Right Kidney: Length: 10.5 cm. 11 mm stone right renal pelvis. Minimal prominence of the calices. Renal pelvis is not distended. Echogenicity within normal limits. No mass visualized. Left Kidney: Length: 11.7 cm. Small stones in the lower pole. No hydronephrosis. Echogenicity within normal limits. No mass or hydronephrosis visualized. Bladder: Appears normal for degree of bladder distention. IMPRESSION: 1. Chronic bilateral renal calculi. 2. Minimal dilatation of the calices of the right kidney without dilatation of the renal pelvis or ureter. Electronically Signed   By: Lorriane Shire M.D.   On: 04/21/2017 17:26   Ct Cerebral Perfusion W Contrast  Result Date: 04/21/2017 CLINICAL DATA:  Altered mental status.  Abnormal speech. EXAM: CT ANGIOGRAPHY HEAD AND NECK CT PERFUSION BRAIN TECHNIQUE: Multidetector CT imaging of the head and neck was performed using the standard protocol during bolus administration of intravenous contrast. Multiplanar CT image reconstructions and MIPs were obtained to evaluate the vascular anatomy. Carotid stenosis measurements (when applicable) are obtained utilizing NASCET criteria, using the distal internal carotid diameter as the denominator. Multiphase CT imaging of the brain was performed following IV bolus contrast injection. Subsequent parametric perfusion maps were calculated using RAPID software. CONTRAST:  85m ISOVUE-370 IOPAMIDOL (ISOVUE-370) INJECTION 76% COMPARISON:  Noncontrast head CT today. No prior angiographic imaging. FINDINGS: CTA NECK FINDINGS Aortic arch: Standard 3 vessel aortic arch with mild atherosclerotic plaque. Widely patent arch vessel origins. Right  carotid system: Patent without evidence of stenosis or dissection. Left carotid system: Patent without evidence of stenosis or dissection. Vertebral arteries: Patent and codominant without evidence of dissection or hemodynamically significant stenosis. Possible mild left vertebral artery origin stenosis, with motion artifact limiting assessment. Skeleton: Right anterior pterional craniotomy. Moderate cervical disc and facet degeneration. Other neck: No mass or enlarged lymph nodes. Upper chest: Motion artifact in the lung apices with posterior right upper lobe opacity likely reflecting atelectasis. Review of the MIP images confirms the above findings CTA HEAD FINDINGS Anterior circulation: The internal carotid arteries are widely patent from skull base to carotid termini. An aneurysm clip is present in the anterior communicating region with associated streak artifact. The ACAs are patent, with the right A1 segment being  hypoplastic. No recurrent/residual aneurysm is identified. The MCAs are patent without evidence of proximal branch occlusion or significant proximal stenosis. There is diffuse asymmetric attenuation of distal branch vessels throughout the right MCA territory. Posterior circulation: The intracranial vertebral arteries are widely patent to the basilar. Dominant right PICA and left AICA. Patent SCAs. The basilar artery is widely patent. There is a patent right posterior communicating artery, and the right P1 segment appears hypoplastic. The PCAs are patent without evidence of significant stenosis. No aneurysm. Venous sinuses: Patent. Anatomic variants: Hypoplastic right A1 and right P1 segments. Delayed phase: No abnormal enhancement. Review of the MIP images confirms the above findings CT Brain Perfusion Findings: There is prominent motion artifact in the mid to latter part of the acquisition. CBF (<30%) Volume: 0 mL Perfusion (Tmax>6.0s) volume: 136 mL, however this includes portions of the posterior  fossa and posterior left cerebral hemisphere which are favored to be artifactual. The majority of the prolonged transit involves the right cerebral hemisphere peripheral to the large area of chronic encephalomalacia. Infarction Location: None based on a threshold of CBF <30% of the contralateral side, although there is focal less severely reduced CBF (<38%) in the high right frontoparietal region IMPRESSION: 1. No emergent large vessel occlusion. 2. 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 Diffuse attenuation of distal right MCA branch vessels with prolonged transit throughout the right hemisphere peripheral to the encephalomalacia, likely chronic. No definite acute core infarct based on motion degraded perfusion imaging. 3. No significant proximal intracranial arterial stenosis. 4. Widely patent cervical carotid arteries. 5. 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 These results were called by telephone at the time of interpretation on 04/21/2017 at 12:36 pm to Dr. Thurnell Garbe, who verbally acknowledged these results. Electronically Signed   By: Logan Bores M.D.   On: 04/21/2017 13:09   Dg Chest Port 1 View  Result Date: 04/21/2017 CLINICAL DATA:  Slurred speech. EXAM: PORTABLE CHEST 1 VIEW COMPARISON:  02/06/2015 FINDINGS: The heart size and pulmonary vascularity are normal and the lungs are clear. No acute bone abnormality. IMPRESSION: No active disease. Electronically Signed   By: Lorriane Shire M.D.   On: 04/21/2017 11:42   Ct Head Code Stroke Wo Contrast  Result Date: 04/21/2017 CLINICAL DATA:  Code stroke.  Code stroke.  Abnormal speech. EXAM: CT HEAD WITHOUT CONTRAST TECHNIQUE: Contiguous axial images were obtained from the base of the skull through the vertex without intravenous contrast. COMPARISON:  CT head without contrast 09/16/2012 FINDINGS: Brain: Remote right MCA territory encephalomalacia is stable. There  is ex vacuo dilation of the right lateral ventricle. Right paraophthalmic aneurysm clip is noted. Left frontal encephalomalacia is stable. Density within the left frontal white matter is stable. 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 No acute infarct is present. Left insular ribbon is normal. No focal cortical abnormality is present. Vascular: Ventricles are of normal size. Ex vacuo dilation of the right lateral ventricle is stable. Skull: Calvarium is intact. No focal lytic or blastic lesions are present. Sinuses/Orbits: The paranasal sinuses and mastoid air cells are clear. Globes and orbits are unremarkable. ASPECTS [facility] Stroke Program Early CT Score) - Ganglionic level infarction (caudate, lentiform nuclei, internal capsule, insula, M1-M3 cortex): 7/7 - Supraganglionic infarction (M4-M6 cortex): 3/3 Total score (0-10 with 10 being normal): 10/10 IMPRESSION: 1. No acute intracranial abnormality or significant interval change. 2. Stable chronic encephalomalacia of the  right MCA territory and anterior left frontal lobe. 3. Right ICA aneurysm clip. 4. ASPECTS is 10/10 These results were called by telephone at the time of interpretation on 04/21/2017 at 10:56 am to Dr. Francine Graven , who verbally acknowledged these results. Electronically Signed   By: San Morelle M.D.   On: 04/21/2017 11:02    Review of Systems  Constitutional: Positive for chills. Negative for fever.  HENT:       Patient complains of headache.  Respiratory: Negative for shortness of breath.   Cardiovascular: Negative for chest pain and orthopnea.  Gastrointestinal: Negative for nausea and vomiting.  Genitourinary: Positive for flank pain.   Blood pressure 94/64, pulse 90, temperature 100.2 F (37.9 C), temperature source Rectal, resp. rate (!) 32, weight 97.5 kg (215 lb), SpO2 95 %. Physical Exam  Constitutional: No distress.  Neck: No JVD present.  Cardiovascular: Normal rate  and regular rhythm.  No murmur heard. Respiratory: No respiratory distress. She has no wheezes.  GI: She exhibits no distension. There is no tenderness.  Musculoskeletal: She exhibits no edema.    Assessment/Plan: 1] possible acute kidney injury as her last creatinine was 0.44 about 4 years ago.  There is no blood work since then.  Presently her creatinine is progressively worsening.  In a patient with hypernatremia, high urine specific gravity and hypotension most likely we may be dealing with prerenal syndrome.  However ATN and also use of ARB's contribute to the initial insult.  The increase in her creatinine from 2.04-2.69 the last 24 hours could also be attributed to diet induced acute kidney injury superimposed on prerenal/ATN.  Patient at this moment seems to be oliguric. 2] hypotension: Possibly from urinary tract infection.  Patient blood pressure is somewhat better. 3] history of CVA with left-sided hemiparesis 4] history of sleep apnea 5] history of seizure disorder: According to her sister patient did not have any recent seizure activity. 6] hypotension: Possibly a combination of dehydration and antihypertensive medications.  As stated above her blood pressure seems to be slowly improving. 7] obesity 8] bilateral kidney stone.  Patient has bilateral kidney stone.  She has mild right hydronephrosis otherwise there is no significant change from before. 9] hyper natremia: Most likely from lack of free water intake. Plan: 1] we will DC normal saline 2] we will start her on half normal saline at 135 cc/h 3] we will start on Lasix 40 mg IV twice daily to improve her urine output 4] we will repeat UA in the morning to see once the hematuria clears whether patient has proteinuria. 5] will check renal panel in the morning.  Benzion Mesta S 04/22/2017, 2:57 PM

## 2017-04-22 NOTE — Progress Notes (Signed)
  Pt is stable in PACU -- BP 99/70, HR 106, RR 15. Extubated. I called and discussed with her husband Mr. Bramlett. We discussed importance of f/u with Dr. Diona Fanti in a couple of weeks to plan stent/stone removal as the stent is temporary and must be removed.   Her husband is [name] -- [phone]

## 2017-04-22 NOTE — Therapy (Signed)
Clinical/Bedside Swallow Evaluation Patient Details  Name: [name] MRN: [id number] Date of Birth: 1955-09-07  Today's Date: 04/22/2017 Time: SLP Start Time (ACUTE ONLY): 1623 SLP Stop Time (ACUTE ONLY): 1443 SLP Time Calculation (min) (ACUTE ONLY): 11 min  Past Medical History:  Past Medical History:  Diagnosis Date  . Acute respiratory failure ([place]) 07/27/2012  . Arthritis    OSTEO LEFT LEG  . Chronic pain   . CVA (cerebral vascular accident) ([place])    left sided hemiparesis  . Depression   . Diabetes mellitus without complication ([place])   . Hemiparesis affecting left side as late effect of cerebrovascular accident ([place])   . Hypertension   . Metabolic encephalopathy 15/40/0867  . Narcotic overdose ([place]) 07/27/2012  . Respiratory failure ([place]) 05/06/10  . Seizure disorder ([place])   . Seizures ([place])   . Sleep apnea 04/2010   on CPAP, "severe central sleep apnea"  . Sleep apnea   . Spasticity    chronic  . 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 Thrombocytopenia, unspecified ([place]) 07/28/2012  . Unspecified hypothyroidism   . 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 UTI (lower urinary tract infection) 05/06/10   Past Surgical History:  Past Surgical History:  Procedure Laterality Date  . CEREBRAL ANEURYSM REPAIR  1999  . INTRATHECAL PUMP IMPLANTATION  2010   [employer]:  fentanyl, baclofen changed to morphine and baclofen on 07/2012   HPI:  62 y.o. 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  She apparently was last known well at 830 this morning during which time she was eating a banana.  She was checked on again at approximately 9:30 AM of slurred speech of acute onset CT head indicated on 04/21/17 1. No acute  intracranial abnormality or significant interval change. 2. Stable chronic encephalomalacia of the right MCA territory and anterior left frontal lobe.3. Right ICA aneurysm clip.4. ASPECTS is 10/10 CXR on 04/21/17 negative for acute process.  Assessment / Plan / Recommendation Clinical Impression   Pt with significant decreased mentation with oral holding, decreased awareness of bolus, decreased lingual/oral manipulation/propulsion and eventual expulsion of ice chips from oral cavity d/t limited oral movement despite max verbal/tactile cues provided by SLP; recommend NPO d/t severe risk for aspiration d/t decreased mentation/limited BSE; will re-assess as able and/or pt mentation improves; thank you for this consult. SLP Visit Diagnosis: Dysphagia, unspecified (R13.10)    Aspiration Risk  Moderate aspiration risk;Risk for inadequate nutrition/hydration    Diet Recommendation   NPO  Medication Administration: Via alternative means    Other  Recommendations Oral Care Recommendations: Oral care QID   Follow up Recommendations Skilled Nursing facility      Frequency and Duration min 2x/week  1 week       Prognosis Prognosis for Safe Diet Advancement: Fair Barriers to Reach Goals: Other (Comment)(LOA)      Swallow Study   General Date of Onset: 04/21/17 HPI: 62 y.o. 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  She apparently was last known well at 830 this morning during which time she was eating a banana.  She was checked  on again at approximately 9:30 AM of slurred speech of acute onset Type of Study: Bedside Swallow Evaluation Previous Swallow Assessment: NSSS failed Diet Prior to this Study: NPO Temperature Spikes  Noted: No Respiratory Status: Nasal cannula History of Recent Intubation: No Behavior/Cognition: Lethargic/Drowsy Oral Cavity Assessment: Dry;Other (comment)(DTA) Oral Care Completed by SLP: Recent completion by staff Oral Cavity - Dentition: Missing dentition Self-Feeding Abilities: Needs assist;Needs set up Patient Positioning: Upright in bed Baseline Vocal Quality: Not observed Volitional Cough: Cognitively unable to elicit Volitional Swallow: Unable to elicit    Oral/Motor/Sensory Function Overall Oral Motor/Sensory Function: Other (comment)(UTA)   Ice Chips Ice chips: Impaired Presentation: Spoon Oral Phase Impairments: Reduced lingual movement/coordination;Poor awareness of bolus Oral Phase Functional Implications: Oral holding Pharyngeal Phase Impairments: Unable to trigger swallow Other Comments: Expelled from oral cavity   Thin Liquid Thin Liquid: Not tested    Nectar Thick Nectar Thick Liquid: Not tested   Honey Thick Honey Thick Liquid: Not tested   Puree Puree: Not tested   Solid      Solid: Not tested        Elvina Sidle, M.S., CCC-SLP 04/22/2017,4:49 PM

## 2017-04-22 NOTE — Op Note (Addendum)
Preoperative diagnosis: Right renal pelvic stone, right hydronephrosis, sepsis, UTI, left renal stones Postoperative diagnosis: Same  Procedure: Cystoscopy with bilateral retrograde pyelogram, right ureteral stent placement  Surgeon: Junious Silk  Anesthesia: General  Indication for procedure: 62 year old with a known 13 mm right renal pelvic stone, she developed more hydronephrosis on the right, acute mental status changes, septic picture with UTI and acute kidney injury.  She was brought for urgent stent.  She had retained contrast as well on the left, but no hydronephrosis.  Findings: The introitus, vagina and meatus appeared normal.  On cystoscopy the urethra was normal.  The ureteral orifice ease were in the normal orthotopic position but small.  The left ureteral orifice was quite small.  There was debris in the bladder.  No mucosal lesions.  No stones in the bladder.  No foreign body in the bladder.  On scout imaging there was right greater than left retention of contrast in the collecting system.  The left had just about washed out.  Right retrograde pyelogram-this outlined a single ureter single collecting system unit with a large stone filling the renal pelvis dilation of the calyces around the stone.  Left retrograde pyelogram-this outlined a single ureter single collecting system unit.  I could only get contrast about halfway up the ureter.  Patient became tachypneic and given there was no hydronephrotic findings on prior ultrasound and CT we opted to wake the patient and get her out of the operating room.  Description of procedure: After consent was obtained the patient brought to the operating room.  After adequate anesthesia she was placed in lithotomy position and prepped and draped in the usual sterile fashion.  A timeout was performed to confirm the patient and procedure.  The cystoscope was passed per urethra and the bladder drained and irrigated.  The right ureteral orifice was  cannulated with a 6 [place] open-ended catheter and retrograde injection of contrast was performed.  I then advanced a sensor wire and coiled this in the collecting system and copious amounts of debris laden possibly purulent urine drained.  I then passed a 6 x 24 cm ureteral stent over the wire.  The wire was removed with a good coil in the collecting system proximal to the stone and a good coil in the bladder.  The stent was draining well and she continued to drain debris laden urine from the side.  Attention was turned to the left ureteral orifice.  It was about 3 [place].  We could not access it with the 6 [place] and just got the cone-tipped catheter and it and injected contrast about halfway up the left ureter.  She became more tachypneic and with each respiration was moving the bladder making it difficult to stay on the ureteral orifice.  We tried several more times and could not get more contrast so we tried the 5 French catheter but could never cannulate the ureteral orifice on multiple attempts.  We then tried to pass the sensor wire into the ureter to pass the catheter over the wire but again could not.  The contrast I had gotten up had already drained out and it was thought it was best to wait the patient and get her to recovery.  The scope was backed out with the right stent visibly with a good coil in the bladder.  A 16 French Foley catheter was placed and left to gravity drainage.  Final fluoroscopy image of the right coil of the right stent was in a good  position up above the stone.  She was awakened and taken to the recovery room in stable condition.  Complications: None  Blood loss: Minimal  Specimens: None  Drains: 6 x 24 cm right ureteral stent  Disposition: Patient stable to PACU --Dr. Kalman Shan with anesthesia does not want the patient back on the ambulance to go to [facility].  Will admit to hospitalist here at [facility] in stepdown. Discussed with hospitalist and appreciate their care.

## 2017-04-22 NOTE — Procedures (Signed)
Rita A. Merlene Laughter, MD     [email or web address]           HISTORY: This is a 62 year old female who has a history of seizures.  She presents with worsening dysarthria and encephalopathy worrisome for possible seizures.  MEDICATIONS: Scheduled Meds: . divalproex  125 mg Oral BID  . divalproex  500 mg Oral BID  . DULoxetine  20 mg Oral Daily  . furosemide  40 mg Intravenous BID  . levETIRAcetam  1,500 mg Oral QHS  . levothyroxine  100 mcg Oral QAC breakfast  . loratadine  10 mg Oral Daily  . nystatin  1 g Topical BID  . nystatin cream  1 application Topical BID  . olopatadine  1 drop Both Eyes BID  . pantoprazole  40 mg Oral Daily  . polyethylene glycol  17 g Oral Daily  . rosuvastatin  5 mg Oral QHS   Continuous Infusions: . sodium chloride 135 mL/hr at 04/22/17 1703  . piperacillin-tazobactam (ZOSYN)  IV     PRN Meds:.acetaminophen **OR** acetaminophen (TYLENOL) oral liquid 160 mg/5 mL **OR** acetaminophen, fentaNYL (SUBLIMAZE) injection  Prior to Admission medications   Medication Sig Start Date End Date Taking? Authorizing Provider  acetaminophen (TYLENOL) 160 MG/5ML suspension Take 960 mg by mouth every 6 (six) hours as needed for moderate pain or fever.   Yes [provider]  amLODipine (NORVASC) 5 MG tablet Take 5 mg by mouth daily.   Yes [provider]  aspirin EC 81 MG tablet Take 81 mg by mouth daily.   Yes [provider]  cloNIDine (CATAPRES) 0.1 MG tablet Take 0.1 mg by mouth every 6 (six) hours as needed (for SBP greater than 160).   Yes [provider]  divalproex (DEPAKOTE) 125 MG DR tablet Take 125 mg by mouth 2 (two) times daily.   Yes [provider]  divalproex (DEPAKOTE) 500 MG DR tablet Take 500 mg by mouth 2 (two) times daily.   Yes [provider]  DULoxetine (CYMBALTA) 20 MG capsule Take 20 mg by mouth daily.   Yes [provider]  labetalol (NORMODYNE) 200 MG tablet  Take 200 mg by mouth 2 (two) times daily.   Yes [provider]  lactulose, encephalopathy, (CHRONULAC) 10 GM/15ML SOLN Take 20 g by mouth daily as needed.   Yes [provider]  levETIRAcetam (KEPPRA) 1000 MG tablet Take 1,000 mg by mouth daily.   Yes [provider]  levETIRAcetam (KEPPRA) 500 MG tablet Take 1,500 mg by mouth at bedtime.   Yes [provider]  levothyroxine (SYNTHROID, LEVOTHROID) 100 MCG tablet Take 100 mcg by mouth daily before breakfast.   Yes [provider]  lidocaine (LIDODERM) 5 % Place 1 patch onto the skin daily. Remove & Discard patch within 12 hours or as directed by MD   Yes [provider]  loratadine (CLARITIN) 10 MG tablet Take 10 mg by mouth daily.   Yes [provider]  losartan (COZAAR) 50 MG tablet Take 50 mg by mouth at bedtime.   Yes [provider]  Menthol, Topical Analgesic, 5 % GEL Apply 1 application topically 4 (four) times daily as needed.   Yes [provider]  Multiple Vitamin (MULTIVITAMIN WITH MINERALS) TABS tablet Take 1 tablet by mouth daily.   Yes [provider]  nystatin (NYSTATIN) powder Apply 1 g topically 2 (two) times daily.   Yes [provider]  nystatin cream (MYCOSTATIN) Apply 1 application topically  2 (two) times daily.   Yes [provider]  Olopatadine HCl (PATADAY) 0.2 % SOLN Place 1 drop into both eyes at bedtime.   Yes [provider]  omeprazole (PRILOSEC) 20 MG capsule Take 20 mg by mouth daily.   Yes [provider]  oxyCODONE (OXY IR/ROXICODONE) 5 MG immediate release tablet Take 5 mg by mouth every 8 (eight) hours as needed for pain (for moderate to severe pain).   Yes [provider]  Polyethyl Glycol-Propyl Glycol (SYSTANE) 0.4-0.3 % SOLN Place 1 drop into both eyes 3 (three) times daily as needed.   Yes [provider]  polyethylene glycol (MIRALAX / GLYCOLAX) packet Take 17 g by  mouth daily.   Yes [provider]  rosuvastatin (CRESTOR) 5 MG tablet Take 5 mg by mouth at bedtime.   Yes [provider]  traZODone (DESYREL) 50 MG tablet Take 50 mg by mouth at bedtime.   Yes [provider]  Vitamin D, Ergocalciferol, (DRISDOL) 50000 units CAPS capsule Take 50,000 Units by mouth every 30 (thirty) days.   Yes [provider]  DULoxetine (CYMBALTA) 60 MG capsule Take 60 mg by mouth daily.    [provider]      ANALYSIS: A 16 channel recording using standard 10 20 measurements is conducted for 21 minutes.   The background activity is slow in general with the maximum being 6.5-7 Hertz.   There is frequent frontal intermittent rhythmic delta activity is observed.  Awake and drowsy activities of observed.  Photic stimulation and hyperventilation were not conducted.  There are multiple episodes of sharp wave activity that is phase reverses at T4.  There seen almost every epoch.  No clear electrographic seizure are the observed however.   There are couple episodes of shaking involving the right hand but not associated with electrographic correlates.   IMPRESSION:   This recording is abnormal for the following reasons: 1.  Frequent right temporal epileptiform discharges.  However, no electrographic seizures are appreciated. 2.  Frequent frontal intermittent rhythmic delta activities.  These are typically seen in toxic metabolic encephalopathies/ processes. 3.  Mild global slowing indicating a mild global encephalopathy.      Adrick Kestler A. Merlene [name], M.D.  Diplomate, Tax adviser of Psychiatry and Neurology ( Neurology).

## 2017-04-22 NOTE — H&P (Addendum)
Consult: Sepsis, right hydronephrosis Requested by: Dr. Heath Lark  History of Present Illness: [name] is a 62 year old African-American female with a history of stroke and kidney stones.  She is followed by Dr. Diona Fanti at [facility] clinic for  a 13 mm right renal pelvic stone.  She had acute mental status changes yesterday and a head CT with IV contrast was stable without new stroke changes.  She had a temperature today of 100.3, tachycardia and soft blood pressures.  She was responded to fluid boluses.  Her urine showed many bacteria too numerous to count white cells and too numerous to count red cells.  She has a history of urinary tract infection.  Renal ultrasound showed mild right hydronephrosis and renal pelvic stone.  Follow-up CT scan today was obtained which showed right hydronephrosis, renal pelvic stone, bilateral abnormal delayed nephrograms compatible with acute renal injury, heterogeneity of the left kidney parenchyma possibly consistent with pyelonephritis.  I discussed continued surveillance with hospitalist, nephrostomy tube with interventional and given her size and degree of hydronephrosis it was thought ureteral stenting was safer and would allow for follow-up ureteroscopy. There were no ureteral stones on the CT.   Past Medical History:  Diagnosis Date  . Acute respiratory failure ([place]) 07/27/2012  . Arthritis    OSTEO LEFT LEG  . Chronic pain   . CVA (cerebral vascular accident) ([employer])    left sided hemiparesis  . Depression   . Diabetes mellitus without complication ([place])   . Hemiparesis affecting left side as late effect of cerebrovascular accident ([place])   . Hypertension   . Metabolic encephalopathy 15/40/0867  . Narcotic overdose ([place]) 07/27/2012  . Respiratory failure ([place]) 05/06/10  . Seizure disorder ([place])   . Seizures ([place])   . Sleep apnea 04/2010   on CPAP, "severe central sleep apnea"  . Sleep apnea   . Spasticity    chronic  . Thrombocytopenia ([place])     related to depakote  . Thrombocytopenia, unspecified ([place]) 07/28/2012  . Unspecified hypothyroidism   . 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 UTI (lower urinary tract infection) 05/06/10   Past Surgical History:  Procedure Laterality Date  . CEREBRAL ANEURYSM REPAIR  1999  . INTRATHECAL PUMP IMPLANTATION  2010   [employer]:  fentanyl, baclofen changed to morphine and baclofen on 07/2012    Home Medications:  Medications Prior to Admission  Medication Sig Dispense Refill Last Dose  . acetaminophen (TYLENOL) 160 MG/5ML suspension Take 960 mg by mouth every 6 (six) hours as needed for moderate pain or fever.   04/14/2017 at 1358  . amLODipine (NORVASC) 5 MG tablet Take 5 mg by mouth daily.   04/21/2017 at 1000  . aspirin EC 81 MG tablet Take 81 mg by mouth daily.   04/21/2017 at 0900  . cloNIDine (CATAPRES) 0.1 MG tablet Take 0.1 mg by mouth every 6 (six) hours as needed (for SBP greater than 160).   unk  . divalproex (DEPAKOTE) 125 MG DR tablet Take 125 mg by mouth 2 (two) times daily.   04/21/2017 at Unknown time  . divalproex (DEPAKOTE) 500 MG DR tablet Take 500 mg by mouth 2 (two) times daily.   04/21/2017 at 0900  . DULoxetine (CYMBALTA) 20 MG capsule Take 20 mg by mouth daily.   04/21/2017 at 0800  . labetalol (NORMODYNE) 200 MG tablet Take 200 mg by mouth 2 (two) times daily.   04/21/2017 at 0900  . lactulose, encephalopathy, (CHRONULAC) 10 GM/15ML SOLN Take 20 g  by mouth daily as needed.     . levETIRAcetam (KEPPRA) 1000 MG tablet Take 1,000 mg by mouth daily.   04/21/2017 at 0900  . levETIRAcetam (KEPPRA) 500 MG tablet Take 1,500 mg by mouth at bedtime.   04/20/2017 at 2100  . levothyroxine (SYNTHROID, LEVOTHROID) 100 MCG tablet Take 100 mcg by mouth daily before breakfast.   04/21/2017 at 0630  . lidocaine (LIDODERM) 5 % Place 1 patch onto the skin daily. Remove & Discard patch within 12 hours or as directed by MD   04/21/2017 at 1000  . loratadine (CLARITIN) 10 MG tablet Take 10 mg by mouth daily.      Marland Kitchen losartan (COZAAR) 50 MG tablet Take 50 mg by mouth at bedtime.   04/20/2017 at 2100  . Menthol, Topical Analgesic, 5 % GEL Apply 1 application topically 4 (four) times daily as needed.     . Multiple Vitamin (MULTIVITAMIN WITH MINERALS) TABS tablet Take 1 tablet by mouth daily.   04/21/2017 at 0900  . nystatin (NYSTATIN) powder Apply 1 g topically 2 (two) times daily.   04/21/2017 at 0900  . nystatin cream (MYCOSTATIN) Apply 1 application topically 2 (two) times daily.   04/21/2017 at 0900  . Olopatadine HCl (PATADAY) 0.2 % SOLN Place 1 drop into both eyes at bedtime.   04/20/2017 at 2100  . omeprazole (PRILOSEC) 20 MG capsule Take 20 mg by mouth daily.   04/21/2017 at 0630  . oxyCODONE (OXY IR/ROXICODONE) 5 MG immediate release tablet Take 5 mg by mouth every 8 (eight) hours as needed for pain (for moderate to severe pain).   04/21/2017 at 0800  . Polyethyl Glycol-Propyl Glycol (SYSTANE) 0.4-0.3 % SOLN Place 1 drop into both eyes 3 (three) times daily as needed.   04/21/2017 at 1330  . polyethylene glycol (MIRALAX / GLYCOLAX) packet Take 17 g by mouth daily.   04/21/2017 at 0900  . rosuvastatin (CRESTOR) 5 MG tablet Take 5 mg by mouth at bedtime.   04/20/2017 at 2100  . traZODone (DESYREL) 50 MG tablet Take 50 mg by mouth at bedtime.   04/20/2017 at 2100  . Vitamin D, Ergocalciferol, (DRISDOL) 50000 units CAPS capsule Take 50,000 Units by mouth every 30 (thirty) days.   04/09/2017  . DULoxetine (CYMBALTA) 60 MG capsule Take 60 mg by mouth daily.   unk   Allergies:  Allergies  Allergen Reactions  . Codeine Other (See Comments)    unknown  . Lacosamide Other (See Comments)    Mood changes/agitation No rx noted    . Morphine Other (See Comments)    No rx noted    . Zolpidem     History reviewed. No pertinent family history. Social History:  reports that  has never smoked. she has never used smokeless tobacco. She reports that she does not drink alcohol or use drugs.  ROS: A complete review of  systems was performed.  All systems are negative except for pertinent findings as noted. Review of Systems  Unable to perform ROS: Mental status change     Physical Exam:  Vital signs in last 24 hours: Temp:  [100.2 F (37.9 C)-100.3 F (37.9 C)] 100.3 F (37.9 C) (02/14 1908) Pulse Rate:  [73-118] 115 (02/14 1937) Resp:  [14-37] 22 (02/14 1908) BP: (82-143)/(48-89) 99/60 (02/14 1937) SpO2:  [92 %-100 %] 96 % (02/14 1937) General:  No acute distress, sleepy, but arousable.  HEENT: Normocephalic, atraumatic Cardiovascular: Regular rate and rhythm Lungs: Regular rate and effort Abdomen: Soft,  nontender, nondistended, no abdominal masses periprosthetic joint infection, right hip hemiarthroplasty. Extremities: No edema  Laboratory Data:  Results for orders placed or performed during the hospital encounter of 04/21/17 (from the past 24 hour(s))  Urine rapid drug screen (hosp performed)not at [facility]     Status: None   Collection Time: 04/22/17  3:00 AM  Result Value Ref Range   Opiates NONE DETECTED NONE DETECTED   Cocaine NONE DETECTED NONE DETECTED   Benzodiazepines NONE DETECTED NONE DETECTED   Amphetamines NONE DETECTED NONE DETECTED   Tetrahydrocannabinol NONE DETECTED NONE DETECTED   Barbiturates NONE DETECTED NONE DETECTED  Urinalysis, Routine w reflex microscopic     Status: Abnormal   Collection Time: 04/22/17  3:00 AM  Result Value Ref Range   Color, Urine AMBER (A) YELLOW   APPearance CLOUDY (A) CLEAR   Specific Gravity, Urine >1.046 (H) 1.005 - 1.030   pH 5.0 5.0 - 8.0   Glucose, UA NEGATIVE NEGATIVE mg/dL   Hgb urine dipstick LARGE (A) NEGATIVE   Bilirubin Urine SMALL (A) NEGATIVE   Ketones, ur NEGATIVE NEGATIVE mg/dL   Protein, ur 100 (A) NEGATIVE mg/dL   Nitrite NEGATIVE NEGATIVE   Leukocytes, UA MODERATE (A) NEGATIVE   RBC / HPF TOO NUMEROUS TO COUNT 0 - 5 RBC/hpf   WBC, UA TOO NUMEROUS TO COUNT 0 - 5 WBC/hpf   Bacteria, UA FEW (A) NONE SEEN   Squamous  Epithelial / LPF 0-5 (A) NONE SEEN   WBC Clumps PRESENT   Sodium, urine, random     Status: None   Collection Time: 04/22/17  3:00 AM  Result Value Ref Range   Sodium, Ur 25 mmol/L  Creatinine, urine, random     Status: None   Collection Time: 04/22/17  3:00 AM  Result Value Ref Range   Creatinine, Urine 172.08 mg/dL  Lipid panel     Status: Abnormal   Collection Time: 04/22/17  4:49 AM  Result Value Ref Range   Cholesterol 107 0 - 200 mg/dL   Triglycerides 160 (H) <150 mg/dL   HDL 30 (L) >40 mg/dL   Total CHOL/HDL Ratio 3.6 RATIO   VLDL 32 0 - 40 mg/dL   LDL Cholesterol 45 0 - 99 mg/dL  Basic metabolic panel     Status: Abnormal   Collection Time: 04/22/17  4:49 AM  Result Value Ref Range   Sodium 146 (H) 135 - 145 mmol/L   Potassium 4.4 3.5 - 5.1 mmol/L   Chloride 108 101 - 111 mmol/L   CO2 24 22 - 32 mmol/L   Glucose, Bld 69 65 - 99 mg/dL   BUN 39 (H) 6 - 20 mg/dL   Creatinine, Ser 2.69 (H) 0.44 - 1.00 mg/dL   Calcium 8.4 (L) 8.9 - 10.3 mg/dL   GFR calc non Af Amer 18 (L) >60 mL/min   GFR calc Af Amer 21 (L) >60 mL/min   Anion gap 14 5 - 15  CBC     Status: Abnormal   Collection Time: 04/22/17  4:49 AM  Result Value Ref Range   WBC 19.0 (H) 4.0 - 10.5 K/uL   RBC 4.55 3.87 - 5.11 MIL/uL   Hemoglobin 13.3 12.0 - 15.0 g/dL   HCT 43.3 36.0 - 46.0 %   MCV 95.2 78.0 - 100.0 fL   MCH 29.2 26.0 - 34.0 pg   MCHC 30.7 30.0 - 36.0 g/dL   RDW 16.2 (H) 11.5 - 15.5 %   Platelets 50 (L) 150 -  400 K/uL  Culture, blood (routine x 2)     Status: None (Preliminary result)   Collection Time: 04/22/17  8:02 AM  Result Value Ref Range   Specimen Description BLOOD RIGHT HAND    Special Requests      BOTTLES DRAWN AEROBIC ONLY Blood Culture results may not be optimal due to an inadequate volume of blood received in culture bottles Performed at [facility], [address] [place], [place] [address]    Culture PENDING    Report Status PENDING   Culture, blood (routine x 2)      Status: None (Preliminary result)   Collection Time: 04/22/17  9:00 AM  Result Value Ref Range   Specimen Description BLOOD LEFT HAND    Special Requests      BOTTLES DRAWN AEROBIC AND ANAEROBIC Blood Culture adequate volume Performed at [facility], [address], [place], [place] [address]    Culture PENDING    Report Status PENDING   Lactic acid, plasma     Status: Abnormal   Collection Time: 04/22/17  9:00 AM  Result Value Ref Range   Lactic Acid, Venous 2.0 (HH) 0.5 - 1.9 mmol/L  Urinalysis, Routine w reflex microscopic     Status: Abnormal   Collection Time: 04/22/17  9:56 AM  Result Value Ref Range   Color, Urine BROWN (A) YELLOW   APPearance TURBID (A) CLEAR   Specific Gravity, Urine 1.025 1.005 - 1.030   pH 5.0 5.0 - 8.0   Glucose, UA NEGATIVE NEGATIVE mg/dL   Hgb urine dipstick LARGE (A) NEGATIVE   Bilirubin Urine MODERATE (A) NEGATIVE   Ketones, ur TRACE (A) NEGATIVE mg/dL   Protein, ur >300 (A) NEGATIVE mg/dL   Nitrite POSITIVE (A) NEGATIVE   Leukocytes, UA MODERATE (A) NEGATIVE  Urinalysis, Microscopic (reflex)     Status: Abnormal   Collection Time: 04/22/17  9:56 AM  Result Value Ref Range   RBC / HPF TOO NUMEROUS TO COUNT 0 - 5 RBC/hpf   WBC, UA TOO NUMEROUS TO COUNT 0 - 5 WBC/hpf   Bacteria, UA MANY (A) NONE SEEN   Squamous Epithelial / LPF 0-5 (A) NONE SEEN  Glucose, capillary     Status: Abnormal   Collection Time: 04/22/17  7:12 PM  Result Value Ref Range   Glucose-Capillary 111 (H) 65 - 99 mg/dL   Recent Results (from the past 240 hour(s))  Culture, blood (routine x 2)     Status: None (Preliminary result)   Collection Time: 04/22/17  8:02 AM  Result Value Ref Range Status   Specimen Description BLOOD RIGHT HAND  Final   Special Requests   Final    BOTTLES DRAWN AEROBIC ONLY Blood Culture results may not be optimal due to an inadequate volume of blood received in culture bottles Performed at [facility], [address], [place],  [address]     Culture PENDING  Incomplete   Report Status PENDING  Incomplete  Culture, blood (routine x 2)     Status: None (Preliminary result)   Collection Time: 04/22/17  9:00 AM  Result Value Ref Range Status   Specimen Description BLOOD LEFT HAND  Final   Special Requests   Final    BOTTLES DRAWN AEROBIC AND ANAEROBIC Blood Culture adequate volume Performed at [facility], [address], [place],  [address]    Culture PENDING  Incomplete   Report Status PENDING  Incomplete   Creatinine: Recent Labs    04/21/17 1107 04/21/17  1121 04/22/17 0449  CREATININE 2.04* 2.00* 2.69*    Impression/Assessment/plan: -right renal stone, right hydronephrosis, sepsis, AKI likely prerenal and ATN -- hydro not so much a concern for contributing to renal failure, but concern with septic-like picture and need to decompress the right system. I discussed with the patient (her husband was consented by phone) the nature, potential benefits, risks and alternatives to cystoscopy, bilateral RGP, right possible left ureteral stent, including side effects of the proposed treatment, the likelihood of the patient achieving the goals of the procedure, and any potential problems that might occur during the procedure or recuperation. All questions answered. Patient elects to proceed.    Festus Aloe 04/22/2017, 8:08 PM

## 2017-04-22 NOTE — Progress Notes (Signed)
Pharmacy Antibiotic Note  [name] is a 62 y.o. female admitted on 04/21/2017 with UTI, aspiration PNA Pharmacy has been consulted for zosyn dosing.  Plan: Zosyn 3.375g IV q8h (4 hour infusion).  F/u cultures and clinical course  Weight: 215 lb (97.5 kg)  Temp (24hrs), Avg:99.7 F (37.6 C), Min:99.1 F (37.3 C), Max:100.2 F (37.9 C)  Recent Labs  Lab 04/21/17 1107 04/21/17 1121 04/22/17 0449 04/22/17 0900  WBC 12.6*  --  19.0*  --   CREATININE 2.04* 2.00* 2.69*  --   LATICACIDVEN  --   --   --  2.0*    CrCl cannot be calculated (Unknown ideal weight.).    Allergies  Allergen Reactions  . Codeine Other (See Comments)    unknown  . Lacosamide Other (See Comments)    Mood changes/agitation No rx noted    . Morphine Other (See Comments)    No rx noted    . Zolpidem     Thank you for allowing pharmacy to be a part of this patient's care.  Excell Seltzer Poteet 04/22/2017 10:10 AM

## 2017-04-22 NOTE — Transfer of Care (Signed)
Immediate Anesthesia Transfer of Care Note  Patient: [name]  Procedure(s) Performed: CYSTOSCOPY WITH BILATERAL  RETROGRADE PYELOGRAM, RIGHT URETERAL STENT PLACEMENT (Bilateral )  Patient Location: PACU  Anesthesia Type:General  Level of Consciousness: alert  and patient cooperative  Airway & Oxygen Therapy: Patient Spontanous Breathing and Patient connected to face mask oxygen  Post-op Assessment: Report given to RN and Post -op Vital signs reviewed and stable  Post vital signs: Reviewed and stable  Last Vitals:  Vitals:   04/22/17 1936 04/22/17 1937  BP:  99/60  Pulse: (!) 117 (!) 115  Resp:    Temp:    SpO2: 97% 96%    Last Pain:  Vitals:   04/22/17 1908  TempSrc: Oral  PainSc:          Complications: No apparent anesthesia complications

## 2017-04-22 NOTE — Progress Notes (Signed)
Patient ID: TARINA VOLK, female   DOB: [birth year], 62 y.o.   MRN: [id number] 62 year old female transferred here from [facility] for cystoscopy with right ureteral stent placement for urosepsis.  Plan was for her to come here for her procedure then go back to [facility] for treatment of her UTI with sepsis after decompression of her right hydronephrosis.  Also had a mild right obstructing stone.  Postoperatively in the PACU she is had some soft blood pressures and anesthesia has requested that she stay here at [facility] long instead of putting her back in the ambulance to transport back to [facility].  Also any pain does not have an anesthesiologist over there to monitor this patient closely.  Patient's blood pressures have improved with a liter of fluid in the PACU.  I put in for a bed for stepdown here.  Continue Zosyn.  Patient is alert no apparent distress.  Cards regular rate and rhythm without murmurs rubs or gallops chest clear to auscultation bilaterally no wheeze or rales abdomen is soft nontender nondistended positive bowel sounds  extremities no clubbing cyanosis or edema  Additional 35 minutes in reviewing patient's chart, direct patient care, discussing with PACU nurses, discussing with our flow manager in bed placement, discussing with urologist Dr. Junious Silk and coordinating her care here at [facility].

## 2017-04-22 NOTE — Anesthesia Procedure Notes (Signed)
Procedure Name: LMA Insertion Date/Time: 04/22/2017 8:18 PM Performed by: West Pugh, CRNA Pre-anesthesia Checklist: Patient identified, Emergency Drugs available, Suction available, Patient being monitored and Timeout performed Patient Re-evaluated:Patient Re-evaluated prior to induction Oxygen Delivery Method: Circle system utilized Preoxygenation: Pre-oxygenation with 100% oxygen Induction Type: IV induction LMA: LMA with gastric port inserted LMA Size: 5.0 Number of attempts: 1 Placement Confirmation: positive ETCO2 and CO2 detector Tube secured with: Tape Dental Injury: Teeth and Oropharynx as per pre-operative assessment

## 2017-04-22 NOTE — ED Notes (Signed)
Pt transferred to a hospital bed, cleaned of stool, and Tonya, NT was able to get urine via in an in and out cath.

## 2017-04-23 ENCOUNTER — Encounter (HOSPITAL_COMMUNITY): Payer: Self-pay | Admitting: Urology

## 2017-04-23 ENCOUNTER — Inpatient Hospital Stay (HOSPITAL_COMMUNITY): Payer: Medicare Other

## 2017-04-23 DIAGNOSIS — G4731 Primary central sleep apnea: Secondary | ICD-10-CM

## 2017-04-23 DIAGNOSIS — R6521 Severe sepsis with septic shock: Secondary | ICD-10-CM

## 2017-04-23 DIAGNOSIS — A419 Sepsis, unspecified organism: Secondary | ICD-10-CM

## 2017-04-23 DIAGNOSIS — N179 Acute kidney failure, unspecified: Secondary | ICD-10-CM

## 2017-04-23 DIAGNOSIS — I639 Cerebral infarction, unspecified: Secondary | ICD-10-CM

## 2017-04-23 DIAGNOSIS — A4101 Sepsis due to Methicillin susceptible Staphylococcus aureus: Secondary | ICD-10-CM

## 2017-04-23 LAB — CBC WITH DIFFERENTIAL/PLATELET
BASOS ABS: 0 10*3/uL (ref 0.0–0.1)
BASOS PCT: 0 %
Band Neutrophils: 20 %
EOS ABS: 0 10*3/uL (ref 0.0–0.7)
EOS PCT: 0 %
HCT: 37.1 % (ref 36.0–46.0)
HEMOGLOBIN: 11.7 g/dL — AB (ref 12.0–15.0)
LYMPHS PCT: 12 %
Lymphs Abs: 2.4 10*3/uL (ref 0.7–4.0)
MCH: 29.8 pg (ref 26.0–34.0)
MCHC: 31.5 g/dL (ref 30.0–36.0)
MCV: 94.6 fL (ref 78.0–100.0)
MONO ABS: 0.4 10*3/uL (ref 0.1–1.0)
MONOS PCT: 2 %
MYELOCYTES: 1 %
Metamyelocytes Relative: 3 %
NEUTROS PCT: 62 %
Neutro Abs: 17.4 10*3/uL — ABNORMAL HIGH (ref 1.7–7.7)
Platelets: 38 10*3/uL — ABNORMAL LOW (ref 150–400)
RBC: 3.92 MIL/uL (ref 3.87–5.11)
RDW: 16.5 % — ABNORMAL HIGH (ref 11.5–15.5)
WBC MORPHOLOGY: INCREASED
WBC: 20.2 10*3/uL — ABNORMAL HIGH (ref 4.0–10.5)

## 2017-04-23 LAB — COMPREHENSIVE METABOLIC PANEL
ALT: 15 U/L (ref 14–54)
AST: 42 U/L — ABNORMAL HIGH (ref 15–41)
Albumin: 2.5 g/dL — ABNORMAL LOW (ref 3.5–5.0)
Alkaline Phosphatase: 58 U/L (ref 38–126)
Anion gap: 11 (ref 5–15)
BUN: 52 mg/dL — ABNORMAL HIGH (ref 6–20)
CHLORIDE: 115 mmol/L — AB (ref 101–111)
CO2: 23 mmol/L (ref 22–32)
CREATININE: 2.57 mg/dL — AB (ref 0.44–1.00)
Calcium: 7.1 mg/dL — ABNORMAL LOW (ref 8.9–10.3)
GFR calc non Af Amer: 19 mL/min — ABNORMAL LOW (ref >60)
GFR, EST AFRICAN AMERICAN: 22 mL/min — AB (ref >60)
Glucose, Bld: 86 mg/dL (ref 65–99)
Potassium: 4 mmol/L (ref 3.5–5.1)
SODIUM: 149 mmol/L — AB (ref 135–145)
Total Bilirubin: 0.6 mg/dL (ref 0.3–1.2)
Total Protein: 5.8 g/dL — ABNORMAL LOW (ref 6.5–8.1)

## 2017-04-23 LAB — BLOOD CULTURE ID PANEL (REFLEXED)
ACINETOBACTER BAUMANNII: NOT DETECTED
CANDIDA GLABRATA: NOT DETECTED
CANDIDA KRUSEI: NOT DETECTED
CANDIDA PARAPSILOSIS: NOT DETECTED
CARBAPENEM RESISTANCE: NOT DETECTED
Candida albicans: NOT DETECTED
Candida tropicalis: NOT DETECTED
ENTEROBACTERIACEAE SPECIES: DETECTED — AB
ESCHERICHIA COLI: DETECTED — AB
Enterobacter cloacae complex: NOT DETECTED
Enterococcus species: NOT DETECTED
Haemophilus influenzae: NOT DETECTED
KLEBSIELLA OXYTOCA: NOT DETECTED
KLEBSIELLA PNEUMONIAE: NOT DETECTED
LISTERIA MONOCYTOGENES: NOT DETECTED
Neisseria meningitidis: NOT DETECTED
PSEUDOMONAS AERUGINOSA: NOT DETECTED
Proteus species: NOT DETECTED
STAPHYLOCOCCUS AUREUS BCID: NOT DETECTED
STREPTOCOCCUS PNEUMONIAE: NOT DETECTED
STREPTOCOCCUS PYOGENES: NOT DETECTED
Serratia marcescens: NOT DETECTED
Staphylococcus species: NOT DETECTED
Streptococcus agalactiae: NOT DETECTED
Streptococcus species: NOT DETECTED

## 2017-04-23 LAB — BLOOD GAS, ARTERIAL
Acid-base deficit: 2.9 mmol/L — ABNORMAL HIGH (ref 0.0–2.0)
Bicarbonate: 22.9 mmol/L (ref 20.0–28.0)
DRAWN BY: 331471
O2 CONTENT: 3 L/min
O2 Saturation: 94.8 %
PATIENT TEMPERATURE: 98.6
pCO2 arterial: 47 mmHg (ref 32.0–48.0)
pH, Arterial: 7.309 — ABNORMAL LOW (ref 7.350–7.450)
pO2, Arterial: 81.2 mmHg — ABNORMAL LOW (ref 83.0–108.0)

## 2017-04-23 LAB — ECHOCARDIOGRAM COMPLETE
HEIGHTINCHES: 61.811 in
Weight: 3440 oz

## 2017-04-23 LAB — TROPONIN I
TROPONIN I: 0.07 ng/mL — AB (ref <0.03)
TROPONIN I: 0.08 ng/mL — AB (ref <0.03)

## 2017-04-23 LAB — AMMONIA: Ammonia: 15 umol/L (ref 9–35)

## 2017-04-23 LAB — PROCALCITONIN: Procalcitonin: 48.65 ng/mL

## 2017-04-23 LAB — LACTIC ACID, PLASMA: Lactic Acid, Venous: 1.6 mmol/L (ref 0.5–1.9)

## 2017-04-23 MED ORDER — SODIUM CHLORIDE 0.9 % IV SOLN
1.0000 g | Freq: Two times a day (BID) | INTRAVENOUS | Status: DC
  Administered 2017-04-23 – 2017-04-25 (×5): 1 g via INTRAVENOUS
  Filled 2017-04-23 (×6): qty 1

## 2017-04-23 MED ORDER — PANTOPRAZOLE SODIUM 40 MG IV SOLR
40.0000 mg | INTRAVENOUS | Status: DC
  Administered 2017-04-23 – 2017-04-24 (×2): 40 mg via INTRAVENOUS
  Filled 2017-04-23 (×2): qty 40

## 2017-04-23 MED ORDER — PERFLUTREN LIPID MICROSPHERE
1.0000 mL | INTRAVENOUS | Status: AC | PRN
  Administered 2017-04-23: 2 mL via INTRAVENOUS
  Filled 2017-04-23: qty 10

## 2017-04-23 MED ORDER — HALOPERIDOL LACTATE 5 MG/ML IJ SOLN: 2.0000 mg | Freq: Four times a day (QID) | INTRAMUSCULAR | Status: DC | PRN

## 2017-04-23 MED ORDER — SODIUM CHLORIDE 0.45 % IV SOLN
INTRAVENOUS | Status: DC
  Administered 2017-04-23 – 2017-04-24 (×3): via INTRAVENOUS

## 2017-04-23 MED ORDER — SODIUM CHLORIDE 0.9 % IV BOLUS (SEPSIS)
1000.0000 mL | Freq: Once | INTRAVENOUS | Status: AC
  Administered 2017-04-23: 1000 mL via INTRAVENOUS

## 2017-04-23 MED ORDER — LEVETIRACETAM 500 MG PO TABS
1000.0000 mg | ORAL_TABLET | Freq: Every day | ORAL | Status: DC
  Administered 2017-04-25 – 2017-04-28 (×4): 1000 mg via ORAL
  Filled 2017-04-23 (×4): qty 2

## 2017-04-23 MED ORDER — SODIUM CHLORIDE 0.9 % IV BOLUS (SEPSIS)
500.0000 mL | Freq: Once | INTRAVENOUS | Status: AC
  Administered 2017-04-23: 500 mL via INTRAVENOUS

## 2017-04-23 MED ORDER — NALOXONE HCL 0.4 MG/ML IJ SOLN
0.4000 mg | Freq: Once | INTRAMUSCULAR | Status: AC
  Administered 2017-04-23: 0.4 mg via INTRAVENOUS
  Filled 2017-04-23: qty 1

## 2017-04-23 MED FILL — Dextrose Inj 50%: INTRAVENOUS | Qty: 50 | Status: AC

## 2017-04-23 NOTE — Progress Notes (Signed)
Delay on placing pt on CPAP due to pt getting and ECHO.

## 2017-04-23 NOTE — Consult Note (Signed)
PULMONARY / CRITICAL CARE MEDICINE   Name: [name] MRN: [id number] DOB: 06-11-1955    ADMISSION DATE:  04/21/2017 CONSULTATION DATE:  04/23/17  REFERRING MD:  Dr. Tawanna Solo / [facility]   REASON FOR CONSULT:  Hypotension   HISTORY OF PRESENT ILLNESS:   62 y/o F who transferred from [facility] on 2/13 with an obstructing 31mm right renal calculus, hydronephrosis, altered mental status and concerns for sepsis.    On 2/14, she underwent a cystoscopy with right ureteral stent placement.  Post-procedure, she was monitored in SDU.  She developed hypotension & fever around 4am on 2/15.  The patient was treated with 2L NS with improvement in blood pressure.  She was also given narcan for altered sensorium and per RN report this did not improve her mentation.  ABG assessed 7.30 / 47 / 81 / 22.  Labs - Na 149, K 4, Cl 115, CO2 23, BUN 52, Sr Cr 2.57, WBC 20, Hgb 11 and platelets 38 (down from 58 on presentation, chronic on depakote).   PCCM consulted for evaluation of hypotension.    PAST MEDICAL HISTORY :  She  has a past medical history of Acute respiratory failure ([place]) (07/27/2012), Arthritis, Chronic pain, CVA (cerebral vascular accident) ([place]), Depression, Diabetes mellitus without complication ([place]), Hemiparesis affecting left side as late effect of cerebrovascular accident [facility]), Hypertension, Metabolic encephalopathy (32/20/2542), Narcotic overdose ([place]) (07/27/2012), Respiratory failure ([place]) (05/06/10), Seizure disorder ([place]), Seizures ([place]), Sleep apnea (04/2010), Sleep apnea, Spasticity, Thrombocytopenia ([place]), Thrombocytopenia, unspecified ([place]) (07/28/2012), Unspecified hypothyroidism, Unspecified psychosis (03/14/10), and UTI (lower urinary tract infection) (05/06/10).  PAST SURGICAL HISTORY: She  has a past surgical history that includes Intrathecal pump implantation (2010); Cerebral aneurysm repair (1999); and Cystoscopy w/ ureteral stent placement (Bilateral, 04/22/2017).  Allergies   Allergen Reactions  . Codeine Other (See Comments)    unknown  . Lacosamide Other (See Comments)    Mood changes/agitation No rx noted    . Morphine Other (See Comments)    No rx noted    . Zolpidem     No current facility-administered medications on file prior to encounter.    Current Outpatient Medications on File Prior to Encounter  Medication Sig  . acetaminophen (TYLENOL) 160 MG/5ML suspension Take 960 mg by mouth every 6 (six) hours as needed for moderate pain or fever.  Marland Kitchen amLODipine (NORVASC) 5 MG tablet Take 5 mg by mouth daily.  Marland Kitchen aspirin EC 81 MG tablet Take 81 mg by mouth daily.  . cloNIDine (CATAPRES) 0.1 MG tablet Take 0.1 mg by mouth every 6 (six) hours as needed (for SBP greater than 160).  Marland Kitchen divalproex (DEPAKOTE) 125 MG DR tablet Take 125 mg by mouth 2 (two) times daily.  . divalproex (DEPAKOTE) 500 MG DR tablet Take 500 mg by mouth 2 (two) times daily.  . DULoxetine (CYMBALTA) 20 MG capsule Take 20 mg by mouth daily.  Marland Kitchen labetalol (NORMODYNE) 200 MG tablet Take 200 mg by mouth 2 (two) times daily.  Marland Kitchen lactulose, encephalopathy, (CHRONULAC) 10 GM/15ML SOLN Take 20 g by mouth daily as needed.  . levETIRAcetam (KEPPRA) 1000 MG tablet Take 1,000 mg by mouth daily.  Marland Kitchen levETIRAcetam (KEPPRA) 500 MG tablet Take 1,500 mg by mouth at bedtime.  Marland Kitchen levothyroxine (SYNTHROID, LEVOTHROID) 100 MCG tablet Take 100 mcg by mouth daily before breakfast.  . lidocaine (LIDODERM) 5 % Place 1 patch onto the skin daily. Remove & Discard patch within 12 hours or as directed by MD  .  loratadine (CLARITIN) 10 MG tablet Take 10 mg by mouth daily.  Marland Kitchen losartan (COZAAR) 50 MG tablet Take 50 mg by mouth at bedtime.  . Menthol, Topical Analgesic, 5 % GEL Apply 1 application topically 4 (four) times daily as needed.  . Multiple Vitamin (MULTIVITAMIN WITH MINERALS) TABS tablet Take 1 tablet by mouth daily.  Marland Kitchen nystatin (NYSTATIN) powder Apply 1 g topically 2 (two) times daily.  Marland Kitchen nystatin cream  (MYCOSTATIN) Apply 1 application topically 2 (two) times daily.  . Olopatadine HCl (PATADAY) 0.2 % SOLN Place 1 drop into both eyes at bedtime.  Marland Kitchen omeprazole (PRILOSEC) 20 MG capsule Take 20 mg by mouth daily.  Marland Kitchen oxyCODONE (OXY IR/ROXICODONE) 5 MG immediate release tablet Take 5 mg by mouth every 8 (eight) hours as needed for pain (for moderate to severe pain).  Vladimir Faster Glycol-Propyl Glycol (SYSTANE) 0.4-0.3 % SOLN Place 1 drop into both eyes 3 (three) times daily as needed.  . polyethylene glycol (MIRALAX / GLYCOLAX) packet Take 17 g by mouth daily.  . rosuvastatin (CRESTOR) 5 MG tablet Take 5 mg by mouth at bedtime.  . traZODone (DESYREL) 50 MG tablet Take 50 mg by mouth at bedtime.  . Vitamin D, Ergocalciferol, (DRISDOL) 50000 units CAPS capsule Take 50,000 Units by mouth every 30 (thirty) days.  . DULoxetine (CYMBALTA) 60 MG capsule Take 60 mg by mouth daily.    FAMILY HISTORY:  Her has no family status information on file.    SOCIAL HISTORY: She  reports that  has never smoked. she has never used smokeless tobacco. She reports that she does not drink alcohol or use drugs.  REVIEW OF SYSTEMS:  Unable to complete as patient is altered   SUBJECTIVE:    VITAL SIGNS: BP (!) 78/48   Pulse (!) 121   Temp 100.3 F (37.9 C) (Axillary)   Resp (!) 39   Ht 5' 1.81" (1.57 m)   Wt 215 lb (97.5 kg)   SpO2 (!) 89%   BMI 39.56 kg/m   HEMODYNAMICS:    VENTILATOR SETTINGS:    INTAKE / OUTPUT: I/O last 3 completed shifts: In: 5051.7 [I.V.:4701.7; IV Piggyback:350] Out: 245 [Urine:245]  PHYSICAL EXAMINATION: General:  Morbidly obese female lying in bed in NAD HEENT: MM pink/moist, short neck, old midline scar (? Trach) Neuro: drowsy, awakens to stimulation, opens eyes and looks around, left hemiparesis, BLE changes consistent with immobility  CV: s1s2 rrr, no m/r/g PULM: even/non-labored, lungs bilaterally coarse, occasional rhonchi  KY:HCWC, non-tender, bsx4 active   Extremities: warm/dry, 1+ pitting BLE edema  Skin: no rashes or lesions    LABS:  BMET Recent Labs  Lab 04/22/17 0449 04/22/17 2308 04/23/17 0802  NA 146* 149* 149*  K 4.4 4.1 4.0  CL 108 112* 115*  CO2 24 22 23   BUN 39* 48* 52*  CREATININE 2.69* 2.55* 2.57*  GLUCOSE 69 91 86    Electrolytes Recent Labs  Lab 04/22/17 0449 04/22/17 2308 04/23/17 0802  CALCIUM 8.4* 7.6* 7.1*    CBC Recent Labs  Lab 04/22/17 0449 04/22/17 2308 04/23/17 0802  WBC 19.0* 21.4* 20.2*  HGB 13.3 12.4 11.7*  HCT 43.3 39.2 37.1  PLT 50* 43* 38*    Coag's Recent Labs  Lab 04/21/17 1107  APTT 60*  INR 1.22    Sepsis Markers Recent Labs  Lab 04/22/17 0900 04/22/17 2308 04/23/17 0254  LATICACIDVEN 2.0* 1.9 1.6    ABG Recent Labs  Lab 04/23/17 1146  PHART 7.309*  PCO2ART  47.0  PO2ART 81.2*    Liver Enzymes Recent Labs  Lab 04/21/17 1107 04/23/17 0802  AST 42* 42*  ALT 12* 15  ALKPHOS 66 58  BILITOT 0.9 0.6  ALBUMIN 3.3* 2.5*    Cardiac Enzymes No results for input(s): TROPONINI, PROBNP in the last 168 hours.  Glucose Recent Labs  Lab 04/21/17 1054 04/22/17 1912 04/22/17 2147  GLUCAP 85 111* 98    Imaging Ct Abdomen Pelvis Wo Contrast  Result Date: 04/22/2017 CLINICAL DATA:  Recurrent urinary tract infection. Diabetes. Hydronephrosis seen on recent ultrasound. EXAM: CT ABDOMEN AND PELVIS WITHOUT CONTRAST TECHNIQUE: Multidetector CT imaging of the abdomen and pelvis was performed following the standard protocol without IV contrast. COMPARISON:  04/22/2017 FINDINGS: Despite efforts by the technologist and patient, motion artifact is present on today's exam and could not be eliminated. This reduces exam sensitivity and specificity. Lower chest: Mild cardiomegaly with small bilateral pleural effusions and passive atelectasis in the lung bases. Hepatobiliary: Dependent density in the gallbladder could be sludge or gallstones. Otherwise unremarkable. Pancreas:  Unremarkable Spleen: Unremarkable Adrenals/Urinary Tract: The adrenal glands appear normal. There is abnormal bilateral delayed nephrogram with contrast medium in both collecting systems, ureters, and the mostly empty urinary bladder (Foley catheter in place). There is right hydronephrosis along with asymmetric abnormal stranding around the right renal collecting system. Patchy variable contrast in the left kidney noted. With the use of bone window in, the contrast in the renal collecting systems can be somewhat differentiated from the calculi. There is a central left renal collecting system calculus measuring 1.3 by 0.9 cm on image 40/2. Multiple nonobstructive left renal collecting system calculi are shown for example on image 33/2 using bone windows. Multiple clustered calcifications in the left kidney lower pole are also present. Particularly on the left side. Stomach/Bowel: There is an unusual appearance of the rectum, with marked wall thickening in the lower rectum circumferentially, and a patulous anus. Vascular/Lymphatic: Scattered small retroperitoneal lymph nodes are present. These may be reactive. Aortoiliac atherosclerotic vascular disease. Reproductive: Multiple uterine fibroids, subserosal and intramural, similar contour to the prior exam. Other: No supplemental non-categorized findings. Musculoskeletal: High density along the margins of the inferior thecal sac on image 64/6, possibly calcification or contrast, less likely blood products. An intrathecal pump is present. IMPRESSION: 1. Mild right hydronephrosis related to a 1.3 by 0.9 cm right collecting system stone which is likely mildly obstructing the right UPJ. 2. Abnormal delayed nephrograms compatible with acute renal injury. This contrast was administered yesterday and is still in the kidneys. There is also some excreted contrast in the collecting systems and ureters. 3. Greater degree of heterogeneity in the left kidney than the right with  regard to the contrast distribution and appearance, along with left perirenal stranding somewhat greater than the right. I cannot exclude pyelonephritis given this appearance, but the appearance is not considered specific given the difference in degree of obstruction and the overall unusual situation of renal contrast 1 day after administration. 4. Mild cardiomegaly with bilateral pleural effusions. 5. Abnormal appearance of the rectum with considerable distal rectal wall thickening and patulous anus. 6. There is high density in the thecal sac inferiorly, probably calcification. 7. Other imaging findings of potential clinical significance: Uterine fibroids. Possible gallstones. Aortic Atherosclerosis (ICD10-I70.0). Electronically Signed   By: Van Clines M.D.   On: 04/22/2017 17:06   Ct Head Wo Contrast  Result Date: 04/23/2017 CLINICAL DATA:  Altered level consciousness EXAM: CT HEAD WITHOUT CONTRAST TECHNIQUE: Contiguous axial  images were obtained from the base of the skull through the vertex without intravenous contrast. COMPARISON:  None. FINDINGS: Brain: No evidence of acute infarction, hemorrhage, extra-axial collection, ventriculomegaly, or mass effect. Old, large right MCA territory infarct with encephalomalacia. Generalized cerebral atrophy. Periventricular white matter low attenuation likely secondary to microangiopathy. Vascular: Cerebrovascular atherosclerotic calcifications are noted. 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 Skull: Negative for fracture or focal lesion. Sinuses/Orbits: Visualized portions of the orbits are unremarkable. Visualized portions of the paranasal sinuses and mastoid air cells are unremarkable. Other: None. IMPRESSION: 1. No acute intracranial pathology. 2. Old right MCA territory infarct with encephalomalacia. Electronically Signed   By: Kathreen Devoid   On: 04/23/2017 13:16   Dg C-arm 1-60 Min-no Report  Result Date: 04/22/2017 Fluoroscopy was  utilized by the requesting physician.  No radiographic interpretation.     STUDIES:  CTA Head 2/13 >> no emergent large vessel occlusion, large area of chronic encephalomalacia in the right hemisphere CT ABD/Pelvis 2/14 >> mild right hydronephrosis 1.3 x0.9 cm mildly obstructing stone, mild cardiomegaly with bilateral pleural effusions CT Head 2/15 >> no acute process, old right MCA infarct with encephalomalacia   CULTURES: BCx2 2/14 >> GNR >>  BCID 2/14 >> enterobacter / ecoli detected  UC 2/14 >>   ANTIBIOTICS: Meropenem 2/15 >>   SIGNIFICANT EVENTS: 2/13  Presented to [facility] with concern for CVA, found to have obstructing R renal stone  2/14  Tx to [facility] for Urology evaluation   LINES/TUBES:  DISCUSSION: 62 y/o F with prior CVA, central sleep apnea, SNF resident, admitted 2/13 with obstructing right renal stone and concern for urosepsis.  CVA ruled out.  S/P stent 2/14.  Developed hypotension around 0400 on 2/15, PCCM consulted   ASSESSMENT / PLAN:  PULMONARY A: Central Sleep Apnea  Mild Hypercarbia  P:   QHS & PRN CPAP  Minimize sedating medications   CARDIOVASCULAR A:  Severe Sepsis - in setting of obstructive uropathy  Hypotension P:  ICU monitoring  Additional 1L NS bolus x1 now 1/2 NS @ 125 ml/hr Difficult to assess BP due to body habitus > recordings were on her wrist  ABX as above  Follow cultures   RENAL A:    AKI - secondary to obstructive uropathy, IV contrast, hypotension R Renal Calculi s/p Stent - 2/14  P:   Trend BMP / urinary output Replace electrolytes as indicated Avoid nephrotoxic agents, ensure adequate renal perfusion  HEMATOLOGIC A:   Chronic Thrombocytopenia - baseline approximately 50-60, thought to be related to Depakote  P:  Trend CBC  Monitor for bleeding  SCD's for DVT prophylaxis   NEUROLOGIC A:   Acute Metabolic Encephalopathy - ammonia wnl Hx CVA with Left Hemiparesis  P:   Supportive care  Discontinue sedating  medications   FAMILY  - Updates: No family at bedside pm 2/15    Noe Gens, NP-C [facility] Pgr: [phone] or if no answer [phone] 04/23/2017, 2:43 PM

## 2017-04-23 NOTE — Progress Notes (Addendum)
Patients BP 74/37 RN updated Dr. Tawanna Solo on patients condition, received an order for a liter bolus.  Will continue to monitor.

## 2017-04-23 NOTE — Progress Notes (Signed)
Pharmacy Antibiotic Note  [name] is a 62 y.o. female admitted on 04/21/2017 with bacteremia.  Pharmacy has been consulted for Meropenem dosing.  Plan: Meropenem 1gm iv q12hr  Height: 5' 1.81" (157 cm) Weight: 215 lb (97.5 kg) IBW/kg (Calculated) : 49.67  Temp (24hrs), Avg:100.3 F (37.9 C), Min:99.9 F (37.7 C), Max:100.6 F (38.1 C)  Recent Labs  Lab 04/21/17 1107 04/21/17 1121 04/22/17 0449 04/22/17 0900 04/22/17 2308  WBC 12.6*  --  19.0*  --  21.4*  CREATININE 2.04* 2.00* 2.69*  --  2.55*  LATICACIDVEN  --   --   --  2.0* 1.9    Estimated Creatinine Clearance: 25.2 mL/min (A) (by C-G formula based on SCr of 2.55 mg/dL (H)).    Allergies  Allergen Reactions  . Codeine Other (See Comments)    unknown  . Lacosamide Other (See Comments)    Mood changes/agitation No rx noted    . Morphine Other (See Comments)    No rx noted    . Zolpidem     Antimicrobials this admission: Zosyn 2/14 >>2/14 Meropenem 2/15 >>  Dose adjustments this admission: -  Microbiology results: pending  Thank you for allowing pharmacy to be a part of this patient's care.  [name] 04/23/2017 2:02 AM

## 2017-04-23 NOTE — Consult Note (Signed)
Referring Provider: No ref. provider found Primary Care Physician:  Hilbert Corrigan, MD Primary Nephrologist:    Reason for Consultation:  Acute renal failure  HPI: 62 year old female transferred here from [facility] for cystoscopy with right ureteral stent placement for urosepsis. She was found to have an obstructing right calculus  She was hypotensive in the PACU She continues on zosyn IV antibiotics   Creatinine increased to 2.04 on admission   Creatinine has now increased to 2.69     Urinalysis was cloudy with some proteinuria   Urine output poor    Blood pressure fairly marginal  She appears volume depleted     Past Medical History:  Diagnosis Date  . Acute respiratory failure ([place]) 07/27/2012  . Arthritis    OSTEO LEFT LEG  . Chronic pain   . CVA (cerebral vascular accident) ([place])    left sided hemiparesis  . Depression   . Diabetes mellitus without complication ([place])   . Hemiparesis affecting left side as late effect of cerebrovascular accident ([place])   . Hypertension   . Metabolic encephalopathy 77/41/2878  . Narcotic overdose ([place]) 07/27/2012  . Respiratory failure ([place]) 05/06/10  . Seizure disorder ([place])   . Seizures ([place])   . Sleep apnea 04/2010   on CPAP, "severe central sleep apnea"  . Sleep apnea   . Spasticity    chronic  . Thrombocytopenia ([employer])    related to depakote  . Thrombocytopenia, unspecified ([place]) 07/28/2012  . Unspecified hypothyroidism   . 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 UTI (lower urinary tract infection) 05/06/10    Past Surgical History:  Procedure Laterality Date  . CEREBRAL ANEURYSM REPAIR  1999  . CYSTOSCOPY W/ URETERAL STENT PLACEMENT Bilateral 04/22/2017   Procedure: CYSTOSCOPY WITH BILATERAL  RETROGRADE PYELOGRAM, RIGHT URETERAL STENT PLACEMENT;  Surgeon: Festus Aloe, MD;  Location: [facility];  Service: Urology;  Laterality: Bilateral;  . INTRATHECAL PUMP IMPLANTATION  2010   [employer]:  fentanyl, baclofen changed to  morphine and baclofen on 07/2012    Prior to Admission medications   Medication Sig Start Date End Date Taking? Authorizing Provider  acetaminophen (TYLENOL) 160 MG/5ML suspension Take 960 mg by mouth every 6 (six) hours as needed for moderate pain or fever.   Yes [provider]  amLODipine (NORVASC) 5 MG tablet Take 5 mg by mouth daily.   Yes [provider]  aspirin EC 81 MG tablet Take 81 mg by mouth daily.   Yes [provider]  cloNIDine (CATAPRES) 0.1 MG tablet Take 0.1 mg by mouth every 6 (six) hours as needed (for SBP greater than 160).   Yes [provider]  divalproex (DEPAKOTE) 125 MG DR tablet Take 125 mg by mouth 2 (two) times daily.   Yes [provider]  divalproex (DEPAKOTE) 500 MG DR tablet Take 500 mg by mouth 2 (two) times daily.   Yes [provider]  DULoxetine (CYMBALTA) 20 MG capsule Take 20 mg by mouth daily.   Yes [provider]  labetalol (NORMODYNE) 200 MG tablet Take 200 mg by mouth 2 (two) times daily.   Yes [provider]  lactulose, encephalopathy, (CHRONULAC) 10 GM/15ML SOLN Take 20 g by mouth daily as needed.   Yes [provider]  levETIRAcetam (KEPPRA) 1000 MG tablet Take 1,000 mg by mouth daily.   Yes [provider]  levETIRAcetam (KEPPRA) 500 MG tablet Take 1,500 mg by mouth at bedtime.   Yes [provider]  levothyroxine (SYNTHROID, [place])  100 MCG tablet Take 100 mcg by mouth daily before breakfast.   Yes [provider]  lidocaine (LIDODERM) 5 % Place 1 patch onto the skin daily. Remove & Discard patch within 12 hours or as directed by MD   Yes [provider]  loratadine (CLARITIN) 10 MG tablet Take 10 mg by mouth daily.   Yes [provider]  losartan (COZAAR) 50 MG tablet Take 50 mg by mouth at bedtime.   Yes [provider]  Menthol, Topical Analgesic, 5 % GEL Apply 1 application topically 4 (four) times daily as  needed.   Yes [provider]  Multiple Vitamin (MULTIVITAMIN WITH MINERALS) TABS tablet Take 1 tablet by mouth daily.   Yes [provider]  nystatin (NYSTATIN) powder Apply 1 g topically 2 (two) times daily.   Yes [provider]  nystatin cream (MYCOSTATIN) Apply 1 application topically 2 (two) times daily.   Yes [provider]  Olopatadine HCl (PATADAY) 0.2 % SOLN Place 1 drop into both eyes at bedtime.   Yes [provider]  omeprazole (PRILOSEC) 20 MG capsule Take 20 mg by mouth daily.   Yes [provider]  oxyCODONE (OXY IR/ROXICODONE) 5 MG immediate release tablet Take 5 mg by mouth every 8 (eight) hours as needed for pain (for moderate to severe pain).   Yes [provider]  Polyethyl Glycol-Propyl Glycol (SYSTANE) 0.4-0.3 % SOLN Place 1 drop into both eyes 3 (three) times daily as needed.   Yes [provider]  polyethylene glycol (MIRALAX / GLYCOLAX) packet Take 17 g by mouth daily.   Yes [provider]  rosuvastatin (CRESTOR) 5 MG tablet Take 5 mg by mouth at bedtime.   Yes [provider]  traZODone (DESYREL) 50 MG tablet Take 50 mg by mouth at bedtime.   Yes [provider]  Vitamin D, Ergocalciferol, (DRISDOL) 50000 units CAPS capsule Take 50,000 Units by mouth every 30 (thirty) days.   Yes [provider]  DULoxetine (CYMBALTA) 60 MG capsule Take 60 mg by mouth daily.    [provider]    Current Facility-Administered Medications  Medication Dose Route Frequency Provider Last Rate Last Dose  . 0.45 % sodium chloride infusion   Intravenous Continuous Marene Lenz, MD 125 mL/hr at 04/23/17 0958    . acetaminophen (TYLENOL) tablet 650 mg  650 mg Oral Q4H PRN Manuella Ghazi, Pratik D, DO       Or  . acetaminophen (TYLENOL) solution 650 mg  650 mg Per Tube Q4H PRN Manuella Ghazi, Pratik D, DO       Or  . acetaminophen (TYLENOL) suppository 650 mg  650 mg Rectal Q4H PRN Heath Lark D, DO   650 mg at 04/23/17 0750  . divalproex (DEPAKOTE) DR tablet 125 mg  125 mg Oral BID Manuella Ghazi, Pratik D, DO      . divalproex (DEPAKOTE) DR tablet 500 mg  500 mg Oral BID Manuella Ghazi, Pratik D, DO      . levETIRAcetam (KEPPRA) tablet 1,000 mg  1,000 mg Oral Daily Adhikari Bk, Amrit, MD      . levETIRAcetam (KEPPRA) tablet 1,500 mg  1,500 mg Oral QHS Shah, Pratik D, DO      . levothyroxine (SYNTHROID, LEVOTHROID) tablet 100 mcg  100 mcg Oral QAC breakfast Manuella Ghazi, Pratik D, DO      . meropenem (MERREM) 1 g in sodium chloride 0.9 % 100 mL IVPB  1 g Intravenous Q12H Phillips Grout, MD  Stopped at 04/23/17 1036  . nystatin (MYCOSTATIN/NYSTOP) topical powder 1 g  1 g Topical BID Manuella Ghazi, Pratik D, DO   1 g at 04/23/17 1057  . nystatin cream (MYCOSTATIN) 1 application  1 application Topical BID Heath Lark D, DO   1 application at 96/22/29 1058  . olopatadine (PATANOL) 0.1 % ophthalmic solution 1 drop  1 drop Both Eyes BID Manuella Ghazi, Pratik D, DO   1 drop at 04/23/17 1057  . pantoprazole (PROTONIX) injection 40 mg  40 mg Intravenous Q24H Adhikari Bk, Amrit, MD      . polyethylene glycol (MIRALAX / GLYCOLAX) packet 17 g  17 g Oral Daily Manuella Ghazi, Pratik D, DO   Stopped at 04/21/17 1937  . sodium chloride 0.9 % bolus 1,000 mL  1,000 mL Intravenous Once Noe Gens L, NP 2,000 mL/hr at 04/23/17 1328 1,000 mL at 04/23/17 1328    Allergies as of 04/21/2017 - Review Complete 04/21/2017  Allergen Reaction Noted  . Codeine Other (See Comments) 03/05/2011  . Lacosamide Other (See Comments) 01/17/2015  . Morphine Other (See Comments) 02/06/2015  . Zolpidem  02/06/2015    History reviewed. No pertinent family history.  Social History   Socioeconomic History  . Marital status: Married    Spouse name: Not on file  . Number of children: Not on file  . Years of education: Not on file  . Highest education level: Not on file  Social Needs  . Financial resource strain: Not on file  . Food insecurity - worry: Not  on file  . Food insecurity - inability: Not on file  . Transportation needs - medical: Not on file  . Transportation needs - non-medical: Not on file  Occupational History  . Not on file  Tobacco Use  . Smoking status: Never Smoker  . Smokeless tobacco: Never Used  Substance and Sexual Activity  . Alcohol use: No  . Drug use: No  . Sexual activity: Not Currently  Other Topics Concern  . Not on file  Social History Narrative  . Not on file    Review of Systems: Unable to elicit  Physical Exam: Vital signs in last 24 hours: Temp:  [99.4 F (37.4 C)-101.3 F (38.5 C)] 100.3 F (37.9 C) (02/15 1200) Pulse Rate:  [103-121] 121 (02/15 1300) Resp:  [11-39] 39 (02/15 1300) BP: (74-143)/(37-83) 78/48 (02/15 1300) SpO2:  [89 %-100 %] 89 % (02/15 1300) Last BM Date: 04/23/17 General:   Obese and sedated  Head:  Normocephalic and atraumatic. Eyes:  Sclera clear, no icterus.   Conjunctiva pink. Ears:  Normal auditory acuity. Nose:  No deformity, discharge,  or lesions. Mouth:  No deformity or lesions, dentition normal. Neck:  Supple; no masses or thyromegaly. JVP not elevated Lungs:  Clear throughout to auscultation.   No wheezes, crackles, or rhonchi. No acute distress. Heart:  Regular rate and rhythm; no murmurs, clicks, rubs,  or gallops. Abdomen:  Soft, nontender and nondistended. No masses, hepatosplenomegaly or hernias noted. Normal bowel sounds, without guarding, and without rebound.   Msk:  Symmetrical without gross deformities. Normal posture. Pulses:  No carotid, renal, femoral bruits. DP and PT symmetrical and equal Extremities:  Without clubbing or edema. Neurologic:  Alert and  oriented x4;  grossly normal neurologically. Skin:  Intact without significant lesions or rashes. Cervical Nodes:  No significant cervical adenopathy. Psych:  Alert and cooperative. Normal mood and affect.  Intake/Output from previous day: 02/14 0701 - 02/15 0700 In: 2751.7 [I.V.:2701.7; IV  Piggyback:50] Out: 245 [Urine:245] Intake/Output this shift: Total I/O In: 300 [Other:300] Out: -   Lab Results: Recent Labs    04/22/17 0449 04/22/17 2308 04/23/17 0802  WBC 19.0* 21.4* 20.2*  HGB 13.3 12.4 11.7*  HCT 43.3 39.2 37.1  PLT 50* 43* 38*   BMET Recent Labs    04/22/17 0449 04/22/17 2308 04/23/17 0802  NA 146* 149* 149*  K 4.4 4.1 4.0  CL 108 112* 115*  CO2 24 22 23   GLUCOSE 69 91 86  BUN 39* 48* 52*  CREATININE 2.69* 2.55* 2.57*  CALCIUM 8.4* 7.6* 7.1*   LFT Recent Labs    04/23/17 0802  PROT 5.8*  ALBUMIN 2.5*  AST 42*  ALT 15  ALKPHOS 58  BILITOT 0.6   PT/INR Recent Labs    04/21/17 1107  LABPROT 15.3*  INR 1.22   Hepatitis Panel No results for input(s): HEPBSAG, HCVAB, HEPAIGM, HEPBIGM in the last 72 hours.  Studies/Results: Ct Abdomen Pelvis Wo Contrast  Result Date: 04/22/2017 CLINICAL DATA:  Recurrent urinary tract infection. Diabetes. Hydronephrosis seen on recent ultrasound. EXAM: CT ABDOMEN AND PELVIS WITHOUT CONTRAST TECHNIQUE: Multidetector CT imaging of the abdomen and pelvis was performed following the standard protocol without IV contrast. COMPARISON:  04/22/2017 FINDINGS: Despite efforts by the technologist and patient, motion artifact is present on today's exam and could not be eliminated. This reduces exam sensitivity and specificity. Lower chest: Mild cardiomegaly with small bilateral pleural effusions and passive atelectasis in the lung bases. Hepatobiliary: Dependent density in the gallbladder could be sludge or gallstones. Otherwise unremarkable. Pancreas: Unremarkable Spleen: Unremarkable Adrenals/Urinary Tract: The adrenal glands appear normal. There is abnormal bilateral delayed nephrogram with contrast medium in both collecting systems, ureters, and the mostly empty urinary bladder (Foley catheter in place). There is right hydronephrosis along with asymmetric abnormal stranding around the right renal collecting system.  Patchy variable contrast in the left kidney noted. With the use of bone window in, the contrast in the renal collecting systems can be somewhat differentiated from the calculi. There is a central left renal collecting system calculus measuring 1.3 by 0.9 cm on image 40/2. Multiple nonobstructive left renal collecting system calculi are shown for example on image 33/2 using bone windows. Multiple clustered calcifications in the left kidney lower pole are also present. Particularly on the left side. Stomach/Bowel: There is an unusual appearance of the rectum, with marked wall thickening in the lower rectum circumferentially, and a patulous anus. Vascular/Lymphatic: Scattered small retroperitoneal lymph nodes are present. These may be reactive. Aortoiliac atherosclerotic vascular disease. Reproductive: Multiple uterine fibroids, subserosal and intramural, similar contour to the prior exam. Other: No supplemental non-categorized findings. Musculoskeletal: High density along the margins of the inferior thecal sac on image 64/6, possibly calcification or contrast, less likely blood products. An intrathecal pump is present. IMPRESSION: 1. Mild right hydronephrosis related to a 1.3 by 0.9 cm right collecting system stone which is likely mildly obstructing the right UPJ. 2. Abnormal delayed nephrograms compatible with acute renal injury. This contrast was administered yesterday and is still in the kidneys. There is also some excreted contrast in the collecting systems and ureters. 3. Greater degree of heterogeneity in the left kidney than the right with regard to the contrast distribution and appearance, along with left perirenal stranding somewhat greater than the right. I cannot exclude pyelonephritis given this appearance, but the appearance is not considered specific given the difference in degree of obstruction and the overall unusual situation of renal contrast  1 day after administration. 4. Mild cardiomegaly with  bilateral pleural effusions. 5. Abnormal appearance of the rectum with considerable distal rectal wall thickening and patulous anus. 6. There is high density in the thecal sac inferiorly, probably calcification. 7. Other imaging findings of potential clinical significance: Uterine fibroids. Possible gallstones. Aortic Atherosclerosis (ICD10-I70.0). Electronically Signed   By: Van Clines M.D.   On: 04/22/2017 17:06   Ct Head Wo Contrast  Result Date: 04/23/2017 CLINICAL DATA:  Altered level consciousness EXAM: CT HEAD WITHOUT CONTRAST TECHNIQUE: Contiguous axial images were obtained from the base of the skull through the vertex without intravenous contrast. COMPARISON:  None. FINDINGS: Brain: No evidence of acute infarction, hemorrhage, extra-axial collection, ventriculomegaly, or mass effect. Old, large right MCA territory infarct with encephalomalacia. Generalized cerebral atrophy. Periventricular white matter low attenuation likely secondary to microangiopathy. Vascular: Cerebrovascular atherosclerotic calcifications are noted. 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 Skull: Negative for fracture or focal lesion. Sinuses/Orbits: Visualized portions of the orbits are unremarkable. Visualized portions of the paranasal sinuses and mastoid air cells are unremarkable. Other: None. IMPRESSION: 1. No acute intracranial pathology. 2. Old right MCA territory infarct with encephalomalacia. Electronically Signed   By: Kathreen Devoid   On: 04/23/2017 13:16   US Renal  Result Date: 04/22/2017 CLINICAL DATA:  Acute renal injury. EXAM: RENAL / URINARY TRACT ULTRASOUND COMPLETE COMPARISON:  Ultrasound 04/21/2017. FINDINGS: Right Kidney: Length: 10.2 cm. Echogenicity within normal limits. No mass. Mild hydronephrosis. 11 mm renal pelvic stone. Left Kidney: Length: 10.6 cm. Echogenicity within normal limits. No mass or hydronephrosis visualized. Limited evaluation due to overlying bowel gas, patient's  body habitus, and positioning difficulty. Bladder: Bladder is nondistended. By history the patient has a Foley catheter. IMPRESSION: 1. 11 mm right renal pelvic stone. Mild hydronephrosis. Stable exam. No change from prior study of 04/21/2017. 2. Limited evaluation of the left kidney due to overlying bowel gas, patient's body habitus, and positioning difficulty. Electronically Signed   By: Marcello Moores  Register   On: 04/22/2017 10:24   US Renal  Result Date: 04/21/2017 CLINICAL DATA:  Acute kidney injury. EXAM: RENAL / URINARY TRACT ULTRASOUND COMPLETE COMPARISON:  CT scan of the abdomen dated 01/06/2013 FINDINGS: Right Kidney: Length: 10.5 cm. 11 mm stone right renal pelvis. Minimal prominence of the calices. Renal pelvis is not distended. Echogenicity within normal limits. No mass visualized. Left Kidney: Length: 11.7 cm. Small stones in the lower pole. No hydronephrosis. Echogenicity within normal limits. No mass or hydronephrosis visualized. Bladder: Appears normal for degree of bladder distention. IMPRESSION: 1. Chronic bilateral renal calculi. 2. Minimal dilatation of the calices of the right kidney without dilatation of the renal pelvis or ureter. Electronically Signed   By: Lorriane Shire M.D.   On: 04/21/2017 17:26   Dg C-arm 1-60 Min-no Report  Result Date: 04/22/2017 Fluoroscopy was utilized by the requesting physician.  No radiographic interpretation.    Assessment/Plan:  Acute kidney injury in setting of shock and hypotension  The patient does appear volume depleted although urine sodium is 27 I think that this is Acute tubular Necrosis   There appears to have  been IV contrast administration  CT cerebral perfusion study  On 2/13 and this could also be contributory to the acute renal failure. I doubt that this represents Acute interstitial nephritis or acute glomerulonephritis.  Hypertension/volume appears to be volume depleted and will need IV fluids   Anemia stable  Hb 11.7   Amtibiotics  continue meropenum 1g q 12  hours    LOS: 2 Ayme Short W @TODAY @1 :29 PM

## 2017-04-23 NOTE — Progress Notes (Incomplete)
CRITICAL VALUE ALERT  Critical Value:  0.07  Date & Time Notied:  04/23/17 1746  Provider Notified: Dr. Tawanna Solo  Orders Received/Actions taken: ***

## 2017-04-23 NOTE — Care Management Note (Signed)
Case Management Note  Patient Details  Name: [name] MRN: [id number] Date of Birth: 1955-04-23  Subjective/Objective:                  sepsis  Action/Plan: Date: April 23, 2017 Velva Harman, BSN, [place], [place] [phone] Chart and notes review for patient progress and needs. Will follow for case management and discharge needs. No cm or discharge needs present at time of this review. Next review date: [id number]  Expected Discharge Date:                  Expected Discharge Plan:  Home/Self Care  In-House Referral:     Discharge planning Services  CM Consult  Post Acute Care Choice:    Choice offered to:     DME Arranged:    DME Agency:     HH Arranged:    HH Agency:     Status of Service:  In process, will continue to follow  If discussed at Long Length of Stay Meetings, dates discussed:    Additional Comments:  Leeroy Cha, RN 04/23/2017, 6:28 AM

## 2017-04-23 NOTE — Progress Notes (Addendum)
PROGRESS NOTE    [name]  FXT:[id number] DOB: 27-Jun-1955 DOA: 04/21/2017 PCP: Hilbert Corrigan, MD   Brief Narrative: Patient is a 62 year old female with past medical history of stroke, severe left-sided hemiparesis as well as neglect, seizure disorder, sleep apnea, hypertension, hypothyroidism, morbid obesity, chronic nephrolithiasis who was transferred from [facility].  Patient is a nursing home resident and is bedbound due to stroke several years ago.  She was initially sent to [facility] from the nursing home due to new strokelike symptoms with slurred speech and also for urinary tract infection.  Patient was also found to have significant acute kidney injury.  Patient was being followed by nephrology. Imagings showed mild right hydronephrosis and mildly obstructing stone on the right side.  Urology was consulted and urgent right renal decompression was planned due to her concomitant acute kidney injury and urinary tract infection and possible evolving sepsis.  She was transferred to [facility] long hospital for right sided urinary stent placement. Patient is currently septic and is in possible septic shock due to gram-negative bacteremia.  She also has severe metabolic encephalopathy most likely due to sepsis.  Assessment & Plan:   Principal Problem:   Severe sepsis with septic shock [facility]) Active Problems:   Morbid obesity ([place])   History of stroke   Thrombocytopenia, unspecified ([place])   Central sleep apnea   Hemiparesis affecting left side as late effect of cerebrovascular accident [facility])   Hypothyroidism   Seizure disorder ([place])   Lower urinary tract infectious disease   HTN (hypertension)   AKI (acute kidney injury) ([place])   CVA (cerebral vascular accident) ([place])  Severe sepsis with septic shock: Patient is febrile with leukocytosis.  Currently tachycardiac.  Blood pressure on the lower side.  Blood culture revealing E. coli.  Will follow final sensitivity report  .   We will check  pro-calcitonin. Currently on meropenem. Also consulted critical care today because of patient's severity of illness.  Blood pressure responded to IV fluids this morning.  If blood pressure does not improve, she might need pressure support. Patient  also has severe leukocytosis.Lactic acid now normal.  Altered mental status: Most likely combination of septic encephalopathy and underlying stroke. We will continue to monitor mental status.   CT head done today showed old right MCA territory infarct with encephalomalacia  Acute kidney injury: Her kidney function was reported to be normal 3 years ago.  No recent lab information found.  Nephrology was following her at [facility].  Nephrology has been consulted here.  Continue IV fluids.  Nephrolithiasis/pyelonephritis: Most likely the source of sepsis.  Continue antibiotics.  Urology was following.  Plan is to remove the stent and stone  as an outpatient . CT imaging showed:Right hydronephrosis, renal pelvic stone, bilateral abnormal delayed nephrograms compatible with acute renal injury, heterogeneity of the left kidney parenchyma possibly consistent with pyelonephritis  Hypernatremia: Continue half-normal saline.  Stroke: Has history of right-sided MCA stroke with dense left hemiparesis and neglect.  She is bedbound.  Patient also has residual facial droop. She was admitted to [facility] for new strokelike symptoms. CT  imagings done that did not show any new stroke.   Seizure disorder: On Keppra and Depakote at home.  We will continue those.  EEG did not show any seizure activity.  Thrombocytopenia:Chronic.  Most likely exacerbated by sepsis now .  We will continue to monitor the counts.  Hypothyroidism: Continue home Synthyroid.  Sleep apnea: Continue CPAP/BiPAP at night.  Hyperlipidemia: Continue statin    DVT prophylaxis: SCD Code Status: Full Family Communication: Discussed  with daughter on the  phone. Disposition Plan: Unknown at this time   Consultants: PCCM, nephrology, urology  Procedures: Stent placement on the right side  Antimicrobials: Meropenem since 04/23/17  Subjective: Patient seen and examined the bedside this morning.  She was hypotensive.  She is very confused and lethargic.  Responds while shaking her and calling her name. I discussed the current situation and plan with her daughter on phone. I have called critical care consult and also nephrology consultation.  Objective: Vitals:   04/23/17 0930 04/23/17 0958 04/23/17 1200 04/23/17 1300  BP: 112/70   (!) 78/48  Pulse: (!) 118   (!) 121  Resp:    (!) 39  Temp:  99.4 F (37.4 C) 100.3 F (37.9 C)   TempSrc:  Oral Axillary   SpO2: 93%   (!) 89%  Weight:      Height:        Intake/Output Summary (Last 24 hours) at 04/23/2017 1320 Last data filed at 04/23/2017 0600 Gross per 24 hour  Intake 2701.67 ml  Output 245 ml  Net 2456.67 ml   Filed Weights   04/21/17 1136  Weight: 97.5 kg (215 lb)    Examination:  General exam: Appears lethargic, morbidly obese HEENT:PERRL,Oral mucosa moist, Ear/Nose normal on gross exam Respiratory system: Bilateral decreased air entry on the bases Cardiovascular system: Sinus tachycardia, RRR. No JVD, murmurs, rubs, gallops or clicks. No pedal edema. Gastrointestinal system: Abdomen is nondistended, soft and nontender. No organomegaly or masses felt. Normal bowel sounds heard. Central nervous system: Not Alert and oriented,right facial droop , left-sided hemiparesis Extremities: No edema, no clubbing ,no cyanosis, distal peripheral pulses palpable. Skin: No rashes, lesions or ulcers,no icterus ,no pallor   Data Reviewed: I have personally reviewed following labs and imaging studies  CBC: Recent Labs  Lab 04/21/17 1107 04/21/17 1121 04/22/17 0449 04/22/17 2308 04/23/17 0802  WBC 12.6*  --  19.0* 21.4* 20.2*  NEUTROABS 10.2  --   --  16.8 17.4*  HGB 15.3*  15.6* 13.3 12.4 11.7*  HCT 49.8* 46.0 43.3 39.2 37.1  MCV 96.5  --  95.2 93.8 94.6  PLT 58*  --  50* 43* 38*   Basic Metabolic Panel: Recent Labs  Lab 04/21/17 1107 04/21/17 1121 04/22/17 0449 04/22/17 2308 04/23/17 0802  NA 146* 146* 146* 149* 149*  K 4.4 4.4 4.4 4.1 4.0  CL 107 109 108 112* 115*  CO2 21*  --  24 22 23   GLUCOSE 94 88 69 91 86  BUN 28* 26* 39* 48* 52*  CREATININE 2.04* 2.00* 2.69* 2.55* 2.57*  CALCIUM 9.5  --  8.4* 7.6* 7.1*   GFR: Estimated Creatinine Clearance: 25 mL/min (A) (by C-G formula based on SCr of 2.57 mg/dL (H)). Liver Function Tests: Recent Labs  Lab 04/21/17 1107 04/23/17 0802  AST 42* 42*  ALT 12* 15  ALKPHOS 66 58  BILITOT 0.9 0.6  PROT 7.3 5.8*  ALBUMIN 3.3* 2.5*   No results for input(s): LIPASE, AMYLASE in the last 168 hours. No results for input(s): AMMONIA in the last 168 hours. Coagulation Profile: Recent Labs  Lab 04/21/17 1107  INR 1.22   Cardiac Enzymes: No results for input(s): CKTOTAL, CKMB, CKMBINDEX, TROPONINI in the last 168 hours. BNP (last 3 results) No results for input(s): PROBNP in the last 8760 hours. HbA1C: Recent Labs    04/21/17 1107  HGBA1C  6.3*   CBG: Recent Labs  Lab 04/21/17 1054 04/22/17 1912 04/22/17 2147  GLUCAP 85 111* 98   Lipid Profile: Recent Labs    04/22/17 0449  CHOL 107  HDL 30*  LDLCALC 45  TRIG 160*  CHOLHDL 3.6   Thyroid Function Tests: No results for input(s): TSH, T4TOTAL, FREET4, T3FREE, THYROIDAB in the last 72 hours. Anemia Panel: No results for input(s): VITAMINB12, FOLATE, FERRITIN, TIBC, IRON, RETICCTPCT in the last 72 hours. Sepsis Labs: Recent Labs  Lab 04/22/17 0900 04/22/17 2308 04/23/17 0254  LATICACIDVEN 2.0* 1.9 1.6    Recent Results (from the past 240 hour(s))  Culture, Urine     Status: None (Preliminary result)   Collection Time: 04/22/17  3:10 AM  Result Value Ref Range Status   Specimen Description   Final    URINE,  CATHETERIZED Performed at [facility], [address], [place], [place] [address]    Special Requests   Final    NONE Performed at [facility], [address], [place], [place] [address]    Culture   Final    CULTURE REINCUBATED FOR BETTER GROWTH Performed at [facility], [place] [address], [place], [place] [address]    Report Status PENDING  Incomplete  Culture, blood (routine x 2)     Status: None (Preliminary result)   Collection Time: 04/22/17  8:02 AM  Result Value Ref Range Status   Specimen Description   Final    BLOOD RIGHT HAND Performed at [facility], [address], [place], [place] [address]    Special Requests   Final    BOTTLES DRAWN AEROBIC ONLY Blood Culture results may not be optimal due to an inadequate volume of blood received in culture bottles Performed at [facility], [address], [place], [place] [address]    Culture  Setup Time   Final    GRAM NEGATIVE RODS AEB ONLY Gram Stain Report Called to,Read Back By and Verified With: HENLEY,J @ 0600 ON 2.15.19 BY BOWMAN,L Performed at [facility], [address] [place], [place] [address]    Culture GRAM NEGATIVE RODS  Final   Report Status PENDING  Incomplete  Culture, blood (routine x 2)     Status: None (Preliminary result)   Collection Time: 04/22/17  9:00 AM  Result Value Ref Range Status   Specimen Description   Final    BLOOD LEFT HAND Performed at [facility], [address], [place], [place] [address]    Special Requests   Final    BOTTLES DRAWN AEROBIC AND ANAEROBIC Blood Culture adequate volume Performed at [facility], [address], [place], [place] [address]    Culture  Setup Time   Final    GRAM NEGATIVE RODS IN BOTH AEROBIC AND ANAEROBIC BOTTLES Gram Stain Report Called to,Read Back By and Verified With: HENLEY,J RN @ [facility] ON 12.15.19 BY BOWMAN,L CRITICAL RESULT CALLED TO, READ BACK BY AND VERIFIED WITH: Audrea Muscat [id number] MLM Performed at [facility], [place]  [address], [place], [place] [address]    Culture GRAM NEGATIVE RODS  Final   Report Status PENDING  Incomplete  Blood Culture ID Panel (Reflexed)     Status: Abnormal   Collection Time: 04/22/17  9:00 AM  Result Value Ref Range Status   Enterococcus species NOT DETECTED NOT DETECTED Final   Listeria monocytogenes NOT DETECTED NOT DETECTED Final   Staphylococcus species NOT DETECTED NOT DETECTED Final   Staphylococcus aureus  NOT DETECTED NOT DETECTED Final   Streptococcus species NOT DETECTED NOT DETECTED Final   Streptococcus agalactiae NOT DETECTED NOT DETECTED Final   Streptococcus pneumoniae NOT DETECTED NOT DETECTED Final   Streptococcus pyogenes NOT DETECTED NOT DETECTED Final   Acinetobacter baumannii NOT DETECTED NOT DETECTED Final   Enterobacteriaceae species DETECTED (A) NOT DETECTED Final    Comment: Enterobacteriaceae represent a large family of gram-negative bacteria, not a single organism. CRITICAL RESULT CALLED TO, READ BACK BY AND VERIFIED WITH: PHARMD J GRIMSLEY [id number] MLM    Enterobacter cloacae complex NOT DETECTED NOT DETECTED Final   Escherichia coli DETECTED (A) NOT DETECTED Final    Comment: CRITICAL RESULT CALLED TO, READ BACK BY AND VERIFIED WITH: PHARMD J GRIMSLEY [id number] MLM    Klebsiella oxytoca NOT DETECTED NOT DETECTED Final   Klebsiella pneumoniae NOT DETECTED NOT DETECTED Final   Proteus species NOT DETECTED NOT DETECTED Final   Serratia marcescens NOT DETECTED NOT DETECTED Final   Carbapenem resistance NOT DETECTED NOT DETECTED Final   Haemophilus influenzae NOT DETECTED NOT DETECTED Final   Neisseria meningitidis NOT DETECTED NOT DETECTED Final   Pseudomonas aeruginosa NOT DETECTED NOT DETECTED Final   Candida albicans NOT DETECTED NOT DETECTED Final   Candida glabrata NOT DETECTED NOT DETECTED Final   Candida krusei NOT DETECTED NOT DETECTED Final   Candida parapsilosis NOT DETECTED NOT DETECTED Final   Candida tropicalis NOT DETECTED NOT  DETECTED Final    Comment: Performed at [facility], [place]. [address], [place], [place] [address]         Radiology Studies: Ct Abdomen Pelvis Wo Contrast  Result Date: 04/22/2017 CLINICAL DATA:  Recurrent urinary tract infection. Diabetes. Hydronephrosis seen on recent ultrasound. EXAM: CT ABDOMEN AND PELVIS WITHOUT CONTRAST TECHNIQUE: Multidetector CT imaging of the abdomen and pelvis was performed following the standard protocol without IV contrast. COMPARISON:  04/22/2017 FINDINGS: Despite efforts by the technologist and patient, motion artifact is present on today's exam and could not be eliminated. This reduces exam sensitivity and specificity. Lower chest: Mild cardiomegaly with small bilateral pleural effusions and passive atelectasis in the lung bases. Hepatobiliary: Dependent density in the gallbladder could be sludge or gallstones. Otherwise unremarkable. Pancreas: Unremarkable Spleen: Unremarkable Adrenals/Urinary Tract: The adrenal glands appear normal. There is abnormal bilateral delayed nephrogram with contrast medium in both collecting systems, ureters, and the mostly empty urinary bladder (Foley catheter in place). There is right hydronephrosis along with asymmetric abnormal stranding around the right renal collecting system. Patchy variable contrast in the left kidney noted. With the use of bone window in, the contrast in the renal collecting systems can be somewhat differentiated from the calculi. There is a central left renal collecting system calculus measuring 1.3 by 0.9 cm on image 40/2. Multiple nonobstructive left renal collecting system calculi are shown for example on image 33/2 using bone windows. Multiple clustered calcifications in the left kidney lower pole are also present. Particularly on the left side. Stomach/Bowel: There is an unusual appearance of the rectum, with marked wall thickening in the lower rectum circumferentially, and a patulous anus.  Vascular/Lymphatic: Scattered small retroperitoneal lymph nodes are present. These may be reactive. Aortoiliac atherosclerotic vascular disease. Reproductive: Multiple uterine fibroids, subserosal and intramural, similar contour to the prior exam. Other: No supplemental non-categorized findings. Musculoskeletal: High density along the margins of the inferior thecal sac on image 64/6, possibly calcification or contrast, less likely blood products. An intrathecal pump is present. IMPRESSION: 1. Mild right  hydronephrosis related to a 1.3 by 0.9 cm right collecting system stone which is likely mildly obstructing the right UPJ. 2. Abnormal delayed nephrograms compatible with acute renal injury. This contrast was administered yesterday and is still in the kidneys. There is also some excreted contrast in the collecting systems and ureters. 3. Greater degree of heterogeneity in the left kidney than the right with regard to the contrast distribution and appearance, along with left perirenal stranding somewhat greater than the right. I cannot exclude pyelonephritis given this appearance, but the appearance is not considered specific given the difference in degree of obstruction and the overall unusual situation of renal contrast 1 day after administration. 4. Mild cardiomegaly with bilateral pleural effusions. 5. Abnormal appearance of the rectum with considerable distal rectal wall thickening and patulous anus. 6. There is high density in the thecal sac inferiorly, probably calcification. 7. Other imaging findings of potential clinical significance: Uterine fibroids. Possible gallstones. Aortic Atherosclerosis (ICD10-I70.0). Electronically Signed   By: Van Clines M.D.   On: 04/22/2017 17:06   Ct Head Wo Contrast  Result Date: 04/23/2017 CLINICAL DATA:  Altered level consciousness EXAM: CT HEAD WITHOUT CONTRAST TECHNIQUE: Contiguous axial images were obtained from the base of the skull through the vertex without  intravenous contrast. COMPARISON:  None. FINDINGS: Brain: No evidence of acute infarction, hemorrhage, extra-axial collection, ventriculomegaly, or mass effect. Old, large right MCA territory infarct with encephalomalacia. Generalized cerebral atrophy. Periventricular white matter low attenuation likely secondary to microangiopathy. Vascular: Cerebrovascular atherosclerotic calcifications are noted. 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 Skull: Negative for fracture or focal lesion. Sinuses/Orbits: Visualized portions of the orbits are unremarkable. Visualized portions of the paranasal sinuses and mastoid air cells are unremarkable. Other: None. IMPRESSION: 1. No acute intracranial pathology. 2. Old right MCA territory infarct with encephalomalacia. Electronically Signed   By: Kathreen Devoid   On: 04/23/2017 13:16   US Renal  Result Date: 04/22/2017 CLINICAL DATA:  Acute renal injury. EXAM: RENAL / URINARY TRACT ULTRASOUND COMPLETE COMPARISON:  Ultrasound 04/21/2017. FINDINGS: Right Kidney: Length: 10.2 cm. Echogenicity within normal limits. No mass. Mild hydronephrosis. 11 mm renal pelvic stone. Left Kidney: Length: 10.6 cm. Echogenicity within normal limits. No mass or hydronephrosis visualized. Limited evaluation due to overlying bowel gas, patient's body habitus, and positioning difficulty. Bladder: Bladder is nondistended. By history the patient has a Foley catheter. IMPRESSION: 1. 11 mm right renal pelvic stone. Mild hydronephrosis. Stable exam. No change from prior study of 04/21/2017. 2. Limited evaluation of the left kidney due to overlying bowel gas, patient's body habitus, and positioning difficulty. Electronically Signed   By: Marcello Moores  Register   On: 04/22/2017 10:24   US Renal  Result Date: 04/21/2017 CLINICAL DATA:  Acute kidney injury. EXAM: RENAL / URINARY TRACT ULTRASOUND COMPLETE COMPARISON:  CT scan of the abdomen dated 01/06/2013 FINDINGS: Right Kidney: Length: 10.5 cm. 11 mm  stone right renal pelvis. Minimal prominence of the calices. Renal pelvis is not distended. Echogenicity within normal limits. No mass visualized. Left Kidney: Length: 11.7 cm. Small stones in the lower pole. No hydronephrosis. Echogenicity within normal limits. No mass or hydronephrosis visualized. Bladder: Appears normal for degree of bladder distention. IMPRESSION: 1. Chronic bilateral renal calculi. 2. Minimal dilatation of the calices of the right kidney without dilatation of the renal pelvis or ureter. Electronically Signed   By: Lorriane Shire M.D.   On: 04/21/2017 17:26   Dg C-arm 1-60 Min-no Report  Result Date: 04/22/2017 Fluoroscopy was utilized  by the requesting physician.  No radiographic interpretation.        Scheduled Meds: . divalproex  125 mg Oral BID  . divalproex  500 mg Oral BID  . DULoxetine  20 mg Oral Daily  . levETIRAcetam  1,000 mg Oral Daily  . levETIRAcetam  1,500 mg Oral QHS  . levothyroxine  100 mcg Oral QAC breakfast  . loratadine  10 mg Oral Daily  . nystatin  1 g Topical BID  . nystatin cream  1 application Topical BID  . olopatadine  1 drop Both Eyes BID  . pantoprazole (PROTONIX) IV  40 mg Intravenous Q24H  . polyethylene glycol  17 g Oral Daily  . rosuvastatin  5 mg Oral QHS   Continuous Infusions: . sodium chloride 125 mL/hr at 04/23/17 0958  . meropenem (MERREM) IV Stopped (04/23/17 1036)     LOS: 2 days    Time spent: 50 mins     Frederick Klinger Jodie Echevaria, MD [facility] Pager [phone]  If 7PM-7AM, please contact night-coverage [email or web address] Password [email or web address] 04/23/2017, 1:20 PM

## 2017-04-23 NOTE — Progress Notes (Signed)
PHARMACY - PHYSICIAN COMMUNICATION CRITICAL VALUE ALERT - BLOOD CULTURE IDENTIFICATION (BCID)  [name] is an 62 y.o. female who presented to [facility] on 04/21/2017 with a chief complaint of bacteremia/obstructed pyelonephritis  Assessment:  Patient with bacteremia from obstructed pyelonephritis, patient is from [facility] with higher incidence of ESBL. (include suspected source if known)  Name of physician (or Provider) Contacted: Dr. Myna Hidalgo  Current antibiotics: Meropenem 1gm iv q12hr  Changes to prescribed antibiotics recommended:  Patient is on recommended antibiotics - No changes needed  Results for orders placed or performed during the hospital encounter of 04/21/17  Blood Culture ID Panel (Reflexed) (Collected: 04/22/2017  9:00 AM)  Result Value Ref Range   Enterococcus species NOT DETECTED NOT DETECTED   Listeria monocytogenes NOT DETECTED NOT DETECTED   Staphylococcus species NOT DETECTED NOT DETECTED   Staphylococcus aureus NOT DETECTED NOT DETECTED   Streptococcus species NOT DETECTED NOT DETECTED   Streptococcus agalactiae NOT DETECTED NOT DETECTED   Streptococcus pneumoniae NOT DETECTED NOT DETECTED   Streptococcus pyogenes NOT DETECTED NOT DETECTED   Acinetobacter baumannii NOT DETECTED NOT DETECTED   Enterobacteriaceae species DETECTED (A) NOT DETECTED   Enterobacter cloacae complex NOT DETECTED NOT DETECTED   Escherichia coli DETECTED (A) NOT DETECTED   Klebsiella oxytoca NOT DETECTED NOT DETECTED   Klebsiella pneumoniae NOT DETECTED NOT DETECTED   Proteus species NOT DETECTED NOT DETECTED   Serratia marcescens NOT DETECTED NOT DETECTED   Carbapenem resistance NOT DETECTED NOT DETECTED   Haemophilus influenzae NOT DETECTED NOT DETECTED   Neisseria meningitidis NOT DETECTED NOT DETECTED   Pseudomonas aeruginosa NOT DETECTED NOT DETECTED   Candida albicans NOT DETECTED NOT DETECTED   Candida glabrata NOT DETECTED NOT DETECTED   Candida krusei NOT DETECTED NOT  DETECTED   Candida parapsilosis NOT DETECTED NOT DETECTED   Candida tropicalis NOT DETECTED NOT DETECTED    [name] 04/23/2017  6:07 AM

## 2017-04-23 NOTE — Progress Notes (Signed)
Patient continues to be lethargic unable to remain awake for assessments.  Updated Dr. Tawanna Solo on patients ongoing lethargy received an order for ABG, Narcan, and stat head CT.  RN will continue to monitor.

## 2017-04-23 NOTE — Progress Notes (Signed)
SLP Cancellation Note  Patient Details Name: [name] MRN: [id number] DOB: [birth year]   Cancelled treatment:       Reason Eval/Treat Not Completed: Fatigue/lethargy limiting ability to participate  ST will continue to follow for swallowing evaluation as patient's alertness level allows.  Shelly Flatten, MA, [facility] SLP [phone]  Lamar Sprinkles 04/23/2017, 7:35 AM

## 2017-04-23 NOTE — Progress Notes (Signed)
  Echocardiogram 2D Echocardiogram has been performed.  Ina Poupard T Lennox Dolberry 04/23/2017, 3:47 PM

## 2017-04-23 NOTE — Progress Notes (Signed)
Rt placed pt on CPAP/AUTO with 4 LPM bleed in. Pt vitals stable.

## 2017-04-24 ENCOUNTER — Other Ambulatory Visit: Payer: Self-pay

## 2017-04-24 LAB — PROCALCITONIN: Procalcitonin: 26.47 ng/mL

## 2017-04-24 LAB — BASIC METABOLIC PANEL
Anion gap: 10 (ref 5–15)
BUN: 40 mg/dL — AB (ref 6–20)
CALCIUM: 6.9 mg/dL — AB (ref 8.9–10.3)
CO2: 20 mmol/L — AB (ref 22–32)
CREATININE: 1.57 mg/dL — AB (ref 0.44–1.00)
Chloride: 120 mmol/L — ABNORMAL HIGH (ref 101–111)
GFR calc Af Amer: 40 mL/min — ABNORMAL LOW (ref >60)
GFR, EST NON AFRICAN AMERICAN: 35 mL/min — AB (ref >60)
GLUCOSE: 75 mg/dL (ref 65–99)
Potassium: 4 mmol/L (ref 3.5–5.1)
Sodium: 150 mmol/L — ABNORMAL HIGH (ref 135–145)

## 2017-04-24 LAB — CBC WITH DIFFERENTIAL/PLATELET
BASOS PCT: 0 %
Basophils Absolute: 0 10*3/uL (ref 0.0–0.1)
Eosinophils Absolute: 0 10*3/uL (ref 0.0–0.7)
Eosinophils Relative: 0 %
HEMATOCRIT: 37.1 % (ref 36.0–46.0)
Hemoglobin: 11.5 g/dL — ABNORMAL LOW (ref 12.0–15.0)
LYMPHS ABS: 3.3 10*3/uL (ref 0.7–4.0)
LYMPHS PCT: 17 %
MCH: 29.7 pg (ref 26.0–34.0)
MCHC: 31 g/dL (ref 30.0–36.0)
MCV: 95.9 fL (ref 78.0–100.0)
MONOS PCT: 11 %
Monocytes Absolute: 2.1 10*3/uL — ABNORMAL HIGH (ref 0.1–1.0)
NEUTROS ABS: 14.1 10*3/uL — AB (ref 1.7–7.7)
Neutrophils Relative %: 72 %
Platelets: 39 10*3/uL — ABNORMAL LOW (ref 150–400)
RBC: 3.87 MIL/uL (ref 3.87–5.11)
RDW: 17.1 % — AB (ref 11.5–15.5)
WBC: 19.5 10*3/uL — ABNORMAL HIGH (ref 4.0–10.5)

## 2017-04-24 LAB — AMMONIA: Ammonia: 27 umol/L (ref 9–35)

## 2017-04-24 LAB — TROPONIN I: TROPONIN I: 0.09 ng/mL — AB (ref <0.03)

## 2017-04-24 MED ORDER — OXYBUTYNIN CHLORIDE 5 MG PO TABS: 5.0000 mg | ORAL_TABLET | Freq: Three times a day (TID) | ORAL | Status: DC | PRN

## 2017-04-24 MED ORDER — DEXTROSE 5 % IV SOLN
INTRAVENOUS | Status: DC
  Administered 2017-04-24 – 2017-04-26 (×5): via INTRAVENOUS
  Administered 2017-04-26: 125 mL via INTRAVENOUS
  Administered 2017-04-27 – 2017-04-28 (×4): via INTRAVENOUS

## 2017-04-24 NOTE — Progress Notes (Signed)
Notified earlier by primary RN that pt needed an NIH Scale, by the time I was able to do an NIH scale it was close to 0400am, pt very lethargic, but able to open eyes briefly to commmands and grip strength is weak but equal, otherwise pt not compliant with my other request.  Unable to complete at this time due to pt's decreased LOC, will inform NP on-call.  From previous documentation, pt has been lethargic during the day also.  Primary nurse has advised that her LOC has been the same for this shift.  [place] Coordinator [facility]/SD / Rapid Response Nurse

## 2017-04-24 NOTE — Progress Notes (Signed)
[facility] ROUNDING NOTE   Subjective:   : 62 year old female transferred here from [facility] for cystoscopy with right ureteral stent placement for urosepsis. She was found to have an obstructing right calculus  She was hypotensive in the PACU She continues on zosyn IV antibiotics   Creatinine increased to 2.04 on admission     Appears to be doing better  Creatinine now decreased to 1.57  Urinalysis was cloudy with some proteinuria   Urine output improved  Hypotension resolved with hydration  Objective:  Vital signs in last 24 hours:  Temp:  [98.4 F (36.9 C)-101.2 F (38.4 C)] 99 F (37.2 C) (02/16 0810) Pulse Rate:  [92-121] 102 (02/16 0856) Resp:  [9-39] 18 (02/16 0856) BP: (78-138)/(45-78) 108/60 (02/16 0800) SpO2:  [87 %-98 %] 98 % (02/16 0856) Weight:  [271 lb 6.2 oz (123.1 kg)] 271 lb 6.2 oz (123.1 kg) (02/16 0400)  Weight change:  Filed Weights   04/21/17 1136 04/24/17 0400  Weight: 215 lb (97.5 kg) 271 lb 6.2 oz (123.1 kg)    Intake/Output: I/O last 3 completed shifts: In: 4380.8 [I.V.:2805.8; Other:475; IV Piggyback:1100] Out: 1145 [Urine:1145]   Intake/Output this shift:  No intake/output data recorded.  CVS- RRR RS- CTA ABD- BS present soft non-distended EXT- no edema   Basic Metabolic Panel: Recent Labs  Lab 04/21/17 1107 04/21/17 1121 04/22/17 0449 04/22/17 2308 04/23/17 0802 04/24/17 0306  NA 146* 146* 146* 149* 149* 150*  K 4.4 4.4 4.4 4.1 4.0 4.0  CL 107 109 108 112* 115* 120*  CO2 21*  --  24 22 23  20*  GLUCOSE 94 88 69 91 86 75  BUN 28* 26* 39* 48* 52* 40*  CREATININE 2.04* 2.00* 2.69* 2.55* 2.57* 1.57*  CALCIUM 9.5  --  8.4* 7.6* 7.1* 6.9*    Liver Function Tests: Recent Labs  Lab 04/21/17 1107 04/23/17 0802  AST 42* 42*  ALT 12* 15  ALKPHOS 66 58  BILITOT 0.9 0.6  PROT 7.3 5.8*  ALBUMIN 3.3* 2.5*   No results for input(s): LIPASE, AMYLASE in the last 168 hours. Recent Labs  Lab 04/23/17 1342  04/24/17 0854  AMMONIA 15 27    CBC: Recent Labs  Lab 04/21/17 1107 04/21/17 1121 04/22/17 0449 04/22/17 2308 04/23/17 0802 04/24/17 0306  WBC 12.6*  --  19.0* 21.4* 20.2* 19.5*  NEUTROABS 10.2  --   --  16.8 17.4* 14.1*  HGB 15.3* 15.6* 13.3 12.4 11.7* 11.5*  HCT 49.8* 46.0 43.3 39.2 37.1 37.1  MCV 96.5  --  95.2 93.8 94.6 95.9  PLT 58*  --  50* 43* 38* 39*    Cardiac Enzymes: Recent Labs  Lab 04/23/17 1607 04/23/17 2032 04/24/17 0306  TROPONINI 0.07* 0.08* 0.09*    BNP: Invalid input(s): POCBNP  CBG: Recent Labs  Lab 04/21/17 1054 04/22/17 1912 04/22/17 2147  GLUCAP 85 111* 40    Microbiology: Results for orders placed or performed during the hospital encounter of 04/21/17  Culture, Urine     Status: Abnormal (Preliminary result)   Collection Time: 04/22/17  3:10 AM  Result Value Ref Range Status   Specimen Description   Final    URINE, CATHETERIZED Performed at [facility], [address], [place], [place] [address]    Special Requests   Final    NONE Performed at [facility], [address] [place],  [address]    Culture >=100,000 COLONIES/mL [place] (A)  Final   Report Status  PENDING  Incomplete  Culture, blood (routine x 2)     Status: None (Preliminary result)   Collection Time: 04/22/17  8:02 AM  Result Value Ref Range Status   Specimen Description   Final    BLOOD RIGHT HAND Performed at [facility], [address] [place], [place] [address]    Special Requests   Final    BOTTLES DRAWN AEROBIC ONLY Blood Culture results may not be optimal due to an inadequate volume of blood received in culture bottles Performed at [facility], [address] [place], [place] [address]    Culture  Setup Time   Final    GRAM NEGATIVE RODS AEB ONLY Gram Stain Report Called to,Read Back By and Verified With: HENLEY,J @ 0600 ON 2.15.19 BY BOWMAN,L Performed at [facility], [address], [place], [place] [address]    Culture GRAM  NEGATIVE RODS  Final   Report Status PENDING  Incomplete  Culture, blood (routine x 2)     Status: None (Preliminary result)   Collection Time: 04/22/17  9:00 AM  Result Value Ref Range Status   Specimen Description   Final    BLOOD LEFT HAND Performed at [facility], [address] [place], [place] [address]    Special Requests   Final    BOTTLES DRAWN AEROBIC AND ANAEROBIC Blood Culture adequate volume Performed at [facility], [address] [place], [place] [address]    Culture  Setup Time   Final    GRAM NEGATIVE RODS IN BOTH AEROBIC AND ANAEROBIC BOTTLES Gram Stain Report Called to,Read Back By and Verified With: HENLEY,J RN @ [facility] ON 12.15.19 BY BOWMAN,L CRITICAL RESULT CALLED TO, READ BACK BY AND VERIFIED WITH: Audrea Muscat [id number] MLM Performed at [facility], [place] [address], [place],  [address]    Culture GRAM NEGATIVE RODS  Final   Report Status PENDING  Incomplete  Blood Culture ID Panel (Reflexed)     Status: Abnormal   Collection Time: 04/22/17  9:00 AM  Result Value Ref Range Status   Enterococcus species NOT DETECTED NOT DETECTED Final   Listeria monocytogenes NOT DETECTED NOT DETECTED Final   Staphylococcus species NOT DETECTED NOT DETECTED Final   Staphylococcus aureus NOT DETECTED NOT DETECTED Final   Streptococcus species NOT DETECTED NOT DETECTED Final   Streptococcus agalactiae NOT DETECTED NOT DETECTED Final   Streptococcus pneumoniae NOT DETECTED NOT DETECTED Final   Streptococcus pyogenes NOT DETECTED NOT DETECTED Final   Acinetobacter baumannii NOT DETECTED NOT DETECTED Final   Enterobacteriaceae species DETECTED (A) NOT DETECTED Final    Comment: Enterobacteriaceae represent a large family of gram-negative bacteria, not a single organism. CRITICAL RESULT CALLED TO, READ BACK BY AND VERIFIED WITH: PHARMD J GRIMSLEY [id number] MLM    Enterobacter cloacae complex NOT DETECTED NOT DETECTED Final   Escherichia coli DETECTED (A) NOT  DETECTED Final    Comment: CRITICAL RESULT CALLED TO, READ BACK BY AND VERIFIED WITH: PHARMD J GRIMSLEY [id number] MLM    Klebsiella oxytoca NOT DETECTED NOT DETECTED Final   Klebsiella pneumoniae NOT DETECTED NOT DETECTED Final   Proteus species NOT DETECTED NOT DETECTED Final   Serratia marcescens NOT DETECTED NOT DETECTED Final   Carbapenem resistance NOT DETECTED NOT DETECTED Final   Haemophilus influenzae NOT DETECTED NOT DETECTED Final   Neisseria meningitidis NOT DETECTED NOT DETECTED Final   Pseudomonas aeruginosa NOT DETECTED NOT DETECTED Final   Candida albicans NOT DETECTED NOT DETECTED  Final   Candida glabrata NOT DETECTED NOT DETECTED Final   Candida krusei NOT DETECTED NOT DETECTED Final   Candida parapsilosis NOT DETECTED NOT DETECTED Final   Candida tropicalis NOT DETECTED NOT DETECTED Final    Comment: Performed at [facility], [place] [address] [place], [place] [address]    Coagulation Studies: Recent Labs    04/21/17 1107  LABPROT 15.3*  INR 1.22    Urinalysis: Recent Labs    04/22/17 0300 04/22/17 0956  COLORURINE AMBER* BROWN*  LABSPEC >1.046* 1.025  PHURINE 5.0 5.0  GLUCOSEU NEGATIVE NEGATIVE  HGBUR LARGE* LARGE*  BILIRUBINUR SMALL* MODERATE*  KETONESUR NEGATIVE TRACE*  PROTEINUR 100* >300*  NITRITE NEGATIVE POSITIVE*  LEUKOCYTESUR MODERATE* MODERATE*      Imaging: Ct Abdomen Pelvis Wo Contrast  Result Date: 04/22/2017 CLINICAL DATA:  Recurrent urinary tract infection. Diabetes. Hydronephrosis seen on recent ultrasound. EXAM: CT ABDOMEN AND PELVIS WITHOUT CONTRAST TECHNIQUE: Multidetector CT imaging of the abdomen and pelvis was performed following the standard protocol without IV contrast. COMPARISON:  04/22/2017 FINDINGS: Despite efforts by the technologist and patient, motion artifact is present on today's exam and could not be eliminated. This reduces exam sensitivity and specificity. Lower chest: Mild cardiomegaly with small bilateral  pleural effusions and passive atelectasis in the lung bases. Hepatobiliary: Dependent density in the gallbladder could be sludge or gallstones. Otherwise unremarkable. Pancreas: Unremarkable Spleen: Unremarkable Adrenals/Urinary Tract: The adrenal glands appear normal. There is abnormal bilateral delayed nephrogram with contrast medium in both collecting systems, ureters, and the mostly empty urinary bladder (Foley catheter in place). There is right hydronephrosis along with asymmetric abnormal stranding around the right renal collecting system. Patchy variable contrast in the left kidney noted. With the use of bone window in, the contrast in the renal collecting systems can be somewhat differentiated from the calculi. There is a central left renal collecting system calculus measuring 1.3 by 0.9 cm on image 40/2. Multiple nonobstructive left renal collecting system calculi are shown for example on image 33/2 using bone windows. Multiple clustered calcifications in the left kidney lower pole are also present. Particularly on the left side. Stomach/Bowel: There is an unusual appearance of the rectum, with marked wall thickening in the lower rectum circumferentially, and a patulous anus. Vascular/Lymphatic: Scattered small retroperitoneal lymph nodes are present. These may be reactive. Aortoiliac atherosclerotic vascular disease. Reproductive: Multiple uterine fibroids, subserosal and intramural, similar contour to the prior exam. Other: No supplemental non-categorized findings. Musculoskeletal: High density along the margins of the inferior thecal sac on image 64/6, possibly calcification or contrast, less likely blood products. An intrathecal pump is present. IMPRESSION: 1. Mild right hydronephrosis related to a 1.3 by 0.9 cm right collecting system stone which is likely mildly obstructing the right UPJ. 2. Abnormal delayed nephrograms compatible with acute renal injury. This contrast was administered yesterday and  is still in the kidneys. There is also some excreted contrast in the collecting systems and ureters. 3. Greater degree of heterogeneity in the left kidney than the right with regard to the contrast distribution and appearance, along with left perirenal stranding somewhat greater than the right. I cannot exclude pyelonephritis given this appearance, but the appearance is not considered specific given the difference in degree of obstruction and the overall unusual situation of renal contrast 1 day after administration. 4. Mild cardiomegaly with bilateral pleural effusions. 5. Abnormal appearance of the rectum with considerable distal rectal wall thickening and patulous anus. 6. There is high density in the thecal  sac inferiorly, probably calcification. 7. Other imaging findings of potential clinical significance: Uterine fibroids. Possible gallstones. Aortic Atherosclerosis (ICD10-I70.0). Electronically Signed   By: Van Clines M.D.   On: 04/22/2017 17:06   Ct Head Wo Contrast  Result Date: 04/23/2017 CLINICAL DATA:  Altered level consciousness EXAM: CT HEAD WITHOUT CONTRAST TECHNIQUE: Contiguous axial images were obtained from the base of the skull through the vertex without intravenous contrast. COMPARISON:  None. FINDINGS: Brain: No evidence of acute infarction, hemorrhage, extra-axial collection, ventriculomegaly, or mass effect. Old, large right MCA territory infarct with encephalomalacia. Generalized cerebral atrophy. Periventricular white matter low attenuation likely secondary to microangiopathy. Vascular: Cerebrovascular atherosclerotic calcifications are noted. 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 Skull: Negative for fracture or focal lesion. Sinuses/Orbits: Visualized portions of the orbits are unremarkable. Visualized portions of the paranasal sinuses and mastoid air cells are unremarkable. Other: None. IMPRESSION: 1. No acute intracranial pathology. 2. 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 Electronically Signed   By: Kathreen Devoid   On: 04/23/2017 13:16   Dg C-arm 1-60 Min-no Report  Result Date: 04/22/2017 Fluoroscopy was utilized by the requesting physician.  No radiographic interpretation.     Medications:   . sodium chloride 125 mL/hr at 04/24/17 0808  . meropenem (MERREM) IV Stopped (04/23/17 2154)   . divalproex  125 mg Oral BID  . divalproex  500 mg Oral BID  . levETIRAcetam  1,000 mg Oral Daily  . levETIRAcetam  1,500 mg Oral QHS  . levothyroxine  100 mcg Oral QAC breakfast  . nystatin  1 g Topical BID  . nystatin cream  1 application Topical BID  . olopatadine  1 drop Both Eyes BID  . pantoprazole (PROTONIX) IV  40 mg Intravenous Q24H  . polyethylene glycol  17 g Oral Daily   acetaminophen **OR** acetaminophen (TYLENOL) oral liquid 160 mg/5 mL **OR** acetaminophen  Assessment/ Plan:   Acute kidney injury in setting of shock and hypotension  The patient does appear volume depleted although urine sodium is 27 I think that this is Acute tubular Necrosis   There appears to have  been IV contrast administration  CT cerebral perfusion study  On 2/13 and this could also be contributory to the acute renal failure. I doubt that this represents Acute interstitial nephritis or acute glomerulonephritis.  Hypertension/volume appears to be volume depleted and will need IV fluids   Anemia stable  Hb 11.7   Amtibiotics continue meropenum 1g q 12 hours   Hypernatremia will change IVF to D5 W         LOS: 3 Jerrilyn Messinger W @TODAY @10 :16 AM

## 2017-04-24 NOTE — Evaluation (Signed)
Clinical/Bedside Swallow Evaluation Patient Details  Name: [name] MRN: [id number] Date of Birth: 21-Jul-1955  Today's Date: 04/24/2017 Time: SLP Start Time (ACUTE ONLY): 4098 SLP Stop Time (ACUTE ONLY): 1505 SLP Time Calculation (min) (ACUTE ONLY): 18 min  Past Medical History:  Past Medical History:  Diagnosis Date  . Acute respiratory failure ([place]) 07/27/2012  . Arthritis    OSTEO LEFT LEG  . Chronic pain   . CVA (cerebral vascular accident) ([place])    left sided hemiparesis  . Depression   . Diabetes mellitus without complication ([place])   . Hemiparesis affecting left side as late effect of cerebrovascular accident ([place])   . Hypertension   . Metabolic encephalopathy 11/91/4782  . Narcotic overdose ([place]) 07/27/2012  . Respiratory failure ([place]) 05/06/10  . Seizure disorder ([place])   . Sleep apnea 04/2010   on CPAP, "severe central sleep apnea"  . Spasticity    chronic  . Thrombocytopenia ([place])    related to depakote  . Unspecified hypothyroidism   . 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 UTI (lower urinary tract infection) 05/06/10   Past Surgical History:  Past Surgical History:  Procedure Laterality Date  . CEREBRAL ANEURYSM REPAIR  1999  . CYSTOSCOPY W/ URETERAL STENT PLACEMENT Bilateral 04/22/2017   Procedure: CYSTOSCOPY WITH BILATERAL  RETROGRADE PYELOGRAM, RIGHT URETERAL STENT PLACEMENT;  Surgeon: Festus Aloe, MD;  Location: [facility];  Service: Urology;  Laterality: Bilateral;  . INTRATHECAL PUMP IMPLANTATION  2010   [employer]:  fentanyl, baclofen changed to morphine and baclofen on 07/2012   HPI:  62 y.o. 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  She apparently was last known well at 830 this morning  during which time she was eating a banana.  She was checked on again at approximately 9:30 AM of slurred speech of acute onset CT head on 04/23/17 indicated No acute intracranial pathology; Old right MCA territory infarct with encephalomalacia  Assessment / Plan / Recommendation Clinical Impression   Pt with normal oropharyngeal swallow adequate for intake of regular/thin liquids despite residual left facial weakness from prior CVA; discussed placement of food on right and labial sweep for left side prn during PO intake; no overt s/s of aspiration noted during intake; pt's mentation improved from previous BSE at [facility] in [place] on 04/22/17; SLE not completed fully, but pt stated her "thinking is cloudy" and overall processing of information minimally delayed, but appropriate intelligible responses noted during informal conversation; ST will continue to f/u while in acute setting for diet tolerance and continued assessment of cognitive/language skills. SLP Visit Diagnosis: Dysphagia, unspecified (R13.10)    Aspiration Risk  Mild aspiration risk    Diet Recommendation   Regular/thin liquids  Medication Administration: Whole meds with liquid    Other  Recommendations Oral Care Recommendations: Oral care BID   Follow up Recommendations Skilled Nursing facility      Frequency and Duration min 2x/week  1 week       Prognosis Prognosis for Safe Diet Advancement: Good      Swallow Study   General Date of Onset: 04/21/17 HPI: 62 y.o. 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  She apparently was last known well at 830 this morning during which time she was eating a banana.  She was checked on again at  approximately 9:30 AM of slurred speech of acute onset Type of Study: Bedside Swallow Evaluation Previous Swallow Assessment: Previous BSE at [facility] failed d/t lethargy Diet Prior to this Study: NPO Temperature Spikes Noted: No Respiratory Status: Nasal cannula History of Recent Intubation: No Behavior/Cognition: Alert;Cooperative Oral Cavity Assessment: Dry Oral Care Completed by SLP: Recent completion by staff Oral Cavity - Dentition: Missing dentition Vision: Functional for self-feeding Self-Feeding Abilities: Needs assist Patient Positioning: Upright in bed Baseline Vocal Quality: Low vocal intensity Volitional Cough: Strong Volitional Swallow: Able to elicit    Oral/Motor/Sensory Function Overall Oral Motor/Sensory Function: Mild impairment Facial ROM: Reduced left Facial Symmetry: Abnormal symmetry left Facial Strength: Reduced left Lingual ROM: Within Functional Limits Lingual Symmetry: Abnormal symmetry left   Ice Chips Ice chips: Within functional limits Presentation: Spoon   Thin Liquid Thin Liquid: Within functional limits Presentation: Cup;Spoon;Straw    Nectar Thick Nectar Thick Liquid: Not tested   Honey Thick Honey Thick Liquid: Not tested   Puree Puree: Within functional limits Presentation: Spoon   Solid      Solid: Impaired Presentation: Spoon Oral Phase Impairments: Reduced lingual movement/coordination;Impaired mastication Oral Phase Functional Implications: Prolonged oral transit;Impaired mastication Other Comments: slow but thorough        Elvina Sidle, M.S., CCC-SLP 04/24/2017,3:19 PM

## 2017-04-24 NOTE — Progress Notes (Signed)
Pt/ placed on CPAP for h/s in auto mode >25/<5, humidifier refilled with SW, oxygen placed in circuit at 4 lpm per current n/c setting, tolerating well, RT to monitor.

## 2017-04-24 NOTE — Progress Notes (Signed)
PROGRESS NOTE    [name]  TGG:[id number] DOB: 1955-12-18 DOA: 04/21/2017 PCP: Hilbert Corrigan, MD   Assessment & Plan:   Principal Problem:   Severe sepsis with septic shock [facility]) Active Problems:   Morbid obesity (HCC)   History of stroke   Thrombocytopenia, unspecified ([place])   Central sleep apnea   Hemiparesis affecting left side as late effect of cerebrovascular accident [facility])   Hypothyroidism   Seizure disorder ([place])   Lower urinary tract infectious disease   HTN (hypertension)   AKI (acute kidney injury) ([place])   CVA (cerebral vascular accident) ([place])  Brief Narrative: Patient is a 62 year old female with past medical history of stroke, severe left-sided hemiparesis as well as neglect, seizure disorder, sleep apnea, hypertension, hypothyroidism, morbid obesity, chronic nephrolithiasis who resides at [facility] (bedbound) was transferred to [facility] from [facility] on 04/22/17 (admitted to [facility] on 04/21/17)  with concerns about altered mental status and sepsis. She was found to have an obstructing right calculus , on 04/22/17 underwent cystoscopy with right ureteral stent placement for urosepsis.    Plan:- 1)E Coli Severe sepsis with septic shock: Afebrile, leukocytosis persists, hypotension is resolved, tachycardia is mostly resolved,  pro-calcitonin is 26, c/n  meropenem for E. coli sepsis and bacteremia.  Urine culture negative so far  2)Altered mental status: Multifactorial, suspect due to toxic metabolic encephalopathy mostly due to combination of severe sepsis and uremia from renal dysfunction , CT head without acute findings , patient is more awake, passed a swallow eval at bedside on 04/24/2017   3)Acute kidney injury: Most likely due to hypotension in the setting of septic shock, compounded by recent Contrast study, Renal  function is improved, continue down to 1.5 from 2.0 , urine output is better,  Change  IV fluids to D5w due to hypernatremia and  hyperchloremia, discussed with Dr. Justin Mend from nephrology service  4)Nephrolithiasis/pyelonephritis: Most likely the source of sepsis.  on 04/22/17 underwent cystoscopy with right ureteral stent placement for urosepsis.   Plan is to remove the stent and stone  as an outpatient . CT imaging showed:Right hydronephrosis, renal pelvic stone, bilateral abnormal delayed nephrograms compatible with acute renal injury, heterogeneity of the left kidney parenchyma possibly consistent with pyelonephritis  5)H/o Stroke: Has history of prior right-sided MCA stroke with dense left hemiparesis and neglect.  She is bedbound.  Patient also has residual facial droop.  Clinical exam and repeat CTA without new acute findings   6)Seizure disorder:   EEG did not show any seizure activity, no evidence of new seizures, continue Keppra and Depakote  7)Thrombocytopenia:Chronic.  Most likely exacerbated by sepsis now .  We will continue to monitor the counts.  8)Hypothyroidism: Continue home Synthyroid.  9)Sleep apnea: Continue CPAP/BiPAP at night.    DVT prophylaxis: SCD Code Status: Full Disposition Plan: Unknown at this time   Consultants: PCCM, nephrology, urology  Procedures: Ureteral Stent placement on the right side on 04/22/17  Antimicrobials: Meropenem since 04/23/17  Subjective: Less lethargic, woke up well after BiPAP overnight, she started back at bedside, questions answered urine output is better  Objective: Vitals:   04/24/17 0856 04/24/17 1100 04/24/17 1145 04/24/17 1540  BP:  (!) 166/88    Pulse: (!) 102 99    Resp: 18 13    Temp:   98.6 F (37 C) 99 F (37.2 C)  TempSrc:   Oral Oral  SpO2: 98% 98%    Weight:      Height:  Intake/Output Summary (Last 24 hours) at 04/24/2017 1613 Last data filed at 04/24/2017 0600 Gross per 24 hour  Intake 425 ml  Output 900 ml  Net -475 ml   Filed Weights   04/21/17 1136 04/24/17 0400  Weight: 97.5 kg (215 lb) 123.1 kg (271 lb 6.2 oz)     Examination:  General exam: More awake morbidly obese HEENT:PERRL,Oral mucosa moist, Ear/Nose normal on gross exam Respiratory system: Decreased in bases, wheezes Cardiovascular system: Sinus tachycardia, RRR. No JVD, Gastrointestinal system: Abdomen is nondistended, soft and nontender. No organomegaly or masses felt. Normal bowel sounds heard. Central nervous system:  facial droop , left-sided hemiparesis unchanged  extremities: No edema,  distal peripheral pulses palpable. Skin: No rashes, lesions or ulcers,no icterus ,no pallor GU-Foley with clear urine   CBC: Recent Labs  Lab 04/21/17 1107 04/21/17 1121 04/22/17 0449 04/22/17 2308 04/23/17 0802 04/24/17 0306  WBC 12.6*  --  19.0* 21.4* 20.2* 19.5*  NEUTROABS 10.2  --   --  16.8 17.4* 14.1*  HGB 15.3* 15.6* 13.3 12.4 11.7* 11.5*  HCT 49.8* 46.0 43.3 39.2 37.1 37.1  MCV 96.5  --  95.2 93.8 94.6 95.9  PLT 58*  --  50* 43* 38* 39*   Basic Metabolic Panel: Recent Labs  Lab 04/21/17 1107 04/21/17 1121 04/22/17 0449 04/22/17 2308 04/23/17 0802 04/24/17 0306  NA 146* 146* 146* 149* 149* 150*  K 4.4 4.4 4.4 4.1 4.0 4.0  CL 107 109 108 112* 115* 120*  CO2 21*  --  24 22 23  20*  GLUCOSE 94 88 69 91 86 75  BUN 28* 26* 39* 48* 52* 40*  CREATININE 2.04* 2.00* 2.69* 2.55* 2.57* 1.57*  CALCIUM 9.5  --  8.4* 7.6* 7.1* 6.9*   GFR: Estimated Creatinine Clearance: 47 mL/min (A) (by C-G formula based on SCr of 1.57 mg/dL (H)). Liver Function Tests: Recent Labs  Lab 04/21/17 1107 04/23/17 0802  AST 42* 42*  ALT 12* 15  ALKPHOS 66 58  BILITOT 0.9 0.6  PROT 7.3 5.8*  ALBUMIN 3.3* 2.5*   No results for input(s): LIPASE, AMYLASE in the last 168 hours. Recent Labs  Lab 04/23/17 1342 04/24/17 0854  AMMONIA 15 27   Coagulation Profile: Recent Labs  Lab 04/21/17 1107  INR 1.22   Cardiac Enzymes: Recent Labs  Lab 04/23/17 1607 04/23/17 2032 04/24/17 0306  TROPONINI 0.07* 0.08* 0.09*   BNP (last 3  results) No results for input(s): PROBNP in the last 8760 hours. HbA1C: No results for input(s): HGBA1C in the last 72 hours. CBG: Recent Labs  Lab 04/21/17 1054 04/22/17 1912 04/22/17 2147  GLUCAP 85 111* 98   Lipid Profile: Recent Labs    04/22/17 0449  CHOL 107  HDL 30*  LDLCALC 45  TRIG 160*  CHOLHDL 3.6   Thyroid Function Tests: No results for input(s): TSH, T4TOTAL, FREET4, T3FREE, THYROIDAB in the last 72 hours. Anemia Panel: No results for input(s): VITAMINB12, FOLATE, FERRITIN, TIBC, IRON, RETICCTPCT in the last 72 hours. Sepsis Labs: Recent Labs  Lab 04/22/17 0900 04/22/17 2308 04/23/17 0254 04/23/17 1342 04/24/17 0306  PROCALCITON  --   --   --  48.65 26.47  LATICACIDVEN 2.0* 1.9 1.6  --   --     Recent Results (from the past 240 hour(s))  Culture, Urine     Status: Abnormal (Preliminary result)   Collection Time: 04/22/17  3:10 AM  Result Value Ref Range Status   Specimen Description   Final  URINE, CATHETERIZED Performed at [facility], [address], [place], [place] [address]    Special Requests   Final    NONE Performed at [facility], [address], [place], [place] [address]    Culture (A)  Final    >=100,000 COLONIES/mL ESCHERICHIA COLI SUSCEPTIBILITIES TO FOLLOW Performed at [place] [address], [place], [place] [address]    Report Status PENDING  Incomplete  Culture, blood (routine x 2)     Status: None (Preliminary result)   Collection Time: 04/22/17  8:02 AM  Result Value Ref Range Status   Specimen Description   Final    BLOOD RIGHT HAND Performed at [facility], [address], [place], [place] [address]    Special Requests   Final    BOTTLES DRAWN AEROBIC ONLY Blood Culture results may not be optimal due to an inadequate volume of blood received in culture bottles Performed at [facility], [address] [place], [place] [address]    Culture  Setup Time   Final    GRAM NEGATIVE RODS AEB ONLY Gram Stain  Report Called to,Read Back By and Verified With: HENLEY,J @ 0600 ON 2.15.19 BY BOWMAN,L Performed at [facility], [address], [place], [place] [address]    Culture GRAM NEGATIVE RODS  Final   Report Status PENDING  Incomplete  Culture, blood (routine x 2)     Status: Abnormal (Preliminary result)   Collection Time: 04/22/17  9:00 AM  Result Value Ref Range Status   Specimen Description   Final    BLOOD LEFT HAND Performed at [facility], [address], [place], [place] [address]    Special Requests   Final    BOTTLES DRAWN AEROBIC AND ANAEROBIC Blood Culture adequate volume Performed at [facility], [address] [place], [place] [address]    Culture  Setup Time   Final    GRAM NEGATIVE RODS IN BOTH AEROBIC AND ANAEROBIC BOTTLES Gram Stain Report Called to,Read Back By and Verified With: HENLEY,J RN @ [facility] ON 12.15.19 BY BOWMAN,L CRITICAL RESULT CALLED TO, READ BACK BY AND VERIFIED WITH: PHARMD Lavell Luster [id number] MLM    Culture (A)  Final    ESCHERICHIA COLI SUSCEPTIBILITIES TO FOLLOW Performed at [facility], [place] [address] [place], [place] [address]    Report Status PENDING  Incomplete  Blood Culture ID Panel (Reflexed)     Status: Abnormal   Collection Time: 04/22/17  9:00 AM  Result Value Ref Range Status   Enterococcus species NOT DETECTED NOT DETECTED Final   Listeria monocytogenes NOT DETECTED NOT DETECTED Final   Staphylococcus species NOT DETECTED NOT DETECTED Final   Staphylococcus aureus NOT DETECTED NOT DETECTED Final   Streptococcus species NOT DETECTED NOT DETECTED Final   Streptococcus agalactiae NOT DETECTED NOT DETECTED Final   Streptococcus pneumoniae NOT DETECTED NOT DETECTED Final   Streptococcus pyogenes NOT DETECTED NOT DETECTED Final   Acinetobacter baumannii NOT DETECTED NOT DETECTED Final   Enterobacteriaceae species DETECTED (A) NOT DETECTED Final    Comment: Enterobacteriaceae represent a large family of gram-negative bacteria, not a  single organism. CRITICAL RESULT CALLED TO, READ BACK BY AND VERIFIED WITH: PHARMD J GRIMSLEY [id number] MLM    Enterobacter cloacae complex NOT DETECTED NOT DETECTED Final   Escherichia coli DETECTED (A) NOT DETECTED Final    Comment: CRITICAL RESULT CALLED TO, READ BACK BY AND VERIFIED WITH: PHARMD Lavell Luster [id number] MLM  Klebsiella oxytoca NOT DETECTED NOT DETECTED Final   Klebsiella pneumoniae NOT DETECTED NOT DETECTED Final   Proteus species NOT DETECTED NOT DETECTED Final   Serratia marcescens NOT DETECTED NOT DETECTED Final   Carbapenem resistance NOT DETECTED NOT DETECTED Final   Haemophilus influenzae NOT DETECTED NOT DETECTED Final   Neisseria meningitidis NOT DETECTED NOT DETECTED Final   Pseudomonas aeruginosa NOT DETECTED NOT DETECTED Final   Candida albicans NOT DETECTED NOT DETECTED Final   Candida glabrata NOT DETECTED NOT DETECTED Final   Candida krusei NOT DETECTED NOT DETECTED Final   Candida parapsilosis NOT DETECTED NOT DETECTED Final   Candida tropicalis NOT DETECTED NOT DETECTED Final    Comment: Performed at [facility], [place] [address], [place], [place] [address]         Radiology Studies: Ct Head Wo Contrast  Result Date: 04/23/2017 CLINICAL DATA:  Altered level consciousness EXAM: CT HEAD WITHOUT CONTRAST TECHNIQUE: Contiguous axial images were obtained from the base of the skull through the vertex without intravenous contrast. COMPARISON:  None. FINDINGS: Brain: No evidence of acute infarction, hemorrhage, extra-axial collection, ventriculomegaly, or mass effect. Old, large right MCA territory infarct with encephalomalacia. Generalized cerebral atrophy. Periventricular white matter low attenuation likely secondary to microangiopathy. Vascular: Cerebrovascular atherosclerotic calcifications are noted. 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 Skull: Negative for fracture or focal lesion. Sinuses/Orbits: Visualized portions of the  orbits are unremarkable. Visualized portions of the paranasal sinuses and mastoid air cells are unremarkable. Other: None. IMPRESSION: 1. No acute intracranial pathology. 2. Old right MCA territory infarct with encephalomalacia. Electronically Signed   By: Kathreen Devoid   On: 04/23/2017 13:16   Dg C-arm 1-60 Min-no Report  Result Date: 04/22/2017 Fluoroscopy was utilized by the requesting physician.  No radiographic interpretation.    Scheduled Meds: . divalproex  125 mg Oral BID  . divalproex  500 mg Oral BID  . levETIRAcetam  1,000 mg Oral Daily  . levETIRAcetam  1,500 mg Oral QHS  . levothyroxine  100 mcg Oral QAC breakfast  . nystatin  1 g Topical BID  . nystatin cream  1 application Topical BID  . olopatadine  1 drop Both Eyes BID  . pantoprazole (PROTONIX) IV  40 mg Intravenous Q24H  . polyethylene glycol  17 g Oral Daily   Continuous Infusions: . dextrose 125 mL/hr at 04/24/17 1258  . meropenem (MERREM) IV 1 g (04/24/17 1258)     LOS: 3 days     Roxan Hockey, MD [facility] Pager [phone]  If 7PM-7AM, please contact night-coverage [email or web address] Password [facility] 04/24/2017, 4:13 PM

## 2017-04-24 NOTE — Consult Note (Deleted)
Urology Progress Note   2 Days Post-Op  Subjective: 62yo F w/ hx stroke, obesity who presented to ED at [facility] 2/14 w/ slurred speech, concern for CVA. Significant AKI was noted, and patient was noted to have a right obstructing stone. UA concerning for infection, urine culture pending. Blood cultures pending. She was transferred to [facility] on 2/15, and underwent urgent right ureteral stent placement in AM.   Over the last 24 hours she has significantly improved. Last true fever around time of surgery, has had borderline fevers ince. BP borderline hypotensive, has been tachycardic but also improved. Good UOP. Leukocytosis and AKI have significantly improved, present but better.   Patient states foley catheter significantly bothering her. When she ambulates she has significant discomfort over suprapubic area, likely from bladder spasms. This has been limiting her mobility and she is emotional this morning about being in the hospital. Feels overall better otherwise. Denies n/v/c.   Objective: Vital signs in last 24 hours: Temp:  [98.4 F (36.9 C)-101.2 F (38.4 C)] 99 F (37.2 C) (02/16 0810) Pulse Rate:  [92-121] 102 (02/16 0856) Resp:  [9-39] 18 (02/16 0856) BP: (78-138)/(45-78) 108/60 (02/16 0800) SpO2:  [87 %-98 %] 98 % (02/16 0856) Weight:  [123.1 kg (271 lb 6.2 oz)] 123.1 kg (271 lb 6.2 oz) (02/16 0400)  Intake/Output from previous day: 02/15 0701 - 02/16 0700 In: 2579.2 [I.V.:1004.2; IV Piggyback:1100] Out: 900 [Urine:900] Intake/Output this shift: No intake/output data recorded.  Physical Exam:  General: Alert and oriented CV: RRR Lungs: Clear Abdomen: Soft, non distended  GU: Foley with clear yellow urine in tubing  Ext: NT, No erythema  Lab Results: Recent Labs    04/22/17 2308 04/23/17 0802 04/24/17 0306  HGB 12.4 11.7* 11.5*  HCT 39.2 37.1 37.1   BMET Recent Labs    04/23/17 0802 04/24/17 0306  NA 149* 150*  K 4.0 4.0  CL 115* 120*  CO2 23 20*  GLUCOSE  86 75  BUN 52* 40*  CREATININE 2.57* 1.57*  CALCIUM 7.1* 6.9*     Blood culture  Ref Range & Units 2d ago  Enterococcus species NOT DETECTED NOT DETECTED   Listeria monocytogenes NOT DETECTED NOT DETECTED   Staphylococcus species NOT DETECTED NOT DETECTED   Staphylococcus aureus NOT DETECTED NOT DETECTED   Streptococcus species NOT DETECTED NOT DETECTED   Streptococcus agalactiae NOT DETECTED NOT DETECTED   Streptococcus pneumoniae NOT DETECTED NOT DETECTED   Streptococcus pyogenes NOT DETECTED NOT DETECTED   Acinetobacter baumannii NOT DETECTED NOT DETECTED   Enterobacteriaceae species NOT DETECTED DETECTED Abnormal    Comment: Enterobacteriaceae represent a large family of gram-negative bacteria, not a single organism.  CRITICAL RESULT CALLED TO, READ BACK BY AND VERIFIED WITH:  PHARMD J GRIMSLEY [phone] MLM   Enterobacter cloacae complex NOT DETECTED NOT DETECTED   Escherichia coli NOT DETECTED DETECTED Abnormal    Comment: CRITICAL RESULT CALLED TO, READ BACK BY AND VERIFIED WITH:  PHARMD J GRIMSLEY [phone] MLM   Klebsiella oxytoca NOT DETECTED NOT DETECTED   Klebsiella pneumoniae NOT DETECTED NOT DETECTED   Proteus species NOT DETECTED NOT DETECTED   Serratia marcescens NOT DETECTED NOT DETECTED   Carbapenem resistance NOT DETECTED NOT DETECTED   Haemophilus influenzae NOT DETECTED NOT DETECTED   Neisseria meningitidis NOT DETECTED NOT DETECTED   Pseudomonas aeruginosa NOT DETECTED NOT DETECTED   Candida albicans NOT DETECTED NOT DETECTED   Candida glabrata NOT DETECTED NOT DETECTED   Candida krusei NOT DETECTED  NOT DETECTED   Candida parapsilosis NOT DETECTED NOT DETECTED   Candida tropicalis NOT DETECTED NOT DETECTED   Comment: Performed at [facility], [place] [address] [place], [place] [address]     Studies/Results: Ct Abdomen Pelvis Wo Contrast  Result Date: 04/22/2017 CLINICAL DATA:  Recurrent urinary tract infection. Diabetes. Hydronephrosis seen  on recent ultrasound. EXAM: CT ABDOMEN AND PELVIS WITHOUT CONTRAST TECHNIQUE: Multidetector CT imaging of the abdomen and pelvis was performed following the standard protocol without IV contrast. COMPARISON:  04/22/2017 FINDINGS: Despite efforts by the technologist and patient, motion artifact is present on today's exam and could not be eliminated. This reduces exam sensitivity and specificity. Lower chest: Mild cardiomegaly with small bilateral pleural effusions and passive atelectasis in the lung bases. Hepatobiliary: Dependent density in the gallbladder could be sludge or gallstones. Otherwise unremarkable. Pancreas: Unremarkable Spleen: Unremarkable Adrenals/Urinary Tract: The adrenal glands appear normal. There is abnormal bilateral delayed nephrogram with contrast medium in both collecting systems, ureters, and the mostly empty urinary bladder (Foley catheter in place). There is right hydronephrosis along with asymmetric abnormal stranding around the right renal collecting system. Patchy variable contrast in the left kidney noted. With the use of bone window in, the contrast in the renal collecting systems can be somewhat differentiated from the calculi. There is a central left renal collecting system calculus measuring 1.3 by 0.9 cm on image 40/2. Multiple nonobstructive left renal collecting system calculi are shown for example on image 33/2 using bone windows. Multiple clustered calcifications in the left kidney lower pole are also present. Particularly on the left side. Stomach/Bowel: There is an unusual appearance of the rectum, with marked wall thickening in the lower rectum circumferentially, and a patulous anus. Vascular/Lymphatic: Scattered small retroperitoneal lymph nodes are present. These may be reactive. Aortoiliac atherosclerotic vascular disease. Reproductive: Multiple uterine fibroids, subserosal and intramural, similar contour to the prior exam. Other: No supplemental non-categorized  findings. Musculoskeletal: High density along the margins of the inferior thecal sac on image 64/6, possibly calcification or contrast, less likely blood products. An intrathecal pump is present. IMPRESSION: 1. Mild right hydronephrosis related to a 1.3 by 0.9 cm right collecting system stone which is likely mildly obstructing the right UPJ. 2. Abnormal delayed nephrograms compatible with acute renal injury. This contrast was administered yesterday and is still in the kidneys. There is also some excreted contrast in the collecting systems and ureters. 3. Greater degree of heterogeneity in the left kidney than the right with regard to the contrast distribution and appearance, along with left perirenal stranding somewhat greater than the right. I cannot exclude pyelonephritis given this appearance, but the appearance is not considered specific given the difference in degree of obstruction and the overall unusual situation of renal contrast 1 day after administration. 4. Mild cardiomegaly with bilateral pleural effusions. 5. Abnormal appearance of the rectum with considerable distal rectal wall thickening and patulous anus. 6. There is high density in the thecal sac inferiorly, probably calcification. 7. Other imaging findings of potential clinical significance: Uterine fibroids. Possible gallstones. Aortic Atherosclerosis (ICD10-I70.0). Electronically Signed   By: Van Clines M.D.   On: 04/22/2017 17:06   Ct Head Wo Contrast  Result Date: 04/23/2017 CLINICAL DATA:  Altered level consciousness EXAM: CT HEAD WITHOUT CONTRAST TECHNIQUE: Contiguous axial images were obtained from the base of the skull through the vertex without intravenous contrast. COMPARISON:  None. FINDINGS: Brain: No evidence of acute infarction, hemorrhage, extra-axial collection, ventriculomegaly, or mass effect. Old, large right  MCA territory infarct with encephalomalacia. Generalized cerebral atrophy. Periventricular white matter low  attenuation likely secondary to microangiopathy. Vascular: Cerebrovascular atherosclerotic calcifications are noted. 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 Skull: Negative for fracture or focal lesion. Sinuses/Orbits: Visualized portions of the orbits are unremarkable. Visualized portions of the paranasal sinuses and mastoid air cells are unremarkable. Other: None. IMPRESSION: 1. No acute intracranial pathology. 2. Old right MCA territory infarct with encephalomalacia. Electronically Signed   By: Kathreen Devoid   On: 04/23/2017 13:16   Dg C-arm 1-60 Min-no Report  Result Date: 04/22/2017 Fluoroscopy was utilized by the requesting physician.  No radiographic interpretation.    Assessment/Plan:  #Nephrolithiasis: obstructing right renal pelvic stone s/p right ureteral stent placement 2/15 AM. No additional treatment during admission, will plan for surgery for stone removal  in 2-3 weeks after infection has cleared - patient will follow up in office with [place] ~2 weeks after discharge   #Urosepsis:  E coli bacteremia, likely source from urine. Urine culture pending, blood culture sensitivities pending. On meropenem, VSS improving - discontinue foley catheter, TOV - oxybutynin PRN for bladder spasms if stent continue to causes discomfort with foley catheter out   Urology will continue to follow peripherally, please page with any acute questions or concerns.    LOS: 3 days   Kathrine Rieves L 04/24/2017, 9:32 AM

## 2017-04-25 ENCOUNTER — Encounter (HOSPITAL_COMMUNITY): Payer: Self-pay

## 2017-04-25 LAB — CULTURE, BLOOD (ROUTINE X 2): Special Requests: ADEQUATE

## 2017-04-25 LAB — URINE CULTURE: Culture: >=100000 — AB

## 2017-04-25 LAB — CBC
HCT: 38.7 % (ref 36.0–46.0)
HEMOGLOBIN: 12.2 g/dL (ref 12.0–15.0)
MCH: 29.6 pg (ref 26.0–34.0)
MCHC: 31.5 g/dL (ref 30.0–36.0)
MCV: 93.9 fL (ref 78.0–100.0)
PLATELETS: 50 10*3/uL — AB (ref 150–400)
RBC: 4.12 MIL/uL (ref 3.87–5.11)
RDW: 16.5 % — ABNORMAL HIGH (ref 11.5–15.5)
WBC: 12.8 10*3/uL — AB (ref 4.0–10.5)

## 2017-04-25 LAB — BASIC METABOLIC PANEL
Anion gap: 7 (ref 5–15)
BUN: 28 mg/dL — ABNORMAL HIGH (ref 6–20)
CHLORIDE: 115 mmol/L — AB (ref 101–111)
CO2: 24 mmol/L (ref 22–32)
Calcium: 7.1 mg/dL — ABNORMAL LOW (ref 8.9–10.3)
Creatinine, Ser: 1.25 mg/dL — ABNORMAL HIGH (ref 0.44–1.00)
GFR, EST AFRICAN AMERICAN: 53 mL/min — AB (ref >60)
GFR, EST NON AFRICAN AMERICAN: 45 mL/min — AB (ref >60)
Glucose, Bld: 147 mg/dL — ABNORMAL HIGH (ref 65–99)
POTASSIUM: 3.6 mmol/L (ref 3.5–5.1)
Sodium: 146 mmol/L — ABNORMAL HIGH (ref 135–145)

## 2017-04-25 LAB — PROCALCITONIN: PROCALCITONIN: 10.29 ng/mL

## 2017-04-25 LAB — MRSA PCR SCREENING: MRSA BY PCR: NEGATIVE

## 2017-04-25 MED ORDER — METOPROLOL TARTRATE 5 MG/5ML IV SOLN
INTRAVENOUS | Status: AC
  Administered 2017-04-25: 5 mg via INTRAVENOUS
  Filled 2017-04-25: qty 5

## 2017-04-25 MED ORDER — AMLODIPINE BESYLATE 10 MG PO TABS
10.0000 mg | ORAL_TABLET | Freq: Every day | ORAL | Status: DC
  Administered 2017-04-25 – 2017-04-28 (×4): 10 mg via ORAL
  Filled 2017-04-25 (×4): qty 1

## 2017-04-25 MED ORDER — LABETALOL HCL 5 MG/ML IV SOLN
10.0000 mg | INTRAVENOUS | Status: DC | PRN
Start: 2017-04-25 — End: 2017-04-26
  Administered 2017-04-25: 10 mg via INTRAVENOUS
  Filled 2017-04-25 (×2): qty 4

## 2017-04-25 MED ORDER — METOPROLOL TARTRATE 5 MG/5ML IV SOLN
5.0000 mg | INTRAVENOUS | Status: DC | PRN
  Administered 2017-04-25: 5 mg via INTRAVENOUS

## 2017-04-25 MED ORDER — SODIUM CHLORIDE 0.9 % IV SOLN
1.0000 g | Freq: Three times a day (TID) | INTRAVENOUS | Status: DC
  Administered 2017-04-25 – 2017-04-28 (×9): 1 g via INTRAVENOUS
  Filled 2017-04-25 (×9): qty 1

## 2017-04-25 MED ORDER — HYDRALAZINE HCL 20 MG/ML IJ SOLN
10.0000 mg | Freq: Four times a day (QID) | INTRAMUSCULAR | Status: DC | PRN
  Administered 2017-04-25: 10 mg via INTRAVENOUS
  Filled 2017-04-25: qty 1

## 2017-04-25 MED ORDER — HYDRALAZINE HCL 20 MG/ML IJ SOLN
10.0000 mg | INTRAMUSCULAR | Status: DC | PRN
  Administered 2017-04-27 – 2017-04-28 (×2): 10 mg via INTRAVENOUS
  Filled 2017-04-25 (×2): qty 1

## 2017-04-25 MED ORDER — PANTOPRAZOLE SODIUM 40 MG PO TBEC
40.0000 mg | DELAYED_RELEASE_TABLET | Freq: Every day | ORAL | Status: DC
  Administered 2017-04-25 – 2017-04-27 (×3): 40 mg via ORAL
  Filled 2017-04-25 (×3): qty 1

## 2017-04-25 NOTE — Progress Notes (Signed)
Pharmacy Antibiotic Note  [name] is a 62 y.o. female admitted on 04/21/2017 with bacteremia.  Pharmacy has been consulted for Meropenem dosing. Tx from [facility] for cystoscopy with R ureteral stent. Hx ESBL in urine. AKI, improving.   PMH: CVA, kidney stones, depression, DM, HTN, Sz, thrombocytopenia, hypothyroid   Plan: - SCr improved and ESBL organism so increase Meropenem from 1g q12h to 1g q8h. - f/u clinical course, labs, and cultures.  Height: 5' 1.81" (157 cm) Weight: 271 lb 6.2 oz (123.1 kg) IBW/kg (Calculated) : 49.67  Temp (24hrs), Avg:99.3 F (37.4 C), Min:98.6 F (37 C), Max:99.8 F (37.7 C)  Recent Labs  Lab 04/22/17 0449 04/22/17 0900 04/22/17 2308 04/23/17 0254 04/23/17 0802 04/24/17 0306 04/25/17 0322  WBC 19.0*  --  21.4*  --  20.2* 19.5* 12.8*  CREATININE 2.69*  --  2.55*  --  2.57* 1.57* 1.25*  LATICACIDVEN  --  2.0* 1.9 1.6  --   --   --     Estimated Creatinine Clearance: 59 mL/min (A) (by C-G formula based on SCr of 1.25 mg/dL (H)).    Allergies  Allergen Reactions  . Codeine Other (See Comments)    unknown  . Lacosamide Other (See Comments)    Mood changes/agitation No rx noted    . Morphine Other (See Comments)    No rx noted    . Zolpidem    Antimicrobials this admission:  2/14 Zosyn x 2 doses 2/15 Meropenem >>  Dose adjustments this admission:  2/17: SCr improved and ESBL so increase Meropenem from 1g q12h to 2g q8h.  Microbiology results:  2/14 UCx: negative but reincubated 2/14 BCx: 2 of 2 GNR (BCID = ESBL E.coli sens to gent, imip, Zosyn)  Thank you for allowing pharmacy to be a part of this patient's care.  Romeo Rabon, PharmD. Mobile: [phone]. 04/25/2017,10:53 AM.

## 2017-04-25 NOTE — Progress Notes (Signed)
PROGRESS NOTE    [name]  ZHG:[id number] DOB: Sep 13, 1955 DOA: 04/21/2017 PCP: Hilbert Corrigan, MD   Assessment & Plan:   Principal Problem:   Severe sepsis with septic shock [facility]) Active Problems:   Morbid obesity (HCC)   History of stroke   Thrombocytopenia, unspecified ([place])   Central sleep apnea   Hemiparesis affecting left side as late effect of cerebrovascular accident [facility])   Hypothyroidism   Seizure disorder ([place])   Lower urinary tract infectious disease   HTN (hypertension)   AKI (acute kidney injury) ([place])   CVA (cerebral vascular accident) ([place])  Brief Narrative: Patient is a 62 year old female with past medical history of stroke, severe left-sided hemiparesis as well as neglect, seizure disorder, sleep apnea, hypertension, hypothyroidism, morbid obesity, chronic nephrolithiasis who resides at [facility] (bedbound) was transferred to [facility] from [facility] on 04/22/17 (admitted to [facility] on 04/21/17)  with concerns about altered mental status and sepsis. She was found to have an obstructing right calculus , on 04/22/17 underwent cystoscopy with right ureteral stent placement for urosepsis.  Urine culture with ESBL E. coli, blood cultures also with E. coli   Plan:- 1)ESBL E Coli Severe sepsis with septic shock: Clinically improving, white count down to 12.9 from 21.4,  afebrile,  hypotension is resolved, tachycardia is mostly resolved,  pro-calcitonin is down to 10.2 from 48.6,  c/n  meropenem for E. coli sepsis and bacteremia from urinary source  2)Altered mental status: Multifactorial, suspect due to toxic metabolic encephalopathy mostly due to combination of severe sepsis and uremia from renal dysfunction , CT head without acute findings , patient is more awake, passed a swallow eval at bedside on 04/24/2017   3)Acute kidney injury: Mostly resolved, creatinine is down to 1.25 from 2.69,  most likely due to hypotension in the setting of septic shock, compounded  by recent Contrast study,  urine output is better, c/n  IV fluids to D5w due to hypernatremia and hyperchloremia, discussed with Dr. Justin Mend from nephrology service  4)Nephrolithiasis/pyelonephritis: Most likely the source of sepsis.  on 04/22/17 underwent cystoscopy with right ureteral stent placement for urosepsis.   Plan is to remove the stent and stone  as an outpatient . CT imaging showed:Right hydronephrosis, renal pelvic stone, bilateral abnormal delayed nephrograms compatible with acute renal injury, heterogeneity of the left kidney parenchyma possibly consistent with pyelonephritis  5)H/o Stroke: Has history of prior right-sided MCA stroke with dense left hemiparesis and neglect.  She is bedbound.  Patient also has residual facial droop.  Clinical exam and repeat CTA without new acute findings   6)Seizure disorder:   EEG did not show any seizure activity, no evidence of new seizures, continue Keppra and Depakote  7)Thrombocytopenia:Chronic.  Most likely exacerbated by sepsis now .  We will continue to monitor the counts.  8)Hypothyroidism: Continue home Synthyroid.  9)Sleep apnea: Continue CPAP/BiPAP at night.    DVT prophylaxis: SCD Code Status: Full Disposition Plan: Unknown at this time   Consultants: PCCM, nephrology, urology  Procedures: Ureteral Stent placement on the right side on 04/22/17  Antimicrobials: Meropenem since 04/23/17  Subjective: Less lethargic, woke up well after BiPAP overnight, she started back at bedside, questions answered urine output is better  Objective: Vitals:   04/25/17 1100 04/25/17 1200 04/25/17 1227 04/25/17 1338  BP: (!) 163/82 (!) 187/98 (!) 187/98 (!) 175/78  Pulse: (!) 103 (!) 103  (!) 108  Resp: (!) 27 (!) 29  (!) 24  Temp:  TempSrc:      SpO2: 96% 95%  96%  Weight:      Height:        Intake/Output Summary (Last 24 hours) at 04/25/2017 1352 Last data filed at 04/25/2017 0600 Gross per 24 hour  Intake 2549.17 ml  Output 650  ml  Net 1899.17 ml   Filed Weights   04/21/17 1136 04/24/17 0400  Weight: 97.5 kg (215 lb) 123.1 kg (271 lb 6.2 oz)    Examination:  General exam: More awake , morbidly obese HEENT:PERRL,Oral mucosa moist, Ear/Nose normal on gross exam Respiratory system: Decreased in bases,  Cardiovascular system: RRR. No JVD, Gastrointestinal system: Abdomen is nondistended, soft and nontender. No organomegaly or masses felt.  Bowel sounds present, Central nervous system:  facial droop , left-sided hemiparesis unchanged  extremities: No edema,  distal peripheral pulses palpable. Skin: No rashes, lesions or ulcers,no icterus ,no pallor GU-Foley with clear urine   CBC: Recent Labs  Lab 04/21/17 1107  04/22/17 0449 04/22/17 2308 04/23/17 0802 04/24/17 0306 04/25/17 0322  WBC 12.6*  --  19.0* 21.4* 20.2* 19.5* 12.8*  NEUTROABS 10.2  --   --  16.8 17.4* 14.1*  --   HGB 15.3*   < > 13.3 12.4 11.7* 11.5* 12.2  HCT 49.8*   < > 43.3 39.2 37.1 37.1 38.7  MCV 96.5  --  95.2 93.8 94.6 95.9 93.9  PLT 58*  --  50* 43* 38* 39* 50*   < > = values in this interval not displayed.   Basic Metabolic Panel: Recent Labs  Lab 04/22/17 0449 04/22/17 2308 04/23/17 0802 04/24/17 0306 04/25/17 0322  NA 146* 149* 149* 150* 146*  K 4.4 4.1 4.0 4.0 3.6  CL 108 112* 115* 120* 115*  CO2 24 22 23  20* 24  GLUCOSE 69 91 86 75 147*  BUN 39* 48* 52* 40* 28*  CREATININE 2.69* 2.55* 2.57* 1.57* 1.25*  CALCIUM 8.4* 7.6* 7.1* 6.9* 7.1*   GFR: Estimated Creatinine Clearance: 59 mL/min (A) (by C-G formula based on SCr of 1.25 mg/dL (H)). Liver Function Tests: Recent Labs  Lab 04/21/17 1107 04/23/17 0802  AST 42* 42*  ALT 12* 15  ALKPHOS 66 58  BILITOT 0.9 0.6  PROT 7.3 5.8*  ALBUMIN 3.3* 2.5*   No results for input(s): LIPASE, AMYLASE in the last 168 hours. Recent Labs  Lab 04/23/17 1342 04/24/17 0854  AMMONIA 15 27   Coagulation Profile: Recent Labs  Lab 04/21/17 1107  INR 1.22   Cardiac  Enzymes: Recent Labs  Lab 04/23/17 1607 04/23/17 2032 04/24/17 0306  TROPONINI 0.07* 0.08* 0.09*   BNP (last 3 results) No results for input(s): PROBNP in the last 8760 hours. HbA1C: No results for input(s): HGBA1C in the last 72 hours. CBG: Recent Labs  Lab 04/21/17 1054 04/22/17 1912 04/22/17 2147  GLUCAP 85 111* 98   Lipid Profile: No results for input(s): CHOL, HDL, LDLCALC, TRIG, CHOLHDL, LDLDIRECT in the last 72 hours. Thyroid Function Tests: No results for input(s): TSH, T4TOTAL, FREET4, T3FREE, THYROIDAB in the last 72 hours. Anemia Panel: No results for input(s): VITAMINB12, FOLATE, FERRITIN, TIBC, IRON, RETICCTPCT in the last 72 hours. Sepsis Labs: Recent Labs  Lab 04/22/17 0900 04/22/17 2308 04/23/17 0254 04/23/17 1342 04/24/17 0306 04/25/17 0322  PROCALCITON  --   --   --  48.65 26.47 10.29  LATICACIDVEN 2.0* 1.9 1.6  --   --   --     Recent Results (from the past 240  hour(s))  Culture, Urine     Status: Abnormal   Collection Time: 04/22/17  3:10 AM  Result Value Ref Range Status   Specimen Description   Final    URINE, CATHETERIZED Performed at [facility], [address], [place], [place] [address]    Special Requests   Final    NONE Performed at [facility], [address] [place], [place] [address]    Culture (A)  Final    >=100,000 COLONIES/mL ESCHERICHIA COLI Confirmed Extended Spectrum Beta-Lactamase Producer (ESBL).  In bloodstream infections from ESBL organisms, carbapenems are preferred over piperacillin/tazobactam. They are shown to have a lower risk of mortality. Performed at [facility], [place] [address], [place], [place] [address]    Report Status 04/25/2017 FINAL  Final   Organism ID, Bacteria ESCHERICHIA COLI (A)  Final      Susceptibility   Escherichia coli - MIC*    AMPICILLIN >=32 RESISTANT Resistant     CEFAZOLIN >=64 RESISTANT Resistant     CEFTRIAXONE >=64 RESISTANT Resistant     CIPROFLOXACIN >=4 RESISTANT  Resistant     GENTAMICIN <=1 SENSITIVE Sensitive     IMIPENEM <=0.25 SENSITIVE Sensitive     NITROFURANTOIN <=16 SENSITIVE Sensitive     TRIMETH/SULFA >=320 RESISTANT Resistant     AMPICILLIN/SULBACTAM >=32 RESISTANT Resistant     PIP/TAZO 16 SENSITIVE Sensitive     Extended ESBL POSITIVE Resistant     * >=100,000 COLONIES/mL ESCHERICHIA COLI  Culture, blood (routine x 2)     Status: Abnormal (Preliminary result)   Collection Time: 04/22/17  8:02 AM  Result Value Ref Range Status   Specimen Description   Final    BLOOD RIGHT HAND Performed at [facility], [address], [place], [place] [address]    Special Requests   Final    BOTTLES DRAWN AEROBIC ONLY Blood Culture results may not be optimal due to an inadequate volume of blood received in culture bottles Performed at [facility], [address], [place], [place] [address]    Culture  Setup Time   Final    GRAM NEGATIVE RODS AEB ONLY Gram Stain Report Called to,Read Back By and Verified With: HENLEY,J @ 0600 ON 2.15.19 BY BOWMAN,L Performed at [facility], [address], [place], [place] [address]    Culture (A)  Final    ESCHERICHIA COLI SUSCEPTIBILITIES TO FOLLOW Performed at [facility], [place] [address] [place], [place] [address]    Report Status PENDING  Incomplete  Culture, blood (routine x 2)     Status: Abnormal   Collection Time: 04/22/17  9:00 AM  Result Value Ref Range Status   Specimen Description   Final    BLOOD LEFT HAND Performed at [facility], [address], [place], [place] [address]    Special Requests   Final    BOTTLES DRAWN AEROBIC AND ANAEROBIC Blood Culture adequate volume Performed at [facility], [address] [place], [place] [address]    Culture  Setup Time   Final    GRAM NEGATIVE RODS IN BOTH AEROBIC AND ANAEROBIC BOTTLES Gram Stain Report Called to,Read Back By and Verified With: HENLEY,J RN @ [facility] ON 12.15.19 BY BOWMAN,L CRITICAL RESULT CALLED TO, READ BACK BY AND VERIFIED WITH:  PHARMD J GRIMSLEY [id number] MLM    Culture (A)  Final    ESCHERICHIA COLI Confirmed Extended Spectrum Beta-Lactamase Producer (ESBL).  In bloodstream infections from ESBL organisms, carbapenems are preferred over piperacillin/tazobactam. They  are shown to have a lower risk of mortality. Performed at [facility], [place] [address], [place], [place] [address]    Report Status 04/25/2017 FINAL  Final   Organism ID, Bacteria ESCHERICHIA COLI  Final      Susceptibility   Escherichia coli - MIC*    AMPICILLIN >=32 RESISTANT Resistant     CEFAZOLIN >=64 RESISTANT Resistant     CEFEPIME 16 INTERMEDIATE Intermediate     CEFTAZIDIME >=64 RESISTANT Resistant     CEFTRIAXONE >=64 RESISTANT Resistant     CIPROFLOXACIN >=4 RESISTANT Resistant     GENTAMICIN <=1 SENSITIVE Sensitive     IMIPENEM <=0.25 SENSITIVE Sensitive     TRIMETH/SULFA >=320 RESISTANT Resistant     AMPICILLIN/SULBACTAM >=32 RESISTANT Resistant     PIP/TAZO 8 SENSITIVE Sensitive     Extended ESBL POSITIVE Resistant     * ESCHERICHIA COLI  Blood Culture ID Panel (Reflexed)     Status: Abnormal   Collection Time: 04/22/17  9:00 AM  Result Value Ref Range Status   Enterococcus species NOT DETECTED NOT DETECTED Final   Listeria monocytogenes NOT DETECTED NOT DETECTED Final   Staphylococcus species NOT DETECTED NOT DETECTED Final   Staphylococcus aureus NOT DETECTED NOT DETECTED Final   Streptococcus species NOT DETECTED NOT DETECTED Final   Streptococcus agalactiae NOT DETECTED NOT DETECTED Final   Streptococcus pneumoniae NOT DETECTED NOT DETECTED Final   Streptococcus pyogenes NOT DETECTED NOT DETECTED Final   Acinetobacter baumannii NOT DETECTED NOT DETECTED Final   Enterobacteriaceae species DETECTED (A) NOT DETECTED Final    Comment: Enterobacteriaceae represent a large family of gram-negative bacteria, not a single organism. CRITICAL RESULT CALLED TO, READ BACK BY AND VERIFIED WITH: PHARMD J GRIMSLEY [id number]  MLM    Enterobacter cloacae complex NOT DETECTED NOT DETECTED Final   Escherichia coli DETECTED (A) NOT DETECTED Final    Comment: CRITICAL RESULT CALLED TO, READ BACK BY AND VERIFIED WITH: PHARMD J GRIMSLEY [id number] MLM    Klebsiella oxytoca NOT DETECTED NOT DETECTED Final   Klebsiella pneumoniae NOT DETECTED NOT DETECTED Final   Proteus species NOT DETECTED NOT DETECTED Final   Serratia marcescens NOT DETECTED NOT DETECTED Final   Carbapenem resistance NOT DETECTED NOT DETECTED Final   Haemophilus influenzae NOT DETECTED NOT DETECTED Final   Neisseria meningitidis NOT DETECTED NOT DETECTED Final   Pseudomonas aeruginosa NOT DETECTED NOT DETECTED Final   Candida albicans NOT DETECTED NOT DETECTED Final   Candida glabrata NOT DETECTED NOT DETECTED Final   Candida krusei NOT DETECTED NOT DETECTED Final   Candida parapsilosis NOT DETECTED NOT DETECTED Final   Candida tropicalis NOT DETECTED NOT DETECTED Final    Comment: Performed at [facility], [place]. [address] [place], [place] [address]         Radiology Studies: No results found.  Scheduled Meds: . amLODipine  10 mg Oral Daily  . divalproex  125 mg Oral BID  . divalproex  500 mg Oral BID  . levETIRAcetam  1,000 mg Oral Daily  . levETIRAcetam  1,500 mg Oral QHS  . levothyroxine  100 mcg Oral QAC breakfast  . nystatin  1 g Topical BID  . nystatin cream  1 application Topical BID  . olopatadine  1 drop Both Eyes BID  . pantoprazole  40 mg Oral QAC supper  . polyethylene glycol  17 g Oral Daily   Continuous Infusions: . dextrose 125 mL/hr at 04/25/17 0442  . meropenem (  MERREM) IV       LOS: 4 days     Roxan Hockey, MD [facility] Pager [phone]  If 7PM-7AM, please contact night-coverage [email or web address] Password [email or web address] 04/25/2017, 1:52 PM

## 2017-04-25 NOTE — Progress Notes (Signed)
Pt noted to have increased BP of 191/101. New orders received and noted. RN will continue to monitor.

## 2017-04-25 NOTE — Progress Notes (Signed)
The patient is receiving Protonix by the intravenous route.  Based on criteria approved by the Pharmacy and [facility], the medication is being converted to the equivalent oral dose form.  These criteria include: -No active GI bleeding -Able to tolerate diet of full liquids (or better) or tube feeding -Able to tolerate other medications by the oral or enteral route  If you have any questions about this conversion, please contact the Pharmacy Department (phone [phone]  Thank you.  Romeo Rabon, PharmD. Mobile: [phone]. 04/25/2017,10:46 AM.

## 2017-04-25 NOTE — Progress Notes (Signed)
[facility] ROUNDING NOTE   Subjective:   : 62 year old female transferred here from [facility] for cystoscopy with right ureteral stent placement for urosepsis. She was found to have an obstructing right calculus  She was hypotensive in the PACU She continues on zosyn IV antibiotics   Creatinine increased to 2.04 on admission     Appears to be doing better  Creatinine now decreased to 1.26  Urinalysis was cloudy with some proteinuria   Urine output improved  Hypotension resolved with hydration  Objective:  Vital signs in last 24 hours:  Temp:  [98.6 F (37 C)-99.8 F (37.7 C)] 99.8 F (37.7 C) (02/17 0706) Pulse Rate:  [89-110] 89 (02/17 0900) Resp:  [13-28] 19 (02/17 0900) BP: (131-191)/(66-108) 143/76 (02/17 0900) SpO2:  [91 %-98 %] 96 % (02/17 0900)  Weight change:  Filed Weights   04/21/17 1136 04/24/17 0400  Weight: 215 lb (97.5 kg) 271 lb 6.2 oz (123.1 kg)    Intake/Output: I/O last 3 completed shifts: In: 2549.2 [P.O.:120; I.V.:2129.2; IV Piggyback:300] Out: 1550 [Urine:1550]   Intake/Output this shift:  No intake/output data recorded.  CVS- RRR RS- CTA ABD- BS present soft non-distended EXT- no edema   Basic Metabolic Panel: Recent Labs  Lab 04/22/17 0449 04/22/17 2308 04/23/17 0802 04/24/17 0306 04/25/17 0322  NA 146* 149* 149* 150* 146*  K 4.4 4.1 4.0 4.0 3.6  CL 108 112* 115* 120* 115*  CO2 24 22 23  20* 24  GLUCOSE 69 91 86 75 147*  BUN 39* 48* 52* 40* 28*  CREATININE 2.69* 2.55* 2.57* 1.57* 1.25*  CALCIUM 8.4* 7.6* 7.1* 6.9* 7.1*    Liver Function Tests: Recent Labs  Lab 04/21/17 1107 04/23/17 0802  AST 42* 42*  ALT 12* 15  ALKPHOS 66 58  BILITOT 0.9 0.6  PROT 7.3 5.8*  ALBUMIN 3.3* 2.5*   No results for input(s): LIPASE, AMYLASE in the last 168 hours. Recent Labs  Lab 04/23/17 1342 04/24/17 0854  AMMONIA 15 27    CBC: Recent Labs  Lab 04/21/17 1107  04/22/17 0449 04/22/17 2308 04/23/17 0802  04/24/17 0306 04/25/17 0322  WBC 12.6*  --  19.0* 21.4* 20.2* 19.5* 12.8*  NEUTROABS 10.2  --   --  16.8 17.4* 14.1*  --   HGB 15.3*   < > 13.3 12.4 11.7* 11.5* 12.2  HCT 49.8*   < > 43.3 39.2 37.1 37.1 38.7  MCV 96.5  --  95.2 93.8 94.6 95.9 93.9  PLT 58*  --  50* 43* 38* 39* 50*   < > = values in this interval not displayed.    Cardiac Enzymes: Recent Labs  Lab 04/23/17 1607 04/23/17 2032 04/24/17 0306  TROPONINI 0.07* 0.08* 0.09*    BNP: Invalid input(s): POCBNP  CBG: Recent Labs  Lab 04/21/17 1054 04/22/17 1912 04/22/17 2147  GLUCAP 85 111* 22    Microbiology: Results for orders placed or performed during the hospital encounter of 04/21/17  Culture, Urine     Status: Abnormal   Collection Time: 04/22/17  3:10 AM  Result Value Ref Range Status   Specimen Description   Final    URINE, CATHETERIZED Performed at [facility], [address], [place], [place] [address]    Special Requests   Final    NONE Performed at [facility], [address], [place], [place] [address]    Culture (A)  Final    >=100,000 COLONIES/mL ESCHERICHIA COLI Confirmed Extended Spectrum Beta-Lactamase Producer (ESBL).  In  bloodstream infections from ESBL organisms, carbapenems are preferred over piperacillin/tazobactam. They are shown to have a lower risk of mortality. Performed at [facility], [place] [address] [place], [place] [address]    Report Status 04/25/2017 FINAL  Final   Organism ID, Bacteria ESCHERICHIA COLI (A)  Final      Susceptibility   Escherichia coli - MIC*    AMPICILLIN >=32 RESISTANT Resistant     CEFAZOLIN >=64 RESISTANT Resistant     CEFTRIAXONE >=64 RESISTANT Resistant     CIPROFLOXACIN >=4 RESISTANT Resistant     GENTAMICIN <=1 SENSITIVE Sensitive     IMIPENEM <=0.25 SENSITIVE Sensitive     NITROFURANTOIN <=16 SENSITIVE Sensitive     TRIMETH/SULFA >=320 RESISTANT Resistant     AMPICILLIN/SULBACTAM >=32 RESISTANT Resistant     PIP/TAZO 16 SENSITIVE  Sensitive     Extended ESBL POSITIVE Resistant     * >=100,000 COLONIES/mL ESCHERICHIA COLI  Culture, blood (routine x 2)     Status: Abnormal (Preliminary result)   Collection Time: 04/22/17  8:02 AM  Result Value Ref Range Status   Specimen Description   Final    BLOOD RIGHT HAND Performed at [facility], [address], [place], [place] [address]    Special Requests   Final    BOTTLES DRAWN AEROBIC ONLY Blood Culture results may not be optimal due to an inadequate volume of blood received in culture bottles Performed at [facility], [address], [place], [place] [address]    Culture  Setup Time   Final    GRAM NEGATIVE RODS AEB ONLY Gram Stain Report Called to,Read Back By and Verified With: HENLEY,J @ 0600 ON 2.15.19 BY BOWMAN,L Performed at [facility], [address] [place], [place] [address]    Culture (A)  Final    ESCHERICHIA COLI SUSCEPTIBILITIES TO FOLLOW Performed at [facility], [place] [address], [place], [place] [address]    Report Status PENDING  Incomplete  Culture, blood (routine x 2)     Status: Abnormal   Collection Time: 04/22/17  9:00 AM  Result Value Ref Range Status   Specimen Description   Final    BLOOD LEFT HAND Performed at [facility], [address] [place], [place] [address]    Special Requests   Final    BOTTLES DRAWN AEROBIC AND ANAEROBIC Blood Culture adequate volume Performed at [facility], [address] [place], [place] [address]    Culture  Setup Time   Final    GRAM NEGATIVE RODS IN BOTH AEROBIC AND ANAEROBIC BOTTLES Gram Stain Report Called to,Read Back By and Verified With: HENLEY,J RN @ [facility] ON 12.15.19 BY BOWMAN,L CRITICAL RESULT CALLED TO, READ BACK BY AND VERIFIED WITH: PHARMD J GRIMSLEY [id number] MLM    Culture (A)  Final    ESCHERICHIA COLI Confirmed Extended Spectrum Beta-Lactamase Producer (ESBL).  In bloodstream infections from ESBL organisms, carbapenems are preferred over piperacillin/tazobactam. They are shown  to have a lower risk of mortality. Performed at [facility], [place] [address] [place],  [address]    Report Status 04/25/2017 FINAL  Final   Organism ID, Bacteria ESCHERICHIA COLI  Final      Susceptibility   Escherichia coli - MIC*    AMPICILLIN >=32 RESISTANT Resistant     CEFAZOLIN >=64 RESISTANT Resistant     CEFEPIME 16 INTERMEDIATE Intermediate     CEFTAZIDIME >=64 RESISTANT Resistant     CEFTRIAXONE >=64 RESISTANT Resistant  CIPROFLOXACIN >=4 RESISTANT Resistant     GENTAMICIN <=1 SENSITIVE Sensitive     IMIPENEM <=0.25 SENSITIVE Sensitive     TRIMETH/SULFA >=320 RESISTANT Resistant     AMPICILLIN/SULBACTAM >=32 RESISTANT Resistant     PIP/TAZO 8 SENSITIVE Sensitive     Extended ESBL POSITIVE Resistant     * ESCHERICHIA COLI  Blood Culture ID Panel (Reflexed)     Status: Abnormal   Collection Time: 04/22/17  9:00 AM  Result Value Ref Range Status   Enterococcus species NOT DETECTED NOT DETECTED Final   Listeria monocytogenes NOT DETECTED NOT DETECTED Final   Staphylococcus species NOT DETECTED NOT DETECTED Final   Staphylococcus aureus NOT DETECTED NOT DETECTED Final   Streptococcus species NOT DETECTED NOT DETECTED Final   Streptococcus agalactiae NOT DETECTED NOT DETECTED Final   Streptococcus pneumoniae NOT DETECTED NOT DETECTED Final   Streptococcus pyogenes NOT DETECTED NOT DETECTED Final   Acinetobacter baumannii NOT DETECTED NOT DETECTED Final   Enterobacteriaceae species DETECTED (A) NOT DETECTED Final    Comment: Enterobacteriaceae represent a large family of gram-negative bacteria, not a single organism. CRITICAL RESULT CALLED TO, READ BACK BY AND VERIFIED WITH: PHARMD J GRIMSLEY [id number] MLM    Enterobacter cloacae complex NOT DETECTED NOT DETECTED Final   Escherichia coli DETECTED (A) NOT DETECTED Final    Comment: CRITICAL RESULT CALLED TO, READ BACK BY AND VERIFIED WITH: PHARMD J GRIMSLEY [id number] MLM    Klebsiella oxytoca NOT  DETECTED NOT DETECTED Final   Klebsiella pneumoniae NOT DETECTED NOT DETECTED Final   Proteus species NOT DETECTED NOT DETECTED Final   Serratia marcescens NOT DETECTED NOT DETECTED Final   Carbapenem resistance NOT DETECTED NOT DETECTED Final   Haemophilus influenzae NOT DETECTED NOT DETECTED Final   Neisseria meningitidis NOT DETECTED NOT DETECTED Final   Pseudomonas aeruginosa NOT DETECTED NOT DETECTED Final   Candida albicans NOT DETECTED NOT DETECTED Final   Candida glabrata NOT DETECTED NOT DETECTED Final   Candida krusei NOT DETECTED NOT DETECTED Final   Candida parapsilosis NOT DETECTED NOT DETECTED Final   Candida tropicalis NOT DETECTED NOT DETECTED Final    Comment: Performed at [facility], [place], [place], [place] [address]    Coagulation Studies: No results for input(s): LABPROT, INR in the last 72 hours.  Urinalysis: No results for input(s): COLORURINE, LABSPEC, PHURINE, GLUCOSEU, HGBUR, BILIRUBINUR, KETONESUR, PROTEINUR, UROBILINOGEN, NITRITE, LEUKOCYTESUR in the last 72 hours.  Invalid input(s): APPERANCEUR    Imaging: Ct Head Wo Contrast  Result Date: 04/23/2017 CLINICAL DATA:  Altered level consciousness EXAM: CT HEAD WITHOUT CONTRAST TECHNIQUE: Contiguous axial images were obtained from the base of the skull through the vertex without intravenous contrast. COMPARISON:  None. FINDINGS: Brain: No evidence of acute infarction, hemorrhage, extra-axial collection, ventriculomegaly, or mass effect. Old, large right MCA territory infarct with encephalomalacia. Generalized cerebral atrophy. Periventricular white matter low attenuation likely secondary to microangiopathy. Vascular: Cerebrovascular atherosclerotic calcifications are noted. 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 Skull: Negative for fracture or focal lesion. Sinuses/Orbits: Visualized portions of the orbits are unremarkable. Visualized portions of the paranasal sinuses and mastoid  air cells are unremarkable. Other: None. IMPRESSION: 1. No acute intracranial pathology. 2. Old right MCA territory infarct with encephalomalacia. Electronically Signed   By: Kathreen Devoid   On: 04/23/2017 13:16     Medications:   . dextrose 125 mL/hr at 04/25/17 0442  . meropenem (MERREM) IV 1 g (04/25/17 1000)   .  amLODipine  10 mg Oral Daily  . divalproex  125 mg Oral BID  . divalproex  500 mg Oral BID  . levETIRAcetam  1,000 mg Oral Daily  . levETIRAcetam  1,500 mg Oral QHS  . levothyroxine  100 mcg Oral QAC breakfast  . nystatin  1 g Topical BID  . nystatin cream  1 application Topical BID  . olopatadine  1 drop Both Eyes BID  . pantoprazole  40 mg Oral QAC supper  . polyethylene glycol  17 g Oral Daily   acetaminophen **OR** acetaminophen (TYLENOL) oral liquid 160 mg/5 mL **OR** acetaminophen, hydrALAZINE, metoprolol tartrate  Assessment/ Plan:   Acute kidney injury in setting of shock and hypotension  The patient does appear volume depleted although urine sodium is 27 I think that this is Acute tubular Necrosis   There appears to have  been IV contrast administration  CT cerebral perfusion study  On 2/13 and this could also be contributory to the acute renal failure. I doubt that this represents Acute interstitial nephritis or acute glomerulonephritis.  Hypertension/volume appears to be volume depleted and will need IV fluids   Anemia stable  Hb 11.7   Amtibiotics continue meropenum 1g q 12 hours   Hypernatremia will change IVF to D5 W   Resolving   Will sign off  Please re-consult if needed  Thank you for allowing [place] to participate in this lady's care       LOS: 4 [name] W @TODAY @10 :50 AM

## 2017-04-26 LAB — CULTURE, BLOOD (ROUTINE X 2)

## 2017-04-26 MED ORDER — STROKE: EARLY STAGES OF RECOVERY BOOK: 1.0000 | Freq: Once | Status: DC

## 2017-04-26 MED ORDER — LABETALOL HCL 5 MG/ML IV SOLN
10.0000 mg | INTRAVENOUS | Status: DC | PRN
  Filled 2017-04-26: qty 4

## 2017-04-26 NOTE — Progress Notes (Signed)
  Speech Language Pathology Treatment: Dysphagia  Patient Details Name: [name] MRN: [id number] DOB: 12/31/1955 Today's Date: 04/26/2017 Time: 9379-0240 SLP Time Calculation (min) (ACUTE ONLY): 11 min  Assessment / Plan / Recommendation Clinical Impression  Sister at pt's bedside for dysphagia intervention. Pt reports eating and drinking has been "good". Oral mastication and transit adequate without residue and no indications of airway intrusion or pharyngeal residue with regular and thin liquids. Cognitive-speech eval ordered in initial order set. Pt states her memory is decreased. Overall pt and sister report that cognition is back to baseline (confused due urinary bacteremia on admission). ST encouraged her to have staff or family write important information for her (cannot use dominant hand due to hemiplegia). Pt states her daughter "writes information for me." Continue regular texture, thin liquids. No further ST needed.   HPI HPI: 62 y.o. 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 E. coli sepsis and bacteremia from urinary source.      SLP Plan  All goals met;Discharge SLP treatment due to (comment)       Recommendations  Diet recommendations: Regular;Thin liquid Liquids provided via: Cup;Straw Medication Administration: Whole meds with liquid Supervision: Patient able to self feed Compensations: Slow rate;Small sips/bites Postural Changes and/or Swallow Maneuvers: Seated upright 90 degrees                Oral Care Recommendations: Oral care BID SLP Visit Diagnosis: Dysphagia, unspecified (R13.10) Plan: All goals met;Discharge SLP treatment due to (comment)       GO                 Houston Siren 04/26/2017, 11:45 AM   Orbie Pyo Colvin Caroli.[employer] [phone]

## 2017-04-26 NOTE — Progress Notes (Signed)
Unable to collect labs this morning.  3 different lab techs attempted without success.  MD please advise

## 2017-04-26 NOTE — Plan of Care (Signed)
  Progressing Education: Knowledge of General Education information will improve 04/26/2017 2319 - Progressing by Talbert Forest, RN Nutrition: Adequate nutrition will be maintained 04/26/2017 2319 - Progressing by Talbert Forest, RN Pain Managment: General experience of comfort will improve 04/26/2017 2319 - Progressing by Talbert Forest, RN Safety: Ability to remain free from injury will improve 04/26/2017 2319 - Progressing by Talbert Forest, RN

## 2017-04-26 NOTE — Progress Notes (Signed)
PROGRESS NOTE    [name]  CNO:[id number] DOB: 01-18-1956 DOA: 04/21/2017 PCP: Hilbert Corrigan, MD   Assessment & Plan:   Principal Problem:   Severe sepsis with septic shock [facility]) Active Problems:   Morbid obesity (HCC)   History of stroke   Thrombocytopenia, unspecified ([place])   Central sleep apnea   Hemiparesis affecting left side as late effect of cerebrovascular accident [facility])   Hypothyroidism   Seizure disorder ([place])   Lower urinary tract infectious disease   HTN (hypertension)   AKI (acute kidney injury) ([place])   CVA (cerebral vascular accident) ([place])  Brief Narrative: Patient is a 62 year old female with past medical history of stroke, severe left-sided hemiparesis as well as neglect, seizure disorder, sleep apnea, hypertension, hypothyroidism, morbid obesity, chronic nephrolithiasis who resides at [facility] (bedbound) was transferred to [facility] from [facility] on 04/22/17 (admitted to [facility] on 04/21/17)  with concerns about altered mental status and sepsis. She was found to have an obstructing right calculus , on 04/22/17 underwent cystoscopy with right ureteral stent placement for urosepsis.  Urine culture with ESBL E. coli, blood cultures also with E. coli   Plan:- 1)ESBL E Coli Severe sepsis with septic shock: Clinically improving, white count down to 12.9 from 21.4,  afebrile, clinically/hemodynamically better, resolved,  pro-calcitonin is down to 10.2 from 48.6,  c/n  Meropenem (started 04/23/17) for E. coli sepsis and bacteremia from urinary source, possible d/c to SNF on Invanz to complete 7 day.   2)Altered mental status: Now resolved, Multifactorial, suspect due to toxic metabolic encephalopathy mostly due to combination of severe sepsis and uremia from renal dysfunction , CT head without acute findings , patient is awake, passed a swallow eval at bedside on 04/24/2017   3)Acute kidney injury: Mostly resolved, creatinine is down to 1.25 from 2.69,  most  likely due to hypotension in the setting of septic shock, compounded by recent Contrast study,  urine output is better, c/n  IV fluids to D5w due to hypernatremia and hyperchloremia,  nephrology input noted   4)Nephrolithiasis/ESBL E coli pyelonephritis: Most likely the source of sepsis.  on 04/22/17 underwent cystoscopy with right ureteral stent placement for urosepsis.   Plan is to remove the stent and stone  as an outpatient . CT imaging showed:Right hydronephrosis, renal pelvic stone, bilateral abnormal delayed nephrograms compatible with acute renal injury, heterogeneity of the left kidney parenchyma possibly consistent with pyelonephritis  5)H/o Stroke: Has history of prior right-sided MCA stroke with dense left hemiparesis and neglect.  She is bedbound.  Patient also has residual facial droop.  Clinical exam and repeat CTA without new acute findings   6)Seizure disorder:   EEG did not show any seizure activity, no evidence of new seizures, continue Keppra and Depakote  7)Thrombocytopenia:Chronic.  Most likely exacerbated by sepsis now .  We will continue to monitor the counts.  8)Hypothyroidism: Continue home Synthyroid.  9)Sleep apnea: Continue CPAP/BiPAP at night.   DVT prophylaxis: SCD Code Status: Full Disposition Plan:   possible d/c to SNF on Invanz to complete 7 day.    Consultants: PCCM, nephrology, urology  Procedures: Ureteral Stent placement on the right side on 04/22/17  Antimicrobials: Meropenem since 04/23/17  Subjective:  Sister at bedside, eating and drinking well, conversational, unable to get labs this a.m. due to poor veins  Objective: Vitals:   04/25/17 2333 04/26/17 0626 04/26/17 1046 04/26/17 1213  BP:  (!) 156/90 134/74 (!) 154/78  Pulse: 95 (!) 101  99  Resp: 20 20  18   Temp: 99.9 F (37.7 C) 99.1 F (37.3 C)  99 F (37.2 C)  TempSrc:  Oral  Oral  SpO2:  92%  100%  Weight:      Height:        Intake/Output Summary (Last 24 hours) at 04/26/2017  1747 Last data filed at 04/26/2017 1727 Gross per 24 hour  Intake 4505 ml  Output 3689 ml  Net 816 ml   Filed Weights   04/21/17 1136 04/24/17 0400  Weight: 97.5 kg (215 lb) 123.1 kg (271 lb 6.2 oz)    Examination:  General exam: More awake , morbidly obese HEENT:PERRL,Oral mucosa moist, Ear/Nose normal on gross exam Respiratory system: Improved air movement  cardiovascular system: RRR. No JVD, Gastrointestinal system: Abdomen is nondistended, soft and nontender.  Increased truncal adiposity,  bowel sounds present, Central nervous system:  facial droop , left-sided hemiparesis unchanged  extremities: No edema,  distal peripheral pulses palpable. Skin: No rashes, lesions or ulcers,no icterus ,no pallor GU-Foley with clear urine   CBC: Recent Labs  Lab 04/21/17 1107  04/22/17 0449 04/22/17 2308 04/23/17 0802 04/24/17 0306 04/25/17 0322  WBC 12.6*  --  19.0* 21.4* 20.2* 19.5* 12.8*  NEUTROABS 10.2  --   --  16.8 17.4* 14.1*  --   HGB 15.3*   < > 13.3 12.4 11.7* 11.5* 12.2  HCT 49.8*   < > 43.3 39.2 37.1 37.1 38.7  MCV 96.5  --  95.2 93.8 94.6 95.9 93.9  PLT 58*  --  50* 43* 38* 39* 50*   < > = values in this interval not displayed.   Basic Metabolic Panel: Recent Labs  Lab 04/22/17 0449 04/22/17 2308 04/23/17 0802 04/24/17 0306 04/25/17 0322  NA 146* 149* 149* 150* 146*  K 4.4 4.1 4.0 4.0 3.6  CL 108 112* 115* 120* 115*  CO2 24 22 23  20* 24  GLUCOSE 69 91 86 75 147*  BUN 39* 48* 52* 40* 28*  CREATININE 2.69* 2.55* 2.57* 1.57* 1.25*  CALCIUM 8.4* 7.6* 7.1* 6.9* 7.1*   GFR: Estimated Creatinine Clearance: 59 mL/min (A) (by C-G formula based on SCr of 1.25 mg/dL (H)). Liver Function Tests: Recent Labs  Lab 04/21/17 1107 04/23/17 0802  AST 42* 42*  ALT 12* 15  ALKPHOS 66 58  BILITOT 0.9 0.6  PROT 7.3 5.8*  ALBUMIN 3.3* 2.5*   No results for input(s): LIPASE, AMYLASE in the last 168 hours. Recent Labs  Lab 04/23/17 1342 04/24/17 0854  AMMONIA 15 27    Coagulation Profile: Recent Labs  Lab 04/21/17 1107  INR 1.22   Cardiac Enzymes: Recent Labs  Lab 04/23/17 1607 04/23/17 2032 04/24/17 0306  TROPONINI 0.07* 0.08* 0.09*   BNP (last 3 results) No results for input(s): PROBNP in the last 8760 hours. HbA1C: No results for input(s): HGBA1C in the last 72 hours. CBG: Recent Labs  Lab 04/21/17 1054 04/22/17 1912 04/22/17 2147  GLUCAP 85 111* 98   Lipid Profile: No results for input(s): CHOL, HDL, LDLCALC, TRIG, CHOLHDL, LDLDIRECT in the last 72 hours. Thyroid Function Tests: No results for input(s): TSH, T4TOTAL, FREET4, T3FREE, THYROIDAB in the last 72 hours. Anemia Panel: No results for input(s): VITAMINB12, FOLATE, FERRITIN, TIBC, IRON, RETICCTPCT in the last 72 hours. Sepsis Labs: Recent Labs  Lab 04/22/17 0900 04/22/17 2308 04/23/17 0254 04/23/17 1342 04/24/17 0306 04/25/17 0322  PROCALCITON  --   --   --  48.65 26.47 10.29  LATICACIDVEN 2.0* 1.9 1.6  --   --   --     Recent Results (from the past 240 hour(s))  Culture, Urine     Status: Abnormal   Collection Time: 04/22/17  3:10 AM  Result Value Ref Range Status   Specimen Description   Final    URINE, CATHETERIZED Performed at [facility], [address] [place], [place] [address]    Special Requests   Final    NONE Performed at [facility], [address] [place], [place] [address]    Culture (A)  Final    >=100,000 COLONIES/mL ESCHERICHIA COLI Confirmed Extended Spectrum Beta-Lactamase Producer (ESBL).  In bloodstream infections from ESBL organisms, carbapenems are preferred over piperacillin/tazobactam. They are shown to have a lower risk of mortality. Performed at [facility], [place] [address], [place], [place] [address]    Report Status 04/25/2017 FINAL  Final   Organism ID, Bacteria ESCHERICHIA COLI (A)  Final      Susceptibility   Escherichia coli - MIC*    AMPICILLIN >=32 RESISTANT Resistant     CEFAZOLIN >=64 RESISTANT Resistant      CEFTRIAXONE >=64 RESISTANT Resistant     CIPROFLOXACIN >=4 RESISTANT Resistant     GENTAMICIN <=1 SENSITIVE Sensitive     IMIPENEM <=0.25 SENSITIVE Sensitive     NITROFURANTOIN <=16 SENSITIVE Sensitive     TRIMETH/SULFA >=320 RESISTANT Resistant     AMPICILLIN/SULBACTAM >=32 RESISTANT Resistant     PIP/TAZO 16 SENSITIVE Sensitive     Extended ESBL POSITIVE Resistant     * >=100,000 COLONIES/mL ESCHERICHIA COLI  Culture, blood (routine x 2)     Status: Abnormal   Collection Time: 04/22/17  8:02 AM  Result Value Ref Range Status   Specimen Description   Final    BLOOD RIGHT HAND Performed at [facility], [address] [place], [place] [address]    Special Requests   Final    BOTTLES DRAWN AEROBIC ONLY Blood Culture results may not be optimal due to an inadequate volume of blood received in culture bottles Performed at [facility], [address], [place], [place] [address]    Culture  Setup Time   Final    GRAM NEGATIVE RODS AEB ONLY Gram Stain Report Called to,Read Back By and Verified With: HENLEY,J @ 0600 ON 2.15.19 BY BOWMAN,L Performed at [facility], [address], [place], [place] [address]    Culture (A)  Final    ESCHERICHIA COLI SUSCEPTIBILITIES PERFORMED ON PREVIOUS CULTURE WITHIN THE LAST 5 DAYS. Performed at [facility], [place] [address], [place], [place] [address]    Report Status 04/26/2017 FINAL  Final  Culture, blood (routine x 2)     Status: Abnormal   Collection Time: 04/22/17  9:00 AM  Result Value Ref Range Status   Specimen Description   Final    BLOOD LEFT HAND Performed at [facility], [address], [place], [place] [address]    Special Requests   Final    BOTTLES DRAWN AEROBIC AND ANAEROBIC Blood Culture adequate volume Performed at [facility], [address], [place], [place] [address]    Culture  Setup Time   Final    GRAM NEGATIVE RODS IN BOTH AEROBIC AND ANAEROBIC BOTTLES Gram Stain Report Called to,Read Back By and Verified With:  HENLEY,J RN @ [facility] ON 12.15.19 BY BOWMAN,L CRITICAL RESULT CALLED TO, READ BACK BY AND VERIFIED WITH: PHARMD Lavell Luster [id number] MLM  Culture (A)  Final    ESCHERICHIA COLI Confirmed Extended Spectrum Beta-Lactamase Producer (ESBL).  In bloodstream infections from ESBL organisms, carbapenems are preferred over piperacillin/tazobactam. They are shown to have a lower risk of mortality. Performed at [facility], [place] [address] [place], [place] [address]    Report Status 04/25/2017 FINAL  Final   Organism ID, Bacteria ESCHERICHIA COLI  Final      Susceptibility   Escherichia coli - MIC*    AMPICILLIN >=32 RESISTANT Resistant     CEFAZOLIN >=64 RESISTANT Resistant     CEFEPIME 16 INTERMEDIATE Intermediate     CEFTAZIDIME >=64 RESISTANT Resistant     CEFTRIAXONE >=64 RESISTANT Resistant     CIPROFLOXACIN >=4 RESISTANT Resistant     GENTAMICIN <=1 SENSITIVE Sensitive     IMIPENEM <=0.25 SENSITIVE Sensitive     TRIMETH/SULFA >=320 RESISTANT Resistant     AMPICILLIN/SULBACTAM >=32 RESISTANT Resistant     PIP/TAZO 8 SENSITIVE Sensitive     Extended ESBL POSITIVE Resistant     * ESCHERICHIA COLI  Blood Culture ID Panel (Reflexed)     Status: Abnormal   Collection Time: 04/22/17  9:00 AM  Result Value Ref Range Status   Enterococcus species NOT DETECTED NOT DETECTED Final   Listeria monocytogenes NOT DETECTED NOT DETECTED Final   Staphylococcus species NOT DETECTED NOT DETECTED Final   Staphylococcus aureus NOT DETECTED NOT DETECTED Final   Streptococcus species NOT DETECTED NOT DETECTED Final   Streptococcus agalactiae NOT DETECTED NOT DETECTED Final   Streptococcus pneumoniae NOT DETECTED NOT DETECTED Final   Streptococcus pyogenes NOT DETECTED NOT DETECTED Final   Acinetobacter baumannii NOT DETECTED NOT DETECTED Final   Enterobacteriaceae species DETECTED (A) NOT DETECTED Final    Comment: Enterobacteriaceae represent a large family of gram-negative bacteria, not a single  organism. CRITICAL RESULT CALLED TO, READ BACK BY AND VERIFIED WITH: PHARMD J GRIMSLEY [id number] MLM    Enterobacter cloacae complex NOT DETECTED NOT DETECTED Final   Escherichia coli DETECTED (A) NOT DETECTED Final    Comment: CRITICAL RESULT CALLED TO, READ BACK BY AND VERIFIED WITH: PHARMD J GRIMSLEY [id number] MLM    Klebsiella oxytoca NOT DETECTED NOT DETECTED Final   Klebsiella pneumoniae NOT DETECTED NOT DETECTED Final   Proteus species NOT DETECTED NOT DETECTED Final   Serratia marcescens NOT DETECTED NOT DETECTED Final   Carbapenem resistance NOT DETECTED NOT DETECTED Final   Haemophilus influenzae NOT DETECTED NOT DETECTED Final   Neisseria meningitidis NOT DETECTED NOT DETECTED Final   Pseudomonas aeruginosa NOT DETECTED NOT DETECTED Final   Candida albicans NOT DETECTED NOT DETECTED Final   Candida glabrata NOT DETECTED NOT DETECTED Final   Candida krusei NOT DETECTED NOT DETECTED Final   Candida parapsilosis NOT DETECTED NOT DETECTED Final   Candida tropicalis NOT DETECTED NOT DETECTED Final    Comment: Performed at [facility], [place]. [address], [place], [place] [address]  MRSA PCR Screening     Status: None   Collection Time: 04/25/17  4:18 PM  Result Value Ref Range Status   MRSA by PCR NEGATIVE NEGATIVE Final    Comment:        The GeneXpert MRSA Assay (FDA approved for NASAL specimens only), is one component of a comprehensive MRSA colonization surveillance program. It is not intended to diagnose MRSA infection nor to guide or monitor treatment for MRSA infections. Performed at [facility], [place] [address] [place],  [address]  Radiology Studies: No results found.  Scheduled Meds: .  stroke: mapping our early stages of recovery book  1 each Does not apply Once  . amLODipine  10 mg Oral Daily  . divalproex  125 mg Oral BID  . divalproex  500 mg Oral BID  . levETIRAcetam  1,000 mg Oral Daily  . levETIRAcetam  1,500  mg Oral QHS  . levothyroxine  100 mcg Oral QAC breakfast  . nystatin  1 g Topical BID  . nystatin cream  1 application Topical BID  . olopatadine  1 drop Both Eyes BID  . pantoprazole  40 mg Oral QAC supper  . polyethylene glycol  17 g Oral Daily   Continuous Infusions: . dextrose 125 mL/hr at 04/26/17 1700  . meropenem (MERREM) IV 1 g (04/26/17 1701)     LOS: 5 days     Roxan Hockey, MD [facility] Pager [phone]  If 7PM-7AM, please contact night-coverage [email or web address] Password [facility] 04/26/2017, 5:47 PM

## 2017-04-26 NOTE — Care Management Important Message (Signed)
Important Message  Patient Details IM Letter given to Kathy/Case Manager to present to the Patient Name: [name] MRN: [id number] Date of Birth: Nov 20, 1955   Medicare Important Message Given:  Yes    Kerin Salen 04/26/2017, 11:18 AM

## 2017-04-26 NOTE — Anesthesia Postprocedure Evaluation (Signed)
Anesthesia Post Note  Patient: [name]  Procedure(s) Performed: CYSTOSCOPY WITH BILATERAL  RETROGRADE PYELOGRAM, RIGHT URETERAL STENT PLACEMENT (Bilateral )     Patient location during evaluation: PACU Anesthesia Type: General Level of consciousness: awake and alert Pain management: pain level controlled Vital Signs Assessment: post-procedure vital signs reviewed and stable Respiratory status: spontaneous breathing, nonlabored ventilation, respiratory function stable and patient connected to nasal cannula oxygen Cardiovascular status: blood pressure returned to baseline and stable Postop Assessment: no apparent nausea or vomiting Anesthetic complications: no    Last Vitals:  Vitals:   04/25/17 2333 04/26/17 0626  BP:  (!) 156/90  Pulse: 95 (!) 101  Resp: 20 20  Temp: 37.7 C 37.3 C  SpO2:  92%    Last Pain:  Vitals:   04/26/17 0657  TempSrc:   PainSc: 10-Worst pain ever                 Deshayla Empson S

## 2017-04-26 NOTE — Addendum Note (Signed)
Addendum  created 04/26/17 0746 by Myrtie Soman, MD   Intraprocedure Staff edited

## 2017-04-27 LAB — BASIC METABOLIC PANEL
Anion gap: 10 (ref 5–15)
BUN: 14 mg/dL (ref 6–20)
CHLORIDE: 111 mmol/L (ref 101–111)
CO2: 23 mmol/L (ref 22–32)
CREATININE: 0.89 mg/dL (ref 0.44–1.00)
Calcium: 7.7 mg/dL — ABNORMAL LOW (ref 8.9–10.3)
GFR calc Af Amer: >60 mL/min (ref >60)
GFR calc non Af Amer: >60 mL/min (ref >60)
Glucose, Bld: 123 mg/dL — ABNORMAL HIGH (ref 65–99)
Potassium: 3.2 mmol/L — ABNORMAL LOW (ref 3.5–5.1)
SODIUM: 144 mmol/L (ref 135–145)

## 2017-04-27 LAB — CBC
HCT: 37.5 % (ref 36.0–46.0)
HEMOGLOBIN: 12.4 g/dL (ref 12.0–15.0)
MCH: 30 pg (ref 26.0–34.0)
MCHC: 33.1 g/dL (ref 30.0–36.0)
MCV: 90.8 fL (ref 78.0–100.0)
Platelets: 127 10*3/uL — ABNORMAL LOW (ref 150–400)
RBC: 4.13 MIL/uL (ref 3.87–5.11)
RDW: 16 % — AB (ref 11.5–15.5)
WBC: 14.7 10*3/uL — ABNORMAL HIGH (ref 4.0–10.5)

## 2017-04-27 LAB — GLUCOSE, CAPILLARY
GLUCOSE-CAPILLARY: 103 mg/dL — AB (ref 65–99)
Glucose-Capillary: 67 mg/dL (ref 65–99)
Glucose-Capillary: 114 mg/dL — ABNORMAL HIGH (ref 65–99)

## 2017-04-27 MED ORDER — POTASSIUM CHLORIDE CRYS ER 20 MEQ PO TBCR
40.0000 meq | EXTENDED_RELEASE_TABLET | Freq: Once | ORAL | Status: AC
  Administered 2017-04-27: 40 meq via ORAL
  Filled 2017-04-27: qty 2

## 2017-04-27 MED ORDER — POTASSIUM CHLORIDE CRYS ER 20 MEQ PO TBCR
40.0000 meq | EXTENDED_RELEASE_TABLET | Freq: Once | ORAL | Status: AC
  Administered 2017-04-28: 40 meq via ORAL
  Filled 2017-04-27: qty 2

## 2017-04-27 NOTE — Plan of Care (Signed)
  Progressing Education: Knowledge of General Education information will improve 04/27/2017 2208 - Progressing by Talbert Forest, RN Nutrition: Adequate nutrition will be maintained 04/27/2017 2208 - Progressing by Talbert Forest, RN Elimination: Will not experience complications related to bowel motility 04/27/2017 2208 - Progressing by Talbert Forest, RN Will not experience complications related to urinary retention 04/27/2017 2208 - Progressing by Talbert Forest, RN Pain Managment: General experience of comfort will improve 04/27/2017 2208 - Progressing by Talbert Forest, RN Safety: Ability to remain free from injury will improve 04/27/2017 2208 - Progressing by Talbert Forest, RN

## 2017-04-27 NOTE — Progress Notes (Signed)
PROGRESS NOTE    [name]  YBW:[id number] DOB: [birth year] DOA: 04/21/2017 PCP: Hilbert Corrigan, MD   Assessment & Plan:   Principal Problem:   Severe sepsis with septic shock [facility]) Active Problems:   Morbid obesity (HCC)   History of stroke   Thrombocytopenia, unspecified ([place])   Central sleep apnea   Hemiparesis affecting left side as late effect of cerebrovascular accident [facility])   Hypothyroidism   Seizure disorder ([place])   Lower urinary tract infectious disease   HTN (hypertension)   AKI (acute kidney injury) ([place])   CVA (cerebral vascular accident) ([place])  Brief Narrative: Patient is a 62 year old female with past medical history of stroke, severe left-sided hemiparesis as well as neglect, seizure disorder, sleep apnea, hypertension, hypothyroidism, morbid obesity, chronic nephrolithiasis who resides at [facility] (bedbound) was transferred to [facility] from [facility] on 04/22/17 (admitted to [facility] on 04/21/17)  with concerns about altered mental status and sepsis. She was found to have an obstructing right calculus , on 04/22/17 underwent cystoscopy with right ureteral stent placement for urosepsis.  Urine culture with ESBL E. coli, blood cultures also with E. coli   Plan:- 1)ESBL E Coli Severe sepsis with septic shock: Clinically improving,  afebrile,  hypotension is resolved, tachycardia is mostly resolved,  pro-calcitonin is down to 10.2 from 48.6,  c/n  meropenem for E. coli sepsis and bacteremia from urinary source  2)Altered mental status: Multifactorial, cognitively much better,  suspect AMS was due to toxic metabolic encephalopathy mostly due to combination of severe sepsis and uremia from renal dysfunction , CT head without acute findings , patient is more awake, passed a swallow eval at bedside on 04/24/2017, eating well   3)Acute kidney injury: Mostly resolved, creatinine is down to 0.8 from 2.69,  most likely due to hypotension in the setting of septic shock,  compounded by recent Contrast study,  urine output is better, c/n  IV fluids to D5w, improved hypernatremia and hyperchloremia, discussed with Dr. Justin Mend from nephrology service  4)Nephrolithiasis/pyelonephritis: Most likely the source of sepsis.  on 04/22/17 underwent cystoscopy with right ureteral stent placement for urosepsis.   Plan is to remove the stent and stone  as an outpatient . CT imaging showed:Right hydronephrosis, renal pelvic stone, bilateral abnormal delayed nephrograms compatible with acute renal injury, heterogeneity of the left kidney parenchyma possibly consistent with pyelonephritis  5)H/o Stroke: Has history of prior right-sided MCA stroke with dense left hemiparesis and neglect.  She is bedbound.  Patient also has residual facial droop.  Clinical exam and repeat CTA without new acute findings   6)Seizure disorder:   EEG did not show any seizure activity, no evidence of new seizures, continue Keppra and Depakote  7)Thrombocytopenia:Chronic.  Most likely exacerbated by sepsis now .  We will continue to monitor the counts.  8)Hypothyroidism: Continue home Synthyroid.  9)Sleep apnea: Continue CPAP/BiPAP at night.   DVT prophylaxis: SCD Code Status: Full Disposition Plan: Unknown at this time   Consultants: PCCM, nephrology, urology  Procedures: Ureteral Stent placement on the right side on 04/22/17  Antimicrobials: Meropenem since 04/23/17  Subjective: Less lethargic, woke up well after BiPAP overnight, she started back at bedside, questions answered urine output is better  Objective: Vitals:   04/26/17 2239 04/27/17 0443 04/27/17 0524 04/27/17 1500  BP:  (!) 176/88 (!) 153/87 (!) 160/85  Pulse: 94 100  95  Resp: 20 (!) 24  20  Temp:  98.4 F (36.9 C)  98.8 F (37.1  C)  TempSrc:  Oral  Oral  SpO2: 98% 100%  100%  Weight:      Height:        Intake/Output Summary (Last 24 hours) at 04/27/2017 1736 Last data filed at 04/27/2017 1613 Gross per 24 hour  Intake  3803.33 ml  Output 2450 ml  Net 1353.33 ml   Filed Weights   04/21/17 1136 04/24/17 0400  Weight: 97.5 kg (215 lb) 123.1 kg (271 lb 6.2 oz)    Examination:  General exam: More awake , morbidly obese, talking  HEENT:PERRL,Oral mucosa moist, Respiratory system: Decreased in bases,  Cardiovascular system: RRR. No JVD, Gastrointestinal system: Abdomen is nondistended, soft and nontender. No organomegaly or masses felt.  Bowel sounds present, Central nervous system:  facial droop , left-sided hemiparesis unchanged  extremities: No edema,  distal peripheral pulses palpable. Skin: No rashes, lesions or ulcers,no icterus ,no pallor GU-Foley with clear urine, will remove Foley   CBC: Recent Labs  Lab 04/21/17 1107  04/22/17 2308 04/23/17 0802 04/24/17 0306 04/25/17 0322 04/27/17 0507  WBC 12.6*   < > 21.4* 20.2* 19.5* 12.8* 14.7*  NEUTROABS 10.2  --  16.8 17.4* 14.1*  --   --   HGB 15.3*   < > 12.4 11.7* 11.5* 12.2 12.4  HCT 49.8*   < > 39.2 37.1 37.1 38.7 37.5  MCV 96.5   < > 93.8 94.6 95.9 93.9 90.8  PLT 58*   < > 43* 38* 39* 50* 127*   < > = values in this interval not displayed.   Basic Metabolic Panel: Recent Labs  Lab 04/22/17 2308 04/23/17 0802 04/24/17 0306 04/25/17 0322 04/27/17 0507  NA 149* 149* 150* 146* 144  K 4.1 4.0 4.0 3.6 3.2*  CL 112* 115* 120* 115* 111  CO2 22 23 20* 24 23  GLUCOSE 91 86 75 147* 123*  BUN 48* 52* 40* 28* 14  CREATININE 2.55* 2.57* 1.57* 1.25* 0.89  CALCIUM 7.6* 7.1* 6.9* 7.1* 7.7*   GFR: Estimated Creatinine Clearance: 82.9 mL/min (by C-G formula based on SCr of 0.89 mg/dL). Liver Function Tests: Recent Labs  Lab 04/21/17 1107 04/23/17 0802  AST 42* 42*  ALT 12* 15  ALKPHOS 66 58  BILITOT 0.9 0.6  PROT 7.3 5.8*  ALBUMIN 3.3* 2.5*   No results for input(s): LIPASE, AMYLASE in the last 168 hours. Recent Labs  Lab 04/23/17 1342 04/24/17 0854  AMMONIA 15 27   Coagulation Profile: Recent Labs  Lab 04/21/17 1107  INR  1.22   Cardiac Enzymes: Recent Labs  Lab 04/23/17 1607 04/23/17 2032 04/24/17 0306  TROPONINI 0.07* 0.08* 0.09*   BNP (last 3 results) No results for input(s): PROBNP in the last 8760 hours. HbA1C: No results for input(s): HGBA1C in the last 72 hours. CBG: Recent Labs  Lab 04/22/17 1828 04/22/17 1845 04/22/17 1912 04/22/17 2147 04/27/17 0735  GLUCAP 67 114* 111* 98 103*   Lipid Profile: No results for input(s): CHOL, HDL, LDLCALC, TRIG, CHOLHDL, LDLDIRECT in the last 72 hours. Thyroid Function Tests: No results for input(s): TSH, T4TOTAL, FREET4, T3FREE, THYROIDAB in the last 72 hours. Anemia Panel: No results for input(s): VITAMINB12, FOLATE, FERRITIN, TIBC, IRON, RETICCTPCT in the last 72 hours. Sepsis Labs: Recent Labs  Lab 04/22/17 0900 04/22/17 2308 04/23/17 0254 04/23/17 1342 04/24/17 0306 04/25/17 0322  PROCALCITON  --   --   --  48.65 26.47 10.29  LATICACIDVEN 2.0* 1.9 1.6  --   --   --  Recent Results (from the past 240 hour(s))  Culture, Urine     Status: Abnormal   Collection Time: 04/22/17  3:10 AM  Result Value Ref Range Status   Specimen Description   Final    URINE, CATHETERIZED Performed at [facility], [address], [place], [place] [address]    Special Requests   Final    NONE Performed at [facility], [address], [place], [place] [address]    Culture (A)  Final    >=100,000 COLONIES/mL ESCHERICHIA COLI Confirmed Extended Spectrum Beta-Lactamase Producer (ESBL).  In bloodstream infections from ESBL organisms, carbapenems are preferred over piperacillin/tazobactam. They are shown to have a lower risk of mortality. Performed at [facility], [place] [address] [place], [place] [address]    Report Status 04/25/2017 FINAL  Final   Organism ID, Bacteria ESCHERICHIA COLI (A)  Final      Susceptibility   Escherichia coli - MIC*    AMPICILLIN >=32 RESISTANT Resistant     CEFAZOLIN >=64 RESISTANT Resistant     CEFTRIAXONE >=64  RESISTANT Resistant     CIPROFLOXACIN >=4 RESISTANT Resistant     GENTAMICIN <=1 SENSITIVE Sensitive     IMIPENEM <=0.25 SENSITIVE Sensitive     NITROFURANTOIN <=16 SENSITIVE Sensitive     TRIMETH/SULFA >=320 RESISTANT Resistant     AMPICILLIN/SULBACTAM >=32 RESISTANT Resistant     PIP/TAZO 16 SENSITIVE Sensitive     Extended ESBL POSITIVE Resistant     * >=100,000 COLONIES/mL ESCHERICHIA COLI  Culture, blood (routine x 2)     Status: Abnormal   Collection Time: 04/22/17  8:02 AM  Result Value Ref Range Status   Specimen Description   Final    BLOOD RIGHT HAND Performed at [facility], [address] [place], [place] [address]    Special Requests   Final    BOTTLES DRAWN AEROBIC ONLY Blood Culture results may not be optimal due to an inadequate volume of blood received in culture bottles Performed at [facility], [address] [place], [place] [address]    Culture  Setup Time   Final    GRAM NEGATIVE RODS AEB ONLY Gram Stain Report Called to,Read Back By and Verified With: HENLEY,J @ 0600 ON 2.15.19 BY BOWMAN,L Performed at [facility], [address], [place], [place] [address]    Culture (A)  Final    ESCHERICHIA COLI SUSCEPTIBILITIES PERFORMED ON PREVIOUS CULTURE WITHIN THE LAST 5 DAYS. Performed at [facility], [place] [address] [place], [place] [address]    Report Status 04/26/2017 FINAL  Final  Culture, blood (routine x 2)     Status: Abnormal   Collection Time: 04/22/17  9:00 AM  Result Value Ref Range Status   Specimen Description   Final    BLOOD LEFT HAND Performed at [facility], [address] [place], [place] [address]    Special Requests   Final    BOTTLES DRAWN AEROBIC AND ANAEROBIC Blood Culture adequate volume Performed at [facility], [address], [place], [place] [address]    Culture  Setup Time   Final    GRAM NEGATIVE RODS IN BOTH AEROBIC AND ANAEROBIC BOTTLES Gram Stain Report Called to,Read Back By and Verified With: HENLEY,J RN @ [facility] ON  12.15.19 BY BOWMAN,L CRITICAL RESULT CALLED TO, READ BACK BY AND VERIFIED WITH: PHARMD J GRIMSLEY [id number] MLM    Culture (A)  Final    ESCHERICHIA COLI Confirmed Extended Spectrum Beta-Lactamase Producer (ESBL).  In bloodstream infections from ESBL organisms, carbapenems are preferred over piperacillin/tazobactam. They are shown to have a lower risk of mortality. Performed at [facility], [place] [address], [place], [place] [address]    Report Status 04/25/2017 FINAL  Final   Organism ID, Bacteria ESCHERICHIA COLI  Final      Susceptibility   Escherichia coli - MIC*    AMPICILLIN >=32 RESISTANT Resistant     CEFAZOLIN >=64 RESISTANT Resistant     CEFEPIME 16 INTERMEDIATE Intermediate     CEFTAZIDIME >=64 RESISTANT Resistant     CEFTRIAXONE >=64 RESISTANT Resistant     CIPROFLOXACIN >=4 RESISTANT Resistant     GENTAMICIN <=1 SENSITIVE Sensitive     IMIPENEM <=0.25 SENSITIVE Sensitive     TRIMETH/SULFA >=320 RESISTANT Resistant     AMPICILLIN/SULBACTAM >=32 RESISTANT Resistant     PIP/TAZO 8 SENSITIVE Sensitive     Extended ESBL POSITIVE Resistant     * ESCHERICHIA COLI  Blood Culture ID Panel (Reflexed)     Status: Abnormal   Collection Time: 04/22/17  9:00 AM  Result Value Ref Range Status   Enterococcus species NOT DETECTED NOT DETECTED Final   Listeria monocytogenes NOT DETECTED NOT DETECTED Final   Staphylococcus species NOT DETECTED NOT DETECTED Final   Staphylococcus aureus NOT DETECTED NOT DETECTED Final   Streptococcus species NOT DETECTED NOT DETECTED Final   Streptococcus agalactiae NOT DETECTED NOT DETECTED Final   Streptococcus pneumoniae NOT DETECTED NOT DETECTED Final   Streptococcus pyogenes NOT DETECTED NOT DETECTED Final   Acinetobacter baumannii NOT DETECTED NOT DETECTED Final   Enterobacteriaceae species DETECTED (A) NOT DETECTED Final    Comment: Enterobacteriaceae represent a large family of gram-negative bacteria, not a single organism. CRITICAL  RESULT CALLED TO, READ BACK BY AND VERIFIED WITH: PHARMD J GRIMSLEY [id number] MLM    Enterobacter cloacae complex NOT DETECTED NOT DETECTED Final   Escherichia coli DETECTED (A) NOT DETECTED Final    Comment: CRITICAL RESULT CALLED TO, READ BACK BY AND VERIFIED WITH: PHARMD J GRIMSLEY [id number] MLM    Klebsiella oxytoca NOT DETECTED NOT DETECTED Final   Klebsiella pneumoniae NOT DETECTED NOT DETECTED Final   Proteus species NOT DETECTED NOT DETECTED Final   Serratia marcescens NOT DETECTED NOT DETECTED Final   Carbapenem resistance NOT DETECTED NOT DETECTED Final   Haemophilus influenzae NOT DETECTED NOT DETECTED Final   Neisseria meningitidis NOT DETECTED NOT DETECTED Final   Pseudomonas aeruginosa NOT DETECTED NOT DETECTED Final   Candida albicans NOT DETECTED NOT DETECTED Final   Candida glabrata NOT DETECTED NOT DETECTED Final   Candida krusei NOT DETECTED NOT DETECTED Final   Candida parapsilosis NOT DETECTED NOT DETECTED Final   Candida tropicalis NOT DETECTED NOT DETECTED Final    Comment: Performed at [facility], [place]. [address] [place], [place] [address]  MRSA PCR Screening     Status: None   Collection Time: 04/25/17  4:18 PM  Result Value Ref Range Status   MRSA by PCR NEGATIVE NEGATIVE Final    Comment:        The GeneXpert MRSA Assay (FDA approved for NASAL specimens only), is one component of a comprehensive MRSA colonization surveillance program. It is not intended to diagnose MRSA infection nor to guide or monitor treatment for MRSA infections. Performed at [facility], [place] [address] [place] [address] [place], [place] [address]          Radiology Studies: No results found.  Scheduled Meds: .  stroke: mapping our early stages of recovery book  1 each Does not apply Once  . amLODipine  10 mg Oral Daily  . divalproex  125 mg Oral BID  . divalproex  500 mg Oral BID  . levETIRAcetam  1,000 mg Oral Daily  . levETIRAcetam  1,500 mg Oral  QHS  . levothyroxine  100 mcg Oral QAC breakfast  . nystatin  1 g Topical BID  . nystatin cream  1 application Topical BID  . olopatadine  1 drop Both Eyes BID  . pantoprazole  40 mg Oral QAC supper  . polyethylene glycol  17 g Oral Daily  . potassium chloride  40 mEq Oral Once  . potassium chloride  40 mEq Oral Once   Continuous Infusions: . dextrose 125 mL/hr at 04/27/17 0942  . meropenem (MERREM) IV Stopped (04/27/17 1014)     LOS: 6 days     Roxan Hockey, MD [facility] Pager [phone]  If 7PM-7AM, please contact night-coverage [email or web address] Password [facility] 04/27/2017, 5:36 PM

## 2017-04-27 NOTE — Clinical Social Work Note (Signed)
Clinical Social Work Assessment  Patient Details  Name: [name] MRN: [id number] Date of Birth: 1956/03/05  Date of referral:  04/27/17               Reason for consult:  Facility Placement                Permission sought to share information with:  Facility Sport and exercise psychologist, Family Supports Permission granted to share information::  Yes, Verbal Permission Granted  Name::     Marilynne Halsted  Agency::  [facility]  Relationship::  Daughter   Contact Information:  [phone]  Housing/Transportation Living arrangements for the past 2 months:  [place] of Information:  Patient Patient Interpreter Needed:  None Criminal Activity/Legal Involvement Pertinent to Current Situation/Hospitalization:  No - Comment as needed Significant Relationships:  Adult Children, Siblings Lives with:  Facility Resident Do you feel safe going back to the place where you live?  Yes Need for family participation in patient care:  No (Coment)  Care giving concerns:  Patient from [facility]. Patient reported that she has been at [facility] for the past 9 years and that it is going okay. Patient reported that she plans to return to [facility].    Social Worker assessment / plan:  CSW spoke with patient at bedside regarding discharge planning. Patient verbalized plan to discharge back to SNF. Patient reported that she gets often visits from her sister and daughter. CSW agreed to follow up with SNF.  CSW contacted [facility] and spoke with staff member Larene Beach. Staff confirmed patient's ability to return.  CSW completed FL2 and will continue to follow and assist with discharge planning.  Employment status:  Disabled (Comment on whether or not currently receiving Disability) Insurance information:  Managed Medicare PT Recommendations:  Not assessed at this time Information / Referral to community resources:  Child psychotherapist from [facility])  Patient/Family's Response to care:  Patient appreciative of CSW assistance with discharge planning.  Patient/Family's Understanding of and Emotional Response to Diagnosis, Current Treatment, and Prognosis:  Patient presented pleasant and remained engaged throughout the duration of the assessment. Patient verbalized understanding of diagnosis and current treatment. 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   Emotional Assessment Appearance:  Appears stated age Attitude/Demeanor/Rapport:  Engaged Affect (typically observed):  Pleasant Orientation:  Oriented to Self, Oriented to Place, Oriented to  Time, Oriented to Situation Alcohol / Substance use:  Not Applicable Psych involvement (Current and /or in the community):  No (Comment)  Discharge Needs  Concerns to be addressed:  Care Coordination Readmission within the last 30 days:  No Current discharge risk:  Dependent with Mobility Barriers to Discharge:  No Barriers Identified   Burnis Medin, LCSW 04/27/2017, 1:56 PM

## 2017-04-27 NOTE — Progress Notes (Signed)
PT Cancellation Note / SCREEN  Patient Details Name: [name] MRN: [id number] DOB: 08-05-1955   Cancelled Treatment:    Reason Eval/Treat Not Completed: PT screened, no needs identified, will sign off.  Pt has been a resident of [facility] for 9 years per her report.  She is bedbound and facility uses lift for OOB.  Pt does not appear appropriate for acute skilled PT services.  PT to sign off.   Louden Houseworth,KATHrine E 04/27/2017, 10:06 AM Carmelia Bake, PT, DPT 04/27/2017 Pager: [phone]

## 2017-04-27 NOTE — [place] FL2 (Signed)
[place] MEDICAID FL2 LEVEL OF CARE SCREENING TOOL     IDENTIFICATION  Patient Name: [name] Birthdate: 1955/08/12 Sex: female Admission Date (Current Location): 04/21/2017  [facility] and [place] Number:  Herbalist and Address:  [facility],  [place] [address] [place]      Provider Number: [id number]  Attending Physician Name and Address:  Roxan Hockey, MD  Relative Name and Phone Number:       Current Level of Care: Hospital Recommended Level of Care: [place] Prior Approval Number:    Date Approved/Denied:   PASRR Number:    Discharge Plan: SNF    Current Diagnoses: Patient Active Problem List   Diagnosis Date Noted  . Severe sepsis with septic shock ([place]) 04/23/2017  . AKI (acute kidney injury) ([place]) 04/21/2017  . CVA (cerebral vascular accident) ([place]) 04/21/2017  . HTN (hypertension) 11/21/2012  . Lower urinary tract infectious disease 11/18/2012  . Sepsis ([place]) 11/18/2012  . Thrombocytopenia, unspecified ([place]) 07/28/2012  . Central sleep apnea 07/28/2012  . Hemiparesis affecting left side as late effect of cerebrovascular accident ([place]) 07/28/2012  . Hypothyroidism 07/28/2012  . Hypercapnia 07/28/2012  . Seizure disorder ([place]) 07/28/2012  . Narcotic overdose ([place]) 07/27/2012  . Acute respiratory failure ([place]) 07/27/2012  . Morbid obesity ([place]) 07/27/2012  . History of stroke 07/27/2012  . Chronic pain disorder 07/27/2012    Orientation RESPIRATION BLADDER Height & Weight     Self, Place, Time  O2(CPAP 4 Liters) Continent Weight: 271 lb 6.2 oz (123.1 kg) Height:  5' 1.81" (157 cm)  BEHAVIORAL SYMPTOMS/MOOD NEUROLOGICAL BOWEL NUTRITION STATUS      Incontinent Diet(Heart healthy)  AMBULATORY STATUS COMMUNICATION OF NEEDS Skin   Extensive Assist Verbally Normal                       Personal Care Assistance Level of Assistance  Bathing, Feeding, Dressing Bathing Assistance: Maximum  assistance Feeding assistance: Limited assistance Dressing Assistance: Maximum assistance     Functional Limitations Info             SPECIAL CARE FACTORS FREQUENCY                       Contractures      Additional Factors Info  Code Status, Allergies Code Status Info: Full code  Allergies Info: CODEINE, LACOSAMIDE, MORPHINE, ZOLPIDEM            Current Medications (04/27/2017):  This is the current hospital active medication list Current Facility-Administered Medications  Medication Dose Route Frequency Provider Last Rate Last Dose  .  stroke: mapping our early stages of recovery book 1 each  1 each Does not apply Once Emokpae, Ejiroghene E, MD      . acetaminophen (TYLENOL) tablet 650 mg  650 mg Oral Q4H PRN Heath Lark D, DO   650 mg at 04/27/17 0454   Or  . acetaminophen (TYLENOL) solution 650 mg  650 mg Per Tube Q4H PRN Manuella Ghazi, Pratik D, DO       Or  . acetaminophen (TYLENOL) suppository 650 mg  650 mg Rectal Q4H PRN Heath Lark D, DO   650 mg at 04/23/17 2357  . amLODipine (NORVASC) tablet 10 mg  10 mg Oral Daily Denton Brick, Courage, MD   10 mg at 04/27/17 0944  . dextrose 5 % solution   Intravenous Continuous Edrick Oh, MD 125 mL/hr at 04/27/17 [phone]    .  divalproex (DEPAKOTE) DR tablet 125 mg  125 mg Oral BID Heath Lark D, DO   125 mg at 04/27/17 0946  . divalproex (DEPAKOTE) DR tablet 500 mg  500 mg Oral BID Manuella Ghazi, Pratik D, DO   500 mg at 04/27/17 0945  . hydrALAZINE (APRESOLINE) injection 10 mg  10 mg Intravenous Q4H PRN Roxan Hockey, MD   10 mg at 04/27/17 0454  . labetalol (NORMODYNE,TRANDATE) injection 10 mg  10 mg Intravenous Q4H PRN Dara Hoyer, RPH      . levETIRAcetam (KEPPRA) tablet 1,000 mg  1,000 mg Oral Daily Jodie Echevaria, Amrit, MD   1,000 mg at 04/27/17 0945  . levETIRAcetam (KEPPRA) tablet 1,500 mg  1,500 mg Oral QHS Shah, Pratik D, DO   1,500 mg at 04/26/17 2226  . levothyroxine (SYNTHROID, LEVOTHROID) tablet 100 mcg  100 mcg Oral  QAC breakfast Heath Lark D, DO   100 mcg at 04/27/17 0945  . meropenem (MERREM) 1 g in sodium chloride 0.9 % 100 mL IVPB  1 g Intravenous Q8H Emokpae, Courage, MD 200 mL/hr at 04/27/17 0943 1 g at 04/27/17 0943  . nystatin (MYCOSTATIN/NYSTOP) topical powder 1 g  1 g Topical BID Manuella Ghazi, Pratik D, DO   1 g at 04/27/17 0946  . nystatin cream (MYCOSTATIN) 1 application  1 application Topical BID Heath Lark D, DO   1 application at 00/76/22 0946  . olopatadine (PATANOL) 0.1 % ophthalmic solution 1 drop  1 drop Both Eyes BID Manuella Ghazi, Pratik D, DO   1 drop at 04/27/17 0946  . pantoprazole (PROTONIX) EC tablet 40 mg  40 mg Oral QAC supper Denton Brick, Courage, MD   40 mg at 04/26/17 1701  . polyethylene glycol (MIRALAX / GLYCOLAX) packet 17 g  17 g Oral Daily Manuella Ghazi, Pratik D, DO   17 g at 04/25/17 6333     Discharge Medications: Please see discharge summary for a list of discharge medications.  Relevant Imaging Results:  Relevant Lab Results:   Additional Information SS#: [id number]  [place] Anna, LCSW

## 2017-04-28 LAB — BASIC METABOLIC PANEL
Anion gap: 11 (ref 5–15)
BUN: 10 mg/dL (ref 6–20)
CHLORIDE: 112 mmol/L — AB (ref 101–111)
CO2: 20 mmol/L — ABNORMAL LOW (ref 22–32)
CREATININE: 0.66 mg/dL (ref 0.44–1.00)
Calcium: 7.9 mg/dL — ABNORMAL LOW (ref 8.9–10.3)
GFR calc Af Amer: >60 mL/min (ref >60)
GFR calc non Af Amer: >60 mL/min (ref >60)
Glucose, Bld: 102 mg/dL — ABNORMAL HIGH (ref 65–99)
Potassium: 4.4 mmol/L (ref 3.5–5.1)
SODIUM: 143 mmol/L (ref 135–145)

## 2017-04-28 MED ORDER — SODIUM CHLORIDE 0.9 % IV SOLN: 1.0000 g | INTRAVENOUS | 0 refills | Status: AC

## 2017-04-28 MED ORDER — OXYCODONE HCL 5 MG PO TABS: 5.0000 mg | ORAL_TABLET | Freq: Three times a day (TID) | ORAL | 0 refills | Status: DC | PRN

## 2017-04-28 MED ORDER — SODIUM CHLORIDE 0.9 % IV SOLN
1.0000 g | Freq: Once | INTRAVENOUS | Status: AC
  Administered 2017-04-28: 1 g via INTRAVENOUS
  Filled 2017-04-28: qty 1

## 2017-04-28 MED ORDER — AMLODIPINE BESYLATE 10 MG PO TABS: 10.0000 mg | ORAL_TABLET | Freq: Every day | ORAL | 3 refills | Status: DC

## 2017-04-28 MED ORDER — ACETAMINOPHEN 325 MG PO TABS: 650.0000 mg | ORAL_TABLET | ORAL | 2 refills | Status: AC | PRN

## 2017-04-28 NOTE — Progress Notes (Signed)
Patient to DC to SNF today via [facility] transport and with left arm PIV intact for pending IV ABX administration, telemetry box removed

## 2017-04-28 NOTE — Care Management Note (Signed)
Case Management Note  Patient Details  Name: [name] MRN: [id number] Date of Birth: 1955/04/02  Subjective/Objective:                    Action/Plan:d/c SNF   Expected Discharge Date:  04/27/17               Expected Discharge Plan:  Skilled Nursing Facility  In-House Referral:  Clinical Social Work  Discharge planning Services  CM Consult  Post Acute Care Choice:    Choice offered to:     DME Arranged:    DME Agency:     HH Arranged:    [place] Agency:     Status of Service:  Completed, signed off  If discussed at [employer], dates discussed:    Additional Comments:  [name], RN 04/28/2017, 1:34 PM

## 2017-04-28 NOTE — Progress Notes (Signed)
   04/28/17 1539  Clinical Encounter Type  Visited With Patient  Visit Type Initial  Spiritual Encounters  Spiritual Needs Prayer   While rounding on the floor the patient's door was open and I asked to come in.  She shared she was being discharged today back to SNF in [place].  Indicated her daughter visits her 3 times a week.  She was very appreciative of the visit and was anxious for prayers.  We prayed together.  Will follow and support as needed. Chaplain Katherene Ponto

## 2017-04-28 NOTE — Discharge Instructions (Signed)
1)[employer] iv 1 gm daily from 04/29/17 thru 05/01/17 2)CBC and BMP on 05/03/17--- fax to [facility] Physician or PCP Removal of the stent: It is very important to follow-up with Dr. Diona Fanti to remove the stent / stone in 1 to 2 weeks    Ureteral Stent Implantation, Care After Refer to this sheet in the next few weeks. These instructions provide you with information about caring for yourself after your procedure. Your health care provider may also give you more specific instructions. Your treatment has been planned according to current medical practices, but problems sometimes occur. Call your health care provider if you have any problems or questions after your procedure.  Removal of the stent: It is very important to follow-up with Dr. Diona Fanti to remove the stent / stone.   What can I expect after the procedure? After the procedure, it is common to have:  Nausea.  Mild pain when you urinate. You may feel this pain in your lower back or lower abdomen. Pain should stop within a few minutes after you urinate. This may last for up to 1 week.  A small amount of blood in your urine for several days.  Follow these instructions at home:  Medicines  Take over-the-counter and prescription medicines only as told by your health care provider.  If you were prescribed an antibiotic medicine, take it as told by your health care provider. Do not stop taking the antibiotic even if you start to feel better.  Do not drive for 24 hours if you received a sedative.  Do not drive or operate heavy machinery while taking prescription pain medicines. Activity  Return to your normal activities as told by your health care provider. Ask your health care provider what activities are safe for you.  Do not lift anything that is heavier than 10 lb (4.5 kg). Follow this limit for 1 week after your procedure, or for as long as told by your health care provider. General instructions  Watch for any blood in your urine. Call  your health care provider if the amount of blood in your urine increases.  If you have a catheter: ? Follow instructions from your health care provider about taking care of your catheter and collection bag. ? Do not take baths, swim, or use a hot tub until your health care provider approves.  Drink enough fluid to keep your urine clear or pale yellow.  Keep all follow-up visits as told by your health care provider. This is important. Contact a health care provider if:  You have pain that gets worse or does not get better with medicine, especially pain when you urinate.  You have difficulty urinating.  You feel nauseous or you vomit repeatedly during a period of more than 2 days after the procedure. Get help right away if:  Your urine is dark red or has blood clots in it.  You are leaking urine (have incontinence).  The end of the stent comes out of your urethra.  You cannot urinate.  You have sudden, sharp, or severe pain in your abdomen or lower back.  You have a fever. This information is not intended to replace advice given to you by your health care provider. Make sure you discuss any questions you have with your health care provider. Document Released: 10/26/2012 Document Revised: 08/01/2015 Document Reviewed: 09/07/2014 Elsevier Interactive Patient Education  2018 [place] iv 1 gm daily from 04/29/17 thru 05/01/17 2)CBC and BMP on 05/03/17--- fax to [facility]  Physician or PCP Removal of the stent: It is very important to follow-up with Dr. Diona Fanti to remove the stent / stone in 1 to 2 weeks

## 2017-04-28 NOTE — Discharge Summary (Addendum)
[name], is a 62 y.o. female  DOB 12-03-1955  MRN [id number].  Admission date:  04/21/2017  Admitting Physician  Rita Aloe, MD  Discharge Date:  04/28/2017   Primary MD  Rita Corrigan, MD  Recommendations for primary care physician for things to follow:  1)InVanz iv 1 gm daily from 04/29/17 thru 05/01/17 2)CBC and BMP on 05/03/17--- fax to [name] or PCP 3)Removal of the stent: It is very important to follow-up with Dr. Diona [name] to remove the stent / stone in 1 to 2 weeks   May Discharged to skilled nursing facility with peripheral IV/ Saline Lock on 04/27/17  Please discontinue saline lock/peripheral IV at SNF on 05/01/2017 after patient has completed IV Invanz  Admission Diagnosis  Slurred speech [R47.81] AKI (acute kidney injury) ([name]) [N17.9]   Discharge Diagnosis  Slurred speech [R47.81] AKI (acute kidney injury) ([name]) [N17.9]    Principal Problem:   Severe sepsis with septic shock ([name]) Active Problems:   Morbid obesity ([place])   History of stroke   Thrombocytopenia, unspecified ([name])   Central sleep apnea   Hemiparesis affecting left side as late effect of cerebrovascular accident [name])   Hypothyroidism   Seizure disorder ([place])   Lower urinary tract infectious disease   HTN (hypertension)   AKI (acute kidney injury) ([place])   CVA (cerebral vascular accident) [name])      Past Medical History:  Diagnosis Date  . Acute respiratory failure ([name]) 07/27/2012  . Arthritis    OSTEO LEFT LEG  . Chronic pain   . CVA (cerebral vascular accident) ([name])    left sided hemiparesis  . Depression   . Diabetes mellitus without complication ([name])   . Hemiparesis affecting left side as late effect of cerebrovascular accident ([name])   . Hypertension   . Metabolic encephalopathy 15/40/0867  . Narcotic overdose ([name]) 07/27/2012  . Respiratory failure ([name]) 05/06/10  . Seizure  disorder ([name])   . Sleep apnea 04/2010   on CPAP, "severe central sleep apnea"  . Spasticity    chronic  . Thrombocytopenia ([name])    related to depakote  . Unspecified hypothyroidism   . 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 UTI (lower urinary tract infection) 05/06/10    Past Surgical History:  Procedure Laterality Date  . CEREBRAL ANEURYSM REPAIR  1999  . CYSTOSCOPY W/ URETERAL STENT PLACEMENT Bilateral 04/22/2017   Procedure: CYSTOSCOPY WITH BILATERAL  RETROGRADE PYELOGRAM, RIGHT URETERAL STENT PLACEMENT;  Surgeon: Rita Aloe, MD;  Location: [facility];  Service: Urology;  Laterality: Bilateral;  . INTRATHECAL PUMP IMPLANTATION  2010   [employer]:  fentanyl, baclofen changed to morphine and baclofen on 07/2012       HPI  from the history and physical done on the day of admission:    Patient coming from: SNF  Chief Complaint: Slurred speech  HPI: [name] is a 62 y.o. female with medical history significant for severe nondominant hemisphere stroke with associated dense left hemi-paresis, neglect, and associated spasticity, seizure disorder, sleep apnea,  hypertension, hypothyroidism, and obesity who was brought to the emergency department for further evaluation by EMS personnel.  She apparently was last known well at 830 this morning during which time she was eating a banana.  She was checked on again at approximately 9:30 AM of slurred speech of acute onset. She denies any headache, visual changes, numbness, or Rita weakness She denies any chest pain, shortness of breath She denies any nausea, vomiting, or abdominal pain   ED Course: She has undergone imaging studies to include CTA of the head and neck as well as chest x-ray and perfusion study with no significant findings noted that are acute in nature.  Tele-neurology was consulted with recommendations not to give TPA at this time and to consider possible seizure activity with initiation of Keppra bolus and further evaluation  with EEG as well as inpatient neurology consultation.  Blood pressures have been difficult to measure and have remained quite soft for which IV boluses are currently underway with some improvement noted.  Laboratory data demonstrates creatinine of 2, platelets of 58,000, and WBC of 12,600     [name] Course:    Brief Narrative: Patient is a 62 year old female with past medical history of stroke, severe left-sided hemiparesis as well as neglect, seizure disorder, sleep apnea, hypertension, hypothyroidism, morbid obesity, chronic nephrolithiasis who resides at [name] (bedbound) was transferred to [name] from [name] on 04/22/17 (admitted to [name] on 04/21/17)  with concerns about altered mental status and sepsis. She was found to have an obstructing right calculus , on 04/22/17 underwent cystoscopy with right ureteral stent placement for urosepsis.  Urine culture with ESBL E. coli, blood cultures also with E. coli   Plan:- 1)ESBL E Coli Severe sepsis with septic shock: Clinically much improved,   afebrile,  hypotension is resolved,  treated with meropenem from 04/23/17 thru 04/28/17 for E. coli sepsis and bacteremia from urinary source, started in [name] from 04/28/17 thru 05/01/17  2)Altered mental status: Multifactorial, cognitively much better, per sister she is back to baseline, suspect AMS was due to toxic metabolic encephalopathy mostly due to combination of severe sepsis and uremia from renal dysfunction , CT head without acute findings , passed a swallow eval at bedside on 04/24/2017, eating well   3)Acute kidney injury:  resolved, creatinine is down to 0.6 from 2.69,  most likely due to hypotension in the setting of septic shock, compounded by recent Contrast study,  urine output is better,   nephrology consult appreciated  4)Nephrolithiasis/pyelonephritis: Most likely the source of sepsis.  on 04/22/17 underwent cystoscopy with right ureteral stent placement for urosepsis.   Plan is to  remove the stent and stone  as an outpatient . CT imaging showed:Right hydronephrosis, renal pelvic stone, bilateral abnormal delayed nephrograms compatible with acute renal injury, heterogeneity of the left kidney parenchyma possibly consistent with pyelonephritis, Removal of the stent: It is very important to follow-up with Dr. Diona [name] to remove the stent / stone in 1 to 2 weeks   5)H/o Stroke: Has history of prior right-sided MCA stroke with dense left hemiparesis and neglect.  She is bedbound.  Patient also has residual facial droop.  Clinical exam and repeat CTA without Rita acute findings   6)Seizure disorder:   EEG did not show any seizure activity, no evidence of Rita seizures, continue Keppra and Depakote  7)Thrombocytopenia:Chronic.  Most likely exacerbated by sepsis now .  We will continue to monitor the counts.  8)Hypothyroidism: Continue home Synthyroid.  9)Sleep apnea:  Continue CPAP/BiPAP at night.   Discharge Condition: stable  Follow UP- 1 Removal of the stent: It is very important to follow-up with Dr. Diona [name] to remove the stent / stone in 1 to 2 weeks    Contact information for follow-up providers    Franchot Gallo, MD In 2 weeks.   Specialty:  Urology Contact information: [address] [place] [address] [phone]            Contact information for after-discharge care    Destination    [facility] .   Service:  Skilled Nursing Contact information: [place] [phone]                  Consults obtained - Urology/Nephrology  Diet and Activity recommendation:  As advised  Discharge Instructions  -May Discharged to skilled nursing facility with peripheral IV/ Saline Lock on 04/27/17  Please discontinue saline lock/peripheral IV at SNF on 05/01/2017 after patient has completed IV Invanz  Discharge Instructions    Call MD for:  difficulty breathing, headache or visual  disturbances   Complete by:  As directed    Call MD for:  persistant dizziness or light-headedness   Complete by:  As directed    Call MD for:  persistant nausea and vomiting   Complete by:  As directed    Call MD for:  severe uncontrolled pain   Complete by:  As directed    Call MD for:  temperature >100.4   Complete by:  As directed    Diet - low sodium heart healthy   Complete by:  As directed    Discharge instructions   Complete by:  As directed    1)InVanz iv 1 gm daily from 04/29/17 thru 05/01/17 2)CBC and BMP on 05/03/17--- fax to [name] [place] [facility] or PCP 3)Removal of the stent: It is very important to follow-up with Dr. Diona [name] to remove the stent / stone.  Removal of the stent: It is very important to follow-up with Dr. Diona [name] to remove the stent / stone in 1 to 2 weeks   Increase activity slowly   Complete by:  As directed      May Discharged to skilled nursing facility with peripheral IV/ Saline Lock on 04/27/17  Please discontinue saline lock/peripheral IV at SNF on 05/01/2017 after patient has completed IV Invanz   Discharge Medications     Allergies as of 04/28/2017      Reactions   Codeine Other (See Comments)   unknown   Lacosamide Other (See Comments)   Mood changes/agitation No rx noted    Morphine Other (See Comments)   No rx noted    Zolpidem       Medication List    TAKE these medications   acetaminophen 160 MG/5ML suspension Commonly known as:  TYLENOL Take 960 mg by mouth every 6 (six) hours as needed for moderate pain or fever. What changed:  Another medication with the same name was added. Make sure you understand how and when to take each.   acetaminophen 325 MG tablet Commonly known as:  TYLENOL Take 2 tablets (650 mg total) by mouth every 4 (four) hours as needed for mild pain (or temp > 37.5 C (99.5 F)). What changed:  You were already taking a medication with the same name, and this prescription was added. Make sure you understand how and  when to take each.   amLODipine 10 MG tablet Commonly known  as:  NORVASC Take 1 tablet (10 mg total) by mouth daily. Start taking on:  04/29/2017 What changed:    medication strength  how much to take   aspirin EC 81 MG tablet Take 81 mg by mouth daily.   cloNIDine 0.1 MG tablet Commonly known as:  CATAPRES Take 0.1 mg by mouth every 6 (six) hours as needed (for SBP greater than 160).   divalproex 125 MG DR tablet Commonly known as:  DEPAKOTE Take 125 mg by mouth 2 (two) times daily.   divalproex 500 MG DR tablet Commonly known as:  DEPAKOTE Take 500 mg by mouth 2 (two) times daily.   DULoxetine 20 MG capsule Commonly known as:  CYMBALTA Take 20 mg by mouth daily.   DULoxetine 60 MG capsule Commonly known as:  CYMBALTA Take 60 mg by mouth daily.   ertapenem 1 g in sodium chloride 0.9 % 100 mL Inject 1 g into the vein daily for 3 days. First dose 04/29/17 and Last dose 05/01/17   labetalol 200 MG tablet Commonly known as:  NORMODYNE Take 200 mg by mouth 2 (two) times daily.   lactulose (encephalopathy) 10 GM/15ML Soln Commonly known as:  CHRONULAC Take 20 g by mouth daily as needed.   levETIRAcetam 1000 MG tablet Commonly known as:  KEPPRA Take 1,000 mg by mouth daily.   levETIRAcetam 500 MG tablet Commonly known as:  KEPPRA Take 1,500 mg by mouth at bedtime.   levothyroxine 100 MCG tablet Commonly known as:  SYNTHROID, LEVOTHROID Take 100 mcg by mouth daily before breakfast.   lidocaine 5 % Commonly known as:  LIDODERM Place 1 patch onto the skin daily. Remove & Discard patch within 12 hours or as directed by MD   loratadine 10 MG tablet Commonly known as:  CLARITIN Take 10 mg by mouth daily.   losartan 50 MG tablet Commonly known as:  COZAAR Take 50 mg by mouth at bedtime.   Menthol (Topical Analgesic) 5 % Gel Apply 1 application topically 4 (four) times daily as needed.   multivitamin with minerals Tabs tablet Take 1 tablet by mouth daily.     nystatin powder Generic drug:  nystatin Apply 1 g topically 2 (two) times daily.   nystatin cream Commonly known as:  MYCOSTATIN Apply 1 application topically 2 (two) times daily.   omeprazole 20 MG capsule Commonly known as:  PRILOSEC Take 20 mg by mouth daily.   oxyCODONE 5 MG immediate release tablet Commonly known as:  Oxy IR/ROXICODONE Take 1 tablet (5 mg total) by mouth every 8 (eight) hours as needed. What changed:  reasons to take this   PATADAY 0.2 % Soln Generic drug:  Olopatadine HCl Place 1 drop into both eyes at bedtime.   polyethylene glycol packet Commonly known as:  MIRALAX / GLYCOLAX Take 17 g by mouth daily.   rosuvastatin 5 MG tablet Commonly known as:  CRESTOR Take 5 mg by mouth at bedtime.   SYSTANE 0.4-0.3 % Soln Generic drug:  Polyethyl Glycol-Propyl Glycol Place 1 drop into both eyes 3 (three) times daily as needed.   traZODone 50 MG tablet Commonly known as:  DESYREL Take 50 mg by mouth at bedtime.   Vitamin D (Ergocalciferol) 50000 units Caps capsule Commonly known as:  DRISDOL Take 50,000 Units by mouth every 30 (thirty) days.       Major procedures and Radiology Reports - PLEASE review detailed and final reports for all details, in brief -    Ct Abdomen  Pelvis Wo Contrast  Result Date: 04/22/2017 CLINICAL DATA:  Recurrent urinary tract infection. Diabetes. Hydronephrosis seen on recent ultrasound. EXAM: CT ABDOMEN AND PELVIS WITHOUT CONTRAST TECHNIQUE: Multidetector CT imaging of the abdomen and pelvis was performed following the standard protocol without IV contrast. COMPARISON:  04/22/2017 FINDINGS: Despite efforts by the technologist and patient, motion artifact is present on today's exam and could not be eliminated. This reduces exam sensitivity and specificity. Lower chest: Mild cardiomegaly with small bilateral pleural effusions and passive atelectasis in the lung bases. Hepatobiliary: Dependent density in the gallbladder could be  sludge or gallstones. Otherwise unremarkable. Pancreas: Unremarkable Spleen: Unremarkable Adrenals/Urinary Tract: The adrenal glands appear normal. There is abnormal bilateral delayed nephrogram with contrast medium in both collecting systems, ureters, and the mostly empty urinary bladder (Foley catheter in place). There is right hydronephrosis along with asymmetric abnormal stranding around the right renal collecting system. Patchy variable contrast in the left kidney noted. With the use of bone window in, the contrast in the renal collecting systems can be somewhat differentiated from the calculi. There is a central left renal collecting system calculus measuring 1.3 by 0.9 cm on image 40/2. Multiple nonobstructive left renal collecting system calculi are shown for example on image 33/2 using bone windows. Multiple clustered calcifications in the left kidney lower pole are also present. Particularly on the left side. Stomach/Bowel: There is an unusual appearance of the rectum, with marked wall thickening in the lower rectum circumferentially, and a patulous anus. Vascular/Lymphatic: Scattered small retroperitoneal lymph nodes are present. These may be reactive. Aortoiliac atherosclerotic vascular disease. Reproductive: Multiple uterine fibroids, subserosal and intramural, similar contour to the prior exam. Other: No supplemental non-categorized findings. Musculoskeletal: High density along the margins of the inferior thecal sac on image 64/6, possibly calcification or contrast, less likely blood products. An intrathecal pump is present. IMPRESSION: 1. Mild right hydronephrosis related to a 1.3 by 0.9 cm right collecting system stone which is likely mildly obstructing the right UPJ. 2. Abnormal delayed nephrograms compatible with acute renal injury. This contrast was administered yesterday and is still in the kidneys. There is also some excreted contrast in the collecting systems and ureters. 3. Greater degree of  heterogeneity in the left kidney than the right with regard to the contrast distribution and appearance, along with left perirenal stranding somewhat greater than the right. I cannot exclude pyelonephritis given this appearance, but the appearance is not considered specific given the difference in degree of obstruction and the overall unusual situation of renal contrast 1 day after administration. 4. Mild cardiomegaly with bilateral pleural effusions. 5. Abnormal appearance of the rectum with considerable distal rectal wall thickening and patulous anus. 6. There is high density in the thecal sac inferiorly, probably calcification. 7. Other imaging findings of potential clinical significance: Uterine fibroids. Possible gallstones. Aortic Atherosclerosis (ICD10-I70.0). Electronically Signed   By: Van Clines M.D.   On: 04/22/2017 17:06   Ct Angio Head W Or Wo Contrast  Result Date: 04/21/2017 CLINICAL DATA:  Altered mental status.  Abnormal speech. EXAM: CT ANGIOGRAPHY HEAD AND NECK CT PERFUSION BRAIN TECHNIQUE: Multidetector CT imaging of the head and neck was performed using the standard protocol during bolus administration of intravenous contrast. Multiplanar CT image reconstructions and MIPs were obtained to evaluate the vascular anatomy. Carotid stenosis measurements (when applicable) are obtained utilizing NASCET criteria, using the distal internal carotid diameter as the denominator. Multiphase CT imaging of the brain was performed following IV bolus contrast injection. Subsequent parametric perfusion  maps were calculated using RAPID software. CONTRAST:  39mL ISOVUE-370 IOPAMIDOL (ISOVUE-370) INJECTION 76% COMPARISON:  Noncontrast head CT today. No prior angiographic imaging. FINDINGS: CTA NECK FINDINGS Aortic arch: Standard 3 vessel aortic arch with mild atherosclerotic plaque. Widely patent arch vessel origins. Right carotid system: Patent without evidence of stenosis or dissection. Left carotid  system: Patent without evidence of stenosis or dissection. Vertebral arteries: Patent and codominant without evidence of dissection or hemodynamically significant stenosis. Possible mild left vertebral artery origin stenosis, with motion artifact limiting assessment. Skeleton: Right anterior pterional craniotomy. Moderate cervical disc and facet degeneration. Other neck: No mass or enlarged lymph nodes. Upper chest: Motion artifact in the lung apices with posterior right upper lobe opacity likely reflecting atelectasis. Review of the MIP images confirms the above findings CTA HEAD FINDINGS Anterior circulation: The internal carotid arteries are widely patent from skull base to carotid termini. An aneurysm clip is present in the anterior communicating region with associated streak artifact. The ACAs are patent, with the right A1 segment being hypoplastic. No recurrent/residual aneurysm is identified. The MCAs are patent without evidence of proximal branch occlusion or significant proximal stenosis. There is diffuse asymmetric attenuation of distal branch vessels throughout the right MCA territory. Posterior circulation: The intracranial vertebral arteries are widely patent to the basilar. Dominant right PICA and left AICA. Patent SCAs. The basilar artery is widely patent. There is a patent right posterior communicating artery, and the right P1 segment appears hypoplastic. The PCAs are patent without evidence of significant stenosis. No aneurysm. Venous sinuses: Patent. Anatomic variants: Hypoplastic right A1 and right P1 segments. Delayed phase: No abnormal enhancement. Review of the MIP images confirms the above findings CT Brain Perfusion Findings: There is prominent motion artifact in the mid to latter part of the acquisition. CBF (<30%) Volume: 0 mL Perfusion (Tmax>6.0s) volume: 136 mL, however this includes portions of the posterior fossa and posterior left cerebral hemisphere which are favored to be  artifactual. The majority of the prolonged transit involves the right cerebral hemisphere peripheral to the large area of chronic encephalomalacia. Infarction Location: None based on a threshold of CBF <30% of the contralateral side, although there is focal less severely reduced CBF (<38%) in the high right frontoparietal region IMPRESSION: 1. No emergent large vessel occlusion. 2. 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 Diffuse attenuation of distal right MCA branch vessels with prolonged transit throughout the right hemisphere peripheral to the encephalomalacia, likely chronic. No definite acute core infarct based on motion degraded perfusion imaging. 3. No significant proximal intracranial arterial stenosis. 4. Widely patent cervical carotid arteries. 5. 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 These results were called by telephone at the time of interpretation on 04/21/2017 at 12:36 pm to Dr. Thurnell Garbe, who verbally acknowledged these results. Electronically Signed   By: Logan Bores M.D.   On: 04/21/2017 13:09   Ct Head Wo Contrast  Result Date: 04/23/2017 CLINICAL DATA:  Altered level consciousness EXAM: CT HEAD WITHOUT CONTRAST TECHNIQUE: Contiguous axial images were obtained from the base of the skull through the vertex without intravenous contrast. COMPARISON:  None. FINDINGS: Brain: No evidence of acute infarction, hemorrhage, extra-axial collection, ventriculomegaly, or mass effect. Old, large right MCA territory infarct with encephalomalacia. Generalized cerebral atrophy. Periventricular white matter low attenuation likely secondary to microangiopathy. Vascular: Cerebrovascular atherosclerotic calcifications are noted. 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 Skull: Negative for fracture or focal lesion. Sinuses/Orbits:  Visualized portions of the orbits are unremarkable. Visualized portions of the  paranasal sinuses and mastoid air cells are unremarkable. Other: None. IMPRESSION: 1. No acute intracranial pathology. 2. Old right MCA territory infarct with encephalomalacia. Electronically Signed   By: Kathreen Devoid   On: 04/23/2017 13:16   Ct Angio Neck W Or Wo Contrast  Result Date: 04/21/2017 CLINICAL DATA:  Altered mental status.  Abnormal speech. EXAM: CT ANGIOGRAPHY HEAD AND NECK CT PERFUSION BRAIN TECHNIQUE: Multidetector CT imaging of the head and neck was performed using the standard protocol during bolus administration of intravenous contrast. Multiplanar CT image reconstructions and MIPs were obtained to evaluate the vascular anatomy. Carotid stenosis measurements (when applicable) are obtained utilizing NASCET criteria, using the distal internal carotid diameter as the denominator. Multiphase CT imaging of the brain was performed following IV bolus contrast injection. Subsequent parametric perfusion maps were calculated using RAPID software. CONTRAST:  70mL ISOVUE-370 IOPAMIDOL (ISOVUE-370) INJECTION 76% COMPARISON:  Noncontrast head CT today. No prior angiographic imaging. FINDINGS: CTA NECK FINDINGS Aortic arch: Standard 3 vessel aortic arch with mild atherosclerotic plaque. Widely patent arch vessel origins. Right carotid system: Patent without evidence of stenosis or dissection. Left carotid system: Patent without evidence of stenosis or dissection. Vertebral arteries: Patent and codominant without evidence of dissection or hemodynamically significant stenosis. Possible mild left vertebral artery origin stenosis, with motion artifact limiting assessment. Skeleton: Right anterior pterional craniotomy. Moderate cervical disc and facet degeneration. Other neck: No mass or enlarged lymph nodes. Upper chest: Motion artifact in the lung apices with posterior right upper lobe opacity likely reflecting atelectasis. Review of the MIP images confirms the above findings CTA HEAD FINDINGS Anterior  circulation: The internal carotid arteries are widely patent from skull base to carotid termini. An aneurysm clip is present in the anterior communicating region with associated streak artifact. The ACAs are patent, with the right A1 segment being hypoplastic. No recurrent/residual aneurysm is identified. The MCAs are patent without evidence of proximal branch occlusion or significant proximal stenosis. There is diffuse asymmetric attenuation of distal branch vessels throughout the right MCA territory. Posterior circulation: The intracranial vertebral arteries are widely patent to the basilar. Dominant right PICA and left AICA. Patent SCAs. The basilar artery is widely patent. There is a patent right posterior communicating artery, and the right P1 segment appears hypoplastic. The PCAs are patent without evidence of significant stenosis. No aneurysm. Venous sinuses: Patent. Anatomic variants: Hypoplastic right A1 and right P1 segments. Delayed phase: No abnormal enhancement. Review of the MIP images confirms the above findings CT Brain Perfusion Findings: There is prominent motion artifact in the mid to latter part of the acquisition. CBF (<30%) Volume: 0 mL Perfusion (Tmax>6.0s) volume: 136 mL, however this includes portions of the posterior fossa and posterior left cerebral hemisphere which are favored to be artifactual. The majority of the prolonged transit involves the right cerebral hemisphere peripheral to the large area of chronic encephalomalacia. Infarction Location: None based on a threshold of CBF <30% of the contralateral side, although there is focal less severely reduced CBF (<38%) in the high right frontoparietal region IMPRESSION: 1. No emergent large vessel occlusion. 2. 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 Diffuse attenuation of distal right MCA branch vessels with prolonged transit throughout the right hemisphere  peripheral to the encephalomalacia, likely chronic. No definite acute core infarct based on motion degraded perfusion imaging. 3. No significant proximal intracranial arterial stenosis. 4. Widely  patent cervical carotid arteries. 5. 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 These results were called by telephone at the time of interpretation on 04/21/2017 at 12:36 pm to Dr. Thurnell Garbe, who verbally acknowledged these results. Electronically Signed   By: Logan Bores M.D.   On: 04/21/2017 13:09   US Renal  Result Date: 04/22/2017 CLINICAL DATA:  Acute renal injury. EXAM: RENAL / URINARY TRACT ULTRASOUND COMPLETE COMPARISON:  Ultrasound 04/21/2017. FINDINGS: Right Kidney: Length: 10.2 cm. Echogenicity within normal limits. No mass. Mild hydronephrosis. 11 mm renal pelvic stone. Left Kidney: Length: 10.6 cm. Echogenicity within normal limits. No mass or hydronephrosis visualized. Limited evaluation due to overlying bowel gas, patient's body habitus, and positioning difficulty. Bladder: Bladder is nondistended. By history the patient has a Foley catheter. IMPRESSION: 1. 11 mm right renal pelvic stone. Mild hydronephrosis. Stable exam. No change from prior study of 04/21/2017. 2. Limited evaluation of the left kidney due to overlying bowel gas, patient's body habitus, and positioning difficulty. Electronically Signed   By: Marcello Moores  Register   On: 04/22/2017 10:24   US Renal  Result Date: 04/21/2017 CLINICAL DATA:  Acute kidney injury. EXAM: RENAL / URINARY TRACT ULTRASOUND COMPLETE COMPARISON:  CT scan of the abdomen dated 01/06/2013 FINDINGS: Right Kidney: Length: 10.5 cm. 11 mm stone right renal pelvis. Minimal prominence of the calices. Renal pelvis is not distended. Echogenicity within normal limits. No mass visualized. Left Kidney: Length: 11.7 cm. Small stones in the lower pole. No hydronephrosis. Echogenicity within normal limits. No mass or hydronephrosis visualized. Bladder: Appears normal  for degree of bladder distention. IMPRESSION: 1. Chronic bilateral renal calculi. 2. Minimal dilatation of the calices of the right kidney without dilatation of the renal pelvis or ureter. Electronically Signed   By: Lorriane Shire M.D.   On: 04/21/2017 17:26   Ct Cerebral Perfusion W Contrast  Result Date: 04/21/2017 CLINICAL DATA:  Altered mental status.  Abnormal speech. EXAM: CT ANGIOGRAPHY HEAD AND NECK CT PERFUSION BRAIN TECHNIQUE: Multidetector CT imaging of the head and neck was performed using the standard protocol during bolus administration of intravenous contrast. Multiplanar CT image reconstructions and MIPs were obtained to evaluate the vascular anatomy. Carotid stenosis measurements (when applicable) are obtained utilizing NASCET criteria, using the distal internal carotid diameter as the denominator. Multiphase CT imaging of the brain was performed following IV bolus contrast injection. Subsequent parametric perfusion maps were calculated using RAPID software. CONTRAST:  68mL ISOVUE-370 IOPAMIDOL (ISOVUE-370) INJECTION 76% COMPARISON:  Noncontrast head CT today. No prior angiographic imaging. FINDINGS: CTA NECK FINDINGS Aortic arch: Standard 3 vessel aortic arch with mild atherosclerotic plaque. Widely patent arch vessel origins. Right carotid system: Patent without evidence of stenosis or dissection. Left carotid system: Patent without evidence of stenosis or dissection. Vertebral arteries: Patent and codominant without evidence of dissection or hemodynamically significant stenosis. Possible mild left vertebral artery origin stenosis, with motion artifact limiting assessment. Skeleton: Right anterior pterional craniotomy. Moderate cervical disc and facet degeneration. Other neck: No mass or enlarged lymph nodes. Upper chest: Motion artifact in the lung apices with posterior right upper lobe opacity likely reflecting atelectasis. Review of the MIP images confirms the above findings CTA HEAD  FINDINGS Anterior circulation: The internal carotid arteries are widely patent from skull base to carotid termini. An aneurysm clip is present in the anterior communicating region with associated streak artifact. The ACAs are patent, with the right A1 segment being hypoplastic. No recurrent/residual aneurysm is identified. The MCAs are patent without evidence of  proximal branch occlusion or significant proximal stenosis. There is diffuse asymmetric attenuation of distal branch vessels throughout the right MCA territory. Posterior circulation: The intracranial vertebral arteries are widely patent to the basilar. Dominant right PICA and left AICA. Patent SCAs. The basilar artery is widely patent. There is a patent right posterior communicating artery, and the right P1 segment appears hypoplastic. The PCAs are patent without evidence of significant stenosis. No aneurysm. Venous sinuses: Patent. Anatomic variants: Hypoplastic right A1 and right P1 segments. Delayed phase: No abnormal enhancement. Review of the MIP images confirms the above findings CT Brain Perfusion Findings: There is prominent motion artifact in the mid to latter part of the acquisition. CBF (<30%) Volume: 0 mL Perfusion (Tmax>6.0s) volume: 136 mL, however this includes portions of the posterior fossa and posterior left cerebral hemisphere which are favored to be artifactual. The majority of the prolonged transit involves the right cerebral hemisphere peripheral to the large area of chronic encephalomalacia. Infarction Location: None based on a threshold of CBF <30% of the contralateral side, although there is focal less severely reduced CBF (<38%) in the high right frontoparietal region IMPRESSION: 1. No emergent large vessel occlusion. 2. 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 Diffuse attenuation of distal right MCA branch vessels with prolonged transit throughout the right  hemisphere peripheral to the encephalomalacia, likely chronic. No definite acute core infarct based on motion degraded perfusion imaging. 3. No significant proximal intracranial arterial stenosis. 4. Widely patent cervical carotid arteries. 5. 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 These results were called by telephone at the time of interpretation on 04/21/2017 at 12:36 pm to Dr. Thurnell Garbe, who verbally acknowledged these results. Electronically Signed   By: Logan Bores M.D.   On: 04/21/2017 13:09   Dg Chest Port 1 View  Result Date: 04/21/2017 CLINICAL DATA:  Slurred speech. EXAM: PORTABLE CHEST 1 VIEW COMPARISON:  02/06/2015 FINDINGS: The heart size and pulmonary vascularity are normal and the lungs are clear. No acute bone abnormality. IMPRESSION: No active disease. Electronically Signed   By: Lorriane Shire M.D.   On: 04/21/2017 11:42   Dg C-arm 1-60 Min-no Report  Result Date: 04/22/2017 Fluoroscopy was utilized by the requesting physician.  No radiographic interpretation.   Ct Head Code Stroke Wo Contrast  Result Date: 04/21/2017 CLINICAL DATA:  Code stroke.  Code stroke.  Abnormal speech. EXAM: CT HEAD WITHOUT CONTRAST TECHNIQUE: Contiguous axial images were obtained from the base of the skull through the vertex without intravenous contrast. COMPARISON:  CT head without contrast 09/16/2012 FINDINGS: Brain: Remote right MCA territory encephalomalacia is stable. There is ex vacuo dilation of the right lateral ventricle. Right paraophthalmic aneurysm clip is noted. Left frontal encephalomalacia is stable. Density within the left frontal white matter is stable. 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 No acute infarct is present. Left insular ribbon is normal. No focal cortical abnormality is present. Vascular: Ventricles are of normal size. Ex vacuo dilation of the right lateral ventricle is stable. Skull: Calvarium is intact. No focal lytic  or blastic lesions are present. Sinuses/Orbits: The paranasal sinuses and mastoid air cells are clear. Globes and orbits are unremarkable. ASPECTS [facility] Stroke Program Early CT Score) - Ganglionic level infarction (caudate, lentiform nuclei, internal capsule, insula, M1-M3 cortex): 7/7 - Supraganglionic infarction (M4-M6 cortex): 3/3 Total score (0-10 with 10 being normal): 10/10 IMPRESSION: 1. No acute intracranial abnormality or  significant interval change. 2. 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 4. ASPECTS is 10/10 These results were called by telephone at the time of interpretation on 04/21/2017 at 10:56 am to Dr. Francine Graven , who verbally acknowledged these results. Electronically Signed   By: San Morelle M.D.   On: 04/21/2017 11:02    Micro Results   Recent Results (from the past 240 hour(s))  Culture, Urine     Status: Abnormal   Collection Time: 04/22/17  3:10 AM  Result Value Ref Range Status   Specimen Description   Final    URINE, CATHETERIZED Performed at [name], [address], [place], [place] [address]    Special Requests   Final    NONE Performed at [name], [address], [place], [place] [address]    Culture (A)  Final    >=100,000 COLONIES/mL ESCHERICHIA COLI Confirmed Extended Spectrum Beta-Lactamase Producer (ESBL).  In bloodstream infections from ESBL organisms, carbapenems are preferred over piperacillin/tazobactam. They are shown to have a lower risk of mortality. Performed at [name], [place] [address], [place], [place] [address]    Report Status 04/25/2017 FINAL  Final   Organism ID, Bacteria ESCHERICHIA COLI (A)  Final      Susceptibility   Escherichia coli - MIC*    AMPICILLIN >=32 RESISTANT Resistant     CEFAZOLIN >=64 RESISTANT Resistant     CEFTRIAXONE >=64 RESISTANT Resistant     CIPROFLOXACIN >=4 RESISTANT Resistant     GENTAMICIN <=1  SENSITIVE Sensitive     IMIPENEM <=0.25 SENSITIVE Sensitive     NITROFURANTOIN <=16 SENSITIVE Sensitive     TRIMETH/SULFA >=320 RESISTANT Resistant     AMPICILLIN/SULBACTAM >=32 RESISTANT Resistant     PIP/TAZO 16 SENSITIVE Sensitive     Extended ESBL POSITIVE Resistant     * >=100,000 COLONIES/mL ESCHERICHIA COLI  Culture, blood (routine x 2)     Status: Abnormal   Collection Time: 04/22/17  8:02 AM  Result Value Ref Range Status   Specimen Description   Final    BLOOD RIGHT HAND Performed at [facility], [address], [place], [place] [address]    Special Requests   Final    BOTTLES DRAWN AEROBIC ONLY Blood Culture results may not be optimal due to an inadequate volume of blood received in culture bottles Performed at [name], [address], [place], [place] [address]    Culture  Setup Time   Final    GRAM NEGATIVE RODS AEB ONLY Gram Stain Report Called to,Read Back By and Verified With: HENLEY,J @ 0600 ON 2.15.19 BY BOWMAN,L Performed at [name], [address] [place], [place] [address]    Culture (A)  Final    ESCHERICHIA COLI SUSCEPTIBILITIES PERFORMED ON PREVIOUS CULTURE WITHIN THE LAST 5 DAYS. Performed at [name], [place] [address] [place], [place] [address]    Report Status 04/26/2017 FINAL  Final  Culture, blood (routine x 2)     Status: Abnormal   Collection Time: 04/22/17  9:00 AM  Result Value Ref Range Status   Specimen Description   Final    BLOOD LEFT HAND Performed at [facility], [address] [place], [place] [address]    Special Requests   Final    BOTTLES DRAWN AEROBIC AND ANAEROBIC Blood Culture adequate volume Performed at [name], [address], [place], [place] [address]  Culture  Setup Time   Final    GRAM NEGATIVE RODS IN BOTH AEROBIC AND ANAEROBIC BOTTLES Gram Stain Report Called to,Read Back By and Verified With: HENLEY,J RN @ [facility] ON 12.15.19 BY BOWMAN,L CRITICAL RESULT CALLED TO, READ BACK BY AND VERIFIED WITH:  PHARMD J GRIMSLEY [id number] MLM    Culture (A)  Final    ESCHERICHIA COLI Confirmed Extended Spectrum Beta-Lactamase Producer (ESBL).  In bloodstream infections from ESBL organisms, carbapenems are preferred over piperacillin/tazobactam. They are shown to have a lower risk of mortality. Performed at [name], [place] [address] [place], [place] [address]    Report Status 04/25/2017 FINAL  Final   Organism ID, Bacteria ESCHERICHIA COLI  Final      Susceptibility   Escherichia coli - MIC*    AMPICILLIN >=32 RESISTANT Resistant     CEFAZOLIN >=64 RESISTANT Resistant     CEFEPIME 16 INTERMEDIATE Intermediate     CEFTAZIDIME >=64 RESISTANT Resistant     CEFTRIAXONE >=64 RESISTANT Resistant     CIPROFLOXACIN >=4 RESISTANT Resistant     GENTAMICIN <=1 SENSITIVE Sensitive     IMIPENEM <=0.25 SENSITIVE Sensitive     TRIMETH/SULFA >=320 RESISTANT Resistant     AMPICILLIN/SULBACTAM >=32 RESISTANT Resistant     PIP/TAZO 8 SENSITIVE Sensitive     Extended ESBL POSITIVE Resistant     * ESCHERICHIA COLI  Blood Culture ID Panel (Reflexed)     Status: Abnormal   Collection Time: 04/22/17  9:00 AM  Result Value Ref Range Status   Enterococcus species NOT DETECTED NOT DETECTED Final   Listeria monocytogenes NOT DETECTED NOT DETECTED Final   Staphylococcus species NOT DETECTED NOT DETECTED Final   Staphylococcus aureus NOT DETECTED NOT DETECTED Final   Streptococcus species NOT DETECTED NOT DETECTED Final   Streptococcus agalactiae NOT DETECTED NOT DETECTED Final   Streptococcus pneumoniae NOT DETECTED NOT DETECTED Final   Streptococcus pyogenes NOT DETECTED NOT DETECTED Final   Acinetobacter baumannii NOT DETECTED NOT DETECTED Final   Enterobacteriaceae species DETECTED (A) NOT DETECTED Final    Comment: Enterobacteriaceae represent a large family of gram-negative bacteria, not a single organism. CRITICAL RESULT CALLED TO, READ BACK BY AND VERIFIED WITH: PHARMD J GRIMSLEY [id number]  MLM    Enterobacter cloacae complex NOT DETECTED NOT DETECTED Final   Escherichia coli DETECTED (A) NOT DETECTED Final    Comment: CRITICAL RESULT CALLED TO, READ BACK BY AND VERIFIED WITH: PHARMD J GRIMSLEY [id number] MLM    Klebsiella oxytoca NOT DETECTED NOT DETECTED Final   Klebsiella pneumoniae NOT DETECTED NOT DETECTED Final   Proteus species NOT DETECTED NOT DETECTED Final   Serratia marcescens NOT DETECTED NOT DETECTED Final   Carbapenem resistance NOT DETECTED NOT DETECTED Final   Haemophilus influenzae NOT DETECTED NOT DETECTED Final   Neisseria meningitidis NOT DETECTED NOT DETECTED Final   Pseudomonas aeruginosa NOT DETECTED NOT DETECTED Final   Candida albicans NOT DETECTED NOT DETECTED Final   Candida glabrata NOT DETECTED NOT DETECTED Final   Candida krusei NOT DETECTED NOT DETECTED Final   Candida parapsilosis NOT DETECTED NOT DETECTED Final   Candida tropicalis NOT DETECTED NOT DETECTED Final    Comment: Performed at [name], [place], [place], [place] [address]  MRSA PCR Screening     Status: None   Collection Time: 04/25/17  4:18 PM  Result Value Ref Range Status   MRSA by PCR NEGATIVE NEGATIVE Final    Comment:  The GeneXpert MRSA Assay (FDA approved for NASAL specimens only), is one component of a comprehensive MRSA colonization surveillance program. It is not intended to diagnose MRSA infection nor to guide or monitor treatment for MRSA infections. Performed at [name], [place] [address], [place], [place] [address]        Today   Subjective    [name] today has no Rita complaints, eager to return to SNF, sister at bedside, no f/c,          Patient has been seen and examined prior to discharge   Objective   Blood pressure (!) 157/77, pulse 100, temperature 98.5 F (36.9 C), temperature source Oral, resp. rate 19, height 5' 1.81" (1.57 m), weight 123.1 kg (271 lb 6.2 oz), SpO2 98  %.   Intake/Output Summary (Last 24 hours) at 04/28/2017 1511 Last data filed at 04/28/2017 1443 Gross per 24 hour  Intake 3491.67 ml  Output 1600 ml  Net 1891.67 ml    Exam General exam: More awake , morbidly obese, talking  HEENT:PERRL,Oral mucosa moist,  Respiratory system: Mostly clear bilaterally  cardiovascular system: RRR. No JVD, Gastrointestinal system: Abdomen is nondistended, soft and nontender. Bowel sounds present, increased truncal adiposity Central nervous system:   Residual facial droop , left-sided hemiparesis unchanged  extremities: No edema,  distal peripheral pulses palpable. Skin: No rashes, lesions or ulcers,no icterus ,no pallor    Data Review   CBC w Diff:  Lab Results  Component Value Date   WBC 14.7 (H) 04/27/2017   HGB 12.4 04/27/2017   HCT 37.5 04/27/2017   PLT 127 (L) 04/27/2017   LYMPHOPCT 17 04/24/2017   BANDSPCT 20 04/23/2017   MONOPCT 11 04/24/2017   EOSPCT 0 04/24/2017   BASOPCT 0 04/24/2017    CMP:  Lab Results  Component Value Date   NA 143 04/28/2017   K 4.4 04/28/2017   CL 112 (H) 04/28/2017   CO2 20 (L) 04/28/2017   BUN 10 04/28/2017   CREATININE 0.66 04/28/2017   PROT 5.8 (L) 04/23/2017   ALBUMIN 2.5 (L) 04/23/2017   BILITOT 0.6 04/23/2017   ALKPHOS 58 04/23/2017   AST 42 (H) 04/23/2017   ALT 15 04/23/2017   Total Discharge time is about 33 minutes  May Discharged to skilled nursing facility with peripheral IV/ Saline Lock on 04/27/17  Please discontinue saline lock/peripheral IV at SNF on 05/01/2017 after patient has completed IV Linus Galas M.D on 04/28/2017 at 3:11 PM  [facility]   Office  [phone]  Voice Recognition Viviann Spare dictation system was used to create this note, attempts have been made to correct errors. Please contact the author with questions and/or clarifications.

## 2017-04-28 NOTE — Clinical Social Work Placement (Signed)
Patient returning to [facility]. Facility aware of patient's discharge and confirmed patient's ability to return. PTAR contacted, CSW left voicemail for patient's daughter. Patient's RN can call report to [phone], packet complete. CSW signing off, no other needs identified at this time.  CLINICAL SOCIAL WORK PLACEMENT  NOTE  Date:  04/28/2017  Patient Details  Name: [name] MRN: [id number] Date of Birth: 1955/06/14  Clinical Social Work is seeking post-discharge placement for this patient at the [place] level of care (*CSW will initial, date and re-position this form in  chart as items are completed):  Yes   Patient/family provided with [facility] Work Department's list of facilities offering this level of care within the geographic area requested by the patient (or if unable, by the patient's family).  Yes   Patient/family informed of their freedom to choose among providers that offer the needed level of care, that participate in Medicare, Medicaid or managed care program needed by the patient, have an available bed and are willing to accept the patient.  Yes   Patient/family informed of [facility]'s ownership interest in [facility] and [facility], as well as of the fact that they are under no obligation to receive care at these facilities.  PASRR submitted to EDS on       PASRR number received on       Existing PASRR number confirmed on       FL2 transmitted to all facilities in geographic area requested by pt/family on 04/27/17     FL2 transmitted to all facilities within larger geographic area on       Patient informed that his/her managed care company has contracts with or will negotiate with certain facilities, including the following:        Yes(Patient from [facility])   Patient/family informed of bed offers received.  Patient chooses bed at [facility]     Physician recommends and patient chooses bed  at      Patient to be transferred to [facility] on 04/28/17.  Patient to be transferred to facility by PTAR     Patient family notified on 04/28/17 of transfer.  Name of family member notified:  Left vm for daughter [facility])     PHYSICIAN       Additional Comment:    _______________________________________________ Burnis Medin, LCSW 04/28/2017, 2:58 PM

## 2017-05-04 ENCOUNTER — Ambulatory Visit (INDEPENDENT_AMBULATORY_CARE_PROVIDER_SITE_OTHER): Payer: Medicare Other | Admitting: Urology

## 2017-05-04 DIAGNOSIS — N200 Calculus of kidney: Secondary | ICD-10-CM | POA: Diagnosis not present

## 2017-05-05 ENCOUNTER — Encounter (HOSPITAL_COMMUNITY): Payer: Self-pay | Admitting: *Deleted

## 2017-05-05 ENCOUNTER — Other Ambulatory Visit: Payer: Self-pay | Admitting: Urology

## 2017-05-06 NOTE — Progress Notes (Signed)
Preop instructions for:  [name]                        Date of Birth     [birth year]                       Date of Procedure: 05/17/2017        Doctor:Dr Franchot Gallo  Time to arrive at [facility]:  0530 Report to: Admitting  Procedure:  Cystoscopy, Ureteroscopy, laser,stent  Any procedure time changes, MD office will notify you!   Do not eat or drink past midnight the night before your procedure.(To include any tube feedings-must be discontinued)    Take these morning medications only with sips of water.(or give through gastrostomy or feeding tube). Amlodipine ( Norvasc), Depakote, Cymbalta, Labetalol, Keppra, Synthroid, Claritin, Prilosec, Oxycodone if needed, Eye drops if needed   Note: No Insulin or Diabetic meds should be given or taken the morning of the procedure!   Facility contact:  [facility]                  Phone:   [phone]                 Health Care POA:  Transportation contact phone#: [place] [facility] [place]  Please send day of procedure:current med list and meds last taken that day, confirm nothing by mouth status from what time, Patient Demographic info( to include DNR status, problem list, allergies)   RN contact name/phone#:     [place]                         and Fax [phone]  [place] card and picture ID Leave all jewelry and other valuables at place where living( no metal or rings to be worn) No contact lens Women-no make-up, no lotions,perfumes,powders Men-no colognes,lotions  Any questions day of procedure,call [facility]- [phone]    Sent from :[facility]                   [place]                   [phone]  Sent by :Gillian Shields RN

## 2017-05-10 NOTE — Progress Notes (Signed)
Faxed preop instructions to [facility] with confirmation fax and LVMM for daughter, Marilynne Halsted and husband , Ebelyn Bohnet regarding patient surgery on 05/17/2017. Asked them to call back at [phone] or [phone] regarding date and time of surgery.

## 2017-05-12 NOTE — Progress Notes (Signed)
gulianna, hornsby called back and stated he is aware of wife's surgery on 05/17/2017.  He will not be able to be here for surgery.  Mr.  Knipp states he is poa and wife is unable to sign consent.   Nurse will call back on 05/12/2017 when 2 RNs are available to do phone consent.

## 2017-05-13 NOTE — Progress Notes (Signed)
Obtain 2 nurse phone consent and signed and placed on chart from husband, Annice Jolly who states he will not be able to be here for surgery on 05/17/2017.   Also refaxed to [facility] - the preop instructions for patient.  [facility] gave me new fax number.

## 2017-05-16 MED ORDER — DEXTROSE 5 % IV SOLN
3.0000 g | INTRAVENOUS | Status: AC
  Administered 2017-05-17: 3 g via INTRAVENOUS
  Filled 2017-05-16: qty 3

## 2017-05-17 ENCOUNTER — Encounter (HOSPITAL_COMMUNITY)
Admission: RE | Disposition: A | Discharge status: Skilled Nursing Facility | Payer: Self-pay | Source: Ambulatory Visit | Attending: Urology

## 2017-05-17 ENCOUNTER — Inpatient Hospital Stay (HOSPITAL_COMMUNITY)
Admission: RE | Admit: 2017-05-17 | Discharge: 2017-05-21 | DRG: 853 | Disposition: A | Discharge status: Skilled Nursing Facility | Payer: Medicare Other | Source: Ambulatory Visit | Attending: Urology | Admitting: Urology

## 2017-05-17 ENCOUNTER — Ambulatory Visit (HOSPITAL_COMMUNITY): Payer: Medicare Other

## 2017-05-17 ENCOUNTER — Other Ambulatory Visit: Payer: Self-pay

## 2017-05-17 ENCOUNTER — Encounter (HOSPITAL_COMMUNITY): Payer: Self-pay | Admitting: *Deleted

## 2017-05-17 ENCOUNTER — Ambulatory Visit (HOSPITAL_COMMUNITY): Payer: Medicare Other | Admitting: Certified Registered"

## 2017-05-17 DIAGNOSIS — N200 Calculus of kidney: Secondary | ICD-10-CM

## 2017-05-17 DIAGNOSIS — G929 Unspecified toxic encephalopathy: Secondary | ICD-10-CM

## 2017-05-17 DIAGNOSIS — I10 Essential (primary) hypertension: Secondary | ICD-10-CM | POA: Diagnosis present

## 2017-05-17 DIAGNOSIS — N12 Tubulo-interstitial nephritis, not specified as acute or chronic: Secondary | ICD-10-CM

## 2017-05-17 DIAGNOSIS — E785 Hyperlipidemia, unspecified: Secondary | ICD-10-CM | POA: Diagnosis present

## 2017-05-17 DIAGNOSIS — G40909 Epilepsy, unspecified, not intractable, without status epilepticus: Secondary | ICD-10-CM | POA: Diagnosis present

## 2017-05-17 DIAGNOSIS — A419 Sepsis, unspecified organism: Principal | ICD-10-CM | POA: Diagnosis present

## 2017-05-17 DIAGNOSIS — Z7982 Long term (current) use of aspirin: Secondary | ICD-10-CM

## 2017-05-17 DIAGNOSIS — M353 Polymyalgia rheumatica: Secondary | ICD-10-CM | POA: Diagnosis present

## 2017-05-17 DIAGNOSIS — N390 Urinary tract infection, site not specified: Secondary | ICD-10-CM | POA: Diagnosis present

## 2017-05-17 DIAGNOSIS — Z1612 Extended spectrum beta lactamase (ESBL) resistance: Secondary | ICD-10-CM | POA: Diagnosis present

## 2017-05-17 DIAGNOSIS — G20 Parkinson's disease: Secondary | ICD-10-CM | POA: Diagnosis present

## 2017-05-17 DIAGNOSIS — B9620 Unspecified Escherichia coli [E. coli] as the cause of diseases classified elsewhere: Secondary | ICD-10-CM | POA: Diagnosis present

## 2017-05-17 DIAGNOSIS — M1990 Unspecified osteoarthritis, unspecified site: Secondary | ICD-10-CM | POA: Diagnosis present

## 2017-05-17 DIAGNOSIS — Z8673 Personal history of transient ischemic attack (TIA), and cerebral infarction without residual deficits: Secondary | ICD-10-CM

## 2017-05-17 DIAGNOSIS — Z885 Allergy status to narcotic agent status: Secondary | ICD-10-CM

## 2017-05-17 DIAGNOSIS — E039 Hypothyroidism, unspecified: Secondary | ICD-10-CM | POA: Diagnosis present

## 2017-05-17 DIAGNOSIS — F209 Schizophrenia, unspecified: Secondary | ICD-10-CM | POA: Diagnosis present

## 2017-05-17 DIAGNOSIS — A499 Bacterial infection, unspecified: Secondary | ICD-10-CM

## 2017-05-17 DIAGNOSIS — G92 Toxic encephalopathy: Secondary | ICD-10-CM

## 2017-05-17 DIAGNOSIS — Z792 Long term (current) use of antibiotics: Secondary | ICD-10-CM

## 2017-05-17 DIAGNOSIS — E1151 Type 2 diabetes mellitus with diabetic peripheral angiopathy without gangrene: Secondary | ICD-10-CM | POA: Diagnosis present

## 2017-05-17 DIAGNOSIS — Z79899 Other long term (current) drug therapy: Secondary | ICD-10-CM

## 2017-05-17 DIAGNOSIS — I69354 Hemiplegia and hemiparesis following cerebral infarction affecting left non-dominant side: Secondary | ICD-10-CM

## 2017-05-17 DIAGNOSIS — F329 Major depressive disorder, single episode, unspecified: Secondary | ICD-10-CM | POA: Diagnosis present

## 2017-05-17 DIAGNOSIS — G4731 Primary central sleep apnea: Secondary | ICD-10-CM | POA: Diagnosis present

## 2017-05-17 DIAGNOSIS — K219 Gastro-esophageal reflux disease without esophagitis: Secondary | ICD-10-CM | POA: Diagnosis present

## 2017-05-17 DIAGNOSIS — N202 Calculus of kidney with calculus of ureter: Secondary | ICD-10-CM | POA: Diagnosis present

## 2017-05-17 DIAGNOSIS — Z6841 Body Mass Index (BMI) 40.0 and over, adult: Secondary | ICD-10-CM

## 2017-05-17 HISTORY — DX: Tinea unguium: B35.1

## 2017-05-17 HISTORY — DX: Flaccid hemiplegia affecting unspecified side: G81.00

## 2017-05-17 HISTORY — DX: Morbid (severe) obesity due to excess calories: E66.01

## 2017-05-17 HISTORY — DX: Peripheral vascular disease, unspecified: I73.9

## 2017-05-17 HISTORY — PX: CYSTOSCOPY/URETEROSCOPY/HOLMIUM LASER/STENT PLACEMENT: SHX6546

## 2017-05-17 HISTORY — DX: Parkinson's disease without dyskinesia, without mention of fluctuations: G20.A1

## 2017-05-17 HISTORY — DX: Polymyalgia rheumatica: M35.3

## 2017-05-17 HISTORY — DX: Personal history of urinary calculi: Z87.442

## 2017-05-17 HISTORY — DX: Contracture, right knee: M24.561

## 2017-05-17 HISTORY — DX: Contracture, left knee: M24.562

## 2017-05-17 HISTORY — DX: Constipation, unspecified: K59.00

## 2017-05-17 HISTORY — DX: Hyperlipidemia, unspecified: E78.5

## 2017-05-17 HISTORY — DX: Dry eye syndrome of bilateral lacrimal glands: H04.123

## 2017-05-17 HISTORY — DX: Primary insomnia: F51.01

## 2017-05-17 HISTORY — DX: Chronic pain syndrome: G89.4

## 2017-05-17 HISTORY — DX: Gastro-esophageal reflux disease without esophagitis: K21.9

## 2017-05-17 HISTORY — DX: Candidiasis of skin and nail: B37.2

## 2017-05-17 HISTORY — DX: Unspecified psychosis not due to a substance or known physiological condition: F29

## 2017-05-17 HISTORY — PX: CYSTOSCOPY W/ URETERAL STENT REMOVAL: SHX1430

## 2017-05-17 HISTORY — DX: Unspecified cataract: H26.9

## 2017-05-17 HISTORY — DX: Dysphagia, unspecified: R13.10

## 2017-05-17 HISTORY — DX: Feeding difficulties, unspecified: R63.30

## 2017-05-17 HISTORY — DX: Parkinson's disease: G20

## 2017-05-17 HISTORY — DX: Sepsis, unspecified organism: A41.9

## 2017-05-17 HISTORY — DX: Muscle weakness (generalized): M62.81

## 2017-05-17 HISTORY — DX: Unspecified urinary incontinence: R32

## 2017-05-17 HISTORY — DX: Feeding difficulties: R63.3

## 2017-05-17 HISTORY — DX: Acute kidney failure, unspecified: N17.9

## 2017-05-17 LAB — BASIC METABOLIC PANEL
ANION GAP: 8 (ref 5–15)
BUN: 18 mg/dL (ref 6–20)
CO2: 31 mmol/L (ref 22–32)
Calcium: 8.9 mg/dL (ref 8.9–10.3)
Chloride: 104 mmol/L (ref 101–111)
Creatinine, Ser: 0.84 mg/dL (ref 0.44–1.00)
Glucose, Bld: 110 mg/dL — ABNORMAL HIGH (ref 65–99)
POTASSIUM: 3.9 mmol/L (ref 3.5–5.1)
SODIUM: 143 mmol/L (ref 135–145)

## 2017-05-17 LAB — CBC
HCT: 37.9 % (ref 36.0–46.0)
HEMOGLOBIN: 11.7 g/dL — AB (ref 12.0–15.0)
MCH: 29.8 pg (ref 26.0–34.0)
MCHC: 30.9 g/dL (ref 30.0–36.0)
MCV: 96.7 fL (ref 78.0–100.0)
PLATELETS: 149 10*3/uL — AB (ref 150–400)
RBC: 3.92 MIL/uL (ref 3.87–5.11)
RDW: 15.8 % — ABNORMAL HIGH (ref 11.5–15.5)
WBC: 5.6 10*3/uL (ref 4.0–10.5)

## 2017-05-17 LAB — GLUCOSE, CAPILLARY: GLUCOSE-CAPILLARY: 110 mg/dL — AB (ref 65–99)

## 2017-05-17 SURGERY — CYSTOSCOPY/URETEROSCOPY/HOLMIUM LASER/STENT PLACEMENT
Anesthesia: General | Laterality: Right

## 2017-05-17 MED ORDER — OXYCODONE HCL 5 MG PO TABS: 5.0000 mg | ORAL_TABLET | Freq: Once | ORAL | Status: DC | PRN

## 2017-05-17 MED ORDER — ONDANSETRON HCL 4 MG/2ML IJ SOLN
INTRAMUSCULAR | Status: DC | PRN
  Administered 2017-05-17: 4 mg via INTRAVENOUS

## 2017-05-17 MED ORDER — FUROSEMIDE 20 MG PO TABS
20.0000 mg | ORAL_TABLET | Freq: Two times a day (BID) | ORAL | Status: DC
  Administered 2017-05-17 – 2017-05-21 (×5): 20 mg via ORAL
  Filled 2017-05-17 (×7): qty 1

## 2017-05-17 MED ORDER — ACETAMINOPHEN 325 MG PO TABS
650.0000 mg | ORAL_TABLET | ORAL | Status: DC | PRN
  Administered 2017-05-18: 650 mg via ORAL
  Filled 2017-05-17: qty 2

## 2017-05-17 MED ORDER — PHENYLEPHRINE HCL 10 MG/ML IJ SOLN
INTRAVENOUS | Status: DC | PRN
  Administered 2017-05-17: 15 ug/min via INTRAVENOUS

## 2017-05-17 MED ORDER — SODIUM CHLORIDE 0.9 % IR SOLN
Status: DC | PRN
  Administered 2017-05-17: 6000 mL

## 2017-05-17 MED ORDER — LEVETIRACETAM 500 MG PO TABS: 1500.0000 mg | ORAL_TABLET | Freq: Two times a day (BID) | ORAL | Status: DC

## 2017-05-17 MED ORDER — TRAZODONE HCL 50 MG PO TABS
50.0000 mg | ORAL_TABLET | Freq: Every day | ORAL | Status: DC
  Administered 2017-05-18 – 2017-05-19 (×2): 50 mg via ORAL
  Filled 2017-05-17 (×3): qty 1

## 2017-05-17 MED ORDER — PROPOFOL 10 MG/ML IV BOLUS
INTRAVENOUS | Status: DC | PRN
  Administered 2017-05-17: 150 mg via INTRAVENOUS

## 2017-05-17 MED ORDER — LACTATED RINGERS IV SOLN
INTRAVENOUS | Status: DC | PRN
  Administered 2017-05-17: 07:00:00 via INTRAVENOUS

## 2017-05-17 MED ORDER — OXYCODONE HCL 5 MG PO TABS
5.0000 mg | ORAL_TABLET | ORAL | Status: DC | PRN
  Administered 2017-05-17 – 2017-05-21 (×11): 5 mg via ORAL
  Filled 2017-05-17 (×12): qty 1

## 2017-05-17 MED ORDER — SODIUM CHLORIDE 0.45 % IV SOLN
INTRAVENOUS | Status: DC
  Administered 2017-05-17 – 2017-05-18 (×2): via INTRAVENOUS

## 2017-05-17 MED ORDER — CLONIDINE HCL 0.1 MG PO TABS: 0.1000 mg | ORAL_TABLET | Freq: Four times a day (QID) | ORAL | Status: DC | PRN

## 2017-05-17 MED ORDER — PANTOPRAZOLE SODIUM 40 MG PO TBEC
40.0000 mg | DELAYED_RELEASE_TABLET | Freq: Every day | ORAL | Status: DC
  Administered 2017-05-17 – 2017-05-21 (×5): 40 mg via ORAL
  Filled 2017-05-17 (×5): qty 1

## 2017-05-17 MED ORDER — SODIUM CHLORIDE 0.9 % IV BOLUS (SEPSIS)
1000.0000 mL | Freq: Once | INTRAVENOUS | Status: AC
  Administered 2017-05-17: 1000 mL via INTRAVENOUS

## 2017-05-17 MED ORDER — FENTANYL CITRATE (PF) 250 MCG/5ML IJ SOLN
INTRAMUSCULAR | Status: AC
  Filled 2017-05-17: qty 5

## 2017-05-17 MED ORDER — DULOXETINE HCL 60 MG PO CPEP
60.0000 mg | ORAL_CAPSULE | Freq: Every day | ORAL | Status: DC
  Administered 2017-05-17 – 2017-05-19 (×2): 60 mg via ORAL
  Filled 2017-05-17 (×5): qty 1

## 2017-05-17 MED ORDER — LIDOCAINE 5 % EX PTCH
1.0000 | MEDICATED_PATCH | CUTANEOUS | Status: DC
  Administered 2017-05-18 – 2017-05-21 (×4): 1 via TRANSDERMAL
  Filled 2017-05-17 (×4): qty 1

## 2017-05-17 MED ORDER — PROPOFOL 10 MG/ML IV BOLUS
INTRAVENOUS | Status: AC
  Filled 2017-05-17: qty 20

## 2017-05-17 MED ORDER — LEVOTHYROXINE SODIUM 100 MCG PO TABS
100.0000 ug | ORAL_TABLET | Freq: Every day | ORAL | Status: DC
  Administered 2017-05-18 – 2017-05-21 (×4): 100 ug via ORAL
  Filled 2017-05-17 (×4): qty 1

## 2017-05-17 MED ORDER — FENTANYL CITRATE (PF) 100 MCG/2ML IJ SOLN
25.0000 ug | INTRAMUSCULAR | Status: DC | PRN
  Administered 2017-05-17 (×3): 25 ug via INTRAVENOUS

## 2017-05-17 MED ORDER — LOSARTAN POTASSIUM 50 MG PO TABS
50.0000 mg | ORAL_TABLET | Freq: Every day | ORAL | Status: DC
  Administered 2017-05-18 – 2017-05-20 (×2): 50 mg via ORAL
  Filled 2017-05-17 (×3): qty 1

## 2017-05-17 MED ORDER — MIDAZOLAM HCL 2 MG/2ML IJ SOLN
INTRAMUSCULAR | Status: AC
  Filled 2017-05-17: qty 2

## 2017-05-17 MED ORDER — POLYETHYLENE GLYCOL 3350 17 G PO PACK
17.0000 g | PACK | Freq: Every day | ORAL | Status: DC
  Administered 2017-05-17 – 2017-05-21 (×5): 17 g via ORAL
  Filled 2017-05-17 (×5): qty 1

## 2017-05-17 MED ORDER — DIVALPROEX SODIUM 125 MG PO DR TAB
125.0000 mg | DELAYED_RELEASE_TABLET | Freq: Two times a day (BID) | ORAL | Status: DC
  Administered 2017-05-17 – 2017-05-21 (×9): 125 mg via ORAL
  Filled 2017-05-17 (×9): qty 1

## 2017-05-17 MED ORDER — IPRATROPIUM-ALBUTEROL 0.5-2.5 (3) MG/3ML IN SOLN
3.0000 mL | Freq: Once | RESPIRATORY_TRACT | Status: AC
  Administered 2017-05-17: 3 mL via RESPIRATORY_TRACT
  Filled 2017-05-17 (×2): qty 3

## 2017-05-17 MED ORDER — LORATADINE 10 MG PO TABS: 10.0000 mg | ORAL_TABLET | Freq: Every day | ORAL | Status: DC | PRN

## 2017-05-17 MED ORDER — LEVETIRACETAM 500 MG PO TABS
1500.0000 mg | ORAL_TABLET | Freq: Every day | ORAL | Status: DC
  Administered 2017-05-17 – 2017-05-20 (×4): 1500 mg via ORAL
  Filled 2017-05-17 (×4): qty 3

## 2017-05-17 MED ORDER — LEVETIRACETAM 500 MG PO TABS
1000.0000 mg | ORAL_TABLET | Freq: Every day | ORAL | Status: DC
  Administered 2017-05-17 – 2017-05-21 (×5): 1000 mg via ORAL
  Filled 2017-05-17 (×5): qty 2

## 2017-05-17 MED ORDER — ONDANSETRON HCL 4 MG/2ML IJ SOLN: 4.0000 mg | Freq: Four times a day (QID) | INTRAMUSCULAR | Status: DC | PRN

## 2017-05-17 MED ORDER — OLOPATADINE HCL 0.1 % OP SOLN
1.0000 [drp] | Freq: Every day | OPHTHALMIC | Status: DC
  Administered 2017-05-17 – 2017-05-20 (×4): 1 [drp] via OPHTHALMIC
  Filled 2017-05-17: qty 5

## 2017-05-17 MED ORDER — ROSUVASTATIN CALCIUM 5 MG PO TABS
5.0000 mg | ORAL_TABLET | Freq: Every day | ORAL | Status: DC
  Administered 2017-05-17 – 2017-05-20 (×4): 5 mg via ORAL
  Filled 2017-05-17 (×4): qty 1

## 2017-05-17 MED ORDER — MIDAZOLAM HCL 2 MG/2ML IJ SOLN
INTRAMUSCULAR | Status: DC | PRN
  Administered 2017-05-17: 1 mg via INTRAVENOUS

## 2017-05-17 MED ORDER — ONDANSETRON HCL 4 MG/2ML IJ SOLN: 4.0000 mg | INTRAMUSCULAR | Status: DC | PRN

## 2017-05-17 MED ORDER — LACTULOSE 10 GM/15ML PO SOLN: 20.0000 g | Freq: Every day | ORAL | Status: DC | PRN

## 2017-05-17 MED ORDER — CEFAZOLIN SODIUM-DEXTROSE 1-4 GM/50ML-% IV SOLN
1.0000 g | Freq: Three times a day (TID) | INTRAVENOUS | Status: DC
  Administered 2017-05-17 – 2017-05-18 (×3): 1 g via INTRAVENOUS
  Filled 2017-05-17 (×3): qty 50

## 2017-05-17 MED ORDER — LIDOCAINE 2% (20 MG/ML) 5 ML SYRINGE
INTRAMUSCULAR | Status: DC | PRN
  Administered 2017-05-17: 20 mg via INTRAVENOUS

## 2017-05-17 MED ORDER — DULOXETINE HCL 20 MG PO CPEP: 20.0000 mg | ORAL_CAPSULE | Freq: Every day | ORAL | Status: DC

## 2017-05-17 MED ORDER — AMLODIPINE BESYLATE 10 MG PO TABS
10.0000 mg | ORAL_TABLET | Freq: Every day | ORAL | Status: DC
  Administered 2017-05-17 – 2017-05-21 (×3): 10 mg via ORAL
  Filled 2017-05-17 (×5): qty 1

## 2017-05-17 MED ORDER — POLYETHYL GLYCOL-PROPYL GLYCOL 0.4-0.3 % OP SOLN: 1.0000 [drp] | Freq: Three times a day (TID) | OPHTHALMIC | Status: DC | PRN

## 2017-05-17 MED ORDER — POLYVINYL ALCOHOL 1.4 % OP SOLN
1.0000 [drp] | Freq: Three times a day (TID) | OPHTHALMIC | Status: DC | PRN
  Filled 2017-05-17: qty 15

## 2017-05-17 MED ORDER — ASPIRIN EC 81 MG PO TBEC
81.0000 mg | DELAYED_RELEASE_TABLET | Freq: Every day | ORAL | Status: DC
  Administered 2017-05-17 – 2017-05-21 (×5): 81 mg via ORAL
  Filled 2017-05-17 (×5): qty 1

## 2017-05-17 MED ORDER — FENTANYL CITRATE (PF) 250 MCG/5ML IJ SOLN
INTRAMUSCULAR | Status: DC | PRN
  Administered 2017-05-17 (×5): 25 ug via INTRAVENOUS
  Administered 2017-05-17: 50 ug via INTRAVENOUS
  Administered 2017-05-17 (×2): 25 ug via INTRAVENOUS

## 2017-05-17 MED ORDER — LABETALOL HCL 200 MG PO TABS
200.0000 mg | ORAL_TABLET | Freq: Two times a day (BID) | ORAL | Status: DC
  Administered 2017-05-17 – 2017-05-21 (×5): 200 mg via ORAL
  Filled 2017-05-17 (×8): qty 1

## 2017-05-17 MED ORDER — FENTANYL CITRATE (PF) 100 MCG/2ML IJ SOLN
INTRAMUSCULAR | Status: AC
  Filled 2017-05-17: qty 2

## 2017-05-17 MED ORDER — ENOXAPARIN SODIUM 40 MG/0.4ML ~~LOC~~ SOLN
40.0000 mg | SUBCUTANEOUS | Status: DC
  Administered 2017-05-17 – 2017-05-20 (×4): 40 mg via SUBCUTANEOUS
  Filled 2017-05-17 (×4): qty 0.4

## 2017-05-17 MED ORDER — OXYCODONE HCL 5 MG/5ML PO SOLN
5.0000 mg | Freq: Once | ORAL | Status: DC | PRN
  Filled 2017-05-17: qty 5

## 2017-05-17 MED ORDER — DIVALPROEX SODIUM 250 MG PO DR TAB
500.0000 mg | DELAYED_RELEASE_TABLET | Freq: Two times a day (BID) | ORAL | Status: DC
  Administered 2017-05-17 – 2017-05-21 (×9): 500 mg via ORAL
  Filled 2017-05-17 (×8): qty 2

## 2017-05-17 SURGICAL SUPPLY — 29 items
BAG URINE DRAINAGE (UROLOGICAL SUPPLIES) ×1 IMPLANT
BAG URO CATCHER STRL LF (MISCELLANEOUS) ×2 IMPLANT
BASKET LASER NITINOL 1.9FR (BASKET) ×1 IMPLANT
BASKET ZERO TIP NITINOL 2.4FR (BASKET) IMPLANT
BSKT STON RTRVL 120 1.9FR (BASKET)
BSKT STON RTRVL ZERO TP 2.4FR (BASKET)
CATH FOLEY 2WAY SLVR  5CC 16FR (CATHETERS) ×1
CATH FOLEY 2WAY SLVR 5CC 16FR (CATHETERS) IMPLANT
CATH INTERMIT  6FR 70CM (CATHETERS) ×1 IMPLANT
CLOTH BEACON ORANGE TIMEOUT ST (SAFETY) ×2 IMPLANT
COVER FOOTSWITCH UNIV (MISCELLANEOUS) ×1 IMPLANT
COVER SURGICAL LIGHT HANDLE (MISCELLANEOUS) ×2 IMPLANT
EXTRACTOR STONE NITINOL NGAGE (UROLOGICAL SUPPLIES) ×1 IMPLANT
FIBER LASER FLEXIVA 365 (UROLOGICAL SUPPLIES) IMPLANT
FIBER LASER TRAC TIP (UROLOGICAL SUPPLIES) ×1 IMPLANT
GLOVE BIOGEL M 8.0 STRL (GLOVE) ×2 IMPLANT
GOWN STRL REUS W/ TWL XL LVL3 (GOWN DISPOSABLE) ×1 IMPLANT
GOWN STRL REUS W/TWL LRG LVL3 (GOWN DISPOSABLE) ×4 IMPLANT
GOWN STRL REUS W/TWL XL LVL3 (GOWN DISPOSABLE) ×2
GUIDEWIRE ANG ZIPWIRE 038X150 (WIRE) ×2 IMPLANT
GUIDEWIRE STR DUAL SENSOR (WIRE) ×2 IMPLANT
IV NS 1000ML (IV SOLUTION) ×2
IV NS 1000ML BAXH (IV SOLUTION) ×1 IMPLANT
MANIFOLD NEPTUNE II (INSTRUMENTS) ×2 IMPLANT
PACK CYSTO (CUSTOM PROCEDURE TRAY) ×2 IMPLANT
SHEATH URET ACCESS 12FR/35CM (UROLOGICAL SUPPLIES) ×1 IMPLANT
STENT CONTOUR 6FRX24X.038 (STENTS) ×2 IMPLANT
TUBING CONNECTING 10 (TUBING) ×2 IMPLANT
TUBING UROLOGY SET (TUBING) ×2 IMPLANT

## 2017-05-17 NOTE — Interval H&P Note (Signed)
History and Physical Interval Note:  05/17/2017 7:22 AM  [name]  has presented today for surgery, with the diagnosis of BILATERAL RENAL STONES  The various methods of treatment have been discussed with the patient and family. After consideration of risks, benefits and other options for treatment, the patient has consented to  Procedure(s): CYSTOSCOPY/URETEROSCOPY/HOLMIUM LASER/STENT PLACEMENT/BILATERAL STENT PLACEMENTS  ALSO RIGHT STONE EXTRACTION (Right) CYSTOSCOPY WITH STENT REMOVAL (Right) as a surgical intervention .  The patient's history has been reviewed, patient examined, no change in status, stable for surgery.  I have reviewed the patient's chart and labs.  Questions were answered to the patient's satisfaction.     [name]

## 2017-05-17 NOTE — Anesthesia Procedure Notes (Signed)
Date/Time: 05/17/2017 10:07 AM Performed by: Cynda Familia, CRNA Oxygen Delivery Method: Simple face mask

## 2017-05-17 NOTE — H&P (Signed)
H&P  Chief Complaint: Kidney stones   History of Present Illness: [name] is a 62 y.o. year old female presenting for ureteroscopic mgmt of a 13 mm right renal stone. She has had known bilateral renal calculi followed medically. However, she presented with an infected/obstructing rt UPJ stone requiring urgent stentinsg2/14/2019. She now presents for right URS with HLL and extraction of the stone as well as stenting of her left kidney for eventual URS of that side.  Past Medical History:  Diagnosis Date  . Acute kidney failure (HCC)    hx of   . Acute respiratory failure ([place]) 07/27/2012  . Arthritis    OSTEO LEFT LEG  . Bilateral cataracts   . Candidiasis of skin and nail   . Chronic pain   . Chronic pain syndrome   . Constipation   . Contractures involving both knees    and ankles   . CVA (cerebral vascular accident) ([place])    left sided hemiparesis  . Depression   . Diabetes mellitus without complication ([place])    type II  . Dry eye syndrome of bilateral lacrimal glands   . Dysphagia   . Feeding difficulties   . 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 GERD (gastroesophageal reflux disease)   . Hemiparesis affecting left side as late effect of cerebrovascular accident ([place])   . History of kidney stones   . Hyperlipidemia   . Hypertension   . Metabolic encephalopathy 38/46/6599  . Morbid obesity ([place])   . Muscle weakness (generalized)   . Narcotic overdose ([place]) 07/27/2012  . Parkinson's disease ([place])   . Peripheral vascular disease ([place])   . Polymyalgia rheumatica ([place])   . Primary insomnia   . Respiratory failure ([place]) 05/06/10  . Seizure disorder ([place])   . Sepsis ([place])    hx of   . Sleep apnea 04/2010   on CPAP, "severe central sleep apnea"  . Spasticity    chronic  . Thrombocytopenia ([place])    related to depakote  . Thrombocytopenia ([place])   . Tinea unguium   . Unspecified hypothyroidism   . 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 Unspecified psychosis not due to a  substance or known physiological condition ([place])   . Urinary incontinence   . UTI (lower urinary tract infection) 05/06/10    Past Surgical History:  Procedure Laterality Date  . CEREBRAL ANEURYSM REPAIR  1999  . CYSTOSCOPY W/ URETERAL STENT PLACEMENT Bilateral 04/22/2017   Procedure: CYSTOSCOPY WITH BILATERAL  RETROGRADE PYELOGRAM, RIGHT URETERAL STENT PLACEMENT;  Surgeon: Festus Aloe, MD;  Location: [facility];  Service: Urology;  Laterality: Bilateral;  . INTRATHECAL PUMP IMPLANTATION  2010   [employer]:  fentanyl, baclofen changed to morphine and baclofen on 07/2012    Home Medications:  Medications Prior to Admission  Medication Sig Dispense Refill  . acetaminophen (TYLENOL) 325 MG tablet Take 2 tablets (650 mg total) by mouth every 4 (four) hours as needed for mild pain (or temp > 37.5 C (99.5 F)). 30 tablet 2  . amLODipine (NORVASC) 10 MG tablet Take 1 tablet (10 mg total) by mouth daily. (Patient taking differently: Take 10 mg by mouth daily. ) 30 tablet 3  . aspirin EC 81 MG tablet Take 81 mg by mouth daily.    . cloNIDine (CATAPRES) 0.1 MG tablet Take 0.1 mg by mouth every 6 (six) hours as needed (for SBP greater than 160).    Marland Kitchen divalproex (DEPAKOTE) 125 MG DR tablet Take 125 mg by mouth 2 (  two) times daily.    . divalproex (DEPAKOTE) 500 MG DR tablet Take 500 mg by mouth 2 (two) times daily.    . DULoxetine (CYMBALTA) 20 MG capsule Take 20 mg by mouth daily.    . DULoxetine (CYMBALTA) 60 MG capsule Take 60 mg by mouth daily.    . furosemide (LASIX) 20 MG tablet Take 20 mg by mouth 2 (two) times daily.    Marland Kitchen labetalol (NORMODYNE) 200 MG tablet Take 200 mg by mouth 2 (two) times daily.    Marland Kitchen lactulose, encephalopathy, (CHRONULAC) 10 GM/15ML SOLN Take 20 g by mouth daily.     Marland Kitchen levETIRAcetam (KEPPRA) 500 MG tablet Take 1,500 mg by mouth 2 (two) times daily.     Marland Kitchen levothyroxine (SYNTHROID, LEVOTHROID) 100 MCG tablet Take 100 mcg by mouth daily before breakfast.    . lidocaine  (LIDODERM) 5 % Place 1 patch onto the skin daily. Remove & Discard patch within 12 hours or as directed by MD    . loratadine (CLARITIN) 10 MG tablet Take 10 mg by mouth daily.    Marland Kitchen losartan (COZAAR) 50 MG tablet Take 50 mg by mouth at bedtime.    . Menthol, Topical Analgesic, 5 % GEL Apply 1 application topically 4 (four) times daily as needed (for pain).     . Multiple Vitamin (MULTIVITAMIN WITH MINERALS) TABS tablet Take 1 tablet by mouth daily.    . Olopatadine HCl (PATADAY) 0.2 % SOLN Place 1 drop into both eyes at bedtime.    Marland Kitchen omeprazole (PRILOSEC) 20 MG capsule Take 20 mg by mouth daily.    Marland Kitchen oxyCODONE (OXY IR/ROXICODONE) 5 MG immediate release tablet Take 1 tablet (5 mg total) by mouth every 8 (eight) hours as needed. 15 tablet 0  . Polyethyl Glycol-Propyl Glycol (SYSTANE) 0.4-0.3 % SOLN Place 1 drop into both eyes 3 (three) times daily as needed.    . polyethylene glycol (MIRALAX / GLYCOLAX) packet Take 17 g by mouth daily.    . rosuvastatin (CRESTOR) 5 MG tablet Take 5 mg by mouth at bedtime.    . traZODone (DESYREL) 50 MG tablet Take 50 mg by mouth at bedtime.    . Vitamin D, Ergocalciferol, (DRISDOL) 50000 units CAPS capsule Take 50,000 Units by mouth every 30 (thirty) days.      Allergies:  Allergies  Allergen Reactions  . Codeine Other (See Comments)    unknown  . Lacosamide Other (See Comments)    Mood changes/agitation No rx noted    . Morphine Other (See Comments)    No rx noted    . Zolpidem     History reviewed. No pertinent family history.  Social History:  reports that  has never smoked. she has never used smokeless tobacco. She reports that she does not drink alcohol or use drugs.  ROS: A complete review of systems was performed.  All systems are negative except for pertinent findings as noted.  Physical Exam:  Vital signs in last 24 hours: Temp:  [98.4 F (36.9 C)] 98.4 F (36.9 C) (03/11 0543) Pulse Rate:  [79] 79 (03/11 0543) Resp:  [18] 18 (03/11  0543) BP: (135)/(67) 135/67 (03/11 0543) SpO2:  [93 %] 93 % (03/11 0543) Weight:  [122.9 kg (271 lb)] 122.9 kg (271 lb) (03/11 0617) General:  Alert and oriented, No acute distress HEENT: Normocephalic, atraumatic Neck: No JVD or lymphadenopathy Cardiovascular: Regular rate and rhythm Lungs: Clear bilaterally Abdomen: Soft, nontender, nondistended, no abdominal masses Back: No CVA tenderness  Extremities: No edema Neurologic: Grossly intact  Laboratory Data:  Results for orders placed or performed during the hospital encounter of 05/17/17 (from the past 24 hour(s))  Glucose, capillary     Status: Abnormal   Collection Time: 05/17/17  5:45 AM  Result Value Ref Range   Glucose-Capillary 110 (H) 65 - 99 mg/dL   Comment 1 Notify RN    No results found for this or any previous visit (from the past 240 hour(s)). Creatinine: No results for input(s): CREATININE in the last 168 hours.  Radiologic Imaging: No results found.  Impression/Assessment:  13 mm right renal stone, s/p recent stenting for obstruction/infection  Plan:  Cysto, right J2 stent extraction, HLL/extraction of stone, bilateral stenting  [name] Theotis Gerdeman 05/17/2017, 6:27 AM  [name]. Kyri Shader MD

## 2017-05-17 NOTE — Transfer of Care (Signed)
Immediate Anesthesia Transfer of Care Note  Patient: [name]  Procedure(s) Performed: CYSTOSCOPY/URETEROSCOPY/HOLMIUM LASER/STENT PLACEMENT/BILATERAL STENT PLACEMENTS ,RIGHT STONE EXTRACTION (Right ) CYSTOSCOPY WITH STENT REMOVAL (Right )  Patient Location: PACU  Anesthesia Type:General  Level of Consciousness: sedated  Airway & Oxygen Therapy: Patient Spontanous Breathing and Patient connected to face mask oxygen  Post-op Assessment: Report given to RN and Post -op Vital signs reviewed and stable  Post vital signs: Reviewed and stable  Last Vitals:  Vitals:   05/17/17 0543  BP: 135/67  Pulse: 79  Resp: 18  Temp: 36.9 C  SpO2: 93%    Last Pain:  Vitals:   05/17/17 0543  TempSrc: Oral         Complications: No apparent anesthesia complications

## 2017-05-17 NOTE — Anesthesia Preprocedure Evaluation (Addendum)
Anesthesia Evaluation  Patient identified by MRN, date of birth, ID band Patient awake    Reviewed: Allergy & Precautions, H&P , NPO status , Patient's Chart, lab work & pertinent test results  Airway Mallampati: II   Neck ROM: full    Dental  (+) Dental Advisory Given, Missing   Pulmonary sleep apnea ,    breath sounds clear to auscultation       Cardiovascular hypertension, + Peripheral Vascular Disease   Rhythm:regular Rate:Normal     Neuro/Psych Seizures -,  PSYCHIATRIC DISORDERS Depression Schizophrenia hemiplegia CVA    GI/Hepatic GERD  ,  Endo/Other  diabetes, Type 2Hypothyroidism Morbid obesity  Renal/GU Renal disease     Musculoskeletal  (+) Arthritis ,   Abdominal   Peds  Hematology   Anesthesia Other Findings   Reproductive/Obstetrics                            Anesthesia Physical Anesthesia Plan  ASA: III  Anesthesia Plan: General   Post-op Pain Management:    Induction: Intravenous  PONV Risk Score and Plan: 3 and Ondansetron and Treatment may vary due to age or medical condition  Airway Management Planned: LMA  Additional Equipment:   Intra-op Plan:   Post-operative Plan:   Informed Consent: I have reviewed the patients History and Physical, chart, labs and discussed the procedure including the risks, benefits and alternatives for the proposed anesthesia with the patient or authorized representative who has indicated his/her understanding and acceptance.     Plan Discussed with: CRNA, Anesthesiologist and Surgeon  Anesthesia Plan Comments:         Anesthesia Quick Evaluation

## 2017-05-17 NOTE — Progress Notes (Signed)
Dr. Marcie Bal called in reference pt decreased O2 Sats on O2. Orders given (See MAR) and will continue to monitor and tx pt acording to MD orders. VS and rders assessed and pt has no s/s of distress.

## 2017-05-17 NOTE — Op Note (Signed)
Preoperative diagnosis: Large right renal calculus with history of UTI/sepsis  Postoperative diagnosis: Same  Principal procedure: Cystoscopy, right double-J stent extraction, right ureteroscopy, holmium laser lithotripsy and extraction of right renal calculi, left retrograde ureteropyelogram with fluoroscopic interpretation, placement of bilateral 24 cm by 6 French contour double-J t stents without tether's  Surgeon: Imari Reen  Anesthesia: General  Complications: None  Drains: 56 French Foley catheter, bilateral 24 cm by 6 [place] contour double-J stent without tether's  Specimen: Stones, urine for culture  Indications: 62 year old female recently admitted for pyelonephrosis secondary to a right UPJ stone with obstruction/sepsis.  She was stented urgently on 2/14, and presents at this time following antibiotic management for ureteroscopy, laser and extraction of right renal stone, as well as stent placement on the left in anticipation of eventual ureteroscopy on that side.  The patient was counseled in the office regarding the procedure, as well as risks and complications including but not limited to ureteral injury, inability to access the kidney, need for stent placement as well as urinary tract infection.  She understands the procedure, risks, complications and desires to proceed.  Description of procedure: The patient was properly identified in the holding area and the appropriate surgical sites marked.  She received preoperative IV antibiotics.  She was taken to the operating room where general anesthetic was administered.  She was placed in the dorsolithotomy position.  Genitalia and perineum were prepped and draped, proper timeout was performed.  A 22 French panendoscope was advanced into the bladder which was inspected and found to be normal except for the stent present at the right ureteral orifice.  This was partially extracted and then cannulated with a sensor tip guidewire which  fluoroscopically was advanced into the upper pole calyceal system on the right.  The cystoscope was removed and a 58 French ureteral access catheter was placed using the obturator.  The obturator and guidewire were removed and the flexible ureteroscope was advanced into the renal pelvis.  One large stone was seen consistent with the above-mentioned stone.  A 200 m fiber was then used to apply holmium laser energy to the stone, breaking it into multiple small fragments.  Fragments were carefully extracted through the access sheath.  This took an inordinate amount of time.  Larger fragments were fragmented again with the laser.  Careful removal of the vast majority of the stone fragments was performed, taking care not to injure the ureter.  Following inspection of the calyceal system and finding that more than 95% of the stone fragments had been removed, felt at this point it was worthwhile to just place a stent, which was performed using fluoroscopic and cystoscopic guidance.  A 24 cm by 6 French contour stent was placed.  At this point, left retrograde ureteropyelogram was performed on the left.  This was performed with Omnipaque.  This revealed a normal ureter.  Filling defect in 1 of the calyces consistent with the stone which will be managed at a later time.  No obstruction or hydronephrosis was noted.  Through the open-ended catheter, the sensor tip guidewire was advanced into the upper pole calyceal system.  The open-ended catheter was removed and a similar stent placed on the left side with adequate positioning seen.  At this point, the scope was removed.  I placed a 16 French Foley catheter.  Balloon filled with 10 cc of water and hooked to dependent drainage.  At this point, the patient was awakened.  She was extubated and taken to the  PACU in stable condition, having tolerated the procedure well.

## 2017-05-17 NOTE — Anesthesia Procedure Notes (Signed)
Performed by: Cynda Familia, CRNA Ventilation: Nasal airway inserted- appropriate to patient size

## 2017-05-17 NOTE — Anesthesia Postprocedure Evaluation (Signed)
Anesthesia Post Note  Patient: [name]  Procedure(s) Performed: CYSTOSCOPY/URETEROSCOPY/HOLMIUM LASER/STENT PLACEMENT/BILATERAL STENT PLACEMENTS ,RIGHT STONE EXTRACTION (Right ) CYSTOSCOPY WITH STENT REMOVAL (Right )     Patient location during evaluation: PACU Anesthesia Type: General Level of consciousness: awake and alert Pain management: pain level controlled Vital Signs Assessment: post-procedure vital signs reviewed and stable Respiratory status: spontaneous breathing, nonlabored ventilation, respiratory function stable and patient connected to nasal cannula oxygen Cardiovascular status: blood pressure returned to baseline and stable Postop Assessment: no apparent nausea or vomiting Anesthetic complications: no    Last Vitals:  Vitals:   05/17/17 1030 05/17/17 1045  BP:  (!) 142/80  Pulse: 86 92  Resp: 17 (!) 23  Temp:    SpO2: 96% 93%    Last Pain:  Vitals:   05/17/17 1045  TempSrc:   PainSc: Asleep                 [name]

## 2017-05-17 NOTE — Progress Notes (Signed)
Duoned Tx going at this time.

## 2017-05-17 NOTE — Anesthesia Procedure Notes (Addendum)
Procedure Name: LMA Insertion Date/Time: 05/17/2017 7:46 AM Performed by: Cynda Familia, CRNA Pre-anesthesia Checklist: Patient identified, Emergency Drugs available, Suction available and Patient being monitored Patient Re-evaluated:Patient Re-evaluated prior to induction Oxygen Delivery Method: Circle System Utilized Preoxygenation: Pre-oxygenation with 100% oxygen Induction Type: IV induction Ventilation: Mask ventilation without difficulty LMA: LMA inserted and LMA with gastric port inserted LMA Size: 4.0 Tube type: Oral (20 cc air) Number of attempts: 1 Placement Confirmation: positive ETCO2 Tube secured with: Tape Dental Injury: Teeth and Oropharynx as per pre-operative assessment  Comments: Smooth IV induction Glennon Mac MD -- LMA AM CRNA atraumatic teeth and mouth as preop-- many missing teeth--- poor mouth opening after induction -- bilat Bs Glennon Mac

## 2017-05-18 ENCOUNTER — Encounter (HOSPITAL_COMMUNITY): Payer: Self-pay | Admitting: Urology

## 2017-05-18 DIAGNOSIS — N390 Urinary tract infection, site not specified: Secondary | ICD-10-CM | POA: Diagnosis not present

## 2017-05-18 DIAGNOSIS — N200 Calculus of kidney: Secondary | ICD-10-CM

## 2017-05-18 DIAGNOSIS — R6510 Systemic inflammatory response syndrome (SIRS) of non-infectious origin without acute organ dysfunction: Secondary | ICD-10-CM

## 2017-05-18 LAB — COMPREHENSIVE METABOLIC PANEL
ALBUMIN: 2.6 g/dL — AB (ref 3.5–5.0)
ALT: <5 U/L — ABNORMAL LOW (ref 14–54)
AST: 16 U/L (ref 15–41)
Alkaline Phosphatase: 50 U/L (ref 38–126)
Anion gap: 10 (ref 5–15)
BUN: 22 mg/dL — AB (ref 6–20)
CHLORIDE: 104 mmol/L (ref 101–111)
CO2: 27 mmol/L (ref 22–32)
Calcium: 7.9 mg/dL — ABNORMAL LOW (ref 8.9–10.3)
Creatinine, Ser: 1.28 mg/dL — ABNORMAL HIGH (ref 0.44–1.00)
GFR calc Af Amer: 51 mL/min — ABNORMAL LOW (ref >60)
GFR calc non Af Amer: 44 mL/min — ABNORMAL LOW (ref >60)
GLUCOSE: 201 mg/dL — AB (ref 65–99)
POTASSIUM: 3.9 mmol/L (ref 3.5–5.1)
Sodium: 141 mmol/L (ref 135–145)
Total Bilirubin: 0.3 mg/dL (ref 0.3–1.2)
Total Protein: 6.2 g/dL — ABNORMAL LOW (ref 6.5–8.1)

## 2017-05-18 LAB — CBC WITH DIFFERENTIAL/PLATELET
Basophils Absolute: 0 10*3/uL (ref 0.0–0.1)
Basophils Relative: 0 %
EOS PCT: 1 %
Eosinophils Absolute: 0.1 10*3/uL (ref 0.0–0.7)
HCT: 34.4 % — ABNORMAL LOW (ref 36.0–46.0)
Hemoglobin: 10.3 g/dL — ABNORMAL LOW (ref 12.0–15.0)
LYMPHS ABS: 2.6 10*3/uL (ref 0.7–4.0)
LYMPHS PCT: 25 %
MCH: 29.3 pg (ref 26.0–34.0)
MCHC: 29.9 g/dL — ABNORMAL LOW (ref 30.0–36.0)
MCV: 97.7 fL (ref 78.0–100.0)
MONO ABS: 1.1 10*3/uL — AB (ref 0.1–1.0)
MONOS PCT: 11 %
Neutro Abs: 6.2 10*3/uL (ref 1.7–7.7)
Neutrophils Relative %: 63 %
PLATELETS: 109 10*3/uL — AB (ref 150–400)
RBC: 3.52 MIL/uL — ABNORMAL LOW (ref 3.87–5.11)
RDW: 15.9 % — AB (ref 11.5–15.5)
WBC: 10.1 10*3/uL (ref 4.0–10.5)

## 2017-05-18 LAB — GLUCOSE, CAPILLARY
GLUCOSE-CAPILLARY: 146 mg/dL — AB (ref 65–99)
Glucose-Capillary: 138 mg/dL — ABNORMAL HIGH (ref 65–99)

## 2017-05-18 LAB — LACTIC ACID, PLASMA
Lactic Acid, Venous: 2.2 mmol/L (ref 0.5–1.9)
Lactic Acid, Venous: 2.3 mmol/L (ref 0.5–1.9)

## 2017-05-18 MED ORDER — SODIUM CHLORIDE 0.9 % IV BOLUS (SEPSIS)
500.0000 mL | Freq: Once | INTRAVENOUS | Status: AC
  Administered 2017-05-18: 500 mL via INTRAVENOUS

## 2017-05-18 MED ORDER — SODIUM CHLORIDE 0.9 % IV SOLN
INTRAVENOUS | Status: DC
  Administered 2017-05-18 – 2017-05-20 (×3): via INTRAVENOUS

## 2017-05-18 MED ORDER — MEROPENEM 1 G IV SOLR
1.0000 g | Freq: Three times a day (TID) | INTRAVENOUS | Status: DC
  Administered 2017-05-18 – 2017-05-21 (×9): 1 g via INTRAVENOUS
  Filled 2017-05-18 (×11): qty 1

## 2017-05-18 NOTE — NC FL2 (Addendum)
[place] MEDICAID FL2 LEVEL OF CARE SCREENING TOOL     IDENTIFICATION  Patient Name: [name] Birthdate: 12-Nov-1955 Sex: female Admission Date (Current Location): 05/17/2017  [facility] and [place] Number:  Herbalist and Address:  [facility],  [address], [place]      Provider Number: [id number]  Attending Physician Name and Address:  Franchot Gallo, MD  Relative Name and Phone Number:  Ayanah Snader, spouse [phone]    Current Level of Care: Hospital Recommended Level of Care: [place] Prior Approval Number:    Date Approved/Denied:   PASRR Number: [id number]  Discharge Plan: SNF([facility])    Current Diagnoses: Patient Active Problem List   Diagnosis Date Noted  . Renal calculus 05/17/2017  . Severe sepsis with septic shock ([place]) 04/23/2017  . AKI (acute kidney injury) ([place]) 04/21/2017  . CVA (cerebral vascular accident) ([place]) 04/21/2017  . HTN (hypertension) 11/21/2012  . Lower urinary tract infectious disease 11/18/2012  . Sepsis ([place]) 11/18/2012  . Thrombocytopenia, unspecified ([place]) 07/28/2012  . Central sleep apnea 07/28/2012  . Hemiparesis affecting left side as late effect of cerebrovascular accident ([place]) 07/28/2012  . Hypothyroidism 07/28/2012  . Hypercapnia 07/28/2012  . Seizure disorder ([place]) 07/28/2012  . Narcotic overdose ([place]) 07/27/2012  . Acute respiratory failure ([place]) 07/27/2012  . Morbid obesity ([place]) 07/27/2012  . History of stroke 07/27/2012  . Chronic pain disorder 07/27/2012    Orientation RESPIRATION BLADDER Height & Weight     Self, Place  O2(2 liters) Continent Weight: 271 lb (122.9 kg) Height:  5\' 1"  (154.9 cm)  BEHAVIORAL SYMPTOMS/MOOD NEUROLOGICAL BOWEL NUTRITION STATUS      Incontinent Diet(Heart healthy)  AMBULATORY STATUS COMMUNICATION OF NEEDS Skin   Total Care Verbally Surgical wounds, Skin abrasions                       Personal Care  Assistance Level of Assistance  Total care Bathing Assistance: Maximum assistance Feeding assistance: Limited assistance Dressing Assistance: Maximum assistance Total Care Assistance: Maximum assistance   Functional Limitations Info  Sight, Hearing, Speech Sight Info: Adequate Hearing Info: Adequate Speech Info: Adequate    SPECIAL CARE FACTORS FREQUENCY   PT/OT     5x a week                Contractures Contractures Info: Not present    Additional Factors Info  Code Status, Isolation Precautions, Allergies Code Status Info: Full Allergies Info: Codeine, Lacosamide, Morphine, Zolpidem     Isolation Precautions Info: On contact ESBL     Current Medications (05/18/2017):  This is the current hospital active medication list Current Facility-Administered Medications  Medication Dose Route Frequency Provider Last Rate Last Dose  . 0.45 % sodium chloride infusion   Intravenous Continuous Franchot Gallo, MD 75 mL/hr at 05/18/17 1255    . acetaminophen (TYLENOL) tablet 650 mg  650 mg Oral Q4H PRN Franchot Gallo, MD      . amLODipine (NORVASC) tablet 10 mg  10 mg Oral Daily Franchot Gallo, MD   Stopped at 05/18/17 [phone]  . aspirin EC tablet 81 mg  81 mg Oral Daily Franchot Gallo, MD   81 mg at 05/18/17 0910  . cloNIDine (CATAPRES) tablet 0.1 mg  0.1 mg Oral Q6H PRN Franchot Gallo, MD      . divalproex (DEPAKOTE) DR tablet 125 mg  125 mg Oral BID Franchot Gallo, MD   125 mg at 05/18/17 0911  .  divalproex (DEPAKOTE) DR tablet 500 mg  500 mg Oral BID Franchot Gallo, MD   500 mg at 05/18/17 0910  . DULoxetine (CYMBALTA) DR capsule 60 mg  60 mg Oral Daily Franchot Gallo, MD   60 mg at 05/17/17 1504  . enoxaparin (LOVENOX) injection 40 mg  40 mg Subcutaneous Q24H Franchot Gallo, MD   40 mg at 05/17/17 2306  . furosemide (LASIX) tablet 20 mg  20 mg Oral BID Franchot Gallo, MD   Stopped at 05/18/17 [phone]  . labetalol (NORMODYNE) tablet 200 mg  200 mg  Oral BID Franchot Gallo, MD   200 mg at 05/17/17 1504  . lactulose (CHRONULAC) 10 GM/15ML solution 20 g  20 g Oral Daily PRN Franchot Gallo, MD      . levETIRAcetam (KEPPRA) tablet 1,000 mg  1,000 mg Oral Daily Franchot Gallo, MD   1,000 mg at 05/18/17 4665   And  . levETIRAcetam (KEPPRA) tablet 1,500 mg  1,500 mg Oral QHS Franchot Gallo, MD   1,500 mg at 05/17/17 2311  . levothyroxine (SYNTHROID, LEVOTHROID) tablet 100 mcg  100 mcg Oral QAC breakfast Franchot Gallo, MD   100 mcg at 05/18/17 0910  . lidocaine (LIDODERM) 5 % 1 patch  1 patch Transdermal Q24H Franchot Gallo, MD   1 patch at 05/18/17 [phone]  . loratadine (CLARITIN) tablet 10 mg  10 mg Oral Daily PRN Franchot Gallo, MD      . losartan (COZAAR) tablet 50 mg  50 mg Oral QHS Franchot Gallo, MD      . meropenem (MERREM) 1 g in sodium chloride 0.9 % 100 mL IVPB  1 g Intravenous Q8H Green, Terri L, RPH 200 mL/hr at 05/18/17 1255 1 g at 05/18/17 1255  . olopatadine (PATANOL) 0.1 % ophthalmic solution 1 drop  1 drop Both Eyes QHS Franchot Gallo, MD   1 drop at 05/17/17 2303  . ondansetron (ZOFRAN) injection 4 mg  4 mg Intravenous Q4H PRN Franchot Gallo, MD      . oxyCODONE (Oxy IR/ROXICODONE) immediate release tablet 5 mg  5 mg Oral Q4H PRN Franchot Gallo, MD   5 mg at 05/18/17 7017  . pantoprazole (PROTONIX) EC tablet 40 mg  40 mg Oral Daily Franchot Gallo, MD   40 mg at 05/18/17 0910  . polyethylene glycol (MIRALAX / GLYCOLAX) packet 17 g  17 g Oral Daily Franchot Gallo, MD   17 g at 05/18/17 0909  . polyvinyl alcohol (LIQUIFILM TEARS) 1.4 % ophthalmic solution 1 drop  1 drop Both Eyes TID PRN Franchot Gallo, MD      . rosuvastatin (CRESTOR) tablet 5 mg  5 mg Oral QHS Franchot Gallo, MD   5 mg at 05/17/17 2311  . traZODone (DESYREL) tablet 50 mg  50 mg Oral QHS Franchot Gallo, MD         Discharge Medications: Please see discharge summary for a list of discharge  medications.  Relevant Imaging Results:  Relevant Lab Results:   Additional Information [id number]  Nickie Retort Work [phone]

## 2017-05-18 NOTE — Care Management Note (Signed)
Case Management Note  Patient Details  Name: [name] MRN: [id number] Date of Birth: Feb 08, 1956  Subjective/Objective: 62 y/o f admitted w/renal calculus. From LTC-CSW already following for return.                   Action/Plan:d/c back to LTC   Expected Discharge Date:                  Expected Discharge Plan:     In-House Referral:  Clinical Social Work  Discharge planning Services  CM Consult  Post Acute Care Choice:    Choice offered to:     DME Arranged:    DME Agency:     HH Arranged:    HH Agency:     Status of Service:  In process, will continue to follow  If discussed at Long Length of Stay Meetings, dates discussed:    Additional Comments:  [name], RN 05/18/2017, 1:25 PM

## 2017-05-18 NOTE — Care Management Obs Status (Signed)
[facility] NOTIFICATION   Patient Details  Name: JETTA MURRAY MRN: [id number] Date of Birth: 1955/07/11   Medicare Observation Status Notification Given:  Yes    MahabirJuliann Pulse, RN 05/18/2017, 1:21 PM

## 2017-05-18 NOTE — Clinical Social Work Note (Cosign Needed)
05/18/2017 1:41 PM CSW intern met with patient via bedside to discuss discharge planning. An assessment was done on 04/27/17. Patient is from [facility], and plans to return back to [facility] CSW intern has left a voice message with the director of admissions Larene Beach, [phone]) and is awaiting phone call. CSW intern has completed an FL2.  Clinical Social Work Assessment  Patient Details  Name: [name] MRN: [id number] Date of Birth: 1955/07/02  Date of referral:  04/27/17               Reason for consult:  Facility Placement                 Permission sought to share information with:  Facility Sport and exercise psychologist, Family Supports Permission granted to share information::  Yes, Verbal Permission Granted             Name::     [name]             Agency::  [facility]             Relationship::  Daughter              Contact Information:  [phone]  Housing/Transportation Living arrangements for the past 2 months:  [place] of Information:  Patient Patient Interpreter Needed:  None Criminal Activity/Legal Involvement Pertinent to Current Situation/Hospitalization:  No - Comment as needed Significant Relationships:  Adult Children, Siblings Lives with:  Facility Resident Do you feel safe going back to the place where you live?  Yes Need for family participation in patient care:  No (Coment)  Care giving concerns:  Patient from [facility] for long term care. Patient reported that she has been at [facility] for the past 9 years and that it is going okay. Patient reported that she plans to return to [facility].    Social Worker assessment / plan:  CSW spoke with patient at bedside regarding discharge planning. Patient verbalized plan to discharge back to SNF. Patient reported that she gets often visits from her sister and daughter. CSW agreed to follow up with SNF.  CSW contacted [facility] and spoke with  staff member Larene Beach. Staff confirmed patient's ability to return.  CSW completed FL2 and will continue to follow and assist with discharge planning.  Employment status:  Disabled (Comment on whether or not currently receiving Disability) Insurance information:  Managed Medicare PT Recommendations:  Not assessed at this time Information / Referral to community resources:  Child psychotherapist from [facility])  Patient/Family's Response to care:  Patient appreciative of CSW assistance with discharge planning.  Patient/Family's Understanding of and Emotional Response to Diagnosis, Current Treatment, and Prognosis:  Patient presented pleasant and remained engaged throughout the duration of the assessment. Patient verbalized understanding of diagnosis and current treatment. 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   Emotional Assessment Appearance:  Appears stated age Attitude/Demeanor/Rapport:  Engaged Affect (typically observed):  Pleasant Orientation:  Oriented to Self, Oriented to Place, Oriented to  Time, Oriented to Situation Alcohol / Substance use:  Not Applicable Psych involvement (Current and /or in the community):  No (Comment)  Discharge Needs  Concerns to be addressed:  Care Coordination Readmission within the last 30 days:  No Current discharge risk:  Dependent with Mobility Barriers to Discharge:  No Barriers Identified   Burnis Medin, LCSW 04/27/2017, 1:56  PM

## 2017-05-18 NOTE — Progress Notes (Signed)
Tonight, when patient's BP was checked it showed 78/45. On call notified and they stated to recheck BP manually but to go ahead and order a 1L bolus. Manually BP was 90/42. After bolus was finished , BP rechecked and it was 95/44. HR in the 70's.  Will continue to monitor.

## 2017-05-18 NOTE — Progress Notes (Addendum)
Pharmacy Antibiotic Note  [name] is a 62 y.o. female admitted on 05/17/2017 with recurrent renal stone.  Pharmacy has been consulted for Meropenem dosing.  S/p Cysto, R stone removal, replacement of bilateral stents. UCx > 100K GNR. Prev admit 2/14-2/20, Urosepsis with Ecoli, UCx ESBL  Plan: Meropenem 1gm q8hr Note Meropenem may decrease Valproic acid levels, patient on Depakote DR  Height: 5\' 1"  (154.9 cm) Weight: 271 lb (122.9 kg) IBW/kg (Calculated) : 47.8  Temp (24hrs), Avg:99.1 F (37.3 C), Min:98.2 F (36.8 C), Max:99.5 F (37.5 C)  Recent Labs  Lab 05/17/17 0630  WBC 5.6  CREATININE 0.84    Estimated Creatinine Clearance: 86.4 mL/min (by C-G formula based on SCr of 0.84 mg/dL).    Allergies  Allergen Reactions  . Codeine Other (See Comments)    unknown  . Lacosamide Other (See Comments)    Mood changes/agitation No rx noted    . Morphine Other (See Comments)    No rx noted    . Zolpidem    Antimicrobials this admission: 3/11 Ancef >> 312 3/12 Meropenem >>   Microbiology results: 3/11 UCx: > 100K GNR  Previous cultures: 2/14 BCx: ESBL E coli 2/14 UCx: ESBL E coli   Thank you for allowing pharmacy to be a part of this patient's care.  Minda Ditto PharmD Pager [phone] 05/18/2017, 11:20 AM

## 2017-05-18 NOTE — Progress Notes (Signed)
1 Day Post-Op  Subjective: [name] is postop day 1 from right ureteroscopy with laser and stenting for a right renal stone with a history of sepsis.  She has bilateral stents with a plan to do left ureteroscopy in the future.  She is lethargic this morning and has some abdominal pain that is diffuse.   Her BP has been soft and she had a bolus in the night.  She has a low grade temp.   ROS:  Review of Systems  All other systems reviewed and are negative.   Anti-infectives: Anti-infectives (From admission, onward)   Start     Dose/Rate Route Frequency Ordered Stop   05/17/17 1600  ceFAZolin (ANCEF) IVPB 1 g/50 mL premix  Status:  Discontinued     1 g 100 mL/hr over 30 Minutes Intravenous Every 8 hours 05/17/17 1239 05/18/17 1047   05/17/17 0600  ceFAZolin (ANCEF) 3 g in dextrose 5 % 50 mL IVPB     3 g 130 mL/hr over 30 Minutes Intravenous 30 min pre-op 05/16/17 1013 05/17/17 0818      Current Facility-Administered Medications  Medication Dose Route Frequency Provider Last Rate Last Dose  . 0.45 % sodium chloride infusion   Intravenous Continuous Franchot Gallo, MD 75 mL/hr at 05/18/17 [phone]    . acetaminophen (TYLENOL) tablet 650 mg  650 mg Oral Q4H PRN Franchot Gallo, MD      . amLODipine (NORVASC) tablet 10 mg  10 mg Oral Daily Franchot Gallo, MD   Stopped at 05/18/17 [phone]  . aspirin EC tablet 81 mg  81 mg Oral Daily Franchot Gallo, MD   81 mg at 05/18/17 0910  . cloNIDine (CATAPRES) tablet 0.1 mg  0.1 mg Oral Q6H PRN Franchot Gallo, MD      . divalproex (DEPAKOTE) DR tablet 125 mg  125 mg Oral BID Franchot Gallo, MD   125 mg at 05/18/17 0911  . divalproex (DEPAKOTE) DR tablet 500 mg  500 mg Oral BID Franchot Gallo, MD   500 mg at 05/18/17 0910  . DULoxetine (CYMBALTA) DR capsule 60 mg  60 mg Oral Daily Franchot Gallo, MD   60 mg at 05/17/17 1504  . enoxaparin (LOVENOX) injection 40 mg  40 mg Subcutaneous Q24H Franchot Gallo, MD   40 mg at 05/17/17  2306  . furosemide (LASIX) tablet 20 mg  20 mg Oral BID Franchot Gallo, MD   Stopped at 05/18/17 [phone]  . labetalol (NORMODYNE) tablet 200 mg  200 mg Oral BID Franchot Gallo, MD   200 mg at 05/17/17 1504  . lactulose (CHRONULAC) 10 GM/15ML solution 20 g  20 g Oral Daily PRN Franchot Gallo, MD      . levETIRAcetam (KEPPRA) tablet 1,000 mg  1,000 mg Oral Daily Franchot Gallo, MD   1,000 mg at 05/18/17 4627   And  . levETIRAcetam (KEPPRA) tablet 1,500 mg  1,500 mg Oral QHS Franchot Gallo, MD   1,500 mg at 05/17/17 2311  . levothyroxine (SYNTHROID, LEVOTHROID) tablet 100 mcg  100 mcg Oral QAC breakfast Franchot Gallo, MD   100 mcg at 05/18/17 0910  . lidocaine (LIDODERM) 5 % 1 patch  1 patch Transdermal Q24H Franchot Gallo, MD   1 patch at 05/18/17 [phone]  . loratadine (CLARITIN) tablet 10 mg  10 mg Oral Daily PRN Franchot Gallo, MD      . losartan (COZAAR) tablet 50 mg  50 mg Oral QHS Franchot Gallo, MD      . olopatadine (PATANOL) 0.1 %  ophthalmic solution 1 drop  1 drop Both Eyes QHS Franchot Gallo, MD   1 drop at 05/17/17 2303  . ondansetron (ZOFRAN) injection 4 mg  4 mg Intravenous Q4H PRN Franchot Gallo, MD      . oxyCODONE (Oxy IR/ROXICODONE) immediate release tablet 5 mg  5 mg Oral Q4H PRN Franchot Gallo, MD   5 mg at 05/18/17 5885  . pantoprazole (PROTONIX) EC tablet 40 mg  40 mg Oral Daily Franchot Gallo, MD   40 mg at 05/18/17 0910  . polyethylene glycol (MIRALAX / GLYCOLAX) packet 17 g  17 g Oral Daily Franchot Gallo, MD   17 g at 05/18/17 0909  . polyvinyl alcohol (LIQUIFILM TEARS) 1.4 % ophthalmic solution 1 drop  1 drop Both Eyes TID PRN Franchot Gallo, MD      . rosuvastatin (CRESTOR) tablet 5 mg  5 mg Oral QHS Franchot Gallo, MD   5 mg at 05/17/17 2311  . traZODone (DESYREL) tablet 50 mg  50 mg Oral QHS Franchot Gallo, MD         Objective: Vital signs in last 24 hours: Temp:  [98.2 F (36.8 C)-99.5 F (37.5 C)]  99.3 F (37.4 C) (03/12 0549) Pulse Rate:  [79-103] 81 (03/12 0911) Resp:  [13-31] 14 (03/12 0549) BP: (78-128)/(42-79) 82/46 (03/12 0915) SpO2:  [90 %-98 %] 98 % (03/12 0915)  Intake/Output from previous day: 03/11 0701 - 03/12 0700 In: 2405.8 [P.O.:60; I.V.:2245.8; IV Piggyback:100] Out: 501 [Urine:500; Stool:1] Intake/Output this shift: Total I/O In: 240 [P.O.:240] Out: -    Physical Exam  Constitutional:  WD, Obese WF in NAD but lethargic and slow to arouse.   Cardiovascular: Normal rate, regular rhythm and normal heart sounds.  Pulmonary/Chest: Effort normal and breath sounds normal. No respiratory distress.  Abdominal: Soft. There is tenderness (mild diffuse.).    Lab Results:  Recent Labs    05/17/17 0630  WBC 5.6  HGB 11.7*  HCT 37.9  PLT 149*   BMET Recent Labs    05/17/17 0630  NA 143  K 3.9  CL 104  CO2 31  GLUCOSE 110*  BUN 18  CREATININE 0.84  CALCIUM 8.9   PT/INR No results for input(s): LABPROT, INR in the last 72 hours. ABG No results for input(s): PHART, HCO3 in the last 72 hours.  Invalid input(s): PCO2, PO2  Studies/Results: Dg C-arm 1-60 Min-no Report  Result Date: 05/17/2017 Fluoroscopy was utilized by the requesting physician.  No radiographic interpretation.     Assessment and Plan: S/P ureteroscopy with hypotension and low grade fever.  Her prior culture was resistant to Ancef.   I am going check labs and cultures and change her to Meropenem based on the prior culture.  I will have her get another bolus of 539ml NS.       LOS: 0 days    Irine Seal 05/18/2017 [phone] ID: [name], female   DOB: 1955-06-19, 62 y.o.   MRN: [id number]

## 2017-05-18 NOTE — Progress Notes (Signed)
Patient BP 82/46 manually. Dr. Diona Fanti notified and ordered a 500cc bolus and increased her maintenance IV fluid rate. Hold her BP medications. Will continue to monitor.

## 2017-05-18 NOTE — Consult Note (Addendum)
[facility]                                                                                    Patient Demographics  [name], is a 62 y.o. female  CSN: [id number]  MRN: [id number]  DOB - 01-Jun-1955  Admit Date - 05/17/2017  Outpatient Primary MD for the patient is Hilbert Corrigan, MD   With History of -  Past Medical History:  Diagnosis Date  . Acute kidney failure (HCC)    hx of   . Acute respiratory failure ([place]) 07/27/2012  . Arthritis    OSTEO LEFT LEG  . Bilateral cataracts   . Candidiasis of skin and nail   . Chronic pain   . Chronic pain syndrome   . Constipation   . Contractures involving both knees    and ankles   . CVA (cerebral vascular accident) ([place])    left sided hemiparesis  . Depression   . Diabetes mellitus without complication ([place])    type II  . Dry eye syndrome of bilateral lacrimal glands   . Dysphagia   . Feeding difficulties   . 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 GERD (gastroesophageal reflux disease)   . Hemiparesis affecting left side as late effect of cerebrovascular accident ([place])   . History of kidney stones   . Hyperlipidemia   . Hypertension   . Metabolic encephalopathy 61/60/7371  . Morbid obesity ([place])   . Muscle weakness (generalized)   . Narcotic overdose ([place]) 07/27/2012  . Parkinson's disease ([place])   . Peripheral vascular disease ([place])   . Polymyalgia rheumatica ([place])   . Primary insomnia   . Respiratory failure ([place]) 05/06/10  . Seizure disorder ([place])   . Sepsis ([place])    hx of   . Sleep apnea 04/2010   on CPAP, "severe central sleep apnea"  . Spasticity    chronic  . 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 Thrombocytopenia ([place])   . Tinea unguium   . Unspecified hypothyroidism   . 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 Urinary incontinence   . UTI (lower urinary tract infection) 05/06/10      Past  Surgical History:  Procedure Laterality Date  . CEREBRAL ANEURYSM REPAIR  1999  . CYSTOSCOPY W/ URETERAL STENT PLACEMENT Bilateral 04/22/2017   Procedure: CYSTOSCOPY WITH BILATERAL  RETROGRADE PYELOGRAM, RIGHT URETERAL STENT PLACEMENT;  Surgeon: Festus Aloe, MD;  Location: [facility];  Service: Urology;  Laterality: Bilateral;  . CYSTOSCOPY W/ URETERAL STENT REMOVAL Right 05/17/2017   Procedure: CYSTOSCOPY WITH STENT REMOVAL;  Surgeon: Franchot Gallo, MD;  Location: [facility];  Service: Urology;  Laterality: Right;  . CYSTOSCOPY/URETEROSCOPY/HOLMIUM LASER/STENT PLACEMENT Right 05/17/2017   Procedure: CYSTOSCOPY/URETEROSCOPY/HOLMIUM LASER/STENT PLACEMENT/BILATERAL STENT PLACEMENTS ,RIGHT STONE EXTRACTION;  Surgeon: Franchot Gallo, MD;  Location: [facility];  Service: Urology;  Laterality: Right;  . INTRATHECAL PUMP IMPLANTATION  2010   [employer]:  fentanyl, baclofen changed to morphine and baclofen on 07/2012    in for   No chief complaint on file.    HPI  Rita  [name]  is a 63 y.o. female, with past medical history significant for right UPJ stone with obstruction stented urgently on 2/14 who was admitted for ureteroscopy, laser and extraction of the right renal stone as well as stent placement on the left in anticipation of eventual ureteroscopy.  The patient underwent the procedure, however she started having fever and hypotension.  Urine culture grew E. coli, knowing that she has a history of ESBL UTI and sepsis 04/22/2017.  Patient was started on meropenem.  Urine culture sensitivities are still pending for this admission.  I saw the patient today and she was drowsy.  She has a history of organic brain syndrome status post CVA, with severe nondominant hemisphere stroke and dense length hemiparesis and neglect .  Patient also has seizure disorder, history of sleep apnea, hypertension and hypothyroidism.  She has history of slurred speech and altered mental status.  However when her daughter  entered the room the patient started asking questions and talking.  She asked me when is she going to go home!?    Review of Systems    In addition to the HPI above, No Headache, No problems swallowing food or Liquids, No Chest pain, Cough or Shortness of Breath,  No Nausea or Vommitting,  No dysuria, No new skin rashes or bruises, No new joints pains-aches,  No new weakness, tingling, numbness in any extremity,   A full 10 point Review of Systems was done, except as stated above, all other Review of Systems were negative.   Social History Social History   Tobacco Use  . Smoking status: Never Smoker  . Smokeless tobacco: Never Used  Substance Use Topics  . Alcohol use: No     Family History History reviewed. No pertinent family history.   Prior to Admission medications   Medication Sig Start Date End Date Taking? Authorizing Provider  acetaminophen (TYLENOL) 325 MG tablet Take 2 tablets (650 mg total) by mouth every 4 (four) hours as needed for mild pain (or temp > 37.5 C (99.5 F)). 04/28/17  Yes Emokpae, Courage, MD  amLODipine (NORVASC) 10 MG tablet Take 1 tablet (10 mg total) by mouth daily. Patient taking differently: Take 10 mg by mouth daily.  04/29/17  Yes Emokpae, Courage, MD  aspirin EC 81 MG tablet Take 81 mg by mouth daily.   Yes [provider]  cloNIDine (CATAPRES) 0.1 MG tablet Take 0.1 mg by mouth every 6 (six) hours as needed (for SBP greater than 160).   Yes [provider]  divalproex (DEPAKOTE) 125 MG DR tablet Take 125 mg by mouth 2 (two) times daily.   Yes [provider]  divalproex (DEPAKOTE) 500 MG DR tablet Take 500 mg by mouth 2 (two) times daily.   Yes [provider]  DULoxetine (CYMBALTA) 20 MG capsule Take 20 mg by mouth daily.   Yes [provider]  DULoxetine (CYMBALTA) 60 MG capsule Take 60 mg by mouth daily.   Yes [provider]  furosemide (LASIX) 20 MG tablet Take 20 mg by mouth 2  (two) times daily.   Yes [provider]  labetalol (NORMODYNE) 200 MG tablet Take 200 mg by mouth 2 (two) times daily.   Yes [provider]  lactulose, encephalopathy, (CHRONULAC) 10 GM/15ML SOLN Take 20 g by mouth daily.    Yes [provider]  levETIRAcetam (KEPPRA) 500 MG tablet Take 1,500 mg by mouth 2 (two) times daily.    Yes [provider]  levothyroxine (  SYNTHROID, LEVOTHROID) 100 MCG tablet Take 100 mcg by mouth daily before breakfast.   Yes [provider]  lidocaine (LIDODERM) 5 % Place 1 patch onto the skin daily. Remove & Discard patch within 12 hours or as directed by MD   Yes [provider]  loratadine (CLARITIN) 10 MG tablet Take 10 mg by mouth daily.   Yes [provider]  losartan (COZAAR) 50 MG tablet Take 50 mg by mouth at bedtime.   Yes [provider]  Menthol, Topical Analgesic, 5 % GEL Apply 1 application topically 4 (four) times daily as needed (for pain).    Yes [provider]  Multiple Vitamin (MULTIVITAMIN WITH MINERALS) TABS tablet Take 1 tablet by mouth daily.   Yes [provider]  Olopatadine HCl (PATADAY) 0.2 % SOLN Place 1 drop into both eyes at bedtime.   Yes [provider]  omeprazole (PRILOSEC) 20 MG capsule Take 20 mg by mouth daily.   Yes [provider]  oxyCODONE (OXY IR/ROXICODONE) 5 MG immediate release tablet Take 1 tablet (5 mg total) by mouth every 8 (eight) hours as needed. 04/28/17  Yes Emokpae, Courage, MD  Polyethyl Glycol-Propyl Glycol (SYSTANE) 0.4-0.3 % SOLN Place 1 drop into both eyes 3 (three) times daily as needed.   Yes [provider]  polyethylene glycol (MIRALAX / GLYCOLAX) packet Take 17 g by mouth daily.   Yes [provider]  rosuvastatin (CRESTOR) 5 MG tablet Take 5 mg by mouth at bedtime.   Yes [provider]  traZODone (DESYREL) 50 MG tablet Take 50 mg by mouth at bedtime.   Yes [provider]  Vitamin D, Ergocalciferol, (DRISDOL) 50000 units CAPS capsule Take 50,000 Units by mouth every 30 (thirty) days.   Yes [provider]    Allergies  Allergen Reactions  . Codeine Other (See Comments)    unknown  . Lacosamide Other (See Comments)    Mood changes/agitation No rx noted    . Morphine Other (See Comments)    No rx noted    . Zolpidem     Physical Exam  Vitals  Blood pressure (!) 96/47, pulse 95, temperature (!) 97.3 F (36.3 C), temperature source Oral, resp. rate 14, height 5\' 1"  (1.549 m), weight 122.9 kg (271 lb), SpO2 97 %.   1. General obese female lying in bed, mental status was back and forth.  2.  Flat affect and insight, Not Suicidal or Homicidal, Awake confused.  3.  Could not assess neurologically since patient was slightly confused.  4.  Eyes roving, moist mucosa.  5. Supple Neck, No JVD, No cervical lymphadenopathy appriciated, No Carotid Bruits.  6. Symmetrical Chest wall movement, Good air movement bilaterally, CTAB.  7. RRR, No Gallops, Rubs or Murmurs, No Parasternal Heave.  8. Positive Bowel Sounds, Abdomen Soft, Non tender, No organomegaly appriciated,No rebound -guarding or rigidity.  9.  No Cyanosis, Normal Skin Turgor, No Skin Rash or Bruise.  10.  Regional muscle tone,.    Data Review  CBC Recent Labs  Lab 05/17/17 0630 05/18/17 1158  WBC 5.6 10.1  HGB 11.7* 10.3*  HCT 37.9 34.4*  PLT 149* 109*  MCV 96.7 97.7  MCH 29.8 29.3  MCHC 30.9 29.9*  RDW 15.8* 15.9*  LYMPHSABS  --  2.6  MONOABS  --  1.1*  EOSABS  --  0.1  BASOSABS  --  0.0   ------------------------------------------------------------------------------------------------------------------  Chemistries  Recent Labs  Lab 05/17/17 0630 05/18/17 1158  NA 143 141  K 3.9 3.9  CL 104 104  CO2 31 27  GLUCOSE 110* 201*  BUN 18 22*  CREATININE 0.84 1.28*  CALCIUM 8.9 7.9*  AST  --  16  ALT  --  <5*  ALKPHOS  --  50  BILITOT   --  0.3   ------------------------------------------------------------------------------------------------------------------ estimated creatinine clearance is 56.7 mL/min (A) (by C-G formula based on SCr of 1.28 mg/dL (H)). ------------------------------------------------------------------------------------------------------------------ No results for input(s): TSH, T4TOTAL, T3FREE, THYROIDAB in the last 72 hours.  Invalid input(s): FREET3   Coagulation profile No results for input(s): INR, PROTIME in the last 168 hours. ------------------------------------------------------------------------------------------------------------------- No results for input(s): DDIMER in the last 72 hours. -------------------------------------------------------------------------------------------------------------------  Cardiac Enzymes No results for input(s): CKMB, TROPONINI, MYOGLOBIN in the last 168 hours.  Invalid input(s): CK ------------------------------------------------------------------------------------------------------------------ Invalid input(s): POCBNP   ---------------------------------------------------------------------------------------------------------------  Urinalysis    Component Value Date/Time   COLORURINE BROWN (A) 04/22/2017 0956   APPEARANCEUR TURBID (A) 04/22/2017 0956   LABSPEC 1.025 04/22/2017 0956   PHURINE 5.0 04/22/2017 0956   GLUCOSEU NEGATIVE 04/22/2017 0956   HGBUR LARGE (A) 04/22/2017 0956   BILIRUBINUR MODERATE (A) 04/22/2017 0956   KETONESUR TRACE (A) 04/22/2017 0956   PROTEINUR >300 (A) 04/22/2017 0956   UROBILINOGEN 1.0 11/19/2012 1033   NITRITE POSITIVE (A) 04/22/2017 0956   LEUKOCYTESUR MODERATE (A) 04/22/2017 0956    ----------------------------------------------------------------------------------------------------------------     Imaging results:   Ct Abdomen Pelvis Wo Contrast  Result Date: 04/22/2017 CLINICAL DATA:  Recurrent  urinary tract infection. Diabetes. Hydronephrosis seen on recent ultrasound. EXAM: CT ABDOMEN AND PELVIS WITHOUT CONTRAST TECHNIQUE: Multidetector CT imaging of the abdomen and pelvis was performed following the standard protocol without IV contrast. COMPARISON:  04/22/2017 FINDINGS: Despite efforts by the technologist and patient, motion artifact is present on today's exam and could not be eliminated. This reduces exam sensitivity and specificity. Lower chest: Mild cardiomegaly with small bilateral pleural effusions and passive atelectasis in the lung bases. Hepatobiliary: Dependent density in the gallbladder could be sludge or gallstones. Otherwise unremarkable. Pancreas: Unremarkable Spleen: Unremarkable Adrenals/Urinary Tract: The adrenal glands appear normal. There is abnormal bilateral delayed nephrogram with contrast medium in both collecting systems, ureters, and the mostly empty urinary bladder (Foley catheter in place). There is right hydronephrosis along with asymmetric abnormal stranding around the right renal collecting system. Patchy variable contrast in the left kidney noted. With the use of bone window in, the contrast in the renal collecting systems can be somewhat differentiated from the calculi. There is a central left renal collecting system calculus measuring 1.3 by 0.9 cm on image 40/2. Multiple nonobstructive left renal collecting system calculi are shown for example on image 33/2 using bone windows. Multiple clustered calcifications in the left kidney lower pole are also present. Particularly on the left side. Stomach/Bowel: There is an unusual appearance of the rectum, with marked wall thickening in the lower rectum circumferentially, and a patulous anus. Vascular/Lymphatic: Scattered small retroperitoneal lymph nodes are present. These may be reactive. Aortoiliac atherosclerotic vascular disease. Reproductive: Multiple uterine fibroids, subserosal and intramural, similar contour to the  prior exam. Other: No supplemental non-categorized findings. Musculoskeletal: High density along the margins of the inferior thecal sac on image 64/6, possibly calcification or contrast, less likely blood products. An intrathecal pump is present. IMPRESSION: 1. Mild right hydronephrosis related to a 1.3 by 0.9 cm right collecting system stone which is likely mildly obstructing the right UPJ. 2. Abnormal delayed nephrograms compatible with acute renal injury.  This contrast was administered yesterday and is still in the kidneys. There is also some excreted contrast in the collecting systems and ureters. 3. Greater degree of heterogeneity in the left kidney than the right with regard to the contrast distribution and appearance, along with left perirenal stranding somewhat greater than the right. I cannot exclude pyelonephritis given this appearance, but the appearance is not considered specific given the difference in degree of obstruction and the overall unusual situation of renal contrast 1 day after administration. 4. Mild cardiomegaly with bilateral pleural effusions. 5. Abnormal appearance of the rectum with considerable distal rectal wall thickening and patulous anus. 6. There is high density in the thecal sac inferiorly, probably calcification. 7. Other imaging findings of potential clinical significance: Uterine fibroids. Possible gallstones. Aortic Atherosclerosis (ICD10-I70.0). Electronically Signed   By: Van Clines M.D.   On: 04/22/2017 17:06   Ct Angio Head W Or Wo Contrast  Result Date: 04/21/2017 CLINICAL DATA:  Altered mental status.  Abnormal speech. EXAM: CT ANGIOGRAPHY HEAD AND NECK CT PERFUSION BRAIN TECHNIQUE: Multidetector CT imaging of the head and neck was performed using the standard protocol during bolus administration of intravenous contrast. Multiplanar CT image reconstructions and MIPs were obtained to evaluate the vascular anatomy. Carotid stenosis measurements (when applicable)  are obtained utilizing NASCET criteria, using the distal internal carotid diameter as the denominator. Multiphase CT imaging of the brain was performed following IV bolus contrast injection. Subsequent parametric perfusion maps were calculated using RAPID software. CONTRAST:  68mL ISOVUE-370 IOPAMIDOL (ISOVUE-370) INJECTION 76% COMPARISON:  Noncontrast head CT today. No prior angiographic imaging. FINDINGS: CTA NECK FINDINGS Aortic arch: Standard 3 vessel aortic arch with mild atherosclerotic plaque. Widely patent arch vessel origins. Right carotid system: Patent without evidence of stenosis or dissection. Left carotid system: Patent without evidence of stenosis or dissection. Vertebral arteries: Patent and codominant without evidence of dissection or hemodynamically significant stenosis. Possible mild left vertebral artery origin stenosis, with motion artifact limiting assessment. Skeleton: Right anterior pterional craniotomy. Moderate cervical disc and facet degeneration. Other neck: No mass or enlarged lymph nodes. Upper chest: Motion artifact in the lung apices with posterior right upper lobe opacity likely reflecting atelectasis. Review of the MIP images confirms the above findings CTA HEAD FINDINGS Anterior circulation: The internal carotid arteries are widely patent from skull base to carotid termini. An aneurysm clip is present in the anterior communicating region with associated streak artifact. The ACAs are patent, with the right A1 segment being hypoplastic. No recurrent/residual aneurysm is identified. The MCAs are patent without evidence of proximal branch occlusion or significant proximal stenosis. There is diffuse asymmetric attenuation of distal branch vessels throughout the right MCA territory. Posterior circulation: The intracranial vertebral arteries are widely patent to the basilar. Dominant right PICA and left AICA. Patent SCAs. The basilar artery is widely patent. There is a patent right  posterior communicating artery, and the right P1 segment appears hypoplastic. The PCAs are patent without evidence of significant stenosis. No aneurysm. Venous sinuses: Patent. Anatomic variants: Hypoplastic right A1 and right P1 segments. Delayed phase: No abnormal enhancement. Review of the MIP images confirms the above findings CT Brain Perfusion Findings: There is prominent motion artifact in the mid to latter part of the acquisition. CBF (<30%) Volume: 0 mL Perfusion (Tmax>6.0s) volume: 136 mL, however this includes portions of the posterior fossa and posterior left cerebral hemisphere which are favored to be artifactual. The majority of the prolonged transit involves the right cerebral hemisphere peripheral to the  large area of chronic encephalomalacia. Infarction Location: None based on a threshold of CBF <30% of the contralateral side, although there is focal less severely reduced CBF (<38%) in the high right frontoparietal region IMPRESSION: 1. No emergent large vessel occlusion. 2. 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 Diffuse attenuation of distal right MCA branch vessels with prolonged transit throughout the right hemisphere peripheral to the encephalomalacia, likely chronic. No definite acute core infarct based on motion degraded perfusion imaging. 3. No significant proximal intracranial arterial stenosis. 4. Widely patent cervical carotid arteries. 5. 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 These results were called by telephone at the time of interpretation on 04/21/2017 at 12:36 pm to Dr. Thurnell Garbe, who verbally acknowledged these results. Electronically Signed   By: Logan Bores M.D.   On: 04/21/2017 13:09   Ct Head Wo Contrast  Result Date: 04/23/2017 CLINICAL DATA:  Altered level consciousness EXAM: CT HEAD WITHOUT CONTRAST TECHNIQUE: Contiguous axial images were obtained from the base of the skull  through the vertex without intravenous contrast. COMPARISON:  None. FINDINGS: Brain: No evidence of acute infarction, hemorrhage, extra-axial collection, ventriculomegaly, or mass effect. Old, large right MCA territory infarct with encephalomalacia. Generalized cerebral atrophy. Periventricular white matter low attenuation likely secondary to microangiopathy. Vascular: Cerebrovascular atherosclerotic calcifications are noted. 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 Skull: Negative for fracture or focal lesion. Sinuses/Orbits: Visualized portions of the orbits are unremarkable. Visualized portions of the paranasal sinuses and mastoid air cells are unremarkable. Other: None. IMPRESSION: 1. No acute intracranial pathology. 2. Old right MCA territory infarct with encephalomalacia. Electronically Signed   By: Kathreen Devoid   On: 04/23/2017 13:16   Ct Angio Neck W Or Wo Contrast  Result Date: 04/21/2017 CLINICAL DATA:  Altered mental status.  Abnormal speech. EXAM: CT ANGIOGRAPHY HEAD AND NECK CT PERFUSION BRAIN TECHNIQUE: Multidetector CT imaging of the head and neck was performed using the standard protocol during bolus administration of intravenous contrast. Multiplanar CT image reconstructions and MIPs were obtained to evaluate the vascular anatomy. Carotid stenosis measurements (when applicable) are obtained utilizing NASCET criteria, using the distal internal carotid diameter as the denominator. Multiphase CT imaging of the brain was performed following IV bolus contrast injection. Subsequent parametric perfusion maps were calculated using RAPID software. CONTRAST:  80mL ISOVUE-370 IOPAMIDOL (ISOVUE-370) INJECTION 76% COMPARISON:  Noncontrast head CT today. No prior angiographic imaging. FINDINGS: CTA NECK FINDINGS Aortic arch: Standard 3 vessel aortic arch with mild atherosclerotic plaque. Widely patent arch vessel origins. Right carotid system: Patent without evidence of stenosis or dissection.  Left carotid system: Patent without evidence of stenosis or dissection. Vertebral arteries: Patent and codominant without evidence of dissection or hemodynamically significant stenosis. Possible mild left vertebral artery origin stenosis, with motion artifact limiting assessment. Skeleton: Right anterior pterional craniotomy. Moderate cervical disc and facet degeneration. Other neck: No mass or enlarged lymph nodes. Upper chest: Motion artifact in the lung apices with posterior right upper lobe opacity likely reflecting atelectasis. Review of the MIP images confirms the above findings CTA HEAD FINDINGS Anterior circulation: The internal carotid arteries are widely patent from skull base to carotid termini. An aneurysm clip is present in the anterior communicating region with associated streak artifact. The ACAs are patent, with the right A1 segment being hypoplastic. No recurrent/residual aneurysm is identified. The MCAs are patent without evidence of proximal branch occlusion or significant proximal stenosis. There is diffuse asymmetric attenuation  of distal branch vessels throughout the right MCA territory. Posterior circulation: The intracranial vertebral arteries are widely patent to the basilar. Dominant right PICA and left AICA. Patent SCAs. The basilar artery is widely patent. There is a patent right posterior communicating artery, and the right P1 segment appears hypoplastic. The PCAs are patent without evidence of significant stenosis. No aneurysm. Venous sinuses: Patent. Anatomic variants: Hypoplastic right A1 and right P1 segments. Delayed phase: No abnormal enhancement. Review of the MIP images confirms the above findings CT Brain Perfusion Findings: There is prominent motion artifact in the mid to latter part of the acquisition. CBF (<30%) Volume: 0 mL Perfusion (Tmax>6.0s) volume: 136 mL, however this includes portions of the posterior fossa and posterior left cerebral hemisphere which are favored to  be artifactual. The majority of the prolonged transit involves the right cerebral hemisphere peripheral to the large area of chronic encephalomalacia. Infarction Location: None based on a threshold of CBF <30% of the contralateral side, although there is focal less severely reduced CBF (<38%) in the high right frontoparietal region IMPRESSION: 1. No emergent large vessel occlusion. 2. 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 Diffuse attenuation of distal right MCA branch vessels with prolonged transit throughout the right hemisphere peripheral to the encephalomalacia, likely chronic. No definite acute core infarct based on motion degraded perfusion imaging. 3. No significant proximal intracranial arterial stenosis. 4. Widely patent cervical carotid arteries. 5. 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 These results were called by telephone at the time of interpretation on 04/21/2017 at 12:36 pm to Dr. Thurnell Garbe, who verbally acknowledged these results. Electronically Signed   By: Logan Bores M.D.   On: 04/21/2017 13:09   US Renal  Result Date: 04/22/2017 CLINICAL DATA:  Acute renal injury. EXAM: RENAL / URINARY TRACT ULTRASOUND COMPLETE COMPARISON:  Ultrasound 04/21/2017. FINDINGS: Right Kidney: Length: 10.2 cm. Echogenicity within normal limits. No mass. Mild hydronephrosis. 11 mm renal pelvic stone. Left Kidney: Length: 10.6 cm. Echogenicity within normal limits. No mass or hydronephrosis visualized. Limited evaluation due to overlying bowel gas, patient's body habitus, and positioning difficulty. Bladder: Bladder is nondistended. By history the patient has a Foley catheter. IMPRESSION: 1. 11 mm right renal pelvic stone. Mild hydronephrosis. Stable exam. No change from prior study of 04/21/2017. 2. Limited evaluation of the left kidney due to overlying bowel gas, patient's body habitus, and positioning difficulty.  Electronically Signed   By: Marcello Moores  Register   On: 04/22/2017 10:24   US Renal  Result Date: 04/21/2017 CLINICAL DATA:  Acute kidney injury. EXAM: RENAL / URINARY TRACT ULTRASOUND COMPLETE COMPARISON:  CT scan of the abdomen dated 01/06/2013 FINDINGS: Right Kidney: Length: 10.5 cm. 11 mm stone right renal pelvis. Minimal prominence of the calices. Renal pelvis is not distended. Echogenicity within normal limits. No mass visualized. Left Kidney: Length: 11.7 cm. Small stones in the lower pole. No hydronephrosis. Echogenicity within normal limits. No mass or hydronephrosis visualized. Bladder: Appears normal for degree of bladder distention. IMPRESSION: 1. Chronic bilateral renal calculi. 2. Minimal dilatation of the calices of the right kidney without dilatation of the renal pelvis or ureter. Electronically Signed   By: Lorriane Shire M.D.   On: 04/21/2017 17:26   Ct Cerebral Perfusion W Contrast  Result Date: 04/21/2017 CLINICAL DATA:  Altered mental status.  Abnormal speech. EXAM: CT ANGIOGRAPHY HEAD AND NECK CT PERFUSION BRAIN TECHNIQUE: Multidetector CT imaging of the head and neck was  performed using the standard protocol during bolus administration of intravenous contrast. Multiplanar CT image reconstructions and MIPs were obtained to evaluate the vascular anatomy. Carotid stenosis measurements (when applicable) are obtained utilizing NASCET criteria, using the distal internal carotid diameter as the denominator. Multiphase CT imaging of the brain was performed following IV bolus contrast injection. Subsequent parametric perfusion maps were calculated using RAPID software. CONTRAST:  69mL ISOVUE-370 IOPAMIDOL (ISOVUE-370) INJECTION 76% COMPARISON:  Noncontrast head CT today. No prior angiographic imaging. FINDINGS: CTA NECK FINDINGS Aortic arch: Standard 3 vessel aortic arch with mild atherosclerotic plaque. Widely patent arch vessel origins. Right carotid system: Patent without evidence of stenosis or  dissection. Left carotid system: Patent without evidence of stenosis or dissection. Vertebral arteries: Patent and codominant without evidence of dissection or hemodynamically significant stenosis. Possible mild left vertebral artery origin stenosis, with motion artifact limiting assessment. Skeleton: Right anterior pterional craniotomy. Moderate cervical disc and facet degeneration. Other neck: No mass or enlarged lymph nodes. Upper chest: Motion artifact in the lung apices with posterior right upper lobe opacity likely reflecting atelectasis. Review of the MIP images confirms the above findings CTA HEAD FINDINGS Anterior circulation: The internal carotid arteries are widely patent from skull base to carotid termini. An aneurysm clip is present in the anterior communicating region with associated streak artifact. The ACAs are patent, with the right A1 segment being hypoplastic. No recurrent/residual aneurysm is identified. The MCAs are patent without evidence of proximal branch occlusion or significant proximal stenosis. There is diffuse asymmetric attenuation of distal branch vessels throughout the right MCA territory. Posterior circulation: The intracranial vertebral arteries are widely patent to the basilar. Dominant right PICA and left AICA. Patent SCAs. The basilar artery is widely patent. There is a patent right posterior communicating artery, and the right P1 segment appears hypoplastic. The PCAs are patent without evidence of significant stenosis. No aneurysm. Venous sinuses: Patent. Anatomic variants: Hypoplastic right A1 and right P1 segments. Delayed phase: No abnormal enhancement. Review of the MIP images confirms the above findings CT Brain Perfusion Findings: There is prominent motion artifact in the mid to latter part of the acquisition. CBF (<30%) Volume: 0 mL Perfusion (Tmax>6.0s) volume: 136 mL, however this includes portions of the posterior fossa and posterior left cerebral hemisphere which are  favored to be artifactual. The majority of the prolonged transit involves the right cerebral hemisphere peripheral to the large area of chronic encephalomalacia. Infarction Location: None based on a threshold of CBF <30% of the contralateral side, although there is focal less severely reduced CBF (<38%) in the high right frontoparietal region IMPRESSION: 1. No emergent large vessel occlusion. 2. 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 Diffuse attenuation of distal right MCA branch vessels with prolonged transit throughout the right hemisphere peripheral to the encephalomalacia, likely chronic. No definite acute core infarct based on motion degraded perfusion imaging. 3. No significant proximal intracranial arterial stenosis. 4. Widely patent cervical carotid arteries. 5. 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 These results were called by telephone at the time of interpretation on 04/21/2017 at 12:36 pm to Dr. Thurnell Garbe, who verbally acknowledged these results. Electronically Signed   By: Logan Bores M.D.   On: 04/21/2017 13:09   Dg Chest Port 1 View  Result Date: 04/21/2017 CLINICAL DATA:  Slurred speech. EXAM: PORTABLE CHEST 1 VIEW COMPARISON:  02/06/2015 FINDINGS: The heart size and pulmonary vascularity are normal and the lungs are clear. No  acute bone abnormality. IMPRESSION: No active disease. Electronically Signed   By: Lorriane Shire M.D.   On: 04/21/2017 11:42   Dg C-arm 1-60 Min-no Report  Result Date: 05/17/2017 Fluoroscopy was utilized by the requesting physician.  No radiographic interpretation.   Dg C-arm 1-60 Min-no Report  Result Date: 04/22/2017 Fluoroscopy was utilized by the requesting physician.  No radiographic interpretation.   Ct Head Code Stroke Wo Contrast  Result Date: 04/21/2017 CLINICAL DATA:  Code stroke.  Code stroke.  Abnormal speech. EXAM: CT HEAD WITHOUT CONTRAST TECHNIQUE:  Contiguous axial images were obtained from the base of the skull through the vertex without intravenous contrast. COMPARISON:  CT head without contrast 09/16/2012 FINDINGS: Brain: Remote right MCA territory encephalomalacia is stable. There is ex vacuo dilation of the right lateral ventricle. Right paraophthalmic aneurysm clip is noted. Left frontal encephalomalacia is stable. Density within the left frontal white matter is stable. 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 No acute infarct is present. Left insular ribbon is normal. No focal cortical abnormality is present. Vascular: Ventricles are of normal size. Ex vacuo dilation of the right lateral ventricle is stable. Skull: Calvarium is intact. No focal lytic or blastic lesions are present. Sinuses/Orbits: The paranasal sinuses and mastoid air cells are clear. Globes and orbits are unremarkable. ASPECTS [facility] Stroke Program Early CT Score) - Ganglionic level infarction (caudate, lentiform nuclei, internal capsule, insula, M1-M3 cortex): 7/7 - Supraganglionic infarction (M4-M6 cortex): 3/3 Total score (0-10 with 10 being normal): 10/10 IMPRESSION: 1. No acute intracranial abnormality or significant interval change. 2. 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 4. ASPECTS is 10/10 These results were called by telephone at the time of interpretation on 04/21/2017 at 10:56 am to Dr. Francine Graven , who verbally acknowledged these results. Electronically Signed   By: San Morelle M.D.   On: 04/21/2017 11:02      Assessment & Plan  1.  Complicated urinary tract infection with history of right UPJ calculus removed. 2.  SIRS with ESBL UTI at this time.  Blood cultures are pending 3.  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  Plan  Continue with IV meropenem.  I would not add any other medication at this  time.  Follow-up blood cultures. Change IV fluids to normal saline Continue holding her blood pressure medications. I do not know the mental baseline of this patient.  We will follow her during her stay. I agree with placing the Foley at this time due to recent procedure. Advise ID consult if no improvement.  DVT Prophylaxis Lovenox  AM Labs Ordered, also please review Full Orders  Time spent in minutes : 44 minutes  Condition critical   @SIGNATURE @

## 2017-05-19 DIAGNOSIS — M353 Polymyalgia rheumatica: Secondary | ICD-10-CM | POA: Diagnosis present

## 2017-05-19 DIAGNOSIS — N390 Urinary tract infection, site not specified: Secondary | ICD-10-CM | POA: Diagnosis present

## 2017-05-19 DIAGNOSIS — K219 Gastro-esophageal reflux disease without esophagitis: Secondary | ICD-10-CM | POA: Diagnosis present

## 2017-05-19 DIAGNOSIS — G20 Parkinson's disease: Secondary | ICD-10-CM | POA: Diagnosis present

## 2017-05-19 DIAGNOSIS — Z7982 Long term (current) use of aspirin: Secondary | ICD-10-CM | POA: Diagnosis not present

## 2017-05-19 DIAGNOSIS — Z6841 Body Mass Index (BMI) 40.0 and over, adult: Secondary | ICD-10-CM | POA: Diagnosis not present

## 2017-05-19 DIAGNOSIS — E1151 Type 2 diabetes mellitus with diabetic peripheral angiopathy without gangrene: Secondary | ICD-10-CM | POA: Diagnosis present

## 2017-05-19 DIAGNOSIS — E785 Hyperlipidemia, unspecified: Secondary | ICD-10-CM | POA: Diagnosis present

## 2017-05-19 DIAGNOSIS — A419 Sepsis, unspecified organism: Secondary | ICD-10-CM | POA: Diagnosis present

## 2017-05-19 DIAGNOSIS — G92 Toxic encephalopathy: Secondary | ICD-10-CM | POA: Diagnosis present

## 2017-05-19 DIAGNOSIS — M1990 Unspecified osteoarthritis, unspecified site: Secondary | ICD-10-CM | POA: Diagnosis present

## 2017-05-19 DIAGNOSIS — I69354 Hemiplegia and hemiparesis following cerebral infarction affecting left non-dominant side: Secondary | ICD-10-CM | POA: Diagnosis not present

## 2017-05-19 DIAGNOSIS — F209 Schizophrenia, unspecified: Secondary | ICD-10-CM | POA: Diagnosis present

## 2017-05-19 DIAGNOSIS — B9620 Unspecified Escherichia coli [E. coli] as the cause of diseases classified elsewhere: Secondary | ICD-10-CM | POA: Diagnosis present

## 2017-05-19 DIAGNOSIS — I10 Essential (primary) hypertension: Secondary | ICD-10-CM | POA: Diagnosis present

## 2017-05-19 DIAGNOSIS — Z792 Long term (current) use of antibiotics: Secondary | ICD-10-CM | POA: Diagnosis not present

## 2017-05-19 DIAGNOSIS — Z79899 Other long term (current) drug therapy: Secondary | ICD-10-CM | POA: Diagnosis not present

## 2017-05-19 DIAGNOSIS — N202 Calculus of kidney with calculus of ureter: Secondary | ICD-10-CM | POA: Diagnosis present

## 2017-05-19 DIAGNOSIS — A499 Bacterial infection, unspecified: Secondary | ICD-10-CM | POA: Diagnosis not present

## 2017-05-19 DIAGNOSIS — F329 Major depressive disorder, single episode, unspecified: Secondary | ICD-10-CM | POA: Diagnosis present

## 2017-05-19 DIAGNOSIS — G40909 Epilepsy, unspecified, not intractable, without status epilepticus: Secondary | ICD-10-CM | POA: Diagnosis present

## 2017-05-19 DIAGNOSIS — N12 Tubulo-interstitial nephritis, not specified as acute or chronic: Secondary | ICD-10-CM | POA: Diagnosis present

## 2017-05-19 DIAGNOSIS — Z1612 Extended spectrum beta lactamase (ESBL) resistance: Secondary | ICD-10-CM | POA: Diagnosis present

## 2017-05-19 DIAGNOSIS — E039 Hypothyroidism, unspecified: Secondary | ICD-10-CM | POA: Diagnosis present

## 2017-05-19 DIAGNOSIS — G4731 Primary central sleep apnea: Secondary | ICD-10-CM | POA: Diagnosis present

## 2017-05-19 LAB — CBC
HCT: 32.6 % — ABNORMAL LOW (ref 36.0–46.0)
Hemoglobin: 9.8 g/dL — ABNORMAL LOW (ref 12.0–15.0)
MCH: 29.8 pg (ref 26.0–34.0)
MCHC: 30.1 g/dL (ref 30.0–36.0)
MCV: 99.1 fL (ref 78.0–100.0)
PLATELETS: 121 10*3/uL — AB (ref 150–400)
RBC: 3.29 MIL/uL — ABNORMAL LOW (ref 3.87–5.11)
RDW: 16.1 % — ABNORMAL HIGH (ref 11.5–15.5)
WBC: 10.4 10*3/uL (ref 4.0–10.5)

## 2017-05-19 LAB — BASIC METABOLIC PANEL
Anion gap: 8 (ref 5–15)
BUN: 23 mg/dL — AB (ref 6–20)
CALCIUM: 7.7 mg/dL — AB (ref 8.9–10.3)
CO2: 29 mmol/L (ref 22–32)
Chloride: 106 mmol/L (ref 101–111)
Creatinine, Ser: 1.2 mg/dL — ABNORMAL HIGH (ref 0.44–1.00)
GFR calc Af Amer: 55 mL/min — ABNORMAL LOW (ref >60)
GFR, EST NON AFRICAN AMERICAN: 48 mL/min — AB (ref >60)
GLUCOSE: 103 mg/dL — AB (ref 65–99)
Potassium: 4.3 mmol/L (ref 3.5–5.1)
Sodium: 143 mmol/L (ref 135–145)

## 2017-05-19 LAB — URINE CULTURE: Culture: >=100000 — AB

## 2017-05-19 LAB — GLUCOSE, CAPILLARY
Glucose-Capillary: 99 mg/dL (ref 65–99)
Glucose-Capillary: 143 mg/dL — ABNORMAL HIGH (ref 65–99)
Glucose-Capillary: 121 mg/dL — ABNORMAL HIGH (ref 65–99)

## 2017-05-19 MED ORDER — SODIUM CHLORIDE 0.45 % IV BOLUS
500.0000 mL | Freq: Once | INTRAVENOUS | Status: AC
  Administered 2017-05-19: 500 mL via INTRAVENOUS

## 2017-05-19 NOTE — Progress Notes (Signed)
UOP down--will give IV 1/2 NS bolus

## 2017-05-19 NOTE — Progress Notes (Signed)
2 Days Post-Op Subjective: Patient talkative this am. No c/o pain.  Objective: Vital signs in last 24 hours: Temp:  [97.3 F (36.3 C)-102.8 F (39.3 C)] 97.9 F (36.6 C) (03/13 0421) Pulse Rate:  [81-106] 84 (03/13 0421) Resp:  [16-18] 16 (03/13 0421) BP: (82-138)/(41-60) 112/60 (03/13 0421) SpO2:  [97 %-98 %] 97 % (03/13 0421)  Intake/Output from previous day: 03/12 0701 - 03/13 0700 In: 1911.3 [P.O.:840; I.V.:771.3; IV Piggyback:300] Out: 401 [Urine:400; Stool:1] Intake/Output this shift: Total I/O In: 1331.3 [P.O.:360; I.V.:771.3; IV Piggyback:200] Out: 400 [Urine:400]  Physical Exam:  Constitutional: Vital signs reviewed. WD WN in NAD   Eyes: PERRL, No scleral icterus.   Pulmonary/Chest: Normal effort   Lab Results: Recent Labs    05/17/17 0630 05/18/17 1158  HGB 11.7* 10.3*  HCT 37.9 34.4*   BMET Recent Labs    05/17/17 0630 05/18/17 1158  NA 143 141  K 3.9 3.9  CL 104 104  CO2 31 27  GLUCOSE 110* 201*  BUN 18 22*  CREATININE 0.84 1.28*  CALCIUM 8.9 7.9*   No results for input(s): LABPT, INR in the last 72 hours. No results for input(s): LABURIN in the last 72 hours. Results for orders placed or performed during the hospital encounter of 05/17/17  Urine Culture     Status: Abnormal (Preliminary result)   Collection Time: 05/17/17  8:03 AM  Result Value Ref Range Status   Specimen Description   Final    URINE, RANDOM CYTOSCOPE Performed at [facility], [place] [address] [place], [place] [address]    Special Requests   Final    NONE Performed at [facility] [place] [facility], [place] [address], [place], [place] [address]    Culture (A)  Final    >=100,000 COLONIES/mL ESCHERICHIA COLI SUSCEPTIBILITIES TO FOLLOW Performed at [facility], [place] [address], [place],  [address]    Report Status PENDING  Incomplete    Studies/Results: Dg C-arm 1-60 Min-no Report  Result Date: 05/17/2017 Fluoroscopy was utilized by  the requesting physician.  No radiographic interpretation.    Assessment/Plan:   Recurrent UTI following URS--sepsis picture. On meropenem--growing E.coli. Less lethargic this am. UOP somewhat low--will recheck labs. Cont abx. Appreciate input of Dr Ebony Hail.   LOS: 0 days   Jorja Loa 05/19/2017, 6:47 AM

## 2017-05-19 NOTE — Progress Notes (Signed)
Held blood pressure medications this morning due to low blood pressure. Re-checked blood pressure, which has come up a little but still on the soft side. MD is made aware of soft blood pressure. Waiting on any furthers orders at this time.

## 2017-05-19 NOTE — Progress Notes (Signed)
PT Cancellation Note  Patient Details Name: [name] MRN: [id number] DOB: September 20, 1955   Cancelled Treatment:    Reason Eval/Treat Not Completed: PT screened, no needs identified, will sign off Pt with recent admission in Feb.  PT screened pt at that time.  "Pt has been a resident of [facility] for 9 years per her report.  She is bedbound and facility uses lift for OOB.  Pt does not appear appropriate for acute skilled PT services.  PT to sign off."   Edric Fetterman,KATHrine E 05/19/2017, 8:29 AM Carmelia Bake, PT, DPT 05/19/2017 Pager: [phone]

## 2017-05-20 ENCOUNTER — Inpatient Hospital Stay (HOSPITAL_COMMUNITY): Payer: Medicare Other

## 2017-05-20 ENCOUNTER — Inpatient Hospital Stay: Payer: Self-pay

## 2017-05-20 DIAGNOSIS — G929 Unspecified toxic encephalopathy: Secondary | ICD-10-CM

## 2017-05-20 DIAGNOSIS — Z1612 Extended spectrum beta lactamase (ESBL) resistance: Secondary | ICD-10-CM

## 2017-05-20 DIAGNOSIS — N12 Tubulo-interstitial nephritis, not specified as acute or chronic: Secondary | ICD-10-CM

## 2017-05-20 DIAGNOSIS — Z792 Long term (current) use of antibiotics: Secondary | ICD-10-CM

## 2017-05-20 DIAGNOSIS — G92 Toxic encephalopathy: Secondary | ICD-10-CM

## 2017-05-20 DIAGNOSIS — A499 Bacterial infection, unspecified: Secondary | ICD-10-CM

## 2017-05-20 LAB — BASIC METABOLIC PANEL
Anion gap: 9 (ref 5–15)
BUN: 18 mg/dL (ref 6–20)
CHLORIDE: 105 mmol/L (ref 101–111)
CO2: 28 mmol/L (ref 22–32)
Calcium: 7.9 mg/dL — ABNORMAL LOW (ref 8.9–10.3)
Creatinine, Ser: 0.83 mg/dL (ref 0.44–1.00)
Glucose, Bld: 100 mg/dL — ABNORMAL HIGH (ref 65–99)
POTASSIUM: 4.2 mmol/L (ref 3.5–5.1)
SODIUM: 142 mmol/L (ref 135–145)

## 2017-05-20 LAB — GLUCOSE, CAPILLARY
GLUCOSE-CAPILLARY: 90 mg/dL (ref 65–99)
GLUCOSE-CAPILLARY: 88 mg/dL (ref 65–99)
GLUCOSE-CAPILLARY: 113 mg/dL — AB (ref 65–99)
GLUCOSE-CAPILLARY: 100 mg/dL — AB (ref 65–99)
Glucose-Capillary: 102 mg/dL — ABNORMAL HIGH (ref 65–99)

## 2017-05-20 MED ORDER — LIDOCAINE HCL 1 % IJ SOLN
INTRAMUSCULAR | Status: AC
  Administered 2017-05-20: 15:00:00
  Filled 2017-05-20: qty 20

## 2017-05-20 MED ORDER — SODIUM CHLORIDE 0.9% FLUSH
10.0000 mL | INTRAVENOUS | Status: DC | PRN
  Administered 2017-05-21: 10 mL
  Filled 2017-05-20: qty 40

## 2017-05-20 NOTE — Progress Notes (Signed)
2 attempts to place a PICC line. Pt is not able to tolerate the needle stick. Screams in pain even after 26ml of lidocaine she also pulls away her arm when needle is placed in the skin. I spoke to [employer] and suggested she contact the MD and request IR placement. [employer]

## 2017-05-20 NOTE — Progress Notes (Signed)
3 Days Post-Op Subjective: Patient reports that she is not in pain currently.  Objective: Vital signs in last 24 hours: Temp:  [98.6 F (37 C)-98.9 F (37.2 C)] 98.9 F (37.2 C) (03/14 0513) Pulse Rate:  [87-100] 98 (03/14 0513) Resp:  [16-18] 18 (03/14 0513) BP: (89-106)/(46-59) 93/53 (03/14 0513) SpO2:  [96 %-97 %] 96 % (03/14 0513)  Intake/Output from previous day: 03/13 0701 - 03/14 0700 In: 2840 [P.O.:1140; I.V.:1500; IV Piggyback:200] Out: 1150 [Urine:1150] Intake/Output this shift: Total I/O In: 1300 [P.O.:300; I.V.:900; IV Piggyback:100] Out: 1025 [Urine:1025]  Physical Exam:  Constitutional: Vital signs reviewed. WD WN in NAD   Eyes: PERRL, No scleral icterus.   Cardiovascular: RRR Pulmonary/Chest: Normal effort   Lab Results: Recent Labs    05/17/17 0630 05/18/17 1158 05/19/17 0746  HGB 11.7* 10.3* 9.8*  HCT 37.9 34.4* 32.6*   BMET Recent Labs    05/18/17 1158 05/19/17 0746  NA 141 143  K 3.9 4.3  CL 104 106  CO2 27 29  GLUCOSE 201* 103*  BUN 22* 23*  CREATININE 1.28* 1.20*  CALCIUM 7.9* 7.7*   No results for input(s): LABPT, INR in the last 72 hours. No results for input(s): LABURIN in the last 72 hours. Results for orders placed or performed during the hospital encounter of 05/17/17  Urine Culture     Status: Abnormal   Collection Time: 05/17/17  8:03 AM  Result Value Ref Range Status   Specimen Description   Final    URINE, RANDOM CYTOSCOPE Performed at [facility], [place] [address], [place], [place] [address]    Special Requests   Final    NONE Performed at [facility], [place] [address] [place], [place] [address]    Culture (A)  Final    >=100,000 COLONIES/mL ESCHERICHIA COLI Confirmed Extended Spectrum Beta-Lactamase Producer (ESBL).  In bloodstream infections from ESBL organisms, carbapenems are preferred over piperacillin/tazobactam. They are shown to have a lower risk of mortality. Performed  at [facility], [place] [address] [place], [place] [address]    Report Status 05/19/2017 FINAL  Final   Organism ID, Bacteria ESCHERICHIA COLI (A)  Final      Susceptibility   Escherichia coli - MIC*    AMPICILLIN >=32 RESISTANT Resistant     CEFAZOLIN >=64 RESISTANT Resistant     CEFTRIAXONE >=64 RESISTANT Resistant     CIPROFLOXACIN >=4 RESISTANT Resistant     GENTAMICIN <=1 SENSITIVE Sensitive     IMIPENEM <=0.25 SENSITIVE Sensitive     NITROFURANTOIN <=16 SENSITIVE Sensitive     TRIMETH/SULFA >=320 RESISTANT Resistant     AMPICILLIN/SULBACTAM >=32 RESISTANT Resistant     PIP/TAZO 16 SENSITIVE Sensitive     Extended ESBL POSITIVE Resistant     * >=100,000 COLONIES/mL ESCHERICHIA COLI  Culture, blood (routine x 2)     Status: None (Preliminary result)   Collection Time: 05/18/17 11:58 AM  Result Value Ref Range Status   Specimen Description   Final    BLOOD RIGHT HAND Performed at [place] [address], [place], [place] [address]    Special Requests   Final    IN PEDIATRIC BOTTLE Blood Culture adequate volume Performed at [place] [address] [place], [place] [address]    Culture   Final    NO GROWTH 1 DAY Performed at [facility], [place] [address] [place], [place] [address]    Report Status PENDING  Incomplete  Culture, blood (routine x 2)     Status: None (Preliminary result)   Collection Time: 05/18/17 11:58 AM  Result Value Ref Range Status   Specimen Description   Final    BLOOD LEFT HAND Performed at [place] [address] [place], [place] [address]    Special Requests   Final    IN PEDIATRIC BOTTLE Blood Culture adequate volume Performed at [place] [address] [place], [place] [address]    Culture   Final    NO GROWTH 1 DAY Performed at [facility], [place] [address], [place], [place] [address]    Report Status PENDING  Incomplete     Studies/Results: No results found.  Assessment/Plan:   UTI/sepsis.  Bilaterally stented.  Resistant E. coli, on meropenem.  She seems to be improving with current therapy.    She will need extended course of IV antibiotics.  Because of this, I will have PICC line placed.    Anticipate that she will be able to go home in the day or 2 on IV antibiotics at [facility]   LOS: 1 day   Jorja Loa 05/20/2017, 5:50 AM

## 2017-05-20 NOTE — Progress Notes (Signed)
Pt BP still low this AM. Dr. Diona Fanti assessing patient and made aware. Order to saline lock IV.

## 2017-05-20 NOTE — Progress Notes (Signed)
Patient BP slightly low. Pt is asymptomatic. Urology on call B. Fleck paged and made aware. Verbal order to hold labetalol and losarton doses for tonight. Will continue to monitor patient.

## 2017-05-20 NOTE — Progress Notes (Signed)
[facility] PROGRESS NOTE    Progress Note  [name]  IOX:[id number] DOB: [birth year] DOA: 05/17/2017 PCP: Hilbert Corrigan, MD     Brief Narrative:   [name] is an 62 y.o. female past medical history of CVA with right-sided paralysis nephrolithiasis with a history of a right UPJ calculus removed came to hospital on 05/17/2012, underwent surgical intervention urine culture grew E. Coli ESBL.  Assessment/Plan:   Active Problems:   History of stroke   Renal calculus   Toxic encephalopathy   Pyelonephritis   ESBL (extended spectrum beta-lactamase) producing bacteria infection  Her encephalopathy as per daughter relates that has returned to baseline. She does have a history of CVA with right-sided paralysis which puts her at risk of vascular dementia. I agree with empiric antibiotics IV meropenem as her urine culture grew ESBL E. coli. We will deferred antibiotic regimen to urology. At this point in time we will sign off can use Haldol for agitation if needed.  DVT prophylaxis: lovenox Family Communication:Daughter Disposition Plan/Barrier to D/C: per URology Code Status:     Code Status Orders  (From admission, onward)        Start     Ordered   05/17/17 1240  Full code  Continuous     05/17/17 1239    Code Status History    Date Active Date Inactive Code Status Order ID Comments User Context   04/21/2017 16:46 04/28/2017 19:22 Full Code [id number]  Rodena Goldmann, DO ED   11/18/2012 18:57 11/23/2012 23:20 Full Code [id number]  Kinnie Feil, MD Inpatient   09/16/2012 08:21 09/23/2012 21:26 Full Code [id number]  Juanito Doom, MD Inpatient   08/01/2012 05:23 08/09/2012 19:06 Full Code [id number]  Orvan Falconer, MD Inpatient   07/27/2012 20:41 07/31/2012 17:11 Full Code [id number]  Bonnielee Haff, MD Inpatient        IV Access:    Peripheral IV   Procedures and diagnostic studies:   [place] Ekg Site Rite  Result Date: 05/20/2017 If Site Rite image  not attached, placement could not be confirmed due to current cardiac rhythm.    Medical Consultants:    None.  Anti-Infectives:   Meropenem  Subjective:    Arn Medal she has no new complaints except that we are not letting her sleep.  Objective:    Vitals:   05/20/17 0236 05/20/17 0513 05/20/17 0754 05/20/17 1044  BP: (!) 97/47 (!) 93/53 119/66 112/67  Pulse: 94 98 91 88  Resp: 16 18    Temp:  98.9 F (37.2 C)    TempSrc: Oral Oral    SpO2: 96% 96%    Weight:      Height:        Intake/Output Summary (Last 24 hours) at 05/20/2017 1208 Last data filed at 05/20/2017 0900 Gross per 24 hour  Intake 2820 ml  Output 1025 ml  Net 1795 ml   Filed Weights   05/17/17 0617  Weight: 122.9 kg (271 lb)    Exam: General exam: In no acute distress, obese. Respiratory system: Good air movement and clear to auscultation. Cardiovascular system: S1 & S2 heard, RRR.  Gastrointestinal system: Abdomen is nondistended, soft and nontender.  Extremities: No pedal edema. Skin: No rashes, lesions or ulcers    Data Reviewed:    Labs: Basic Metabolic Panel: Recent Labs  Lab 05/17/17 0630 05/18/17 1158 05/19/17 0746 05/20/17 0521  NA 143 141 143 142  K 3.9 3.9 4.3 4.2  CL  104 104 106 105  CO2 31 27 29 28   GLUCOSE 110* 201* 103* 100*  BUN 18 22* 23* 18  CREATININE 0.84 1.28* 1.20* 0.83  CALCIUM 8.9 7.9* 7.7* 7.9*   GFR Estimated Creatinine Clearance: 87.4 mL/min (by C-G formula based on SCr of 0.83 mg/dL). Liver Function Tests: Recent Labs  Lab 05/18/17 1158  AST 16  ALT <5*  ALKPHOS 50  BILITOT 0.3  PROT 6.2*  ALBUMIN 2.6*   No results for input(s): LIPASE, AMYLASE in the last 168 hours. No results for input(s): AMMONIA in the last 168 hours. Coagulation profile No results for input(s): INR, PROTIME in the last 168 hours.  CBC: Recent Labs  Lab 05/17/17 0630 05/18/17 1158 05/19/17 0746  WBC 5.6 10.1 10.4  NEUTROABS  --  6.2  --   HGB 11.7*  10.3* 9.8*  HCT 37.9 34.4* 32.6*  MCV 96.7 97.7 99.1  PLT 149* 109* 121*   Cardiac Enzymes: No results for input(s): CKTOTAL, CKMB, CKMBINDEX, TROPONINI in the last 168 hours. BNP (last 3 results) No results for input(s): PROBNP in the last 8760 hours. CBG: Recent Labs  Lab 05/19/17 1148 05/19/17 1619 05/19/17 2259 05/20/17 0811 05/20/17 1159  GLUCAP 143* 121* 90 88 113*   D-Dimer: No results for input(s): DDIMER in the last 72 hours. Hgb A1c: No results for input(s): HGBA1C in the last 72 hours. Lipid Profile: No results for input(s): CHOL, HDL, LDLCALC, TRIG, CHOLHDL, LDLDIRECT in the last 72 hours. Thyroid function studies: No results for input(s): TSH, T4TOTAL, T3FREE, THYROIDAB in the last 72 hours.  Invalid input(s): FREET3 Anemia work up: No results for input(s): VITAMINB12, FOLATE, FERRITIN, TIBC, IRON, RETICCTPCT in the last 72 hours. Sepsis Labs: Recent Labs  Lab 05/17/17 0630 05/18/17 1158 05/18/17 1459 05/19/17 0746  WBC 5.6 10.1  --  10.4  LATICACIDVEN  --  2.2* 2.3*  --    Microbiology Recent Results (from the past 240 hour(s))  Urine Culture     Status: Abnormal   Collection Time: 05/17/17  8:03 AM  Result Value Ref Range Status   Specimen Description   Final    URINE, RANDOM CYTOSCOPE Performed at [facility], [place] [address], [place] [employer] [facility], [place] [address]    Special Requests   Final    NONE Performed at [facility], [place] [address] [place], [place] [address]    Culture (A)  Final    >=100,000 COLONIES/mL ESCHERICHIA COLI Confirmed Extended Spectrum Beta-Lactamase Producer (ESBL).  In bloodstream infections from ESBL organisms, carbapenems are preferred over piperacillin/tazobactam. They are shown to have a lower risk of mortality. Performed at [facility], [place] [address] [place], [place] [address]    Report Status 05/19/2017 FINAL  Final   Organism ID, Bacteria ESCHERICHIA COLI (A)  Final       Susceptibility   Escherichia coli - MIC*    AMPICILLIN >=32 RESISTANT Resistant     CEFAZOLIN >=64 RESISTANT Resistant     CEFTRIAXONE >=64 RESISTANT Resistant     CIPROFLOXACIN >=4 RESISTANT Resistant     GENTAMICIN <=1 SENSITIVE Sensitive     IMIPENEM <=0.25 SENSITIVE Sensitive     NITROFURANTOIN <=16 SENSITIVE Sensitive     TRIMETH/SULFA >=320 RESISTANT Resistant     AMPICILLIN/SULBACTAM >=32 RESISTANT Resistant     PIP/TAZO 16 SENSITIVE Sensitive     Extended ESBL POSITIVE Resistant     * >=100,000 COLONIES/mL ESCHERICHIA COLI  Culture, blood (routine x 2)  Status: None (Preliminary result)   Collection Time: 05/18/17 11:58 AM  Result Value Ref Range Status   Specimen Description   Final    BLOOD RIGHT HAND Performed at [facility] [address] [place], [place] [address]    Special Requests   Final    IN PEDIATRIC BOTTLE Blood Culture adequate volume Performed at [place] [address] [place], [place] [address]    Culture   Final    NO GROWTH 1 DAY Performed at [facility], [place] [address], [place], [place] [address]    Report Status PENDING  Incomplete  Culture, blood (routine x 2)     Status: None (Preliminary result)   Collection Time: 05/18/17 11:58 AM  Result Value Ref Range Status   Specimen Description   Final    BLOOD LEFT HAND Performed at [facility] [address], [place], [place] [address]    Special Requests   Final    IN PEDIATRIC BOTTLE Blood Culture adequate volume Performed at [place] [address] [place], [place] [address]    Culture   Final    NO GROWTH 1 DAY Performed at [facility], [place] [address] [place], [place] [address]    Report Status PENDING  Incomplete     Medications:   . amLODipine  10 mg Oral Daily  . aspirin EC  81 mg Oral Daily  . divalproex  125 mg Oral BID  . divalproex  500 mg Oral BID  . DULoxetine  60 mg  Oral Daily  . enoxaparin (LOVENOX) injection  40 mg Subcutaneous Q24H  . furosemide  20 mg Oral BID  . labetalol  200 mg Oral BID  . levETIRAcetam  1,000 mg Oral Daily   And  . levETIRAcetam  1,500 mg Oral QHS  . levothyroxine  100 mcg Oral QAC breakfast  . lidocaine  1 patch Transdermal Q24H  . losartan  50 mg Oral QHS  . olopatadine  1 drop Both Eyes QHS  . pantoprazole  40 mg Oral Daily  . polyethylene glycol  17 g Oral Daily  . rosuvastatin  5 mg Oral QHS  . traZODone  50 mg Oral QHS   Continuous Infusions: . sodium chloride Stopped (05/20/17 0626)  . meropenem (MERREM) IV Stopped (05/20/17 0037)      LOS: 1 day   Charlynne Cousins  [facility] Pager [phone]  *Please refer to [email or web address], password [email or web address] to get updated schedule on who will round on this patient, as hospitalists switch teams weekly. If 7PM-7AM, please contact night-coverage at [email or web address], password [email or web address] for any overnight needs.  05/20/2017, 12:08 PM

## 2017-05-20 NOTE — Progress Notes (Signed)
Peripherally Inserted Central Catheter/Midline Placement  The IV Nurse has discussed with the patient and/or persons authorized to consent for the patient, the purpose of this procedure and the potential benefits and risks involved with this procedure.  The benefits include less needle sticks, lab draws from the catheter, and the patient may be discharged home with the catheter. Risks include, but not limited to, infection, bleeding, blood clot (thrombus formation), and puncture of an artery; nerve damage and irregular heartbeat and possibility to perform a PICC exchange if needed/ordered by physician.  Alternatives to this procedure were also discussed.  Bard Power PICC patient education guide, fact sheet on infection prevention and patient information card has been provided to patient /or left at bedside.    PICC/Midline Placement Documentation     Telephone consent given by [name] (spouse)   [name] 05/20/2017, 11:47 AM

## 2017-05-20 NOTE — Procedures (Signed)
Pre procedural Diagnosis: Poor venous access Post Procedural Diagnosis: Same  Successful placement of right brachial vein approach 42 cm dual lumen PICC line with tip at the superior caval-atrial junction.    EBL: None  No immediate post procedural complication.  The PICC line is ready for immediate use.  Ronny Bacon, MD Pager #: [phone]

## 2017-05-21 LAB — GLUCOSE, CAPILLARY
Glucose-Capillary: 87 mg/dL (ref 65–99)
Glucose-Capillary: 110 mg/dL — ABNORMAL HIGH (ref 65–99)

## 2017-05-21 MED ORDER — SODIUM CHLORIDE 0.9 % IV SOLN: 1.0000 g | Freq: Three times a day (TID) | INTRAVENOUS | 0 refills | Status: DC

## 2017-05-21 NOTE — Discharge Summary (Signed)
Patient ID: THEONA MUHS MRN: [id number] DOB/AGE: 09-03-1955 62 y.o.  Admit date: 05/17/2017 Discharge date: 05/21/2017  Primary Care Physician:  Hilbert Corrigan, MD  Discharge Diagnoses:  Sepsis, kidney stone   Consults:  Hospitalist   Discharge Medications: Allergies as of 05/21/2017      Reactions   Codeine Other (See Comments)   unknown   Lacosamide Other (See Comments)   Mood changes/agitation No rx noted    Morphine Other (See Comments)   No rx noted    Zolpidem       Medication List    TAKE these medications   acetaminophen 325 MG tablet Commonly known as:  TYLENOL Take 2 tablets (650 mg total) by mouth every 4 (four) hours as needed for mild pain (or temp > 37.5 C (99.5 F)).   amLODipine 10 MG tablet Commonly known as:  NORVASC Take 1 tablet (10 mg total) by mouth daily.   aspirin EC 81 MG tablet Take 81 mg by mouth daily.   cloNIDine 0.1 MG tablet Commonly known as:  CATAPRES Take 0.1 mg by mouth every 6 (six) hours as needed (for SBP greater than 160).   divalproex 125 MG DR tablet Commonly known as:  DEPAKOTE Take 125 mg by mouth 2 (two) times daily.   divalproex 500 MG DR tablet Commonly known as:  DEPAKOTE Take 500 mg by mouth 2 (two) times daily.   DULoxetine 20 MG capsule Commonly known as:  CYMBALTA Take 20 mg by mouth daily.   DULoxetine 60 MG capsule Commonly known as:  CYMBALTA Take 60 mg by mouth daily.   furosemide 20 MG tablet Commonly known as:  LASIX Take 20 mg by mouth 2 (two) times daily.   labetalol 200 MG tablet Commonly known as:  NORMODYNE Take 200 mg by mouth 2 (two) times daily.   lactulose (encephalopathy) 10 GM/15ML Soln Commonly known as:  CHRONULAC Take 20 g by mouth daily.   levETIRAcetam 500 MG tablet Commonly known as:  KEPPRA Take 1,500 mg by mouth 2 (two) times daily.   levothyroxine 100 MCG tablet Commonly known as:  SYNTHROID, LEVOTHROID Take 100 mcg by mouth daily before breakfast.    lidocaine 5 % Commonly known as:  LIDODERM Place 1 patch onto the skin daily. Remove & Discard patch within 12 hours or as directed by MD   loratadine 10 MG tablet Commonly known as:  CLARITIN Take 10 mg by mouth daily.   losartan 50 MG tablet Commonly known as:  COZAAR Take 50 mg by mouth at bedtime.   Menthol (Topical Analgesic) 5 % Gel Apply 1 application topically 4 (four) times daily as needed (for pain).   meropenem 1 g in sodium chloride 0.9 % 100 mL Inject 1 g into the vein every 8 (eight) hours.   multivitamin with minerals Tabs tablet Take 1 tablet by mouth daily.   omeprazole 20 MG capsule Commonly known as:  PRILOSEC Take 20 mg by mouth daily.   oxyCODONE 5 MG immediate release tablet Commonly known as:  Oxy IR/ROXICODONE Take 1 tablet (5 mg total) by mouth every 8 (eight) hours as needed.   PATADAY 0.2 % Soln Generic drug:  Olopatadine HCl Place 1 drop into both eyes at bedtime.   polyethylene glycol packet Commonly known as:  MIRALAX / GLYCOLAX Take 17 g by mouth daily.   rosuvastatin 5 MG tablet Commonly known as:  CRESTOR Take 5 mg by mouth at bedtime.   SYSTANE 0.4-0.3 % Soln  Generic drug:  Polyethyl Glycol-Propyl Glycol Place 1 drop into both eyes 3 (three) times daily as needed.   traZODone 50 MG tablet Commonly known as:  DESYREL Take 50 mg by mouth at bedtime.   Vitamin D (Ergocalciferol) 50000 units Caps capsule Commonly known as:  DRISDOL Take 50,000 Units by mouth every 30 (thirty) days.        Significant Diagnostic Studies:  Dg C-arm 1-60 Min-no Report  Result Date: 05/17/2017 Fluoroscopy was utilized by the requesting physician.  No radiographic interpretation.    Brief H and P: For complete details please refer to admission H and P, but in brief the pt was admitted for mgmt of a rt renal stone previously presenting with sepsis/obstruction. She was admitted for URS and HLL of stone.  Hospital Course: She developed sepsis  following URS/stone extraction. She responded to abx mgmt and was discharged on POD 4   Day of Discharge BP 116/71 (BP Location: Left Wrist)   Pulse 79   Temp 98.5 F (36.9 C) (Oral)   Resp 16   Ht 5\' 1"  (1.549 m)   Wt 122.9 kg (271 lb)   SpO2 99%   BMI 51.21 kg/m   Results for orders placed or performed during the hospital encounter of 05/17/17 (from the past 24 hour(s))  Glucose, capillary     Status: None   Collection Time: 05/20/17  8:11 AM  Result Value Ref Range   Glucose-Capillary 88 65 - 99 mg/dL  Glucose, capillary     Status: Abnormal   Collection Time: 05/20/17 11:59 AM  Result Value Ref Range   Glucose-Capillary 113 (H) 65 - 99 mg/dL  Glucose, capillary     Status: Abnormal   Collection Time: 05/20/17  4:58 PM  Result Value Ref Range   Glucose-Capillary 100 (H) 65 - 99 mg/dL  Glucose, capillary     Status: Abnormal   Collection Time: 05/20/17  9:28 PM  Result Value Ref Range   Glucose-Capillary 102 (H) 65 - 99 mg/dL  Glucose, capillary     Status: None   Collection Time: 05/21/17  7:31 AM  Result Value Ref Range   Glucose-Capillary 87 65 - 99 mg/dL    Physical Exam: General: Alert and awake oriented x3 not in any acute distress. HEENT: anicteric sclera, pupils reactive to light and accommodation CVS: S1-S2 clear no murmur rubs or gallops Chest: clear to auscultation bilaterally, no wheezing rales or rhonchi Abdomen: soft nontender, nondistended, normal bowel sounds, no organomegaly Extremities: no cyanosis, clubbing or edema noted bilaterally Neuro: Cranial nerves II-XII intact, no focal neurological deficits  Disposition:  SNF  Diet:  Regular  Activity:  bedrest   Disposition and Follow-up:    Will arrange  TESTS THAT NEED FOLLOW-UP  N/A  DISCHARGE FOLLOW-UP   Time spent on Discharge:  15 mins  Signed: Jorja Loa 05/21/2017, 7:59 AM

## 2017-05-21 NOTE — Care Management Note (Signed)
Case Management Note  Patient Details  Name: [name] MRN: [id number] Date of Birth: 1956-01-28  Subjective/Objective:                    Action/Plan:dc SNF   Expected Discharge Date:  05/21/17               Expected Discharge Plan:  Skilled Nursing Facility  In-House Referral:  Clinical Social Work  Discharge planning Services  CM Consult  Post Acute Care Choice:    Choice offered to:     DME Arranged:    DME Agency:     HH Arranged:    [facility] Agency:     Status of Service:  Completed, signed off  If discussed at [employer], dates discussed:    Additional Comments:  [name], RN 05/21/2017, 9:17 AM

## 2017-05-23 LAB — CULTURE, BLOOD (ROUTINE X 2)
CULTURE: NO GROWTH
Culture: NO GROWTH
SPECIAL REQUESTS: ADEQUATE
Special Requests: ADEQUATE

## 2017-06-08 ENCOUNTER — Other Ambulatory Visit (HOSPITAL_COMMUNITY)
Admission: RE | Admit: 2017-06-08 | Discharge: 2017-06-08 | Disposition: A | Discharge status: Home / Self Care | Payer: Medicare Other | Source: Ambulatory Visit | Attending: Urology | Admitting: Urology

## 2017-06-08 ENCOUNTER — Ambulatory Visit (INDEPENDENT_AMBULATORY_CARE_PROVIDER_SITE_OTHER): Payer: Medicare Other | Admitting: Urology

## 2017-06-08 DIAGNOSIS — N3000 Acute cystitis without hematuria: Secondary | ICD-10-CM | POA: Insufficient documentation

## 2017-06-08 DIAGNOSIS — N200 Calculus of kidney: Secondary | ICD-10-CM | POA: Diagnosis not present

## 2017-06-12 LAB — URINE CULTURE

## 2017-06-29 ENCOUNTER — Other Ambulatory Visit: Payer: Self-pay | Admitting: Urology

## 2017-07-01 ENCOUNTER — Encounter (HOSPITAL_COMMUNITY): Payer: Self-pay | Admitting: *Deleted

## 2017-07-01 ENCOUNTER — Other Ambulatory Visit: Payer: Self-pay

## 2017-07-01 NOTE — Progress Notes (Addendum)
Preop instructions for:   [name]                      Date of Birth   1955-11-17                          Date of Procedure:  07-12-17      Doctor: DR  Diona Fanti [facility] to arrive at [facility]: 530 AM Report to: Admitting  Procedure: CYSTOSCOPY, BILATERAL STENT REMOVAL  BRING BIPAP MASK AND TUBING Do not eat or drink past midnight the night before your procedure.(To include any tube feedings-must be discontinued) PATIENT TO SPEND NIGHT AFTER PROCEDURE   Take these morning medications only with sips of water.(or give through gastrostomy or feeding tube). CYMBALTA, DEPAKOTE, KEPPRA, LEVOTHYROXINE, NORVASC, (AMLODIPINE), OMEPRAZOLE, LABETOLOL, OXYCODONE IF NEEDED, CLONIDINE IF NEEDED, LIDODERM PATCH  Note: No Insulin or Diabetic meds should be given or taken the morning of the procedure!   Facility contact: [place]                 Phone:   [phone]               Health Care POA: [name] PHONE [phone] TO DO TELEPHONE CONSENT Transportation contact phone#: FACILITY TO PROVIDE TRANSPORTATION  Please send day of procedure:current med list and meds last taken that day, confirm nothing by mouth status from what time, Patient Demographic info( to include DNR status, problem list, allergies)    Bring Insurance card and picture ID Leave all jewelry and other valuables at place where living( no metal or rings to be worn) No contact lens Women-no make-up, no lotions,perfumes,powders Men-no colognes,lotions    Sent from :[facility]                   Phone:[phone]                   Fax:[phone]  Sent by Zelphia Cairo RN

## 2017-07-01 NOTE — Progress Notes (Addendum)
FAXED PRE OP INSTRUCTIONS TO [facility] FAX [phone], FAX CONFIRMATION RECEIVED , CALLED [facility] ANSWER BY PHONE

## 2017-07-05 ENCOUNTER — Encounter (HOSPITAL_COMMUNITY): Payer: Self-pay | Admitting: *Deleted

## 2017-07-05 NOTE — Progress Notes (Signed)
SPOKE WITH ASHLEY MYRICK LPN AT [facility] REQUESTED [place] TO BE FAXED TO [phone] AND ALL FAXED INSTRUCTIONS RECEIVED FROM [facility] FAXED TODAY TO [phone] RECEIVED AND UNDERSTOOD.

## 2017-07-08 NOTE — Progress Notes (Signed)
Telephone consent signed with poa larry Paster spouse by Rita Swanton rn and Hull rn for 07-12-17 surgery .

## 2017-07-12 ENCOUNTER — Observation Stay (HOSPITAL_COMMUNITY)
Admission: RE | Admit: 2017-07-12 | Discharge: 2017-07-13 | Disposition: A | Discharge status: Skilled Nursing Facility | Payer: Medicare Other | Source: Ambulatory Visit | Attending: Urology | Admitting: Urology

## 2017-07-12 ENCOUNTER — Ambulatory Visit (HOSPITAL_COMMUNITY): Payer: Medicare Other

## 2017-07-12 ENCOUNTER — Ambulatory Visit (HOSPITAL_COMMUNITY): Payer: Medicare Other | Admitting: Anesthesiology

## 2017-07-12 ENCOUNTER — Encounter (HOSPITAL_COMMUNITY): Payer: Self-pay

## 2017-07-12 ENCOUNTER — Encounter (HOSPITAL_COMMUNITY)
Admission: RE | Disposition: A | Discharge status: Skilled Nursing Facility | Payer: Self-pay | Source: Ambulatory Visit | Attending: Urology

## 2017-07-12 ENCOUNTER — Observation Stay (HOSPITAL_COMMUNITY): Payer: Medicare Other

## 2017-07-12 ENCOUNTER — Other Ambulatory Visit: Payer: Self-pay

## 2017-07-12 DIAGNOSIS — Z79899 Other long term (current) drug therapy: Secondary | ICD-10-CM | POA: Insufficient documentation

## 2017-07-12 DIAGNOSIS — F329 Major depressive disorder, single episode, unspecified: Secondary | ICD-10-CM | POA: Diagnosis not present

## 2017-07-12 DIAGNOSIS — Z87442 Personal history of urinary calculi: Secondary | ICD-10-CM | POA: Diagnosis not present

## 2017-07-12 DIAGNOSIS — G40909 Epilepsy, unspecified, not intractable, without status epilepticus: Secondary | ICD-10-CM | POA: Insufficient documentation

## 2017-07-12 DIAGNOSIS — E785 Hyperlipidemia, unspecified: Secondary | ICD-10-CM | POA: Insufficient documentation

## 2017-07-12 DIAGNOSIS — Z79891 Long term (current) use of opiate analgesic: Secondary | ICD-10-CM | POA: Diagnosis not present

## 2017-07-12 DIAGNOSIS — Z79890 Hormone replacement therapy (postmenopausal): Secondary | ICD-10-CM | POA: Diagnosis not present

## 2017-07-12 DIAGNOSIS — G20 Parkinson's disease: Secondary | ICD-10-CM | POA: Diagnosis not present

## 2017-07-12 DIAGNOSIS — I69359 Hemiplegia and hemiparesis following cerebral infarction affecting unspecified side: Secondary | ICD-10-CM | POA: Insufficient documentation

## 2017-07-12 DIAGNOSIS — B9620 Unspecified Escherichia coli [E. coli] as the cause of diseases classified elsewhere: Secondary | ICD-10-CM | POA: Diagnosis not present

## 2017-07-12 DIAGNOSIS — F5101 Primary insomnia: Secondary | ICD-10-CM | POA: Insufficient documentation

## 2017-07-12 DIAGNOSIS — E039 Hypothyroidism, unspecified: Secondary | ICD-10-CM | POA: Diagnosis not present

## 2017-07-12 DIAGNOSIS — E119 Type 2 diabetes mellitus without complications: Secondary | ICD-10-CM | POA: Diagnosis not present

## 2017-07-12 DIAGNOSIS — M353 Polymyalgia rheumatica: Secondary | ICD-10-CM | POA: Insufficient documentation

## 2017-07-12 DIAGNOSIS — G4730 Sleep apnea, unspecified: Secondary | ICD-10-CM | POA: Diagnosis not present

## 2017-07-12 DIAGNOSIS — Z7982 Long term (current) use of aspirin: Secondary | ICD-10-CM | POA: Insufficient documentation

## 2017-07-12 DIAGNOSIS — G894 Chronic pain syndrome: Secondary | ICD-10-CM | POA: Diagnosis not present

## 2017-07-12 DIAGNOSIS — Z6841 Body Mass Index (BMI) 40.0 and over, adult: Secondary | ICD-10-CM | POA: Insufficient documentation

## 2017-07-12 DIAGNOSIS — I10 Essential (primary) hypertension: Secondary | ICD-10-CM | POA: Diagnosis not present

## 2017-07-12 DIAGNOSIS — N200 Calculus of kidney: Secondary | ICD-10-CM | POA: Diagnosis not present

## 2017-07-12 DIAGNOSIS — K219 Gastro-esophageal reflux disease without esophagitis: Secondary | ICD-10-CM | POA: Insufficient documentation

## 2017-07-12 DIAGNOSIS — N390 Urinary tract infection, site not specified: Secondary | ICD-10-CM | POA: Diagnosis not present

## 2017-07-12 DIAGNOSIS — I739 Peripheral vascular disease, unspecified: Secondary | ICD-10-CM | POA: Diagnosis not present

## 2017-07-12 HISTORY — PX: CYSTOSCOPY W/ URETERAL STENT REMOVAL: SHX1430

## 2017-07-12 HISTORY — PX: CYSTOSCOPY/URETEROSCOPY/HOLMIUM LASER/STENT PLACEMENT: SHX6546

## 2017-07-12 LAB — BASIC METABOLIC PANEL
ANION GAP: 11 (ref 5–15)
BUN: 15 mg/dL (ref 6–20)
CHLORIDE: 106 mmol/L (ref 101–111)
CO2: 28 mmol/L (ref 22–32)
CREATININE: 0.8 mg/dL (ref 0.44–1.00)
Calcium: 8.6 mg/dL — ABNORMAL LOW (ref 8.9–10.3)
GFR calc non Af Amer: >60 mL/min (ref >60)
GLUCOSE: 107 mg/dL — AB (ref 65–99)
Potassium: 3.7 mmol/L (ref 3.5–5.1)
Sodium: 145 mmol/L (ref 135–145)

## 2017-07-12 LAB — CBC
HEMATOCRIT: 34.2 % — AB (ref 36.0–46.0)
Hemoglobin: 10.3 g/dL — ABNORMAL LOW (ref 12.0–15.0)
MCH: 29.2 pg (ref 26.0–34.0)
MCHC: 30.1 g/dL (ref 30.0–36.0)
MCV: 96.9 fL (ref 78.0–100.0)
Platelets: 264 10*3/uL (ref 150–400)
RBC: 3.53 MIL/uL — ABNORMAL LOW (ref 3.87–5.11)
RDW: 15.3 % (ref 11.5–15.5)
WBC: 8.5 10*3/uL (ref 4.0–10.5)

## 2017-07-12 LAB — GLUCOSE, CAPILLARY
Glucose-Capillary: 101 mg/dL — ABNORMAL HIGH (ref 65–99)
Glucose-Capillary: 112 mg/dL — ABNORMAL HIGH (ref 65–99)
Glucose-Capillary: 127 mg/dL — ABNORMAL HIGH (ref 65–99)
Glucose-Capillary: 133 mg/dL — ABNORMAL HIGH (ref 65–99)
Glucose-Capillary: 133 mg/dL — ABNORMAL HIGH (ref 65–99)

## 2017-07-12 LAB — HEMOGLOBIN A1C
Hgb A1c MFr Bld: 5.7 % — ABNORMAL HIGH (ref 4.8–5.6)
MEAN PLASMA GLUCOSE: 116.89 mg/dL

## 2017-07-12 SURGERY — CYSTOSCOPY/URETEROSCOPY/HOLMIUM LASER/STENT PLACEMENT
Anesthesia: General | Laterality: Bilateral

## 2017-07-12 MED ORDER — FUROSEMIDE 20 MG PO TABS
20.0000 mg | ORAL_TABLET | Freq: Two times a day (BID) | ORAL | Status: DC
  Administered 2017-07-12 – 2017-07-13 (×4): 20 mg via ORAL
  Filled 2017-07-12 (×4): qty 1

## 2017-07-12 MED ORDER — DEXAMETHASONE SODIUM PHOSPHATE 10 MG/ML IJ SOLN
INTRAMUSCULAR | Status: DC | PRN
  Administered 2017-07-12: 5 mg via INTRAVENOUS

## 2017-07-12 MED ORDER — LACTATED RINGERS IV SOLN
INTRAVENOUS | Status: DC
  Administered 2017-07-12: 07:00:00 via INTRAVENOUS

## 2017-07-12 MED ORDER — ACETAMINOPHEN 325 MG PO TABS
650.0000 mg | ORAL_TABLET | ORAL | Status: DC | PRN
  Administered 2017-07-12 – 2017-07-13 (×2): 650 mg via ORAL
  Filled 2017-07-12 (×2): qty 2

## 2017-07-12 MED ORDER — DULOXETINE HCL 60 MG PO CPEP
60.0000 mg | ORAL_CAPSULE | Freq: Every day | ORAL | Status: DC
  Administered 2017-07-13: 60 mg via ORAL
  Filled 2017-07-12: qty 1

## 2017-07-12 MED ORDER — TRAZODONE HCL 50 MG PO TABS
50.0000 mg | ORAL_TABLET | Freq: Every day | ORAL | Status: DC
  Administered 2017-07-12: 50 mg via ORAL
  Filled 2017-07-12: qty 1

## 2017-07-12 MED ORDER — AMLODIPINE BESYLATE 10 MG PO TABS
10.0000 mg | ORAL_TABLET | Freq: Every day | ORAL | Status: DC
  Administered 2017-07-13: 10 mg via ORAL
  Filled 2017-07-12: qty 1

## 2017-07-12 MED ORDER — FENTANYL CITRATE (PF) 100 MCG/2ML IJ SOLN
INTRAMUSCULAR | Status: AC
  Filled 2017-07-12: qty 2

## 2017-07-12 MED ORDER — OXYCODONE HCL 5 MG PO TABS
5.0000 mg | ORAL_TABLET | Freq: Three times a day (TID) | ORAL | Status: DC | PRN
  Administered 2017-07-12 – 2017-07-13 (×2): 5 mg via ORAL
  Filled 2017-07-12 (×2): qty 1

## 2017-07-12 MED ORDER — MIDAZOLAM HCL 2 MG/2ML IJ SOLN
INTRAMUSCULAR | Status: AC
  Filled 2017-07-12: qty 2

## 2017-07-12 MED ORDER — LOSARTAN POTASSIUM 50 MG PO TABS
50.0000 mg | ORAL_TABLET | Freq: Every day | ORAL | Status: DC
  Administered 2017-07-12: 50 mg via ORAL
  Filled 2017-07-12: qty 1

## 2017-07-12 MED ORDER — SODIUM CHLORIDE 0.9% FLUSH: 10.0000 mL | INTRAVENOUS | Status: DC | PRN

## 2017-07-12 MED ORDER — POLYVINYL ALCOHOL 1.4 % OP SOLN: 1.0000 [drp] | OPHTHALMIC | Status: DC | PRN

## 2017-07-12 MED ORDER — CLONIDINE HCL 0.1 MG PO TABS: 0.1000 mg | ORAL_TABLET | Freq: Four times a day (QID) | ORAL | Status: DC | PRN

## 2017-07-12 MED ORDER — FENTANYL CITRATE (PF) 100 MCG/2ML IJ SOLN
INTRAMUSCULAR | Status: DC | PRN
  Administered 2017-07-12 (×2): 50 ug via INTRAVENOUS

## 2017-07-12 MED ORDER — LIDOCAINE 2% (20 MG/ML) 5 ML SYRINGE
INTRAMUSCULAR | Status: DC | PRN
  Administered 2017-07-12: 40 mg via INTRAVENOUS

## 2017-07-12 MED ORDER — PROPOFOL 10 MG/ML IV BOLUS
INTRAVENOUS | Status: AC
  Filled 2017-07-12: qty 40

## 2017-07-12 MED ORDER — ONDANSETRON HCL 4 MG/2ML IJ SOLN
INTRAMUSCULAR | Status: DC | PRN
  Administered 2017-07-12: 4 mg via INTRAVENOUS

## 2017-07-12 MED ORDER — LABETALOL HCL 200 MG PO TABS
200.0000 mg | ORAL_TABLET | Freq: Two times a day (BID) | ORAL | Status: DC
  Administered 2017-07-12 – 2017-07-13 (×2): 200 mg via ORAL
  Filled 2017-07-12 (×2): qty 1

## 2017-07-12 MED ORDER — ASPIRIN EC 81 MG PO TBEC
81.0000 mg | DELAYED_RELEASE_TABLET | Freq: Every day | ORAL | Status: DC
  Administered 2017-07-13: 81 mg via ORAL
  Filled 2017-07-12: qty 1

## 2017-07-12 MED ORDER — SODIUM CHLORIDE 0.45 % IV SOLN
INTRAVENOUS | Status: DC
  Administered 2017-07-12 – 2017-07-13 (×2): via INTRAVENOUS

## 2017-07-12 MED ORDER — LACTULOSE 10 GM/15ML PO SOLN
20.0000 g | Freq: Every day | ORAL | Status: DC
  Administered 2017-07-13: 20 g via ORAL
  Filled 2017-07-12: qty 30

## 2017-07-12 MED ORDER — ONDANSETRON HCL 4 MG/2ML IJ SOLN
INTRAMUSCULAR | Status: AC
  Filled 2017-07-12: qty 2

## 2017-07-12 MED ORDER — PANTOPRAZOLE SODIUM 40 MG PO TBEC
40.0000 mg | DELAYED_RELEASE_TABLET | Freq: Every day | ORAL | Status: DC
  Administered 2017-07-13: 40 mg via ORAL
  Filled 2017-07-12: qty 1

## 2017-07-12 MED ORDER — LEVOTHYROXINE SODIUM 100 MCG PO TABS
100.0000 ug | ORAL_TABLET | Freq: Every day | ORAL | Status: DC
  Administered 2017-07-13: 100 ug via ORAL
  Filled 2017-07-12: qty 1

## 2017-07-12 MED ORDER — SODIUM CHLORIDE 0.9% FLUSH: 10.0000 mL | Freq: Three times a day (TID) | INTRAVENOUS | Status: DC

## 2017-07-12 MED ORDER — LORATADINE 10 MG PO TABS
10.0000 mg | ORAL_TABLET | Freq: Every day | ORAL | Status: DC
  Administered 2017-07-12 – 2017-07-13 (×2): 10 mg via ORAL
  Filled 2017-07-12 (×2): qty 1

## 2017-07-12 MED ORDER — LEVETIRACETAM 500 MG PO TABS
1500.0000 mg | ORAL_TABLET | Freq: Every day | ORAL | Status: DC
  Administered 2017-07-12: 1500 mg via ORAL
  Filled 2017-07-12: qty 3

## 2017-07-12 MED ORDER — 0.9 % SODIUM CHLORIDE (POUR BTL) OPTIME
TOPICAL | Status: DC | PRN
  Administered 2017-07-12: 1000 mL

## 2017-07-12 MED ORDER — SODIUM CHLORIDE 0.9 % IV SOLN
1.0000 g | Freq: Three times a day (TID) | INTRAVENOUS | Status: DC
  Administered 2017-07-12 – 2017-07-13 (×4): 1 g via INTRAVENOUS
  Filled 2017-07-12 (×6): qty 1

## 2017-07-12 MED ORDER — VITAMIN D (ERGOCALCIFEROL) 1.25 MG (50000 UNIT) PO CAPS: 50000.0000 [IU] | ORAL_CAPSULE | ORAL | Status: DC

## 2017-07-12 MED ORDER — ENOXAPARIN SODIUM 30 MG/0.3ML ~~LOC~~ SOLN
30.0000 mg | Freq: Two times a day (BID) | SUBCUTANEOUS | Status: DC
  Administered 2017-07-12 – 2017-07-13 (×2): 30 mg via SUBCUTANEOUS
  Filled 2017-07-12 (×2): qty 0.3

## 2017-07-12 MED ORDER — DIVALPROEX SODIUM 250 MG PO DR TAB
500.0000 mg | DELAYED_RELEASE_TABLET | Freq: Two times a day (BID) | ORAL | Status: DC
  Administered 2017-07-12 – 2017-07-13 (×2): 500 mg via ORAL
  Filled 2017-07-12 (×2): qty 2

## 2017-07-12 MED ORDER — ADULT MULTIVITAMIN W/MINERALS CH
1.0000 | ORAL_TABLET | Freq: Every day | ORAL | Status: DC
  Administered 2017-07-12 – 2017-07-13 (×2): 1 via ORAL
  Filled 2017-07-12 (×2): qty 1

## 2017-07-12 MED ORDER — DEXAMETHASONE SODIUM PHOSPHATE 10 MG/ML IJ SOLN
INTRAMUSCULAR | Status: AC
  Filled 2017-07-12: qty 1

## 2017-07-12 MED ORDER — FENTANYL CITRATE (PF) 100 MCG/2ML IJ SOLN: 25.0000 ug | INTRAMUSCULAR | Status: DC | PRN

## 2017-07-12 MED ORDER — POLYETHYL GLYCOL-PROPYL GLYCOL 0.4-0.3 % OP SOLN: 1.0000 [drp] | Freq: Three times a day (TID) | OPHTHALMIC | Status: DC | PRN

## 2017-07-12 MED ORDER — HEPARIN SOD (PORK) LOCK FLUSH 100 UNIT/ML IV SOLN
500.0000 [IU] | Freq: Three times a day (TID) | INTRAVENOUS | Status: DC
  Filled 2017-07-12 (×6): qty 5

## 2017-07-12 MED ORDER — ROSUVASTATIN CALCIUM 5 MG PO TABS
5.0000 mg | ORAL_TABLET | Freq: Every day | ORAL | Status: DC
  Administered 2017-07-12: 5 mg via ORAL
  Filled 2017-07-12: qty 1

## 2017-07-12 MED ORDER — ONDANSETRON HCL 4 MG/2ML IJ SOLN: 4.0000 mg | Freq: Once | INTRAMUSCULAR | Status: DC | PRN

## 2017-07-12 MED ORDER — POLYETHYLENE GLYCOL 3350 17 G PO PACK
17.0000 g | PACK | Freq: Every day | ORAL | Status: DC
  Administered 2017-07-13: 17 g via ORAL
  Filled 2017-07-12: qty 1

## 2017-07-12 MED ORDER — PROPOFOL 10 MG/ML IV BOLUS
INTRAVENOUS | Status: DC | PRN
  Administered 2017-07-12: 120 mg via INTRAVENOUS

## 2017-07-12 MED ORDER — DULOXETINE HCL 20 MG PO CPEP
20.0000 mg | ORAL_CAPSULE | Freq: Every day | ORAL | Status: DC
  Administered 2017-07-13: 20 mg via ORAL
  Filled 2017-07-12: qty 1

## 2017-07-12 MED ORDER — GENTAMICIN SULFATE 40 MG/ML IJ SOLN
5.0000 mg/kg | INTRAVENOUS | Status: AC
  Administered 2017-07-12: 390 mg via INTRAVENOUS
  Filled 2017-07-12: qty 9.75

## 2017-07-12 MED ORDER — SODIUM CHLORIDE 0.9 % IR SOLN
Status: DC | PRN
  Administered 2017-07-12: 6000 mL via INTRAVESICAL

## 2017-07-12 MED ORDER — LIDOCAINE 2% (20 MG/ML) 5 ML SYRINGE
INTRAMUSCULAR | Status: AC
  Filled 2017-07-12: qty 5

## 2017-07-12 MED ORDER — LEVETIRACETAM 500 MG PO TABS
1000.0000 mg | ORAL_TABLET | Freq: Every day | ORAL | Status: DC
  Administered 2017-07-13: 1000 mg via ORAL
  Filled 2017-07-12: qty 2

## 2017-07-12 MED ORDER — DIVALPROEX SODIUM 125 MG PO DR TAB
125.0000 mg | DELAYED_RELEASE_TABLET | Freq: Two times a day (BID) | ORAL | Status: DC
  Administered 2017-07-12 – 2017-07-13 (×2): 125 mg via ORAL
  Filled 2017-07-12 (×3): qty 1

## 2017-07-12 SURGICAL SUPPLY — 33 items
BAG URINE DRAINAGE (UROLOGICAL SUPPLIES) ×2 IMPLANT
BAG URO CATCHER STRL LF (MISCELLANEOUS) ×3 IMPLANT
BASKET LASER NITINOL 1.9FR (BASKET) ×1 IMPLANT
BASKET ZERO TIP NITINOL 2.4FR (BASKET) IMPLANT
BSKT STON RTRVL 120 1.9FR (BASKET)
BSKT STON RTRVL ZERO TP 2.4FR (BASKET)
CATH INTERMIT  6FR 70CM (CATHETERS) ×2 IMPLANT
CLOTH BEACON ORANGE TIMEOUT ST (SAFETY) ×1 IMPLANT
CONT SPEC 4OZ CLIKSEAL STRL BL (MISCELLANEOUS) ×2 IMPLANT
COVER FOOTSWITCH UNIV (MISCELLANEOUS) ×2 IMPLANT
COVER SURGICAL LIGHT HANDLE (MISCELLANEOUS) ×1 IMPLANT
EXTRACTOR STONE 1.7FRX115CM (UROLOGICAL SUPPLIES) ×2 IMPLANT
FIBER LASER FLEXIVA 365 (UROLOGICAL SUPPLIES) IMPLANT
FIBER LASER TRAC TIP (UROLOGICAL SUPPLIES) IMPLANT
GLOVE BIOGEL M 8.0 STRL (GLOVE) ×3 IMPLANT
GOWN STRL REUS W/ TWL XL LVL3 (GOWN DISPOSABLE) ×1 IMPLANT
GOWN STRL REUS W/TWL LRG LVL3 (GOWN DISPOSABLE) ×4 IMPLANT
GOWN STRL REUS W/TWL XL LVL3 (GOWN DISPOSABLE)
GUIDEWIRE ANG ZIPWIRE 038X150 (WIRE) ×3 IMPLANT
GUIDEWIRE STR DUAL SENSOR (WIRE) ×3 IMPLANT
HOLDER FOLEY CATH W/STRAP (MISCELLANEOUS) ×2 IMPLANT
IV NS 1000ML (IV SOLUTION)
IV NS 1000ML BAXH (IV SOLUTION) ×1 IMPLANT
MANIFOLD NEPTUNE II (INSTRUMENTS) ×3 IMPLANT
PACK CYSTO (CUSTOM PROCEDURE TRAY) ×3 IMPLANT
SHEATH URETERAL 12FRX35CM (MISCELLANEOUS) ×2 IMPLANT
STENT URET 6FRX24 CONTOUR (STENTS) ×4 IMPLANT
SYR 10ML LL (SYRINGE) ×2 IMPLANT
SYR CONTROL 10ML LL (SYRINGE) ×2 IMPLANT
TUBING CONNECTING 10 (TUBING) ×2 IMPLANT
TUBING CONNECTING 10' (TUBING) ×1
TUBING UROLOGY SET (TUBING) ×3 IMPLANT
WATER STERILE IRR 500ML POUR (IV SOLUTION) ×2 IMPLANT

## 2017-07-12 NOTE — Plan of Care (Signed)
  Problem: Education: Goal: Knowledge of General Education information will improve Outcome: Progressing   Problem: Health Behavior/Discharge Planning: Goal: Ability to manage health-related needs will improve Outcome: Progressing   Problem: Clinical Measurements: Goal: Ability to maintain clinical measurements within normal limits will improve Outcome: Progressing   Problem: Pain Managment: Goal: General experience of comfort will improve Outcome: Progressing   Problem: Safety: Goal: Ability to remain free from injury will improve Outcome: Progressing

## 2017-07-12 NOTE — Anesthesia Preprocedure Evaluation (Addendum)
Anesthesia Evaluation  Patient identified by MRN, date of birth, ID band Patient awake    Reviewed: Allergy & Precautions, NPO status , Patient's Chart, lab work & pertinent test results  Airway Mallampati: III  TM Distance: >3 FB Neck ROM: Full    Dental  (+) Teeth Intact   Pulmonary    breath sounds clear to auscultation       Cardiovascular hypertension,  Rhythm:Regular Rate:Normal     Neuro/Psych    GI/Hepatic   Endo/Other  diabetes  Renal/GU      Musculoskeletal   Abdominal (+) + obese,   Peds  Hematology   Anesthesia Other Findings Awake and alert with speech deficit  Reproductive/Obstetrics                           Anesthesia Physical Anesthesia Plan  ASA: III  Anesthesia Plan: MAC   Post-op Pain Management:    Induction: Intravenous  PONV Risk Score and Plan: Ondansetron, Dexamethasone and Propofol infusion  Airway Management Planned: Natural Airway and Nasal Cannula  Additional Equipment:   Intra-op Plan:   Post-operative Plan:   Informed Consent: I have reviewed the patients History and Physical, chart, labs and discussed the procedure including the risks, benefits and alternatives for the proposed anesthesia with the patient or authorized representative who has indicated his/her understanding and acceptance.   Dental advisory given  Plan Discussed with: CRNA and Anesthesiologist  Anesthesia Plan Comments: (H/O Malignant Hyperthermia per patient diagnosed in [place], [place] Multiple allergies plan MAC with propofol, fentanyl, versed)        Anesthesia Quick Evaluation

## 2017-07-12 NOTE — Anesthesia Procedure Notes (Signed)
Procedure Name: LMA Insertion Date/Time: 07/12/2017 7:41 AM Performed by: Genelle Bal, CRNA Pre-anesthesia Checklist: Patient identified, Emergency Drugs available, Suction available and Patient being monitored Patient Re-evaluated:Patient Re-evaluated prior to induction Oxygen Delivery Method: Circle system utilized Preoxygenation: Pre-oxygenation with 100% oxygen Induction Type: IV induction Ventilation: Mask ventilation without difficulty LMA: LMA inserted LMA Size: 4.0 Number of attempts: 1 Airway Equipment and Method: Bite block Placement Confirmation: positive ETCO2 Tube secured with: Tape Dental Injury: Teeth and Oropharynx as per pre-operative assessment

## 2017-07-12 NOTE — Op Note (Signed)
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  She has several other medical issues including a history of urolithiasis.  She presented earlier this year with pyelonephrosis from an obstructing right ureteral stone with sepsis.  She underwent urgent stenting and antibiotic management.  She underwent cystoscopy, right ureteroscopy, holmium laser lithotripsy and extraction of right ureteral calculi on 05/17/2017.  Despite preoperative antibiotic use, she developed mild sepsis postoperatively.  She was treated appropriately with meropenem for a fairly resistant E. coli infection.  It was recommended that she undergo a "second look" procedure for her right kidney in addition to left ureteroscopy to treat/manage the left renal stone to prevent further episodes of pyonephrosis.  Recent culture grew the same E. coli.  She has been on meropenem intravenously for 5 days as well as prophylactic Diflucan.  She presents at this time for repeat ureteroscopy bilaterally.  I discussed the procedure with the patient and her family member.  They understand the risks involved, as were discussed before previous procedures.  They understand and desire to proceed.  Description of procedure: The patient was properly identified in the holding area.  She received preoperative IV gentamicin.  She was taken to the operating room where general anesthetic was administered with the LMA.  She was placed in the dorsolithotomy position.  Genitalia and perineum were prepped and draped.  Proper timeout was performed.  A 22  French panendoscope was advanced into the bladder which was inspected and found to be normal except for the presence of 2 double-J stents.  The right double-J stent was grasped and brought through the urethra.  It was cannulated with a sensor tip guidewire which was easily advanced up in the right renal pelvis using fluoroscopic guidance.  I then passed first the obturator then the entire 12/14 medium length ureteral access catheter into the right ureter.  The obturator was removed.  The dual-lumen flexible ureteroscope was then passed into the renal pelvis.  The urine was somewhat murky.  Without significant pressure on irrigation, I took a sample of urine from the right renal pelvis and sent for culture.  I could not see any obvious calculi but the urine was somewhat murky.  Because of the possibility of pyuria, I did not aggressively look for stones.  I then remove the scope and replaced double-J stent after replacement of the sensor tip guidewire and removal of the ureteral access catheter.  Similar procedure was done on the left side.  The Sequentially, the stent was grasped, brought through the urethra, cannulated with a sensor tip guidewire, the stent was removed, first the obturator and then the entire 12/14 ureteral access cath was placed.  The obturator was removed and then the flexible ureteroscope passed.  Again, the urine was somewhat cloudy.  I did not run the irrigation, but I could not see the stone which had previously been noted in the lower pole of the left kidney.  Because of the possibility of pyuria again, I did not proceed with full diagnostic ureteroscopy.  The scope was extracted.  Double-J stent was then placed after guidewire was replaced.  Bilaterally, 24 cm x 6 French contour double-J stent were placed without tether's.  Once adequate positioning was noted bilaterally, the scope was removed.  37 French Foley catheter was placed and hooked to dependent drainage.  At this point, the  procedure was terminated.  The patient was awakened and taken to the PACU in stable condition.

## 2017-07-12 NOTE — Transfer of Care (Signed)
Immediate Anesthesia Transfer of Care Note  Patient: [name]  Procedure(s) Performed: CYSTOSCOPY/DIAGNOSTIC BILATERAL URETEROSCOPY/BILATERAL STENT PLACEMENT (Bilateral ) CYSTOSCOPY WITH STENT EXTRACTION (Bilateral )  Patient Location: PACU  Anesthesia Type:General  Level of Consciousness: awake, alert  and oriented  Airway & Oxygen Therapy: Patient Spontanous Breathing and Patient connected to face mask oxygen  Post-op Assessment: Report given to RN and Post -op Vital signs reviewed and stable  Post vital signs: Reviewed and stable  Last Vitals:  Vitals Value Taken Time  BP 112/77 07/12/2017  8:32 AM  Temp    Pulse 68 07/12/2017  8:33 AM  Resp 10 07/12/2017  8:33 AM  SpO2 100 % 07/12/2017  8:33 AM  Vitals shown include unvalidated device data.  Last Pain:  Vitals:   07/12/17 0647  TempSrc:   PainSc: 0-No pain         Complications: No apparent anesthesia complications

## 2017-07-12 NOTE — Anesthesia Postprocedure Evaluation (Signed)
Anesthesia Post Note  Patient: [name]  Procedure(s) Performed: CYSTOSCOPY/DIAGNOSTIC BILATERAL URETEROSCOPY/BILATERAL STENT PLACEMENT (Bilateral ) CYSTOSCOPY WITH STENT EXTRACTION (Bilateral )     Patient location during evaluation: PACU Anesthesia Type: General Level of consciousness: awake and alert Pain management: pain level controlled Vital Signs Assessment: post-procedure vital signs reviewed and stable Respiratory status: spontaneous breathing, nonlabored ventilation, respiratory function stable and patient connected to nasal cannula oxygen Cardiovascular status: blood pressure returned to baseline and stable Postop Assessment: no apparent nausea or vomiting Anesthetic complications: no    Last Vitals:  Vitals:   07/12/17 0915 07/12/17 0930  BP: 111/73 103/77  Pulse: 69 66  Resp: 13 15  Temp: 37 C 36.8 C  SpO2: 96% 99%    Last Pain:  Vitals:   07/12/17 0958  TempSrc:   PainSc: 0-No pain                 Bless Belshe COKER

## 2017-07-12 NOTE — Interval H&P Note (Signed)
History and Physical Interval Note:  07/12/2017 7:24 AM  [name]  has presented today for surgery, with the diagnosis of BILATERAL RENAL CALCULI  The various methods of treatment have been discussed with the patient and family. After consideration of risks, benefits and other options for treatment, the patient has consented to  Procedure(s): CYSTOSCOPY/BILATERAL STENT REMOVAL /BILATERAL URETEROSCOPY/LASER LITHOTRIPSY/BILATERAL STENT PLACEMENT (Bilateral) CYSTOSCOPY WITH STENT REMOVAL (Bilateral) as a surgical intervention .  The patient's history has been reviewed, patient examined, no change in status, stable for surgery.  I have reviewed the patient's chart and labs.  Questions were answered to the patient's satisfaction.     [name] Roberta Angell

## 2017-07-12 NOTE — H&P (Signed)
H&P  Chief Complaint: Left renal calculus  History of Present Illness: [name] is a 62 y.o. year old female who [presents for ureteroscopic mgmt of a left renal stone, as well as repeat right ureteroscopy to clear any remaining stones form prior ureteroscopic mgmt with HLL,stone extraction/stent placement on 05/17/2017. She has been on meropenem for known resistant E.coli colonization. This was started approximately 5 days ago.   She has a h/o CVA w/ hemiparesis as well as multiple other medical issues. She resides in a SNF. Current aggressive care is warranted to prevent recurrent acute episodes of ureteral obstruction/pyonephrosis.  Past Medical History:  Diagnosis Date  . Acute kidney failure (HCC)    hx of   . Acute respiratory failure ([place]) 07/27/2012  . Arthritis    OSTEO LEFT LEG  . Bilateral cataracts   . Candidiasis of skin and nail   . Chronic pain   . Chronic pain syndrome   . Constipation   . Contractures involving both knees    and ankles   . CVA (cerebral vascular accident) ([place])    left sided hemiparesis  . Depression   . Diabetes mellitus without complication (HCC)    type II, DIET CONTROLLED  . Dry eye syndrome of bilateral lacrimal glands   . Dysphagia   . Feeding difficulties   . 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 GERD (gastroesophageal reflux disease)   . Hemiparesis affecting left side as late effect of cerebrovascular accident ([place])   . History of kidney stones   . Hyperlipidemia   . Hypertension   . Metabolic encephalopathy 50/11/3816  . Morbid obesity ([place])   . Muscle weakness (generalized)   . Narcotic overdose ([place]) 07/27/2012  . Parkinson's disease ([place])   . Peripheral vascular disease ([place])   . Polymyalgia rheumatica ([place])   . Primary insomnia   . Respiratory failure ([place]) 05/06/10  . Seizure disorder ([place])   . Sepsis ([place])    hx of   . Sleep apnea 04/2010   on CPAP, "severe central sleep apnea"  . Spasticity    chronic  . Thrombocytopenia  ([place])    related to depakote  . Thrombocytopenia ([place])   . Tinea unguium   . Unspecified hypothyroidism   . 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 Urinary incontinence   . UTI (lower urinary tract infection) 05/06/10    Past Surgical History:  Procedure Laterality Date  . CEREBRAL ANEURYSM REPAIR  1999  . CYSTOSCOPY W/ URETERAL STENT PLACEMENT Bilateral 04/22/2017   Procedure: CYSTOSCOPY WITH BILATERAL  RETROGRADE PYELOGRAM, RIGHT URETERAL STENT PLACEMENT;  Surgeon: Festus Aloe, MD;  Location: [facility];  Service: Urology;  Laterality: Bilateral;  . CYSTOSCOPY W/ URETERAL STENT REMOVAL Right 05/17/2017   Procedure: CYSTOSCOPY WITH STENT REMOVAL;  Surgeon: Franchot Gallo, MD;  Location: [facility];  Service: Urology;  Laterality: Right;  . CYSTOSCOPY/URETEROSCOPY/HOLMIUM LASER/STENT PLACEMENT Right 05/17/2017   Procedure: CYSTOSCOPY/URETEROSCOPY/HOLMIUM LASER/STENT PLACEMENT/BILATERAL STENT PLACEMENTS ,RIGHT STONE EXTRACTION;  Surgeon: Franchot Gallo, MD;  Location: [facility];  Service: Urology;  Laterality: Right;  . INTRATHECAL PUMP IMPLANTATION  2010   [employer]:  fentanyl, baclofen changed to morphine and baclofen on 07/2012  . PICC LINE INSERTION     RIGHT ARM    Home Medications:  Medications Prior to Admission  Medication Sig Dispense Refill  . acetaminophen (TYLENOL) 325 MG tablet Take 2 tablets (650 mg total) by mouth every 4 (  four) hours as needed for mild pain (or temp > 37.5 C (99.5 F)). 30 tablet 2  . amLODipine (NORVASC) 10 MG tablet Take 1 tablet (10 mg total) by mouth daily. 30 tablet 3  . aspirin EC 81 MG tablet Take 81 mg by mouth daily.    . cephALEXin (KEFLEX) 500 MG capsule Take 500 mg by mouth daily.    . cloNIDine (CATAPRES) 0.1 MG tablet Take 0.1 mg by mouth every 6 (six) hours as needed (for SBP greater than 160).    Marland Kitchen divalproex (DEPAKOTE) 125 MG DR tablet Take 125 mg by mouth 2  (two) times daily.    . divalproex (DEPAKOTE) 500 MG DR tablet Take 500 mg by mouth 2 (two) times daily.    . DULoxetine (CYMBALTA) 20 MG capsule Take 20 mg by mouth daily.    . DULoxetine (CYMBALTA) 60 MG capsule Take 60 mg by mouth daily.    . furosemide (LASIX) 20 MG tablet Take 20 mg by mouth 2 (two) times daily.    . heparin lock flush 100 UNIT/ML SOLN injection Inject 500 Units into the vein 3 (three) times daily. Every shift    . labetalol (NORMODYNE) 200 MG tablet Take 200 mg by mouth 2 (two) times daily.    Marland Kitchen lactulose, encephalopathy, (CHRONULAC) 10 GM/15ML SOLN Take 20 g by mouth daily.     Marland Kitchen levETIRAcetam (KEPPRA) 500 MG tablet Take 1,000-1,500 mg by mouth See admin instructions. Take 1000 mg by mouth in the morning and take 1500 mg by mouth at bedtime    . levothyroxine (SYNTHROID, LEVOTHROID) 100 MCG tablet Take 100 mcg by mouth daily before breakfast.    . lidocaine (LIDODERM) 5 % Place 1 patch onto the skin daily. Remove & Discard patch within 12 hours or as directed by MD    . loratadine (CLARITIN) 10 MG tablet Take 10 mg by mouth daily.    Marland Kitchen losartan (COZAAR) 50 MG tablet Take 50 mg by mouth at bedtime.    . meropenem 1 g in sodium chloride 0.9 % 100 mL Inject 1 g into the vein every 8 (eight) hours.  0  . Multiple Vitamin (MULTIVITAMIN WITH MINERALS) TABS tablet Take 1 tablet by mouth daily.    . Olopatadine HCl (PATADAY) 0.2 % SOLN Place 1 drop into both eyes at bedtime.    Marland Kitchen omeprazole (PRILOSEC) 20 MG capsule Take 20 mg by mouth daily.    Marland Kitchen oxyCODONE (OXY IR/ROXICODONE) 5 MG immediate release tablet Take 1 tablet (5 mg total) by mouth every 8 (eight) hours as needed. (Patient taking differently: Take 5 mg by mouth every 8 (eight) hours as needed for severe pain. ) 15 tablet 0  . Polyethyl Glycol-Propyl Glycol (SYSTANE) 0.4-0.3 % SOLN Place 1 drop into both eyes every 8 (eight) hours as needed (for dry eyes).     . polyethylene glycol (MIRALAX / GLYCOLAX) packet Take 17 g by  mouth daily.    . rosuvastatin (CRESTOR) 5 MG tablet Take 5 mg by mouth at bedtime.    . Sodium Chloride Flush (NORMAL SALINE FLUSH) 0.9 % SOLN Inject 10 mLs into the vein 3 (three) times daily. Every shift    . traZODone (DESYREL) 50 MG tablet Take 50 mg by mouth at bedtime.    . Vitamin D, Ergocalciferol, (DRISDOL) 50000 units CAPS capsule Take 50,000 Units by mouth See admin instructions. Take 50000 units by mouth one time a day starting on the 1st and ending on the  2nd every month    . Menthol, Topical Analgesic, 5 % GEL Apply 1 application topically every 6 (six) hours as needed (for pain).       Allergies:  Allergies  Allergen Reactions  . Codeine Other (See Comments)    unknown  . Lacosamide Other (See Comments)    Mood changes/agitation  . Morphine Other (See Comments)    No rx noted    . Zolpidem Other (See Comments)    Per MAR    History reviewed. No pertinent family history.  Social History:  reports that she has never smoked. She has never used smokeless tobacco. She reports that she does not drink alcohol or use drugs.  ROS: A complete review of systems was performed.  All systems are negative except for pertinent findings as noted.  Physical Exam:  Vital signs in last 24 hours: Weight:  [122.9 kg (270 lb 15.1 oz)] 122.9 kg (270 lb 15.1 oz) (05/06 0549) General:  Alert and oriented, No acute distress HEENT: Normocephalic, atraumatic Neck: No JVD or lymphadenopathy Cardiovascular: Regular rate and rhythm Lungs: Clear bilaterally Abdomen: Soft, nontender, nondistended, no abdominal masses Back: No CVA tenderness Extremities: No edema Neurologic: Grossly intact  Laboratory Data:  Results for orders placed or performed during the hospital encounter of 07/12/17 (from the past 24 hour(s))  Glucose, capillary     Status: Abnormal   Collection Time: 07/12/17  6:11 AM  Result Value Ref Range   Glucose-Capillary 101 (H) 65 - 99 mg/dL   Comment 1 Notify RN    No  results found for this or any previous visit (from the past 240 hour(s)). Creatinine: No results for input(s): CREATININE in the last 168 hours.  Radiologic Imaging: No results found.  Impression/Assessment:  Bilateral renal calculi--s/p right URS/HLL/stone extraction/stent placement.  Plan:  "2nd look" right URS/stone extraction, left URS/HLL/stone extraction, J2 stents  [name] Betrice Wanat 07/12/2017, 6:14 AM  [name]. Vivienne Sangiovanni MD

## 2017-07-13 ENCOUNTER — Encounter (HOSPITAL_COMMUNITY): Payer: Self-pay | Admitting: Urology

## 2017-07-13 DIAGNOSIS — N200 Calculus of kidney: Secondary | ICD-10-CM | POA: Diagnosis not present

## 2017-07-13 LAB — CBC WITH DIFFERENTIAL/PLATELET
BASOS PCT: 0 %
Basophils Absolute: 0 10*3/uL (ref 0.0–0.1)
Eosinophils Absolute: 0 10*3/uL (ref 0.0–0.7)
Eosinophils Relative: 0 %
HEMATOCRIT: 34 % — AB (ref 36.0–46.0)
HEMOGLOBIN: 9.9 g/dL — AB (ref 12.0–15.0)
LYMPHS PCT: 35 %
Lymphs Abs: 3.1 10*3/uL (ref 0.7–4.0)
MCH: 28.2 pg (ref 26.0–34.0)
MCHC: 29.1 g/dL — AB (ref 30.0–36.0)
MCV: 96.9 fL (ref 78.0–100.0)
MONOS PCT: 7 %
Monocytes Absolute: 0.7 10*3/uL (ref 0.1–1.0)
NEUTROS ABS: 5.2 10*3/uL (ref 1.7–7.7)
NEUTROS PCT: 58 %
Platelets: 263 10*3/uL (ref 150–400)
RBC: 3.51 MIL/uL — ABNORMAL LOW (ref 3.87–5.11)
RDW: 15.3 % (ref 11.5–15.5)
WBC: 9 10*3/uL (ref 4.0–10.5)

## 2017-07-13 LAB — BASIC METABOLIC PANEL
ANION GAP: 10 (ref 5–15)
BUN: 15 mg/dL (ref 6–20)
CHLORIDE: 105 mmol/L (ref 101–111)
CO2: 28 mmol/L (ref 22–32)
Calcium: 8.6 mg/dL — ABNORMAL LOW (ref 8.9–10.3)
Creatinine, Ser: 0.79 mg/dL (ref 0.44–1.00)
GFR calc Af Amer: >60 mL/min (ref >60)
GFR calc non Af Amer: >60 mL/min (ref >60)
GLUCOSE: 132 mg/dL — AB (ref 65–99)
Potassium: 4.1 mmol/L (ref 3.5–5.1)
Sodium: 143 mmol/L (ref 135–145)

## 2017-07-13 LAB — URINE CULTURE: CULTURE: NO GROWTH

## 2017-07-13 LAB — GLUCOSE, CAPILLARY: Glucose-Capillary: 111 mg/dL — ABNORMAL HIGH (ref 65–99)

## 2017-07-13 MED ORDER — HEPARIN SOD (PORK) LOCK FLUSH 100 UNIT/ML IV SOLN
250.0000 [IU] | INTRAVENOUS | Status: AC | PRN
  Administered 2017-07-13: 250 [IU]

## 2017-07-13 NOTE — NC FL2 (Signed)
[place] MEDICAID FL2 LEVEL OF CARE SCREENING TOOL     IDENTIFICATION  Patient Name: [name] Birthdate: 1955-08-23 Sex: female Admission Date (Current Location): 07/12/2017  [facility] and [place] Number:  Engineer, manufacturing systems and Address:  [facility],  [place] [address], [place]      Provider Number: [id number]  Attending Physician Name and Address:  Franchot Gallo, MD  Relative Name and Phone Number:       Current Level of Care: Hospital Recommended Level of Care: [place] Prior Approval Number:    Date Approved/Denied:   PASRR Number: [id number]  Discharge Plan: SNF    Current Diagnoses: Patient Active Problem List   Diagnosis Date Noted  . Renal stone 07/12/2017  . Toxic encephalopathy 05/20/2017  . Pyelonephritis 05/20/2017  . ESBL (extended spectrum beta-lactamase) producing bacteria infection 05/20/2017  . Renal calculus 05/17/2017  . Severe sepsis with septic shock ([place]) 04/23/2017  . AKI (acute kidney injury) ([place]) 04/21/2017  . CVA (cerebral vascular accident) ([place]) 04/21/2017  . HTN (hypertension) 11/21/2012  . Lower urinary tract infectious disease 11/18/2012  . Sepsis ([place]) 11/18/2012  . Thrombocytopenia, unspecified ([place]) 07/28/2012  . Central sleep apnea 07/28/2012  . Hemiparesis affecting left side as late effect of cerebrovascular accident ([place]) 07/28/2012  . Hypothyroidism 07/28/2012  . Hypercapnia 07/28/2012  . Seizure disorder ([place]) 07/28/2012  . Narcotic overdose ([place]) 07/27/2012  . Acute respiratory failure ([place]) 07/27/2012  . Morbid obesity ([place]) 07/27/2012  . History of stroke 07/27/2012  . Chronic pain disorder 07/27/2012    Orientation RESPIRATION BLADDER Height & Weight     Self, Time, Situation, Place  O2 Indwelling catheter Weight: 270 lb 15.1 oz (122.9 kg) Height:  5\' 1"  (154.9 cm)  BEHAVIORAL SYMPTOMS/MOOD NEUROLOGICAL BOWEL NUTRITION STATUS        Diet(see dc summary)   AMBULATORY STATUS COMMUNICATION OF NEEDS Skin   Total Care Verbally Normal                       Personal Care Assistance Level of Assistance  Bathing, Feeding, Dressing Bathing Assistance: Maximum assistance Feeding assistance: Maximum assistance Dressing Assistance: Maximum assistance     Functional Limitations Info  Sight, Hearing, Speech Sight Info: Adequate Hearing Info: Adequate Speech Info: Adequate    SPECIAL CARE FACTORS FREQUENCY  PT (By licensed PT), OT (By licensed OT)                    Contractures      Additional Factors Info  Code Status, Allergies, Isolation Precautions Code Status Info: Full Code Allergies Info: Codeine;Lacosamide;Morphine;Zolpidem     Isolation Precautions Info: Contact Precautions  Infection:ESBL     Current Medications (07/13/2017):  This is the current hospital active medication list Current Facility-Administered Medications  Medication Dose Route Frequency Provider Last Rate Last Dose  . 0.45 % sodium chloride infusion   Intravenous Continuous Franchot Gallo, MD 50 mL/hr at 07/13/17 0550    . acetaminophen (TYLENOL) tablet 650 mg  650 mg Oral Q4H PRN Franchot Gallo, MD   650 mg at 07/12/17 1729  . amLODipine (NORVASC) tablet 10 mg  10 mg Oral Daily Franchot Gallo, MD   10 mg at 07/13/17 1006  . aspirin EC tablet 81 mg  81 mg Oral Daily Franchot Gallo, MD   81 mg at 07/13/17 1005  . cloNIDine (CATAPRES) tablet 0.1 mg  0.1 mg Oral Q6H PRN Franchot Gallo, MD      .  divalproex (DEPAKOTE) DR tablet 125 mg  125 mg Oral BID Franchot Gallo, MD   125 mg at 07/13/17 1005  . divalproex (DEPAKOTE) DR tablet 500 mg  500 mg Oral BID Franchot Gallo, MD   500 mg at 07/13/17 1006  . DULoxetine (CYMBALTA) DR capsule 20 mg  20 mg Oral Daily Franchot Gallo, MD   20 mg at 07/13/17 1005  . DULoxetine (CYMBALTA) DR capsule 60 mg  60 mg Oral Daily Franchot Gallo, MD   60 mg at 07/13/17 1006  . enoxaparin  (LOVENOX) injection 30 mg  30 mg Subcutaneous Q12H Franchot Gallo, MD   30 mg at 07/13/17 1006  . furosemide (LASIX) tablet 20 mg  20 mg Oral BID Franchot Gallo, MD   20 mg at 07/13/17 1005  . heparin lock flush 100 unit/mL  500 Units Intravenous TID Franchot Gallo, MD      . labetalol (NORMODYNE) tablet 200 mg  200 mg Oral BID Franchot Gallo, MD   200 mg at 07/13/17 1006  . lactulose (CHRONULAC) 10 GM/15ML solution 20 g  20 g Oral Daily Franchot Gallo, MD   20 g at 07/13/17 1007  . levETIRAcetam (KEPPRA) tablet 1,000 mg  1,000 mg Oral Daily Franchot Gallo, MD   1,000 mg at 07/13/17 1005  . levETIRAcetam (KEPPRA) tablet 1,500 mg  1,500 mg Oral QHS Franchot Gallo, MD   1,500 mg at 07/12/17 2122  . levothyroxine (SYNTHROID, LEVOTHROID) tablet 100 mcg  100 mcg Oral QAC breakfast Franchot Gallo, MD   100 mcg at 07/13/17 1006  . loratadine (CLARITIN) tablet 10 mg  10 mg Oral Daily Franchot Gallo, MD   10 mg at 07/13/17 1005  . losartan (COZAAR) tablet 50 mg  50 mg Oral QHS Franchot Gallo, MD   50 mg at 07/12/17 2122  . meropenem (MERREM) 1 g in sodium chloride 0.9 % 100 mL IVPB  1 g Intravenous Q8H Franchot Gallo, MD   Stopped at 07/13/17 [phone]  . multivitamin with minerals tablet 1 tablet  1 tablet Oral Daily Franchot Gallo, MD   1 tablet at 07/13/17 1005  . oxyCODONE (Oxy IR/ROXICODONE) immediate release tablet 5 mg  5 mg Oral Q8H PRN Franchot Gallo, MD   5 mg at 07/12/17 1129  . pantoprazole (PROTONIX) EC tablet 40 mg  40 mg Oral Daily Franchot Gallo, MD   40 mg at 07/13/17 1005  . polyethylene glycol (MIRALAX / GLYCOLAX) packet 17 g  17 g Oral Daily Franchot Gallo, MD   17 g at 07/13/17 1006  . polyvinyl alcohol (LIQUIFILM TEARS) 1.4 % ophthalmic solution 1 drop  1 drop Both Eyes PRN Franchot Gallo, MD      . rosuvastatin (CRESTOR) tablet 5 mg  5 mg Oral QHS Franchot Gallo, MD   5 mg at 07/12/17 2122  . sodium chloride flush (NS) 0.9  % injection 10-40 mL  10-40 mL Intracatheter PRN Franchot Gallo, MD      . traZODone (DESYREL) tablet 50 mg  50 mg Oral QHS Franchot Gallo, MD   50 mg at 07/12/17 2122  . [START ON 08/07/2017] Vitamin D (Ergocalciferol) (DRISDOL) capsule 50,000 Units  50,000 Units Oral Q30 days Franchot Gallo, MD         Discharge Medications: Please see discharge summary for a list of discharge medications.  Relevant Imaging Results:  Relevant Lab Results:   Additional Information [id number]  Burnis Medin, LCSW

## 2017-07-13 NOTE — Clinical Social Work Placement (Signed)
Patient returning to [facility]. Facility aware of patient's discharge and confirmed patient's ability to return. PTAR contacted, patient's family notified. Patient's RN can call report to [phone], packet complete. CSW signing off, no other needs identified at this time.  CLINICAL SOCIAL WORK PLACEMENT  NOTE  Date:  07/13/2017  Patient Details  Name: [name] MRN: [id number] Date of Birth: 08-18-1955  Clinical Social Work is seeking post-discharge placement for this patient at the [place] level of care (*CSW will initial, date and re-position this form in  chart as items are completed):  Yes   Patient/family provided with [place] Work Department's list of facilities offering this level of care within the geographic area requested by the patient (or if unable, by the patient's family).  Yes   Patient/family informed of their freedom to choose among providers that offer the needed level of care, that participate in Medicare, Medicaid or managed care program needed by the patient, have an available bed and are willing to accept the patient.  Yes   Patient/family informed of [facility]'s ownership interest in [facility] and [facility], as well as of the fact that they are under no obligation to receive care at these facilities.  PASRR submitted to EDS on       PASRR number received on       Existing PASRR number confirmed on 07/13/17     FL2 transmitted to all facilities in geographic area requested by pt/family on 07/13/17     FL2 transmitted to all facilities within larger geographic area on       Patient informed that his/her managed care company has contracts with or will negotiate with certain facilities, including the following:        Yes   Patient/family informed of bed offers received.  Patient chooses bed at [facility]     Physician recommends and patient chooses bed at      Patient to be transferred to  [facility] on 07/13/17.  Patient to be transferred to facility by PTAR     Patient family notified on 07/13/17 of transfer.  Name of family member notified:  Marilynne Halsted     PHYSICIAN       Additional Comment:    _______________________________________________ Burnis Medin, LCSW 07/13/2017, 3:55 PM

## 2017-07-13 NOTE — Clinical Social Work Note (Signed)
Clinical Social Work Assessment  Patient Details  Name: [name] MRN: [id number] Date of Birth: Sep 29, 1955  Date of referral:  07/13/17               Reason for consult:  Discharge Planning                Permission sought to share information with:  Facility Sport and exercise psychologist, Family Supports Permission granted to share information::  Yes, Verbal Permission Granted  Name::     [name]  Agency::     Relationship::  daughter  Contact Information:  [phone]  Housing/Transportation Living arrangements for the past 2 months:  Skilled Nursing Facility([facility]) Source of Information:  Patient Patient Interpreter Needed:  None Criminal Activity/Legal Involvement Pertinent to Current Situation/Hospitalization:  No - Comment as needed Significant Relationships:  Adult Children, Siblings, Other Family Members Lives with:  Facility Resident Do you feel safe going back to the place where you live?  Yes Need for family participation in patient care:  No (Coment)  Care giving concerns:  Patient is a [name] term care resident at [facility]. Patient is total care at baseline. Patient admitted for procedure, with the diagnosis of bilateral renal calculi.    Social Worker assessment / plan:  CSW spoke with patient at bedside regarding discharge planning. Patient verbalized plan to discharge back to [facility] for [name] term care. Patient requested that CSW contact her daughter when transportation is being set up.  CSW completed patient's FL2.  CSW contacted [name] SN[facility] to confirm patient's ability to return. CSW spoke with [facility] SNF admissions staff [name], staff confirmed patient's ability to return.   CSW will continue to follow and assist with discharge.  Employment status:  Disabled (Comment on whether or not currently receiving Disability) Insurance information:  Programmer, applications, Medicaid In [place] PT Recommendations:  Not assessed at  this time Information / Referral to community resources:  Other (Comment Required)(Patient is a [name] term care resident at [facility])  Patient/Family's Response to care:  Patient appreciative of CSW assistance with discharge planning.   Patient/Family's Understanding of and Emotional Response to Diagnosis, Current Treatment, and Prognosis:  Patient presented pleasant and verbalized agreement with current treatment plan. Patient verbalized plan to dc back to SNF for [name] term care.   Emotional Assessment Appearance:  Appears stated age Attitude/Demeanor/Rapport:  Engaged Affect (typically observed):  Pleasant Orientation:  Oriented to Self, Oriented to Place, Oriented to  Time, Oriented to Situation Alcohol / Substance use:  Not Applicable Psych involvement (Current and /or in the community):  No (Comment)  Discharge Needs  Concerns to be addressed:  Care Coordination Readmission within the last 30 days:  No Current discharge risk:  None Barriers to Discharge:  No Barriers Identified   Rita Medin, LCSW 07/13/2017, 10:45 AM

## 2017-07-13 NOTE — Progress Notes (Signed)
Called report to [facility] to [place], Therapist, sports. Next dose of patient's antibiotic due at 1400 and once completed, IV team will be notified to flush/cap PICC line and then patient will be ready for d/c. RN remaining in contact with SW regarding discharge plan.

## 2017-07-13 NOTE — Discharge Summary (Signed)
Patient ID: [name] MRN: [id number] DOB/AGE: Jul 19, 1955 62 y.o.  Admit date: 07/12/2017 Discharge date: 07/13/2017  Primary Care Physician:  Hilbert Corrigan, MD  Discharge Diagnoses:  Renal calcului Urinary tract infection  Consults: None   Discharge Medications:    Significant Diagnostic Studies:  Dg C-arm 1-60 Min-no Report  Result Date: 07/12/2017 Fluoroscopy was utilized by the requesting physician.  No radiographic interpretation.    Brief H and P: For complete details please refer to admission H and P, but in brief the pt was admitted for ureteroscopic mgmt of bilateral renal calculi--she has been on meropenem for resistant e coli uti prior to admission.  Hospital Course: Pt was brought to OR for ureteroscopy,but she had cloudy urine in both renal pelves, so procedure was aborted. She will be brought back later following culture ID and Rx of probable resistant UTI. Active Problems:   Renal stone   Day of Discharge BP 111/66 (BP Location: Left Arm)   Pulse 74   Temp 97.7 F (36.5 C) (Oral)   Resp 18   Ht 5\' 1"  (1.549 m)   Wt 122.9 kg (270 lb 15.1 oz)   SpO2 100%   BMI 51.19 kg/m   Results for orders placed or performed during the hospital encounter of 07/12/17 (from the past 24 hour(s))  Glucose, capillary     Status: Abnormal   Collection Time: 07/12/17  8:46 AM  Result Value Ref Range   Glucose-Capillary 112 (H) 65 - 99 mg/dL  Glucose, capillary     Status: Abnormal   Collection Time: 07/12/17 11:58 AM  Result Value Ref Range   Glucose-Capillary 127 (H) 65 - 99 mg/dL  Glucose, capillary     Status: Abnormal   Collection Time: 07/12/17  5:18 PM  Result Value Ref Range   Glucose-Capillary 133 (H) 65 - 99 mg/dL  Glucose, capillary     Status: Abnormal   Collection Time: 07/12/17 10:19 PM  Result Value Ref Range   Glucose-Capillary 133 (H) 65 - 99 mg/dL  CBC WITH DIFFERENTIAL     Status: Abnormal   Collection Time: 07/13/17  4:18 AM  Result  Value Ref Range   WBC 9.0 4.0 - 10.5 K/uL   RBC 3.51 (L) 3.87 - 5.11 MIL/uL   Hemoglobin 9.9 (L) 12.0 - 15.0 g/dL   HCT 34.0 (L) 36.0 - 46.0 %   MCV 96.9 78.0 - 100.0 fL   MCH 28.2 26.0 - 34.0 pg   MCHC 29.1 (L) 30.0 - 36.0 g/dL   RDW 15.3 11.5 - 15.5 %   Platelets 263 150 - 400 K/uL   Neutrophils Relative % 58 %   Neutro Abs 5.2 1.7 - 7.7 K/uL   Lymphocytes Relative 35 %   Lymphs Abs 3.1 0.7 - 4.0 K/uL   Monocytes Relative 7 %   Monocytes Absolute 0.7 0.1 - 1.0 K/uL   Eosinophils Relative 0 %   Eosinophils Absolute 0.0 0.0 - 0.7 K/uL   Basophils Relative 0 %   Basophils Absolute 0.0 0.0 - 0.1 K/uL  Basic metabolic panel     Status: Abnormal   Collection Time: 07/13/17  4:18 AM  Result Value Ref Range   Sodium 143 135 - 145 mmol/L   Potassium 4.1 3.5 - 5.1 mmol/L   Chloride 105 101 - 111 mmol/L   CO2 28 22 - 32 mmol/L   Glucose, Bld 132 (H) 65 - 99 mg/dL   BUN 15 6 - 20 mg/dL  Creatinine, Ser 0.79 0.44 - 1.00 mg/dL   Calcium 8.6 (L) 8.9 - 10.3 mg/dL   GFR calc non Af Amer >60 >60 mL/min   GFR calc Af Amer >60 >60 mL/min   Anion gap 10 5 - 15    Physical Exam: General: Alert and awake oriented x3 not in any acute distress. HEENT: anicteric sclera, pupils reactive to light and accommodation CVS: S1-S2 clear no murmur rubs or gallops Chest: clear to auscultation bilaterally, no wheezing rales or rhonchi Abdomen: soft nontender, nondistended, normal bowel sounds, no organomegaly Extremities: no cyanosis, clubbing or edema noted bilaterally Neuro: Cranial nerves II-XII intact, no focal neurological deficits  Disposition:  SNF  Diet:  Regular  Activity:  routine   Disposition and Follow-up:    Will arrange  TESTS THAT NEED FOLLOW-UP  urine culture  DISCHARGE FOLLOW-UP   Time spent on Discharge:  15 mins  Signed: Jorja Loa 07/13/2017, 6:49 AM

## 2017-07-13 NOTE — Care Management Note (Signed)
Case Management Note  Patient Details  Name: [name] MRN: [id number] Date of Birth: 1955-07-23  Subjective/Objective:                    Action/Plan:   Expected Discharge Date:  07/13/17               Expected Discharge Plan:  Skilled Nursing Facility  In-House Referral:  Clinical Social Work  Discharge planning Services  CM Consult  Post Acute Care Choice:    Choice offered to:     DME Arranged:    DME Agency:     HH Arranged:    [place] Agency:     Status of Service:  Completed, signed off  If discussed at [employer], dates discussed:    Additional CommentsPurcell Mouton, RN 07/13/2017, 2:36 PM

## 2017-07-13 NOTE — Discharge Summary (Signed)
Patient ID: [name] MRN: [id number] DOB/AGE: October 05, 1955 62 y.o.  Admit date: 07/12/2017 Discharge date: 07/13/2017  Primary Care Physician:  Hilbert Corrigan, MD  Discharge Diagnoses:  Renal calculi, UTI    Discharge Medications: Allergies as of 07/13/2017      Reactions   Codeine Other (See Comments)   unknown   Lacosamide Other (See Comments)   Mood changes/agitation   Morphine Other (See Comments)   No rx noted    Zolpidem Other (See Comments)   Per MAR      Medication List    STOP taking these medications   cephALEXin 500 MG capsule Commonly known as:  KEFLEX     TAKE these medications   acetaminophen 325 MG tablet Commonly known as:  TYLENOL Take 2 tablets (650 mg total) by mouth every 4 (four) hours as needed for mild pain (or temp > 37.5 C (99.5 F)).   amLODipine 10 MG tablet Commonly known as:  NORVASC Take 1 tablet (10 mg total) by mouth daily.   aspirin EC 81 MG tablet Take 81 mg by mouth daily.   cloNIDine 0.1 MG tablet Commonly known as:  CATAPRES Take 0.1 mg by mouth every 6 (six) hours as needed (for SBP greater than 160).   divalproex 125 MG DR tablet Commonly known as:  DEPAKOTE Take 125 mg by mouth 2 (two) times daily.   divalproex 500 MG DR tablet Commonly known as:  DEPAKOTE Take 500 mg by mouth 2 (two) times daily.   DULoxetine 20 MG capsule Commonly known as:  CYMBALTA Take 20 mg by mouth daily.   DULoxetine 60 MG capsule Commonly known as:  CYMBALTA Take 60 mg by mouth daily.   furosemide 20 MG tablet Commonly known as:  LASIX Take 20 mg by mouth 2 (two) times daily.   heparin lock flush 100 UNIT/ML Soln injection Inject 500 Units into the vein 3 (three) times daily. Every shift   labetalol 200 MG tablet Commonly known as:  NORMODYNE Take 200 mg by mouth 2 (two) times daily.   lactulose (encephalopathy) 10 GM/15ML Soln Commonly known as:  CHRONULAC Take 20 g by mouth daily.   levETIRAcetam 500 MG  tablet Commonly known as:  KEPPRA Take 1,000-1,500 mg by mouth See admin instructions. Take 1000 mg by mouth in the morning and take 1500 mg by mouth at bedtime   levothyroxine 100 MCG tablet Commonly known as:  SYNTHROID, LEVOTHROID Take 100 mcg by mouth daily before breakfast.   lidocaine 5 % Commonly known as:  LIDODERM Place 1 patch onto the skin daily. Remove & Discard patch within 12 hours or as directed by MD   loratadine 10 MG tablet Commonly known as:  CLARITIN Take 10 mg by mouth daily.   losartan 50 MG tablet Commonly known as:  COZAAR Take 50 mg by mouth at bedtime.   Menthol (Topical Analgesic) 5 % Gel Apply 1 application topically every 6 (six) hours as needed (for pain).   meropenem 1 g in sodium chloride 0.9 % 100 mL Inject 1 g into the vein every 8 (eight) hours.   multivitamin with minerals Tabs tablet Take 1 tablet by mouth daily.   Normal Saline Flush 0.9 % Soln Inject 10 mLs into the vein 3 (three) times daily. Every shift   omeprazole 20 MG capsule Commonly known as:  PRILOSEC Take 20 mg by mouth daily.   oxyCODONE 5 MG immediate release tablet Commonly known as:  Oxy IR/ROXICODONE Take 1  tablet (5 mg total) by mouth every 8 (eight) hours as needed. What changed:  reasons to take this   PATADAY 0.2 % Soln Generic drug:  Olopatadine HCl Place 1 drop into both eyes at bedtime.   polyethylene glycol packet Commonly known as:  MIRALAX / GLYCOLAX Take 17 g by mouth daily.   rosuvastatin 5 MG tablet Commonly known as:  CRESTOR Take 5 mg by mouth at bedtime.   SYSTANE 0.4-0.3 % Soln Generic drug:  Polyethyl Glycol-Propyl Glycol Place 1 drop into both eyes every 8 (eight) hours as needed (for dry eyes).   traZODone 50 MG tablet Commonly known as:  DESYREL Take 50 mg by mouth at bedtime.   Vitamin D (Ergocalciferol) 50000 units Caps capsule Commonly known as:  DRISDOL Take 50,000 Units by mouth See admin instructions. Take 50000 units by  mouth one time a day starting on the 1st and ending on the 2nd every month        Significant Diagnostic Studies:  Dg C-arm 1-60 Min-no Report  Result Date: 07/12/2017 Fluoroscopy was utilized by the requesting physician.  No radiographic interpretation.    Brief H and P: For complete details please refer to admission H and P, but in brief admitted for stone [facility] Course:  Active Problems:   Renal stone   Day of Discharge BP 111/66 (BP Location: Left Arm)   Pulse 74   Temp 97.7 F (36.5 C) (Oral)   Resp 18   Ht 5\' 1"  (1.549 m)   Wt 122.9 kg (270 lb 15.1 oz)   SpO2 100%   BMI 51.19 kg/m   Results for orders placed or performed during the hospital encounter of 07/12/17 (from the past 24 hour(s))  Glucose, capillary     Status: Abnormal   Collection Time: 07/12/17 11:58 AM  Result Value Ref Range   Glucose-Capillary 127 (H) 65 - 99 mg/dL  Glucose, capillary     Status: Abnormal   Collection Time: 07/12/17  5:18 PM  Result Value Ref Range   Glucose-Capillary 133 (H) 65 - 99 mg/dL  Glucose, capillary     Status: Abnormal   Collection Time: 07/12/17 10:19 PM  Result Value Ref Range   Glucose-Capillary 133 (H) 65 - 99 mg/dL  CBC WITH DIFFERENTIAL     Status: Abnormal   Collection Time: 07/13/17  4:18 AM  Result Value Ref Range   WBC 9.0 4.0 - 10.5 K/uL   RBC 3.51 (L) 3.87 - 5.11 MIL/uL   Hemoglobin 9.9 (L) 12.0 - 15.0 g/dL   HCT 34.0 (L) 36.0 - 46.0 %   MCV 96.9 78.0 - 100.0 fL   MCH 28.2 26.0 - 34.0 pg   MCHC 29.1 (L) 30.0 - 36.0 g/dL   RDW 15.3 11.5 - 15.5 %   Platelets 263 150 - 400 K/uL   Neutrophils Relative % 58 %   Neutro Abs 5.2 1.7 - 7.7 K/uL   Lymphocytes Relative 35 %   Lymphs Abs 3.1 0.7 - 4.0 K/uL   Monocytes Relative 7 %   Monocytes Absolute 0.7 0.1 - 1.0 K/uL   Eosinophils Relative 0 %   Eosinophils Absolute 0.0 0.0 - 0.7 K/uL   Basophils Relative 0 %   Basophils Absolute 0.0 0.0 - 0.1 K/uL  Basic metabolic panel     Status: Abnormal    Collection Time: 07/13/17  4:18 AM  Result Value Ref Range   Sodium 143 135 - 145 mmol/L   Potassium 4.1  3.5 - 5.1 mmol/L   Chloride 105 101 - 111 mmol/L   CO2 28 22 - 32 mmol/L   Glucose, Bld 132 (H) 65 - 99 mg/dL   BUN 15 6 - 20 mg/dL   Creatinine, Ser 0.79 0.44 - 1.00 mg/dL   Calcium 8.6 (L) 8.9 - 10.3 mg/dL   GFR calc non Af Amer >60 >60 mL/min   GFR calc Af Amer >60 >60 mL/min   Anion gap 10 5 - 15  Glucose, capillary     Status: Abnormal   Collection Time: 07/13/17  7:26 AM  Result Value Ref Range   Glucose-Capillary 111 (H) 65 - 99 mg/dL    Physical Exam: General: Alert and awake oriented x3 not in any acute distress. HEENT: anicteric sclera, pupils reactive to light and accommodation CVS: S1-S2 clear no murmur rubs or gallops Chest: clear to auscultation bilaterally, no wheezing rales or rhonchi Abdomen: soft nontender, nondistended, normal bowel sounds, no organomegaly Extremities: no cyanosis, clubbing or edema noted bilaterally Neuro: Cranial nerves II-XII intact, no focal neurological deficits  Disposition:  SNF  Diet:  Regulr    DISCHARGE FOLLOW-UP Contact information for after-discharge care    Destination    [facility] .   Service:  Skilled Nursing Contact information: [place] [phone]                Signed: [place] 07/13/2017, 11:14 AM

## 2017-07-20 ENCOUNTER — Other Ambulatory Visit: Payer: Self-pay | Admitting: Urology

## 2017-07-26 NOTE — Progress Notes (Signed)
After two attempts was ablle to speak with nurse, Caryl Pina, at [facility].  Nurse reported there have been no medication changes since 07/13/2017 and no new medical history to report.  Informed nurse that preop instructions for surgery on 07/29/2017 would be faxed over today.

## 2017-07-26 NOTE — Progress Notes (Signed)
Preop instructions for:  [name]                         Date of Birth : 01/01/1956                           Date of Procedure: 07/29/2017       Doctor: Dr Franchot Gallo  Time to arrive at [facility]: [id number] Report to: Admitting  Procedure:cystoscopy, /ureteroscopy, stone basketry and stent placement, bilateral holmium laser application, bilateral cystoscopy with stent removal  Any procedure time changes, MD office will notify you! PT TO [facility] [name]   Do not eat or drink past midnight the night before your procedure.(To include any tube feedings-must be discontinued)    Take these morning medications only with sips of water.(or give through gastrostomy or feeding tube). Cymbalta, Depakote, Keppra, LEvothyroxine, Norvasc ( Amlodipine), Omeprazole, Labetalol, Oxycodone if needed, Clonidine if needed, Lidoderm patch  Note: No Insulin or Diabetic meds should be given or taken the morning of the procedure!   Facility contact: Alphonsus Sias                  Phone: [phone]                    [place] - spouse phone- [phone]  Transportation contact phone#: Facility to provide transportation  Please send day of procedure:current med list and meds last taken that day, confirm nothing by mouth status from what time, Patient Demographic info( to include DNR status, problem list, allergies)                                  Bring Insurance card and picture ID Leave all jewelry and other valuables at place where living( no metal or rings to be worn) No contact lens Women-no make-up, no lotions,perfumes,powders   Any questions day of procedure,call: Short Stay -[phone]     Sent from :[facility]                   [place]                   [phone]  Sent by :Gillian Shields RN

## 2017-07-28 MED ORDER — GENTAMICIN SULFATE 40 MG/ML IJ SOLN
5.0000 mg/kg | INTRAVENOUS | Status: AC
  Filled 2017-07-28: qty 9.75

## 2017-07-29 ENCOUNTER — Ambulatory Visit (HOSPITAL_COMMUNITY): Admission: RE | Admit: 2017-07-29 | Payer: Medicare Other | Source: Ambulatory Visit | Admitting: Urology

## 2017-07-29 ENCOUNTER — Other Ambulatory Visit: Payer: Self-pay | Admitting: Urology

## 2017-07-29 SURGERY — CYSTOSCOPY, WITH CALCULUS MANIPULATION OR REMOVAL
Anesthesia: General | Laterality: Bilateral

## 2017-08-25 NOTE — Progress Notes (Addendum)
Preop instructions for:  [name]                        Date of Birth   January 21, 1956                          Date of Procedure:  : 09/02/2017     Doctor :Dr Franchot Gallo  Time to arrive at [facility]: 0930 Report to: Admitting  Procedure:cystoscopy, bilateral J2 stent extraction, bilateral ureteroscopy, holmium laser lithotripsy, extraction of stones, bilateral J 2 stent placements  Any procedure time changes, MD office will notify you!   Do not eat or drink past midnight the night before your procedure.(To include any tube feedings-must be discontinued)     Take these morning medications only with sips of water.(or give through gastrostomy or feeding tube). Cymbalta, Depakote, Keppra, Levothyroxine ( Synthroid), Norvasc( amlodipine), Omeprazole ( Prilosec), Labetalol, Tylenol  if needed, Clonidine if needed, Lidoderm patch  Systane eye drops as usual     Facility contact:  [employer]:  [place]: [place]- [phone]   Transportation contact phone#: Facillity to provide transportation  Please send day of procedure:current med list and meds last taken that day, confirm nothing by mouth status from what time, Patient Demographic info( to include DNR status, problem list, allergies)   RN contact name/phone#:  [facility] [phone]                           and Fax [phone] card and picture ID Leave all jewelry and other valuables at place where living( no metal or rings to be worn) No contact lens Women-no make-up, no lotions,perfumes,powders   Any questions day of procedure,call Short Stay [phone]    Sent from :[facility]                   [place]                   Fax:[phone]  Sent by :Gillian Shields RN

## 2017-08-26 ENCOUNTER — Other Ambulatory Visit: Payer: Self-pay

## 2017-08-26 ENCOUNTER — Encounter (HOSPITAL_COMMUNITY): Payer: Self-pay | Admitting: *Deleted

## 2017-09-01 ENCOUNTER — Other Ambulatory Visit: Payer: Self-pay | Admitting: Urology

## 2017-09-01 NOTE — H&P (Signed)
H&P  Chief Complaint: Bilateral kidney stones  History of Present Illness: [name] is a 62 y.o. year old female Who presents at this time for repeat bilateral ureteroscopic management of bilateral renal calculi.  She has recurrent urinary tract infections, and has a history of urgent stenting of an infected, obstructed ureteral stone earlier this year.  She did have ureteroscopic/holmium laser management of a right renal stone, but has some burden left in the right kidney as well as left renal calculi.  She has bilateral stents and presents at this time for ureteroscopy.  She has completed a recent course of Invanz at the nursing home facility where she resides.  Past Medical History:  Diagnosis Date  . Acute kidney failure (HCC)    hx of   . Acute respiratory failure ([place]) 07/27/2012  . Arthritis    OSTEO LEFT LEG  . Bilateral cataracts   . Candidiasis of skin and nail   . Chronic pain   . Chronic pain syndrome   . Constipation   . Contractures involving both knees    and ankles   . CVA (cerebral vascular accident) ([place])    left sided hemiparesis  . Depression   . Diabetes mellitus without complication (HCC)    type II, DIET CONTROLLED  . Dry eye syndrome of bilateral lacrimal glands   . Dysphagia   . Feeding difficulties   . 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 GERD (gastroesophageal reflux disease)   . Hemiparesis affecting left side as late effect of cerebrovascular accident ([place])   . History of kidney stones   . Hyperlipidemia   . Hypertension   . Metabolic encephalopathy 53/61/4431  . Morbid obesity ([place])   . Muscle weakness (generalized)   . Narcotic overdose ([place]) 07/27/2012  . Parkinson's disease ([place])   . Peripheral vascular disease ([place])   . Polymyalgia rheumatica ([place])   . Primary insomnia   . Respiratory failure ([place]) 05/06/10  . Seizure disorder ([place])   . Sepsis ([place])    hx of   . Sleep apnea 04/2010   on CPAP, "severe central sleep apnea"  . Spasticity    chronic  . Thrombocytopenia ([place])    related to depakote  . Thrombocytopenia ([place])   . Tinea unguium   . Unspecified hypothyroidism   . 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[place])   . Urinary incontinence   . UTI (lower urinary tract infection) 05/06/10    Past Surgical History:  Procedure Laterality Date  . CEREBRAL ANEURYSM REPAIR  1999  . CYSTOSCOPY W/ URETERAL STENT PLACEMENT Bilateral 04/22/2017   Procedure: CYSTOSCOPY WITH BILATERAL  RETROGRADE PYELOGRAM, RIGHT URETERAL STENT PLACEMENT;  Surgeon: Festus Aloe, MD;  Location: [facility];  Service: Urology;  Laterality: Bilateral;  . CYSTOSCOPY W/ URETERAL STENT REMOVAL Right 05/17/2017   Procedure: CYSTOSCOPY WITH STENT REMOVAL;  Surgeon: Franchot Gallo, MD;  Location: [facility];  Service: Urology;  Laterality: Right;  . CYSTOSCOPY W/ URETERAL STENT REMOVAL Bilateral 07/12/2017   Procedure: CYSTOSCOPY WITH STENT EXTRACTION;  Surgeon: Franchot Gallo, MD;  Location: [facility];  Service: Urology;  Laterality: Bilateral;  . CYSTOSCOPY/URETEROSCOPY/HOLMIUM LASER/STENT PLACEMENT Right 05/17/2017   Procedure: CYSTOSCOPY/URETEROSCOPY/HOLMIUM LASER/STENT PLACEMENT/BILATERAL STENT PLACEMENTS ,RIGHT STONE EXTRACTION;  Surgeon: Franchot Gallo, MD;  Location: [facility];  Service: Urology;  Laterality: Right;  . CYSTOSCOPY/URETEROSCOPY/HOLMIUM LASER/STENT PLACEMENT Bilateral 07/12/2017   Procedure: CYSTOSCOPY/DIAGNOSTIC BILATERAL URETEROSCOPY/BILATERAL STENT PLACEMENT;  Surgeon: Franchot Gallo, MD;  Location:  [facility];  Service: Urology;  Laterality: Bilateral;  . INTRATHECAL PUMP IMPLANTATION  2010   [employer]:  fentanyl, baclofen changed to morphine and baclofen on 07/2012  . PICC LINE INSERTION     RIGHT ARM    Home Medications:  No medications prior to admission.    Allergies:  Allergies  Allergen Reactions  . Codeine Other (See Comments)    unknown  . Lacosamide  Other (See Comments)    Mood changes/agitation  . Morphine Other (See Comments)    No rx noted    . Zolpidem Other (See Comments)    Per MAR    History reviewed. No pertinent family history.  Social History:  reports that she has never smoked. She has never used smokeless tobacco. She reports that she does not drink alcohol or use drugs.  ROS: A complete review of systems was performed.  All systems are negative except for pertinent findings as noted.  Physical Exam:  Vital signs in last 24 hours:   General:  Somewhat lethargic, normal for her HEENT: Normocephalic, atraumatic Neck: No JVD or lymphadenopathy Cardiovascular: Regular rate  Lungs: normal respiratory rate Abdomen: Soft, obese, nontender Back: No CVA tenderness Extremities: No edema Neurologic: consistent withhemiparesis from CVA  Laboratory Data:  No results found for this or any previous visit (from the past 24 hour(s)). No results found for this or any previous visit (from the past 240 hour(s)). Creatinine: No results for input(s): CREATININE in the last 168 hours.  Radiologic Imaging: No results found.  Impression/Assessment:  Bilateral renal calculi cystoscopy, bilateral stent removal  Plan:  , Bilateral ureteroscopy, holmium laser and extraction of bilateral renal calculi, stent replacement.  [name] 09/01/2017, 5:39 PM  [name]. Rita Rossell MD

## 2017-09-01 NOTE — Progress Notes (Signed)
Called and obtained phone consent from husband, Marisabel Macpherson, for the following:  The same procedjres as listed on the consent signed 07/26/2017 for cystoscopy, blateral J2 stent extraction, bilateral ureteroscopy, , holm,ium laser lithotripsy,, extractions of stones, bilateral J2 stent placements.  Read the 07/26/2017 consent to husband and he gave permission for the above procedure.  Consent signed by 2 RNS both Gillian Shields RN and Dorthea Cove for the above list procedure.

## 2017-09-02 ENCOUNTER — Encounter (HOSPITAL_COMMUNITY)
Admission: AD | Disposition: A | Discharge status: Skilled Nursing Facility | Payer: Self-pay | Source: Ambulatory Visit | Attending: Urology

## 2017-09-02 ENCOUNTER — Ambulatory Visit (HOSPITAL_COMMUNITY): Payer: Medicare Other | Admitting: Certified Registered Nurse Anesthetist

## 2017-09-02 ENCOUNTER — Other Ambulatory Visit: Payer: Self-pay

## 2017-09-02 ENCOUNTER — Inpatient Hospital Stay (HOSPITAL_COMMUNITY)
Admission: AD | Admit: 2017-09-02 | Discharge: 2017-09-05 | DRG: 660 | Disposition: A | Discharge status: Skilled Nursing Facility | Payer: Medicare Other | Source: Ambulatory Visit | Attending: Urology | Admitting: Urology

## 2017-09-02 ENCOUNTER — Ambulatory Visit (HOSPITAL_COMMUNITY): Payer: Medicare Other

## 2017-09-02 ENCOUNTER — Encounter (HOSPITAL_COMMUNITY): Payer: Self-pay | Admitting: Emergency Medicine

## 2017-09-02 DIAGNOSIS — I10 Essential (primary) hypertension: Secondary | ICD-10-CM | POA: Diagnosis present

## 2017-09-02 DIAGNOSIS — K219 Gastro-esophageal reflux disease without esophagitis: Secondary | ICD-10-CM | POA: Diagnosis present

## 2017-09-02 DIAGNOSIS — G40909 Epilepsy, unspecified, not intractable, without status epilepticus: Secondary | ICD-10-CM | POA: Diagnosis present

## 2017-09-02 DIAGNOSIS — Z79899 Other long term (current) drug therapy: Secondary | ICD-10-CM

## 2017-09-02 DIAGNOSIS — N202 Calculus of kidney with calculus of ureter: Principal | ICD-10-CM | POA: Diagnosis present

## 2017-09-02 DIAGNOSIS — Z7982 Long term (current) use of aspirin: Secondary | ICD-10-CM

## 2017-09-02 DIAGNOSIS — F5101 Primary insomnia: Secondary | ICD-10-CM | POA: Diagnosis present

## 2017-09-02 DIAGNOSIS — G4731 Primary central sleep apnea: Secondary | ICD-10-CM | POA: Diagnosis present

## 2017-09-02 DIAGNOSIS — E039 Hypothyroidism, unspecified: Secondary | ICD-10-CM | POA: Diagnosis present

## 2017-09-02 DIAGNOSIS — F329 Major depressive disorder, single episode, unspecified: Secondary | ICD-10-CM | POA: Diagnosis present

## 2017-09-02 DIAGNOSIS — A419 Sepsis, unspecified organism: Secondary | ICD-10-CM

## 2017-09-02 DIAGNOSIS — M353 Polymyalgia rheumatica: Secondary | ICD-10-CM | POA: Diagnosis present

## 2017-09-02 DIAGNOSIS — I69354 Hemiplegia and hemiparesis following cerebral infarction affecting left non-dominant side: Secondary | ICD-10-CM

## 2017-09-02 DIAGNOSIS — Z6841 Body Mass Index (BMI) 40.0 and over, adult: Secondary | ICD-10-CM

## 2017-09-02 DIAGNOSIS — L8990 Pressure ulcer of unspecified site, unspecified stage: Secondary | ICD-10-CM

## 2017-09-02 DIAGNOSIS — Z885 Allergy status to narcotic agent status: Secondary | ICD-10-CM

## 2017-09-02 DIAGNOSIS — Z888 Allergy status to other drugs, medicaments and biological substances status: Secondary | ICD-10-CM

## 2017-09-02 DIAGNOSIS — G20 Parkinson's disease: Secondary | ICD-10-CM | POA: Diagnosis present

## 2017-09-02 DIAGNOSIS — Z9989 Dependence on other enabling machines and devices: Secondary | ICD-10-CM

## 2017-09-02 DIAGNOSIS — E1136 Type 2 diabetes mellitus with diabetic cataract: Secondary | ICD-10-CM | POA: Diagnosis present

## 2017-09-02 DIAGNOSIS — N200 Calculus of kidney: Secondary | ICD-10-CM

## 2017-09-02 DIAGNOSIS — Z87442 Personal history of urinary calculi: Secondary | ICD-10-CM

## 2017-09-02 DIAGNOSIS — L89152 Pressure ulcer of sacral region, stage 2: Secondary | ICD-10-CM | POA: Diagnosis present

## 2017-09-02 DIAGNOSIS — E1151 Type 2 diabetes mellitus with diabetic peripheral angiopathy without gangrene: Secondary | ICD-10-CM | POA: Diagnosis present

## 2017-09-02 DIAGNOSIS — E785 Hyperlipidemia, unspecified: Secondary | ICD-10-CM | POA: Diagnosis present

## 2017-09-02 DIAGNOSIS — Z87440 Personal history of urinary (tract) infections: Secondary | ICD-10-CM

## 2017-09-02 DIAGNOSIS — G894 Chronic pain syndrome: Secondary | ICD-10-CM | POA: Diagnosis present

## 2017-09-02 HISTORY — PX: HOLMIUM LASER APPLICATION: SHX5852

## 2017-09-02 HISTORY — PX: CYSTOSCOPY/URETEROSCOPY/HOLMIUM LASER/STENT PLACEMENT: SHX6546

## 2017-09-02 LAB — CBC
HEMATOCRIT: 33.4 % — AB (ref 36.0–46.0)
HEMATOCRIT: 39.4 % (ref 36.0–46.0)
HEMOGLOBIN: 10 g/dL — AB (ref 12.0–15.0)
HEMOGLOBIN: 11.7 g/dL — AB (ref 12.0–15.0)
MCH: 28.7 pg (ref 26.0–34.0)
MCH: 28.5 pg (ref 26.0–34.0)
MCHC: 29.9 g/dL — ABNORMAL LOW (ref 30.0–36.0)
MCHC: 29.7 g/dL — ABNORMAL LOW (ref 30.0–36.0)
MCV: 96 fL (ref 78.0–100.0)
MCV: 95.9 fL (ref 78.0–100.0)
Platelets: 209 10*3/uL (ref 150–400)
Platelets: 120 10*3/uL — ABNORMAL LOW (ref 150–400)
RBC: 3.48 MIL/uL — ABNORMAL LOW (ref 3.87–5.11)
RBC: 4.11 MIL/uL (ref 3.87–5.11)
RDW: 15 % (ref 11.5–15.5)
RDW: 15.2 % (ref 11.5–15.5)
WBC: 7 10*3/uL (ref 4.0–10.5)
WBC: 3.3 10*3/uL — ABNORMAL LOW (ref 4.0–10.5)

## 2017-09-02 LAB — BASIC METABOLIC PANEL
ANION GAP: 9 (ref 5–15)
BUN: 12 mg/dL (ref 8–23)
CHLORIDE: 102 mmol/L (ref 98–111)
CO2: 33 mmol/L — AB (ref 22–32)
Calcium: 8.2 mg/dL — ABNORMAL LOW (ref 8.9–10.3)
Creatinine, Ser: 0.69 mg/dL (ref 0.44–1.00)
GLUCOSE: 103 mg/dL — AB (ref 70–99)
Potassium: 3 mmol/L — ABNORMAL LOW (ref 3.5–5.1)
Sodium: 144 mmol/L (ref 135–145)

## 2017-09-02 LAB — CREATININE, SERUM
Creatinine, Ser: 0.74 mg/dL (ref 0.44–1.00)
GFR calc Af Amer: >60 mL/min (ref >60)
GFR calc non Af Amer: >60 mL/min (ref >60)

## 2017-09-02 LAB — GLUCOSE, CAPILLARY
GLUCOSE-CAPILLARY: 128 mg/dL — AB (ref 70–99)
Glucose-Capillary: 90 mg/dL (ref 70–99)
Glucose-Capillary: 106 mg/dL — ABNORMAL HIGH (ref 70–99)

## 2017-09-02 SURGERY — CYSTOSCOPY/URETEROSCOPY/HOLMIUM LASER/STENT PLACEMENT
Anesthesia: General | Laterality: Left

## 2017-09-02 MED ORDER — GENTAMICIN SULFATE 40 MG/ML IJ SOLN
400.0000 mg | INTRAVENOUS | Status: AC
  Administered 2017-09-02: 400 mg via INTRAVENOUS
  Filled 2017-09-02: qty 10

## 2017-09-02 MED ORDER — FENTANYL CITRATE (PF) 100 MCG/2ML IJ SOLN
INTRAMUSCULAR | Status: AC
  Filled 2017-09-02: qty 2

## 2017-09-02 MED ORDER — ROSUVASTATIN CALCIUM 5 MG PO TABS
5.0000 mg | ORAL_TABLET | Freq: Every day | ORAL | Status: DC
  Administered 2017-09-02 – 2017-09-04 (×3): 5 mg via ORAL
  Filled 2017-09-02 (×3): qty 1

## 2017-09-02 MED ORDER — PROPOFOL 10 MG/ML IV BOLUS
INTRAVENOUS | Status: DC | PRN
  Administered 2017-09-02: 200 mg via INTRAVENOUS

## 2017-09-02 MED ORDER — LEVOTHYROXINE SODIUM 100 MCG PO TABS
100.0000 ug | ORAL_TABLET | Freq: Every day | ORAL | Status: DC
  Administered 2017-09-03 – 2017-09-05 (×3): 100 ug via ORAL
  Filled 2017-09-02 (×3): qty 1

## 2017-09-02 MED ORDER — LEVETIRACETAM 500 MG PO TABS: 1000.0000 mg | ORAL_TABLET | ORAL | Status: DC

## 2017-09-02 MED ORDER — SODIUM CHLORIDE 0.9 % IR SOLN
Status: DC | PRN
  Administered 2017-09-02: 6000 mL via INTRAVESICAL

## 2017-09-02 MED ORDER — DULOXETINE HCL 60 MG PO CPEP
80.0000 mg | ORAL_CAPSULE | Freq: Every day | ORAL | Status: DC
  Administered 2017-09-03 – 2017-09-05 (×3): 80 mg via ORAL
  Filled 2017-09-02 (×3): qty 1

## 2017-09-02 MED ORDER — ASPIRIN EC 81 MG PO TBEC
81.0000 mg | DELAYED_RELEASE_TABLET | Freq: Every day | ORAL | Status: DC
  Administered 2017-09-02 – 2017-09-05 (×4): 81 mg via ORAL
  Filled 2017-09-02 (×4): qty 1

## 2017-09-02 MED ORDER — DIVALPROEX SODIUM 250 MG PO DR TAB
500.0000 mg | DELAYED_RELEASE_TABLET | Freq: Two times a day (BID) | ORAL | Status: DC
  Administered 2017-09-02 – 2017-09-05 (×6): 500 mg via ORAL
  Filled 2017-09-02 (×6): qty 2

## 2017-09-02 MED ORDER — ONDANSETRON HCL 4 MG/2ML IJ SOLN: 4.0000 mg | Freq: Once | INTRAMUSCULAR | Status: DC | PRN

## 2017-09-02 MED ORDER — PROPOFOL 10 MG/ML IV BOLUS
INTRAVENOUS | Status: AC
  Filled 2017-09-02: qty 20

## 2017-09-02 MED ORDER — ROCURONIUM BROMIDE 50 MG/5ML IV SOSY
PREFILLED_SYRINGE | INTRAVENOUS | Status: DC | PRN
  Administered 2017-09-02: 20 mg via INTRAVENOUS

## 2017-09-02 MED ORDER — LOSARTAN POTASSIUM 50 MG PO TABS
50.0000 mg | ORAL_TABLET | Freq: Every day | ORAL | Status: DC
  Administered 2017-09-02 – 2017-09-04 (×3): 50 mg via ORAL
  Filled 2017-09-02 (×3): qty 1

## 2017-09-02 MED ORDER — LACTULOSE 10 GM/15ML PO SOLN
20.0000 g | Freq: Every day | ORAL | Status: DC
  Administered 2017-09-02 – 2017-09-05 (×3): 20 g via ORAL
  Filled 2017-09-02 (×4): qty 30

## 2017-09-02 MED ORDER — ENOXAPARIN SODIUM 40 MG/0.4ML ~~LOC~~ SOLN
40.0000 mg | SUBCUTANEOUS | Status: DC
  Administered 2017-09-03 – 2017-09-05 (×3): 40 mg via SUBCUTANEOUS
  Filled 2017-09-02 (×3): qty 0.4

## 2017-09-02 MED ORDER — POLYETHYLENE GLYCOL 3350 17 G PO PACK
17.0000 g | PACK | Freq: Every day | ORAL | Status: DC
  Administered 2017-09-02 – 2017-09-05 (×3): 17 g via ORAL
  Filled 2017-09-02 (×3): qty 1

## 2017-09-02 MED ORDER — ACETAMINOPHEN 325 MG PO TABS
650.0000 mg | ORAL_TABLET | ORAL | Status: DC | PRN
  Administered 2017-09-02 – 2017-09-03 (×3): 650 mg via ORAL
  Filled 2017-09-02 (×3): qty 2

## 2017-09-02 MED ORDER — ORAL CARE MOUTH RINSE
15.0000 mL | Freq: Two times a day (BID) | OROMUCOSAL | Status: DC
  Administered 2017-09-02 – 2017-09-03 (×3): 15 mL via OROMUCOSAL

## 2017-09-02 MED ORDER — PANTOPRAZOLE SODIUM 40 MG PO TBEC
40.0000 mg | DELAYED_RELEASE_TABLET | Freq: Every day | ORAL | Status: DC
  Administered 2017-09-03 – 2017-09-05 (×3): 40 mg via ORAL
  Filled 2017-09-02 (×3): qty 1

## 2017-09-02 MED ORDER — TRAZODONE HCL 50 MG PO TABS
50.0000 mg | ORAL_TABLET | Freq: Every day | ORAL | Status: DC
  Administered 2017-09-02 – 2017-09-04 (×3): 50 mg via ORAL
  Filled 2017-09-02 (×3): qty 1

## 2017-09-02 MED ORDER — DEXAMETHASONE SODIUM PHOSPHATE 10 MG/ML IJ SOLN
INTRAMUSCULAR | Status: DC | PRN
  Administered 2017-09-02: 10 mg via INTRAVENOUS

## 2017-09-02 MED ORDER — LACTATED RINGERS IV SOLN
INTRAVENOUS | Status: DC
  Administered 2017-09-02: 11:00:00 via INTRAVENOUS

## 2017-09-02 MED ORDER — SODIUM CHLORIDE 0.45 % IV SOLN
INTRAVENOUS | Status: DC
  Administered 2017-09-02 – 2017-09-04 (×2): via INTRAVENOUS
  Administered 2017-09-05: 50 mL/h via INTRAVENOUS

## 2017-09-02 MED ORDER — LIDOCAINE 2% (20 MG/ML) 5 ML SYRINGE
INTRAMUSCULAR | Status: DC | PRN
  Administered 2017-09-02: 100 mg via INTRAVENOUS

## 2017-09-02 MED ORDER — LORATADINE 10 MG PO TABS
10.0000 mg | ORAL_TABLET | Freq: Every day | ORAL | Status: DC
  Administered 2017-09-02 – 2017-09-05 (×4): 10 mg via ORAL
  Filled 2017-09-02 (×4): qty 1

## 2017-09-02 MED ORDER — ONDANSETRON HCL 4 MG/2ML IJ SOLN
INTRAMUSCULAR | Status: AC
  Filled 2017-09-02: qty 2

## 2017-09-02 MED ORDER — OXYCODONE HCL 5 MG PO TABS
5.0000 mg | ORAL_TABLET | Freq: Four times a day (QID) | ORAL | Status: DC | PRN
  Administered 2017-09-02 – 2017-09-05 (×7): 5 mg via ORAL
  Filled 2017-09-02 (×7): qty 1

## 2017-09-02 MED ORDER — POLYVINYL ALCOHOL 1.4 % OP SOLN: 1.0000 [drp] | Freq: Three times a day (TID) | OPHTHALMIC | Status: DC | PRN

## 2017-09-02 MED ORDER — FLUCONAZOLE 100 MG PO TABS
100.0000 mg | ORAL_TABLET | Freq: Every day | ORAL | Status: DC
  Administered 2017-09-02 – 2017-09-05 (×4): 100 mg via ORAL
  Filled 2017-09-02 (×4): qty 1

## 2017-09-02 MED ORDER — DIVALPROEX SODIUM 125 MG PO DR TAB
125.0000 mg | DELAYED_RELEASE_TABLET | Freq: Two times a day (BID) | ORAL | Status: DC
  Administered 2017-09-02 – 2017-09-05 (×6): 125 mg via ORAL
  Filled 2017-09-02 (×6): qty 1

## 2017-09-02 MED ORDER — STERILE WATER FOR IRRIGATION IR SOLN
Status: DC | PRN
  Administered 2017-09-02: 500 mL

## 2017-09-02 MED ORDER — SODIUM CHLORIDE 0.9 % IV SOLN
1.0000 g | Freq: Three times a day (TID) | INTRAVENOUS | Status: DC
  Administered 2017-09-02 – 2017-09-05 (×8): 1 g via INTRAVENOUS
  Filled 2017-09-02 (×10): qty 1

## 2017-09-02 MED ORDER — LIDOCAINE 2% (20 MG/ML) 5 ML SYRINGE
INTRAMUSCULAR | Status: AC
Start: 2017-09-02 — End: ?
  Filled 2017-09-02: qty 5

## 2017-09-02 MED ORDER — SODIUM CHLORIDE 0.9% FLUSH: 10.0000 mL | INTRAVENOUS | Status: DC | PRN

## 2017-09-02 MED ORDER — FUROSEMIDE 20 MG PO TABS
20.0000 mg | ORAL_TABLET | Freq: Two times a day (BID) | ORAL | Status: DC
  Administered 2017-09-02 – 2017-09-05 (×6): 20 mg via ORAL
  Filled 2017-09-02 (×6): qty 1

## 2017-09-02 MED ORDER — IOHEXOL 300 MG/ML  SOLN
INTRAMUSCULAR | Status: DC | PRN
  Administered 2017-09-02: 5 mL via URETHRAL

## 2017-09-02 MED ORDER — FENTANYL CITRATE (PF) 100 MCG/2ML IJ SOLN: 25.0000 ug | INTRAMUSCULAR | Status: DC | PRN

## 2017-09-02 MED ORDER — SUGAMMADEX SODIUM 200 MG/2ML IV SOLN
INTRAVENOUS | Status: DC | PRN
  Administered 2017-09-02: 200 mg via INTRAVENOUS

## 2017-09-02 MED ORDER — OLOPATADINE HCL 0.1 % OP SOLN
1.0000 [drp] | Freq: Two times a day (BID) | OPHTHALMIC | Status: DC
  Administered 2017-09-02 – 2017-09-05 (×6): 1 [drp] via OPHTHALMIC
  Filled 2017-09-02: qty 5

## 2017-09-02 MED ORDER — DEXAMETHASONE SODIUM PHOSPHATE 10 MG/ML IJ SOLN
INTRAMUSCULAR | Status: AC
Start: 2017-09-02 — End: ?
  Filled 2017-09-02: qty 1

## 2017-09-02 MED ORDER — AMLODIPINE BESYLATE 10 MG PO TABS
10.0000 mg | ORAL_TABLET | Freq: Every day | ORAL | Status: DC
  Administered 2017-09-04 – 2017-09-05 (×2): 10 mg via ORAL
  Filled 2017-09-02 (×3): qty 1

## 2017-09-02 MED ORDER — CLONIDINE HCL 0.1 MG PO TABS: 0.1000 mg | ORAL_TABLET | Freq: Four times a day (QID) | ORAL | Status: DC | PRN

## 2017-09-02 MED ORDER — HEPARIN SOD (PORK) LOCK FLUSH 10 UNIT/ML IV SOLN
50.0000 [IU] | Freq: Three times a day (TID) | INTRAVENOUS | Status: DC
  Filled 2017-09-02: qty 5

## 2017-09-02 MED ORDER — DULOXETINE HCL 60 MG PO CPEP: 60.0000 mg | ORAL_CAPSULE | Freq: Every day | ORAL | Status: DC

## 2017-09-02 MED ORDER — LEVETIRACETAM 500 MG PO TABS
1500.0000 mg | ORAL_TABLET | Freq: Every day | ORAL | Status: DC
  Administered 2017-09-02 – 2017-09-04 (×3): 1500 mg via ORAL
  Filled 2017-09-02 (×3): qty 3

## 2017-09-02 MED ORDER — LEVETIRACETAM 500 MG PO TABS
1000.0000 mg | ORAL_TABLET | Freq: Every day | ORAL | Status: DC
  Administered 2017-09-03 – 2017-09-05 (×3): 1000 mg via ORAL
  Filled 2017-09-02 (×3): qty 2

## 2017-09-02 MED ORDER — ONDANSETRON HCL 4 MG/2ML IJ SOLN
INTRAMUSCULAR | Status: DC | PRN
Start: 2017-09-02 — End: 2017-09-02
  Administered 2017-09-02: 4 mg via INTRAVENOUS

## 2017-09-02 MED ORDER — DULOXETINE HCL 20 MG PO CPEP: 20.0000 mg | ORAL_CAPSULE | Freq: Every day | ORAL | Status: DC

## 2017-09-02 MED ORDER — LABETALOL HCL 200 MG PO TABS
200.0000 mg | ORAL_TABLET | Freq: Two times a day (BID) | ORAL | Status: DC
  Administered 2017-09-02 – 2017-09-05 (×5): 200 mg via ORAL
  Filled 2017-09-02 (×6): qty 1

## 2017-09-02 MED ORDER — FENTANYL CITRATE (PF) 100 MCG/2ML IJ SOLN
INTRAMUSCULAR | Status: DC | PRN
  Administered 2017-09-02 (×3): 50 ug via INTRAVENOUS
  Administered 2017-09-02 (×2): 25 ug via INTRAVENOUS

## 2017-09-02 MED ORDER — ALBUTEROL SULFATE HFA 108 (90 BASE) MCG/ACT IN AERS
INHALATION_SPRAY | RESPIRATORY_TRACT | Status: DC | PRN
  Administered 2017-09-02: 2 via RESPIRATORY_TRACT

## 2017-09-02 MED ORDER — MIDAZOLAM HCL 2 MG/2ML IJ SOLN
INTRAMUSCULAR | Status: AC
  Filled 2017-09-02: qty 2

## 2017-09-02 SURGICAL SUPPLY — 31 items
BAG URINE DRAINAGE (UROLOGICAL SUPPLIES) ×2 IMPLANT
BAG URO CATCHER STRL LF (MISCELLANEOUS) ×4 IMPLANT
BASKET LASER NITINOL 1.9FR (BASKET) ×2 IMPLANT
BASKET ZERO TIP NITINOL 2.4FR (BASKET) IMPLANT
BSKT STON RTRVL 120 1.9FR (BASKET)
BSKT STON RTRVL ZERO TP 2.4FR (BASKET)
CATH FOLEY 2WAY SLVR  5CC 16FR (CATHETERS) ×2
CATH FOLEY 2WAY SLVR 5CC 16FR (CATHETERS) IMPLANT
CATH INTERMIT  6FR 70CM (CATHETERS) ×2 IMPLANT
CLOTH BEACON ORANGE TIMEOUT ST (SAFETY) ×4 IMPLANT
COVER FOOTSWITCH UNIV (MISCELLANEOUS) ×2 IMPLANT
COVER SURGICAL LIGHT HANDLE (MISCELLANEOUS) ×2 IMPLANT
EXTRACTOR STONE NITINOL NGAGE (UROLOGICAL SUPPLIES) ×4 IMPLANT
FIBER LASER FLEXIVA 365 (UROLOGICAL SUPPLIES) IMPLANT
FIBER LASER TRAC TIP (UROLOGICAL SUPPLIES) IMPLANT
GLOVE BIOGEL M 8.0 STRL (GLOVE) ×4 IMPLANT
GOWN STRL REUS W/ TWL XL LVL3 (GOWN DISPOSABLE) ×2 IMPLANT
GOWN STRL REUS W/TWL LRG LVL3 (GOWN DISPOSABLE) ×6 IMPLANT
GOWN STRL REUS W/TWL XL LVL3 (GOWN DISPOSABLE)
GUIDEWIRE ANG ZIPWIRE 038X150 (WIRE) ×2 IMPLANT
GUIDEWIRE STR DUAL SENSOR (WIRE) ×4 IMPLANT
HOLDER FOLEY CATH W/STRAP (MISCELLANEOUS) ×2 IMPLANT
IV NS 1000ML (IV SOLUTION)
IV NS 1000ML BAXH (IV SOLUTION) ×2 IMPLANT
MANIFOLD NEPTUNE II (INSTRUMENTS) ×4 IMPLANT
PACK CYSTO (CUSTOM PROCEDURE TRAY) ×4 IMPLANT
SHEATH URETERAL 12FRX28CM (UROLOGICAL SUPPLIES) ×2 IMPLANT
STENT URET 6FRX24 CONTOUR (STENTS) ×4 IMPLANT
TUBING CONNECTING 10 (TUBING) ×3 IMPLANT
TUBING CONNECTING 10' (TUBING) ×1
TUBING UROLOGY SET (TUBING) ×4 IMPLANT

## 2017-09-02 NOTE — Anesthesia Postprocedure Evaluation (Signed)
Anesthesia Post Note  Patient: [name]  Procedure(s) Performed: CYSTOSCOPY/ URETEROSCOPY/ RETROGRADE PYELOGRAM/ STENT EXCHANGE/ STONE EXTRACTION (Bilateral ) HOLMIUM LASER APPLICATION (Left )     Patient location during evaluation: PACU Anesthesia Type: General Level of consciousness: sedated Pain management: pain level controlled Vital Signs Assessment: post-procedure vital signs reviewed and stable Respiratory status: spontaneous breathing Cardiovascular status: stable Postop Assessment: no apparent nausea or vomiting Anesthetic complications: no    Last Vitals:  Vitals:   09/02/17 2136 09/02/17 2325  BP: 96/60 (!) 103/51  Pulse: 100 89  Resp: 20   Temp: (!) 38.7 C 37.1 C  SpO2: 97%     Last Pain:  Vitals:   09/02/17 2325  TempSrc: Oral  PainSc:    Pain Goal: Patients Stated Pain Goal: 3 (09/02/17 1835)               Jeovany Huitron JR,JOHN Mateo Flow

## 2017-09-02 NOTE — Anesthesia Preprocedure Evaluation (Addendum)
Anesthesia Evaluation  Patient identified by MRN, date of birth, ID band Patient awake    Reviewed: Allergy & Precautions, NPO status , Patient's Chart, lab work & pertinent test results  Airway Mallampati: III  TM Distance: >3 FB Neck ROM: Full    Dental  (+) Poor Dentition, Missing   Pulmonary sleep apnea, Continuous Positive Airway Pressure Ventilation and Oxygen sleep apnea ,    Pulmonary exam normal breath sounds clear to auscultation       Cardiovascular hypertension, Pt. on medications and Pt. on home beta blockers + Peripheral Vascular Disease  Normal cardiovascular exam Rhythm:Regular Rate:Normal  ECG: ST, rate 119  ECHO: LV EF: 65% -   70%   Neuro/Psych Seizures -,  PSYCHIATRIC DISORDERS Depression Schizophrenia Hemiparesis affecting left side as late effect of cerebrovascular accident Parkinson's disease CVA, Residual Symptoms    GI/Hepatic (+)     substance abuse  ,   Endo/Other  diabetesHypothyroidism Morbid obesity  Renal/GU Renal disease     Musculoskeletal negative musculoskeletal ROS (+) Chronic pain syndrome   Abdominal   Peds  Hematology  (+) anemia , HLD   Anesthesia Other Findings BILATERAL RENAL CALCULI  Reproductive/Obstetrics                           Anesthesia Physical Anesthesia Plan  ASA: III  Anesthesia Plan: General   Post-op Pain Management:    Induction: Intravenous  PONV Risk Score and Plan: 3 and Ondansetron, Dexamethasone and Treatment may vary due to age or medical condition  Airway Management Planned: Oral ETT  Additional Equipment:   Intra-op Plan:   Post-operative Plan:   Informed Consent: I have reviewed the patients History and Physical, chart, labs and discussed the procedure including the risks, benefits and alternatives for the proposed anesthesia with the patient or authorized representative who has indicated his/her  understanding and acceptance.   Dental advisory given  Plan Discussed with: CRNA  Anesthesia Plan Comments:        Anesthesia Quick Evaluation

## 2017-09-02 NOTE — Transfer of Care (Signed)
Immediate Anesthesia Transfer of Care Note  Patient: BELLANY ELBAUM  Procedure(s) Performed: CYSTOSCOPY/ URETEROSCOPY/ RETROGRADE PYELOGRAM/ STENT EXCHANGE/ STONE EXTRACTION (Bilateral ) HOLMIUM LASER APPLICATION (Left )  Patient Location: PACU  Anesthesia Type:General  Level of Consciousness: drowsy and patient cooperative  Airway & Oxygen Therapy: Patient Spontanous Breathing and Patient connected to face mask oxygen  Post-op Assessment: Report given to RN and Post -op Vital signs reviewed and stable  Post vital signs: Reviewed and stable  Last Vitals:  Vitals Value Taken Time  BP 128/85 09/02/2017  3:48 PM  Temp    Pulse 83 09/02/2017  3:54 PM  Resp 24 09/02/2017  3:54 PM  SpO2 97 % 09/02/2017  3:54 PM  Vitals shown include unvalidated device data.  Last Pain:  Vitals:   09/02/17 1548  TempSrc:   PainSc: (P) Asleep      Patients Stated Pain Goal: 4 (34/35/68 6168)  Complications: No apparent anesthesia complications

## 2017-09-02 NOTE — Interval H&P Note (Signed)
History and Physical Interval Note:  09/02/2017 12:55 PM  [name]  has presented today for surgery, with the diagnosis of BILATERAL RENAL CALCULI  The various methods of treatment have been discussed with the patient and family. After consideration of risks, benefits and other options for treatment, the patient has consented to  Procedure(s): CYSTOSCOPY/URETEROSCOPY/HOLMIUM LASER/STENT PLACEMENT ALSO STONE EXTRACTION (Bilateral) CYSTOSCOPY WITH STENT REMOVAL (Bilateral) as a surgical intervention .  The patient's history has been reviewed, patient examined, no change in status, stable for surgery.  I have reviewed the patient's chart and labs.  Questions were answered to the patient's satisfaction.     [name] Dryden Tapley

## 2017-09-02 NOTE — Anesthesia Procedure Notes (Signed)
Procedure Name: Intubation Date/Time: 09/02/2017 1:24 PM Performed by: West Pugh, CRNA Pre-anesthesia Checklist: Patient identified, Emergency Drugs available, Suction available, Patient being monitored and Timeout performed Patient Re-evaluated:Patient Re-evaluated prior to induction Oxygen Delivery Method: Circle system utilized Preoxygenation: Pre-oxygenation with 100% oxygen Induction Type: IV induction and Cricoid Pressure applied Ventilation: Mask ventilation without difficulty Laryngoscope Size: Mac and 4 Grade View: Grade III Tube type: Oral Tube size: 7.0 mm Number of attempts: 1 Airway Equipment and Method: Stylet Placement Confirmation: ETT inserted through vocal cords under direct vision,  positive ETCO2,  CO2 detector and breath sounds checked- equal and bilateral Secured at: 22 cm Tube secured with: Tape Dental Injury: Teeth and Oropharynx as per pre-operative assessment

## 2017-09-03 ENCOUNTER — Observation Stay (HOSPITAL_COMMUNITY): Payer: Medicare Other

## 2017-09-03 ENCOUNTER — Encounter (HOSPITAL_COMMUNITY): Payer: Self-pay | Admitting: Urology

## 2017-09-03 DIAGNOSIS — Z888 Allergy status to other drugs, medicaments and biological substances status: Secondary | ICD-10-CM | POA: Diagnosis not present

## 2017-09-03 DIAGNOSIS — M353 Polymyalgia rheumatica: Secondary | ICD-10-CM | POA: Diagnosis present

## 2017-09-03 DIAGNOSIS — L8990 Pressure ulcer of unspecified site, unspecified stage: Secondary | ICD-10-CM

## 2017-09-03 DIAGNOSIS — G20 Parkinson's disease: Secondary | ICD-10-CM | POA: Diagnosis present

## 2017-09-03 DIAGNOSIS — G4731 Primary central sleep apnea: Secondary | ICD-10-CM | POA: Diagnosis present

## 2017-09-03 DIAGNOSIS — G894 Chronic pain syndrome: Secondary | ICD-10-CM | POA: Diagnosis present

## 2017-09-03 DIAGNOSIS — F329 Major depressive disorder, single episode, unspecified: Secondary | ICD-10-CM | POA: Diagnosis present

## 2017-09-03 DIAGNOSIS — F5101 Primary insomnia: Secondary | ICD-10-CM | POA: Diagnosis present

## 2017-09-03 DIAGNOSIS — G40909 Epilepsy, unspecified, not intractable, without status epilepticus: Secondary | ICD-10-CM | POA: Diagnosis present

## 2017-09-03 DIAGNOSIS — I69354 Hemiplegia and hemiparesis following cerebral infarction affecting left non-dominant side: Secondary | ICD-10-CM | POA: Diagnosis not present

## 2017-09-03 DIAGNOSIS — E039 Hypothyroidism, unspecified: Secondary | ICD-10-CM | POA: Diagnosis present

## 2017-09-03 DIAGNOSIS — K219 Gastro-esophageal reflux disease without esophagitis: Secondary | ICD-10-CM | POA: Diagnosis present

## 2017-09-03 DIAGNOSIS — Z885 Allergy status to narcotic agent status: Secondary | ICD-10-CM | POA: Diagnosis not present

## 2017-09-03 DIAGNOSIS — Z79899 Other long term (current) drug therapy: Secondary | ICD-10-CM | POA: Diagnosis not present

## 2017-09-03 DIAGNOSIS — Z7982 Long term (current) use of aspirin: Secondary | ICD-10-CM | POA: Diagnosis not present

## 2017-09-03 DIAGNOSIS — E785 Hyperlipidemia, unspecified: Secondary | ICD-10-CM | POA: Diagnosis present

## 2017-09-03 DIAGNOSIS — Z87442 Personal history of urinary calculi: Secondary | ICD-10-CM | POA: Diagnosis not present

## 2017-09-03 DIAGNOSIS — E1151 Type 2 diabetes mellitus with diabetic peripheral angiopathy without gangrene: Secondary | ICD-10-CM | POA: Diagnosis present

## 2017-09-03 DIAGNOSIS — Z6841 Body Mass Index (BMI) 40.0 and over, adult: Secondary | ICD-10-CM | POA: Diagnosis not present

## 2017-09-03 DIAGNOSIS — I10 Essential (primary) hypertension: Secondary | ICD-10-CM | POA: Diagnosis present

## 2017-09-03 DIAGNOSIS — Z87440 Personal history of urinary (tract) infections: Secondary | ICD-10-CM | POA: Diagnosis not present

## 2017-09-03 DIAGNOSIS — N202 Calculus of kidney with calculus of ureter: Secondary | ICD-10-CM | POA: Diagnosis present

## 2017-09-03 DIAGNOSIS — Z9989 Dependence on other enabling machines and devices: Secondary | ICD-10-CM | POA: Diagnosis not present

## 2017-09-03 DIAGNOSIS — E1136 Type 2 diabetes mellitus with diabetic cataract: Secondary | ICD-10-CM | POA: Diagnosis present

## 2017-09-03 LAB — CBC WITH DIFFERENTIAL/PLATELET
Basophils Absolute: 0 10*3/uL (ref 0.0–0.1)
Basophils Relative: 0 %
EOS ABS: 0 10*3/uL (ref 0.0–0.7)
EOS PCT: 0 %
HCT: 35.7 % — ABNORMAL LOW (ref 36.0–46.0)
Hemoglobin: 10.5 g/dL — ABNORMAL LOW (ref 12.0–15.0)
Lymphocytes Relative: 7 %
Lymphs Abs: 1.8 10*3/uL (ref 0.7–4.0)
MCH: 28.6 pg (ref 26.0–34.0)
MCHC: 29.4 g/dL — ABNORMAL LOW (ref 30.0–36.0)
MCV: 97.3 fL (ref 78.0–100.0)
Monocytes Absolute: 1.2 10*3/uL — ABNORMAL HIGH (ref 0.1–1.0)
Monocytes Relative: 5 %
NEUTROS PCT: 88 %
Neutro Abs: 22.6 10*3/uL (ref 1.7–7.7)
PLATELETS: 171 10*3/uL (ref 150–400)
RBC: 3.67 MIL/uL — ABNORMAL LOW (ref 3.87–5.11)
RDW: 15.5 % (ref 11.5–15.5)
WBC MORPHOLOGY: INCREASED
WBC: 25.7 10*3/uL — ABNORMAL HIGH (ref 4.0–10.5)

## 2017-09-03 LAB — APTT: aPTT: 54 seconds — ABNORMAL HIGH (ref 24–36)

## 2017-09-03 LAB — LACTIC ACID, PLASMA
Lactic Acid, Venous: 0.9 mmol/L (ref 0.5–1.9)
Lactic Acid, Venous: 1.4 mmol/L (ref 0.5–1.9)

## 2017-09-03 LAB — GLUCOSE, CAPILLARY
Glucose-Capillary: 124 mg/dL — ABNORMAL HIGH (ref 70–99)
Glucose-Capillary: 112 mg/dL — ABNORMAL HIGH (ref 70–99)

## 2017-09-03 LAB — PROCALCITONIN: Procalcitonin: 38.93 ng/mL

## 2017-09-03 LAB — PROTIME-INR
INR: 1.05
PROTHROMBIN TIME: 13.6 s (ref 11.4–15.2)

## 2017-09-03 LAB — MRSA PCR SCREENING: MRSA BY PCR: NEGATIVE

## 2017-09-03 MED ORDER — JUVEN PO PACK
1.0000 | PACK | Freq: Two times a day (BID) | ORAL | Status: DC
  Administered 2017-09-03 – 2017-09-05 (×4): 1 via ORAL
  Filled 2017-09-03 (×5): qty 1

## 2017-09-03 NOTE — Progress Notes (Signed)
1 Day Post-Op Subjective: Patient reports that she is having no pain.  She has had fever and some mild tachycardia.  Objective: Vital signs in last 24 hours: Temp:  [97.5 F (36.4 C)-101.6 F (38.7 C)] 97.5 F (36.4 C) (06/28 0559) Pulse Rate:  [64-100] 64 (06/28 0559) Resp:  [16-30] 20 (06/28 0559) BP: (96-140)/(51-114) 107/64 (06/28 0559) SpO2:  [92 %-100 %] 98 % (06/28 0559) Weight:  [117 kg (258 lb)] 117 kg (258 lb) (06/27 1102)  Intake/Output from previous day: 06/27 0701 - 06/28 0700 In: 2037.5 [I.V.:1730.8; IV Piggyback:306.7] Out: 375 [Urine:375] Intake/Output this shift: No intake/output data recorded.  Physical Exam:  Constitutional: Vital signs reviewed. WD WN in NAD.  She is responsive this morning, more so than usual postoperatively Eyes: PERRL, No scleral icterus.   Cardiovascular: RRR Pulmonary/Chest: Normal effort   Lab Results: Recent Labs    09/02/17 1100 09/02/17 1624  HGB 10.0* 11.7*  HCT 33.4* 39.4   BMET Recent Labs    09/02/17 1100 09/02/17 1624  NA 144  --   K 3.0*  --   CL 102  --   CO2 33*  --   GLUCOSE 103*  --   BUN 12  --   CREATININE 0.69 0.74  CALCIUM 8.2*  --    No results for input(s): LABPT, INR in the last 72 hours. No results for input(s): LABURIN in the last 72 hours. Results for orders placed or performed during the hospital encounter of 07/12/17  Urine Culture     Status: None   Collection Time: 07/12/17  8:01 AM  Result Value Ref Range Status   Specimen Description   Final    CYSTOSCOPY URINE Performed at [facility] [address], [place], [place] [address]    Special Requests   Final    STERILE CONTAINER Performed at [facility], [place] [address] [place], [place] [address]    Culture   Final    NO GROWTH Performed at [facility], [place] [address], [place], [place] [address]    Report Status 07/13/2017 FINAL  Final    Studies/Results: Dg C-arm 1-60 Min-no  Report  Result Date: 09/02/2017 Fluoroscopy was utilized by the requesting physician.  No radiographic interpretation.    Assessment/Plan:   Postoperative day #1 bilateral ureteroscopy.  Preoperative IV antibiotic management prior to her procedure.  Despite this, she is febrile.  I have covered for fungal infection, and she is on broad-spectrum IV antibiotics.  We will call a code sepsis due to her prior history of sepsis.   LOS: 0 days   Jorja Loa 09/03/2017, 7:57 AM

## 2017-09-03 NOTE — Progress Notes (Signed)
Initial Nutrition Assessment  DOCUMENTATION CODES:   Morbid obesity  INTERVENTION:  - Will order Juven BID, each packet provides 80 calories and 14 grams of amino acids; supplement contains CaHMB, glutamine, and arginine, to promote wound healing. - Continue to encourage PO intakes.    NUTRITION DIAGNOSIS:   Inadequate oral intake related to lethargy/confusion, acute illness as evidenced by other (comment)(RN report).  GOAL:   Patient will meet greater than or equal to 90% of their needs  MONITOR:   PO intake, Supplement acceptance, Weight trends, Labs, Skin  REASON FOR ASSESSMENT:   Other (Comment)(Pressure Injury screening report)  ASSESSMENT:   63 y.o. year old female Who presents at this time for repeat bilateral ureteroscopic management of bilateral renal calculi.  She has recurrent urinary tract infections, and has a history of urgent stenting of an infected, obstructed ureteral stone earlier this year.  She did have ureteroscopic/holmium laser management of a right renal stone, but has some burden left in the right kidney as well as left renal calculi.  She has bilateral stents and presents at this time for ureteroscopy.  She has completed a recent course of Invanz at the nursing home facility where she resides.  No family/visitors present at this time. No intakes documented since admission. Spoke with RN outside of patient's room. She reports that patient has been very lethargic throughout the morning. She states that patient is alert and oriented x4 but that she mainly keeps her eyes shut and she quickly falls back to sleep during discussions. RN reports she was informed that this is patient's usual the day after surgery. She has not given patient AM medications or breakfast d/t lethargy.   Patient is POD #1 cystoscopy, ureteroscopy, holmium laser, stent placement and stone extraction for bilateral renal calculi. NFPE outlined below. Per chart review, patient has lost 12 lbs  (4% body weight) in the past 1.5-2 months. This is not significant for time frame.  Medications reviewed; 20 mg oral Lasix BID, 20 g oral lactulose/day, 100 mcg oral Synthroid/day, 1 packet Miralax/day. Labs reviewed; CBG: 124 mg/dL this AM, K: 3 mmol/L, Ca: 8.2 mg/dL,.  IVF: 1/2 NS @ 50 mL/hr.     NUTRITION - FOCUSED PHYSICAL EXAM:   Completed; no muscle and no fat wasting at this time; moderate BLE edema noted.   Diet Order:   Diet Order           Diet regular Room service appropriate? Yes; Fluid consistency: Thin  Diet effective now          EDUCATION NEEDS:   No education needs have been identified at this time  Skin:  Skin Assessment: Skin Integrity Issues: Skin Integrity Issues:: Stage II Stage II: coccyx  Last BM:  PTA/unknown  Height:   Ht Readings from Last 1 Encounters:  09/02/17 5\' 1"  (1.549 m)    Weight:   Wt Readings from Last 1 Encounters:  09/02/17 258 lb (117 kg)    Ideal Body Weight:  47.73 kg  BMI:  Body mass index is 48.75 kg/m.  Estimated Nutritional Needs:   Kcal:  1755-1990 (15-17 kcal/kg)  Protein:  100-115 grams  Fluid:  >/= 1.8 L/day     Jarome Matin, MS, RD, LDN, [facility] Inpatient Clinical Dietitian Pager # [phone] After hours/weekend pager # [phone]

## 2017-09-03 NOTE — Op Note (Addendum)
Preoperative diagnosis: Bilateral renal calculi  Postoperative diagnosis: Same  Principal procedure: Cystoscopy, bilateral double-J stent extraction, bilateral retrograde ureteral pyelograms with fluoroscopic interpretation, bilateral ureteroscopy, holmium laser fragmentation and extraction of bilateral renal calculi, bilateral double-J stent placement  Surgeon: Trish Mancinelli  Anesthesia: General endotracheal  Complications: None  Specimen: Multiple stones from bilateral kidneys  Drains: Bilateral 24 cm x 6 French contour double-J stent without string  Estimated blood loss: Less than 10 mL  Indications: 62 year old female with multiple medical problems including history of CVA with quadriparesis.  She has presented a few months ago with obstructing right ureteral stone and pyelonephrosis/sepsis.  She had bilateral double-J stent placements because of the significant stone burden in her left side as well.  Unfortunately, management of her stones has been difficult due to recurrent urinary tract infections and, despite IV antibiotics prior to her procedures, recurrent sepsis.  She currently has bilateral double-J stent placements and has been on Invanz recently for positive culture sensitive to this medication.  She has had 5 days of this medication presents at this time for repeat bilateral ureteroscopy to try to read the patient of her stone burden.  She and her family are aware of the procedure as well as risks and complications including recurrent infection, ureteral injury, the need for general anesthetic and hospitalization because of her medical condition.  She desires to proceed.  Description of procedure: The patient was properly identified in the holding area.  She received IV antibiotics.  She was taken to the operating room where general anesthetic was administered.  She was placed in the dorsolithotomy position.  Positioning was difficult due to her physical deformities.  Following  positioning, she was prepped and draped.  Proper timeout was performed.  A 22 French panendoscope was advanced into her bladder.  The left double-J stent was grasped and brought through the ureteral orifice.  Sensor tip guidewire was placed through the stent and up into the left upper pole calyceal system using fluoroscopic guidance.  The stent was further removed.  The ureter was dilated with the obturator of the 12/14 ureteral access catheter.  The entire access catheter was then placed over the guidewire which was removed.  Flexible ureteroscope was then passed.  Inspection of the pyelocalyceal system was then performed.  The urine was quite clear within the pyelocalyceal system.  There was one upper pole stone which was easily grasped and small enough to bring through the ureteral access catheter.  2 calyces in the lower pole were involved with multiple stones.  These were large enough that I knew that several of them could not be brought through the ureteral access catheter without fragmentation.  This was then performed with a 200 m laser fiber using the laser set at 0.8 power and frequency of 30 Hz.  These stones were then fragmented.  I tried to remove all of the fragments using the basket through the ureteral access catheter.  All but the most tiny fragments were grasped and extracted.  Several tiny 1 mm or less fragments could not be adequately grasped and were left indwelling.  Further inspection of the entire left pyelocalyceal system revealed no further significant stone burden.  At this point, the scope was removed.  The sensor tip guidewire was reintroduced in the access sheath removed.  Cystoscopically I then replaced a 24 cm x 6 French contour double-J stent without the tether.  Prior to placing the access catheter, retrograde filling of the pyelocalyceal system with contrast using a  6 [place] open-ended catheter revealed no significant filling defects and moderate pyelocaliectasis.  Similar  procedure was then performed on the right side once the double-J stent was removed.  Using the flexible ureteroscope, pyelocalyceal system was inspected.  Again, the urine was clear.  Inspection revealed no stones over 2 mm in size.  There had been fragments left from the prior procedure.  The majority of these were removed with the engage basket such that I felt that all fragments over 1 mm were removed.  I spent some time picking out the small little fragments from the residual treatment from the last case.  At this point, there being no significant fragments, I removed the ureteroscope.  Again, retrograde study of the pyelocalyceal system was performed with Omnipaque.  This revealed less pyelocalyceal dilatation in the left side and no filling defects.  At this point, double-J stent was placed, again a 24 cm x 6 French contour stent without tether.  The bladder was drained and a 16 French Foley catheter placed and filled with 10 cc in the balloon.  This was placed to dependent drainage.  At this point, the procedure was terminated.  The patient was then awakened and taken to the PACU in stable condition, having tolerated procedure well.

## 2017-09-04 LAB — BASIC METABOLIC PANEL
Anion gap: 8 (ref 5–15)
BUN: 20 mg/dL (ref 8–23)
CO2: 31 mmol/L (ref 22–32)
CREATININE: 0.86 mg/dL (ref 0.44–1.00)
Calcium: 7.7 mg/dL — ABNORMAL LOW (ref 8.9–10.3)
Chloride: 104 mmol/L (ref 98–111)
GFR calc Af Amer: >60 mL/min (ref >60)
GLUCOSE: 124 mg/dL — AB (ref 70–99)
Potassium: 2.8 mmol/L — ABNORMAL LOW (ref 3.5–5.1)
SODIUM: 143 mmol/L (ref 135–145)

## 2017-09-04 LAB — CBC
HEMATOCRIT: 29.3 % — AB (ref 36.0–46.0)
HEMOGLOBIN: 8.9 g/dL — AB (ref 12.0–15.0)
MCH: 28.9 pg (ref 26.0–34.0)
MCHC: 30.4 g/dL (ref 30.0–36.0)
MCV: 95.1 fL (ref 78.0–100.0)
PLATELETS: 139 10*3/uL — AB (ref 150–400)
RBC: 3.08 MIL/uL — ABNORMAL LOW (ref 3.87–5.11)
RDW: 16 % — AB (ref 11.5–15.5)
WBC: 18.8 10*3/uL — AB (ref 4.0–10.5)

## 2017-09-04 LAB — URINE CULTURE: Culture: NO GROWTH

## 2017-09-04 NOTE — Progress Notes (Signed)
2 Days Post-Op Subjective: Patient reports no pain.  She is alert, oriented.  She did not sleep well last night.  Objective: Vital signs in last 24 hours: Temp:  [96.4 F (35.8 C)-98.8 F (37.1 C)] 98 F (36.7 C) (06/29 7893) Pulse Rate:  [66-89] 89 (06/29 0657) Resp:  [13-20] 20 (06/29 0652) BP: (104-114)/(59-70) 114/65 (06/29 0652) SpO2:  [85 %-100 %] 93 % (06/29 0657)  Intake/Output from previous day: 06/28 0701 - 06/29 0700 In: 2240 [P.O.:700; I.V.:1150; IV Piggyback:390] Out: 700 [Urine:700] Intake/Output this shift: No intake/output data recorded.  Physical Exam:  Constitutional: Vital signs reviewed. WD WN in NAD   Eyes: PERRL, No scleral icterus.   Cardiovascular: RRR Pulmonary/Chest: Normal effort Urine is still slightly bloody.  Lab Results: Recent Labs    09/02/17 1100 09/02/17 1624 09/03/17 0900  HGB 10.0* 11.7* 10.5*  HCT 33.4* 39.4 35.7*   BMET Recent Labs    09/02/17 1100 09/02/17 1624  NA 144  --   K 3.0*  --   CL 102  --   CO2 33*  --   GLUCOSE 103*  --   BUN 12  --   CREATININE 0.69 0.74  CALCIUM 8.2*  --    Recent Labs    09/03/17 0900  INR 1.05   No results for input(s): LABURIN in the last 72 hours. Results for orders placed or performed during the hospital encounter of 09/02/17  Culture, Urine     Status: None   Collection Time: 09/03/17  8:09 AM  Result Value Ref Range Status   Specimen Description   Final    URINE, CATHETERIZED Performed at [facility] [address] [place], [place] [address]    Special Requests   Final    NONE Performed at [facility], [place] [address], [place], [place] [address]    Culture   Final    NO GROWTH Performed at [facility], [place] [address] [place], [place] [address]    Report Status 09/04/2017 FINAL  Final  Culture, blood (x 2)     Status: None (Preliminary result)   Collection Time: 09/03/17  8:59 AM  Result Value Ref Range Status   Specimen Description   Final    BLOOD LEFT ANTECUBITAL Performed at [facility] [address], [place], [place] [address]    Special Requests   Final    BOTTLES DRAWN AEROBIC ONLY Blood Culture adequate volume Performed at [facility] [address], [place], [place] [address]    Culture   Final    NO GROWTH < 24 HOURS Performed at [facility] [address] [place], [place] [address]    Report Status PENDING  Incomplete  Culture, blood (x 2)     Status: None (Preliminary result)   Collection Time: 09/03/17  9:00 AM  Result Value Ref Range Status   Specimen Description   Final    BLOOD LEFT HAND Performed at [facility] [address], [place], [place] [address]    Special Requests   Final    BOTTLES DRAWN AEROBIC ONLY Blood Culture adequate volume Performed at [facility] [address] [place], [place] [address]    Culture   Final    NO GROWTH < 24 HOURS Performed at [facility] [address] [place],  [address]    Report Status PENDING  Incomplete  MRSA PCR Screening  Status: None   Collection Time: 09/03/17  9:15 AM  Result Value Ref Range Status   MRSA by PCR NEGATIVE NEGATIVE Final    Comment:        The GeneXpert MRSA Assay (FDA approved for NASAL specimens only), is one component of a comprehensive MRSA colonization surveillance program. It is not intended to diagnose MRSA infection nor to guide or monitor treatment for MRSA infections. Performed at [facility], [place] [address] [place], [place] [address]     Studies/Results: Dg Chest Port 1 View  Result Date: 09/03/2017 CLINICAL DATA:  Sepsis EXAM: PORTABLE CHEST 1 VIEW COMPARISON:  04/21/2017 FINDINGS: Right upper extremity PICC placed. Tip is in the lower SVC. Left perihilar airspace disease. Lungs are under aerated. The upper mediastinum remains prominent. Heart is normal  in size. IMPRESSION: Right upper extremity PICC placed with its tip in the lower SVC. Left perihilar airspace disease. Electronically Signed   By: Marybelle Killings M.D.   On: 09/03/2017 09:02   Dg C-arm 1-60 Min-no Report  Result Date: 09/02/2017 Fluoroscopy was utilized by the requesting physician.  No radiographic interpretation.   Blood and urine cultures were negative.  Postoperative day #2 Assessment/Plan:   Cystoscopy, bilateral ureteroscopy with stone management and stent placement.  She was febrile postoperatively, but is doing well now.  Cultures have been negative.    I will keep her 1 more day for antibiotic management and transfer back to nursing home tomorrow if possible.   LOS: 1 day   [name] 09/04/2017, 10:34 AM

## 2017-09-04 NOTE — Progress Notes (Signed)
Patient has been calling out to stuff  for stuff most part of this shift. Has not slept at all. No acute distress noted. Medicated with PRN Oxycodone x 2 and Tylenol for multiple pain sites. Will continue to monitor.

## 2017-09-05 MED ORDER — FLUCONAZOLE 100 MG PO TABS: 100.0000 mg | ORAL_TABLET | Freq: Every day | ORAL | 0 refills | Status: DC

## 2017-09-05 NOTE — Discharge Summary (Signed)
Patient ID: [name] MRN: [id number] DOB/AGE: 62/14/1957 62 y.o.  Admit date: 09/02/2017 Discharge date: 09/05/2017  Primary Care Physician:  Hilbert Corrigan, MD  Discharge Diagnoses: Bilateral renal calculi Admission diagnosis: Bilateral renal calculi  Consults: None   Discharge Medications: Allergies as of 09/05/2017      Reactions   Codeine Other (See Comments)   unknown   Lacosamide Other (See Comments)   Mood changes/agitation   Morphine Other (See Comments)   No rx noted    Zolpidem Other (See Comments)   Per MAR      Medication List    STOP taking these medications   INVANZ 1 g injection Generic drug:  ertapenem     TAKE these medications   acetaminophen 325 MG tablet Commonly known as:  TYLENOL Take 2 tablets (650 mg total) by mouth every 4 (four) hours as needed for mild pain (or temp > 37.5 C (99.5 F)).   AMINO ACIDS-PROTEIN HYDROLYS PO Take 30 mLs by mouth 2 (two) times daily.   amLODipine 10 MG tablet Commonly known as:  NORVASC Take 1 tablet (10 mg total) by mouth daily.   aspirin EC 81 MG tablet Take 81 mg by mouth daily.   CERTAGEN PO Take 1 tablet by mouth daily.   cloNIDine 0.1 MG tablet Commonly known as:  CATAPRES Take 0.1 mg by mouth every 6 (six) hours as needed (for SBP greater than 160).   divalproex 125 MG DR tablet Commonly known as:  DEPAKOTE Take 125 mg by mouth 2 (two) times daily.   divalproex 500 MG DR tablet Commonly known as:  DEPAKOTE Take 500 mg by mouth 2 (two) times daily.   DULoxetine 20 MG capsule Commonly known as:  CYMBALTA Take 20 mg by mouth daily.   DULoxetine 60 MG capsule Commonly known as:  CYMBALTA Take 60 mg by mouth daily.   fluconazole 100 MG tablet Commonly known as:  DIFLUCAN Take 1 tablet (100 mg total) by mouth daily.   furosemide 20 MG tablet Commonly known as:  LASIX Take 20 mg by mouth 2 (two) times daily.   heparin flush 10 UNIT/ML Soln injection Inject 50 Units into  the vein 3 (three) times daily.   labetalol 200 MG tablet Commonly known as:  NORMODYNE Take 200 mg by mouth 2 (two) times daily.   lactulose (encephalopathy) 10 GM/15ML Soln Commonly known as:  CHRONULAC Take 20 g by mouth daily.   levETIRAcetam 500 MG tablet Commonly known as:  KEPPRA Take 1,000-1,500 mg by mouth See admin instructions. Take 1000 mg by mouth in the morning and take 1500 mg by mouth at bedtime   levothyroxine 100 MCG tablet Commonly known as:  SYNTHROID, LEVOTHROID Take 100 mcg by mouth daily before breakfast.   lidocaine 5 % Commonly known as:  LIDODERM Place 1 patch onto the skin daily. Remove & Discard patch within 12 hours or as directed by MD   loratadine 10 MG tablet Commonly known as:  CLARITIN Take 10 mg by mouth daily.   losartan 50 MG tablet Commonly known as:  COZAAR Take 50 mg by mouth at bedtime.   Menthol (Topical Analgesic) 5 % Gel Apply 1 application topically every 6 (six) hours as needed (for pain).   Normal Saline Flush 0.9 % Soln Inject 10 mLs into the vein 3 (three) times daily. Every shift   omeprazole 20 MG capsule Commonly known as:  PRILOSEC Take 20 mg by mouth daily.   oxyCODONE 5  MG immediate release tablet Commonly known as:  Oxy IR/ROXICODONE Take 1 tablet (5 mg total) by mouth every 8 (eight) hours as needed. What changed:  when to take this   PATADAY 0.2 % Soln Generic drug:  Olopatadine HCl Place 1 drop into both eyes at bedtime.   polyethylene glycol packet Commonly known as:  MIRALAX / GLYCOLAX Take 17 g by mouth daily.   PREPARATION H 5-14.4 % Crea Generic drug:  Lidocaine-Glycerin Place 1 application rectally every 4 (four) hours as needed (hemorrhoids).   rosuvastatin 5 MG tablet Commonly known as:  CRESTOR Take 5 mg by mouth at bedtime.   SYSTANE 0.4-0.3 % Soln Generic drug:  Polyethyl Glycol-Propyl Glycol Place 1 drop into both eyes every 8 (eight) hours as needed (for dry eyes).   traZODone 50  MG tablet Commonly known as:  DESYREL Take 50 mg by mouth at bedtime.   vitamin C 500 MG tablet Commonly known as:  ASCORBIC ACID Take 500 mg by mouth daily.   zinc sulfate 220 (50 Zn) MG capsule Take 220 mg by mouth daily.        Significant Diagnostic Studies:  Dg Chest Port 1 View  Result Date: 09/03/2017 CLINICAL DATA:  Sepsis EXAM: PORTABLE CHEST 1 VIEW COMPARISON:  04/21/2017 FINDINGS: Right upper extremity PICC placed. Tip is in the lower SVC. Left perihilar airspace disease. Lungs are under aerated. The upper mediastinum remains prominent. Heart is normal in size. IMPRESSION: Right upper extremity PICC placed with its tip in the lower SVC. Left perihilar airspace disease. Electronically Signed   By: Marybelle Killings M.D.   On: 09/03/2017 09:02   Dg C-arm 1-60 Min-no Report  Result Date: 09/02/2017 Fluoroscopy was utilized by the requesting physician.  No radiographic interpretation.    Brief H and P: For complete details please refer to admission H and P, but in brief 62 year old female with bilateral renal calculi and resistant urinary tract infections.  She is stented.  She presented to the hospital for further management of her bilateral renal calculi, having been treated with Invanz for positive cultures prior to admission.  Hospital Course: The patient went directly to the operating room upon admission.  She underwent cystoscopy, bilateral ureteroscopy, stone removal following laser management of her stone burden.  Stents were placed.  She did experience fevers postoperatively, but was covered with broad-spectrum antibiotics, and all cultures--blood and urine--were negative.  By postoperative day #3 she was eating well, she was mentating normally and having no significant issues.  Her PICC line and Foley catheter were removed.  She was transferred to her nursing home. Active Problems:   Calculus of kidney   Pressure injury of skin   Day of Discharge BP 119/76 (BP Location:  Left Arm)   Pulse 94   Temp 98.1 F (36.7 C) (Axillary)   Resp 18   Ht 5\' 1"  (1.549 m)   Wt 117 kg (258 lb) Comment: pt stated they weighed her last week, unable to weigh today   SpO2 92%   BMI 48.75 kg/m   Results for orders placed or performed during the hospital encounter of 09/02/17 (from the past 24 hour(s))  CBC     Status: Abnormal   Collection Time: 09/04/17  1:02 PM  Result Value Ref Range   WBC 18.8 (H) 4.0 - 10.5 K/uL   RBC 3.08 (L) 3.87 - 5.11 MIL/uL   Hemoglobin 8.9 (L) 12.0 - 15.0 g/dL   HCT 29.3 (L) 36.0 - 46.0 %  MCV 95.1 78.0 - 100.0 fL   MCH 28.9 26.0 - 34.0 pg   MCHC 30.4 30.0 - 36.0 g/dL   RDW 16.0 (H) 11.5 - 15.5 %   Platelets 139 (L) 150 - 400 K/uL  Basic metabolic panel     Status: Abnormal   Collection Time: 09/04/17  1:02 PM  Result Value Ref Range   Sodium 143 135 - 145 mmol/L   Potassium 2.8 (L) 3.5 - 5.1 mmol/L   Chloride 104 98 - 111 mmol/L   CO2 31 22 - 32 mmol/L   Glucose, Bld 124 (H) 70 - 99 mg/dL   BUN 20 8 - 23 mg/dL   Creatinine, Ser 0.86 0.44 - 1.00 mg/dL   Calcium 7.7 (L) 8.9 - 10.3 mg/dL   GFR calc non Af Amer >60 >60 mL/min   GFR calc Af Amer >60 >60 mL/min   Anion gap 8 5 - 15    Physical Exam: General: Alert and awake oriented x3 not in any acute distress. HEENT: anicteric sclera, pupils reactive to light and accommodation CVS: S1-S2 clear no murmur rubs or gallops Chest: clear to auscultation bilaterally, no wheezing rales or rhonchi Abdomen: soft nontender, nondistended, normal bowel sounds, no organomegaly Extremities: no cyanosis, clubbing or edema noted bilaterally Neuro: Cranial nerves II-XII intact,   Disposition: She will be transferred to her nursing home  Diet: Regular  Activity: Usual bedrest   Disposition and Follow-up: Discharge Instructions    Care order/instruction   Complete by:  As directed    D/c picc   Foley catheter - discontinue   Complete by:  As directed      We will call for  follow-up  TESTS THAT NEED FOLLOW-UP None  DISCHARGE FOLLOW-UP   Time spent on Discharge: 15 minutes  Signed: [name] Shanette Tamargo 09/05/2017, 8:19 AM

## 2017-09-05 NOTE — Progress Notes (Signed)
Patient will discharge to [facility] Anticipated discharge date: 6/30 Family notified: at bedside Transportation by [facility]- called at 1:25pm  CSW signing off.  Jorge Ny, LCSW Clinical Social Worker [phone]

## 2017-09-08 LAB — CULTURE, BLOOD (ROUTINE X 2)
Culture: NO GROWTH
Culture: NO GROWTH
SPECIAL REQUESTS: ADEQUATE
Special Requests: ADEQUATE

## 2017-09-21 ENCOUNTER — Ambulatory Visit (INDEPENDENT_AMBULATORY_CARE_PROVIDER_SITE_OTHER): Payer: Medicare Other | Admitting: Urology

## 2017-09-21 ENCOUNTER — Other Ambulatory Visit (HOSPITAL_COMMUNITY)
Admission: AD | Admit: 2017-09-21 | Discharge: 2017-09-21 | Disposition: A | Discharge status: Home / Self Care | Payer: Medicare Other | Source: Skilled Nursing Facility | Attending: Urology | Admitting: Urology

## 2017-09-21 DIAGNOSIS — N200 Calculus of kidney: Secondary | ICD-10-CM | POA: Diagnosis not present

## 2017-09-21 DIAGNOSIS — N390 Urinary tract infection, site not specified: Secondary | ICD-10-CM

## 2017-09-24 LAB — URINE CULTURE: Culture: >=100000 — AB

## 2017-10-04 ENCOUNTER — Encounter: Payer: Self-pay | Admitting: Hematology

## 2017-10-05 ENCOUNTER — Ambulatory Visit (INDEPENDENT_AMBULATORY_CARE_PROVIDER_SITE_OTHER): Payer: Medicare Other | Admitting: Urology

## 2017-10-05 ENCOUNTER — Other Ambulatory Visit (HOSPITAL_COMMUNITY)
Admission: RE | Admit: 2017-10-05 | Discharge: 2017-10-05 | Disposition: A | Discharge status: Home / Self Care | Payer: Medicare Other | Source: Ambulatory Visit | Attending: Urology | Admitting: Urology

## 2017-10-05 DIAGNOSIS — N200 Calculus of kidney: Secondary | ICD-10-CM | POA: Insufficient documentation

## 2017-10-12 LAB — SUSCEPTIBILITY, AER + ANAEROB

## 2017-10-12 LAB — SUSCEPTIBILITY RESULT

## 2017-10-12 LAB — URINE CULTURE: Culture: >=100000 — AB

## 2017-10-18 ENCOUNTER — Encounter: Payer: Self-pay | Admitting: Hematology

## 2017-10-25 ENCOUNTER — Encounter: Payer: Self-pay | Admitting: Hematology

## 2017-11-02 ENCOUNTER — Encounter (HOSPITAL_COMMUNITY): Payer: Self-pay | Admitting: Internal Medicine

## 2017-11-02 ENCOUNTER — Inpatient Hospital Stay (HOSPITAL_COMMUNITY): Payer: Medicare Other | Attending: Internal Medicine | Admitting: Internal Medicine

## 2017-11-02 ENCOUNTER — Telehealth (HOSPITAL_COMMUNITY): Payer: Self-pay

## 2017-11-02 VITALS — BP 90/47 | HR 73 | Resp 18

## 2017-11-02 DIAGNOSIS — D638 Anemia in other chronic diseases classified elsewhere: Secondary | ICD-10-CM | POA: Diagnosis not present

## 2017-11-02 DIAGNOSIS — I69354 Hemiplegia and hemiparesis following cerebral infarction affecting left non-dominant side: Secondary | ICD-10-CM | POA: Diagnosis not present

## 2017-11-02 DIAGNOSIS — Z87440 Personal history of urinary (tract) infections: Secondary | ICD-10-CM | POA: Insufficient documentation

## 2017-11-02 DIAGNOSIS — D696 Thrombocytopenia, unspecified: Secondary | ICD-10-CM | POA: Insufficient documentation

## 2017-11-02 DIAGNOSIS — E1151 Type 2 diabetes mellitus with diabetic peripheral angiopathy without gangrene: Secondary | ICD-10-CM | POA: Insufficient documentation

## 2017-11-02 DIAGNOSIS — G620 Drug-induced polyneuropathy: Secondary | ICD-10-CM | POA: Diagnosis not present

## 2017-11-02 DIAGNOSIS — M353 Polymyalgia rheumatica: Secondary | ICD-10-CM | POA: Diagnosis not present

## 2017-11-02 DIAGNOSIS — E039 Hypothyroidism, unspecified: Secondary | ICD-10-CM | POA: Insufficient documentation

## 2017-11-02 DIAGNOSIS — N200 Calculus of kidney: Secondary | ICD-10-CM | POA: Diagnosis not present

## 2017-11-02 DIAGNOSIS — K219 Gastro-esophageal reflux disease without esophagitis: Secondary | ICD-10-CM

## 2017-11-02 DIAGNOSIS — Z79899 Other long term (current) drug therapy: Secondary | ICD-10-CM | POA: Diagnosis not present

## 2017-11-02 DIAGNOSIS — Z7982 Long term (current) use of aspirin: Secondary | ICD-10-CM | POA: Diagnosis not present

## 2017-11-02 DIAGNOSIS — I10 Essential (primary) hypertension: Secondary | ICD-10-CM | POA: Diagnosis not present

## 2017-11-02 DIAGNOSIS — M1990 Unspecified osteoarthritis, unspecified site: Secondary | ICD-10-CM | POA: Insufficient documentation

## 2017-11-02 DIAGNOSIS — G20 Parkinson's disease: Secondary | ICD-10-CM | POA: Insufficient documentation

## 2017-11-02 LAB — PROTIME-INR
INR: 1.03
Prothrombin Time: 13.4 seconds (ref 11.4–15.2)

## 2017-11-02 LAB — COMPREHENSIVE METABOLIC PANEL
ALBUMIN: 2.7 g/dL — AB (ref 3.5–5.0)
ALT: 9 U/L (ref 0–44)
ANION GAP: 10 (ref 5–15)
AST: 15 U/L (ref 15–41)
Alkaline Phosphatase: 71 U/L (ref 38–126)
BUN: 17 mg/dL (ref 8–23)
CO2: 28 mmol/L (ref 22–32)
Calcium: 8.5 mg/dL — ABNORMAL LOW (ref 8.9–10.3)
Chloride: 104 mmol/L (ref 98–111)
Creatinine, Ser: 1.05 mg/dL — ABNORMAL HIGH (ref 0.44–1.00)
GFR calc Af Amer: >60 mL/min (ref >60)
GFR calc non Af Amer: 56 mL/min — ABNORMAL LOW (ref >60)
GLUCOSE: 153 mg/dL — AB (ref 70–99)
POTASSIUM: 3.2 mmol/L — AB (ref 3.5–5.1)
Sodium: 142 mmol/L (ref 135–145)
Total Bilirubin: 0.3 mg/dL (ref 0.3–1.2)
Total Protein: 6.3 g/dL — ABNORMAL LOW (ref 6.5–8.1)

## 2017-11-02 LAB — FERRITIN: Ferritin: 79 ng/mL (ref 11–307)

## 2017-11-02 LAB — APTT: APTT: 49 s — AB (ref 24–36)

## 2017-11-02 LAB — CBC WITH DIFFERENTIAL/PLATELET
Basophils Absolute: 0 10*3/uL (ref 0.0–0.1)
Basophils Relative: 0 %
Eosinophils Absolute: 0.2 10*3/uL (ref 0.0–0.7)
Eosinophils Relative: 3 %
HEMATOCRIT: 35.2 % — AB (ref 36.0–46.0)
Hemoglobin: 10.6 g/dL — ABNORMAL LOW (ref 12.0–15.0)
Lymphocytes Relative: 60 %
Lymphs Abs: 3.7 10*3/uL (ref 0.7–4.0)
MCH: 29.1 pg (ref 26.0–34.0)
MCHC: 30.1 g/dL (ref 30.0–36.0)
MCV: 96.7 fL (ref 78.0–100.0)
MONO ABS: 0.5 10*3/uL (ref 0.1–1.0)
Monocytes Relative: 8 %
NEUTROS ABS: 1.8 10*3/uL (ref 1.7–7.7)
Neutrophils Relative %: 29 %
Platelets: 100 10*3/uL — ABNORMAL LOW (ref 150–400)
RBC: 3.64 MIL/uL — ABNORMAL LOW (ref 3.87–5.11)
RDW: 16 % — AB (ref 11.5–15.5)
WBC: 6.2 10*3/uL (ref 4.0–10.5)

## 2017-11-02 LAB — LACTATE DEHYDROGENASE: LDH: 97 U/L — ABNORMAL LOW (ref 98–192)

## 2017-11-02 MED ORDER — SODIUM CHLORIDE 0.9% FLUSH
10.0000 mL | Freq: Once | INTRAVENOUS | Status: AC
  Administered 2017-11-02: 10 mL via INTRAVENOUS

## 2017-11-02 MED ORDER — HEPARIN SOD (PORK) LOCK FLUSH 100 UNIT/ML IV SOLN
500.0000 [IU] | Freq: Once | INTRAVENOUS | Status: AC
  Administered 2017-11-02: 500 [IU] via INTRAVENOUS

## 2017-11-02 NOTE — Patient Instructions (Signed)
[facility] at [facility]  Discharge Instructions:  Labs were drawn from your PICC line today.  _______________________________________________________________  Thank you for choosing [facility] at [facility] to provide your oncology and hematology care.  To afford each patient quality time with our providers, please arrive at least 15 minutes before your scheduled appointment.  You need to re-schedule your appointment if you arrive 10 or more minutes late.  We strive to give you quality time with our providers, and arriving late affects you and other patients whose appointments are after yours.  Also, if you no show three or more times for appointments you may be dismissed from the clinic.  Again, thank you for choosing [facility] hope is that these requests will allow you access to exceptional care and in a timely manner. _______________________________________________________________  If you have questions after your visit, please contact our office at [phone] between the hours of 8:30 a.m. and 5:00 p.m. Voicemails left after 4:30 p.m. will not be returned until the following business day. _______________________________________________________________  For prescription refill requests, have your pharmacy contact our office. _______________________________________________________________  Recommendations made by the consultant and any test results will be sent to your referring physician. _______________________________________________________________

## 2017-11-02 NOTE — Telephone Encounter (Signed)
Dr. Walden Field requested patient have another UA/UC. Called and spoke with patients nurse at [facility], [place]. Gave her the verbal order. She states she will take care of it.

## 2017-11-02 NOTE — Progress Notes (Signed)
Diagnosis Thrombocytopenia (HCC) - Plan: CBC with Differential/Platelet, Comprehensive metabolic panel, Lactate dehydrogenase, Ferritin, Hepatitis B surface antibody, Hepatitis B surface antigen, Hepatitis B core antibody, total, Hepatitis panel, acute, Hepatitis C RNA quantitative, Protime-INR, APTT, HIV antibody (with reflex), HIV antibody (with reflex), APTT, Protime-INR, Hepatitis C RNA quantitative, Hepatitis panel, acute, Hepatitis B core antibody, total, Hepatitis B surface antigen, Hepatitis B surface antibody, Ferritin, Lactate dehydrogenase, Comprehensive metabolic panel, CBC with Differential/Platelet, heparin lock flush 100 unit/mL, sodium chloride flush (NS) 0.9 % injection 10 mL  Anemia in other chronic diseases classified elsewhere - Plan: CBC with Differential/Platelet, Comprehensive metabolic panel, Lactate dehydrogenase, Ferritin, Hepatitis B surface antibody, Hepatitis B surface antigen, Hepatitis B core antibody, total, Hepatitis panel, acute, Hepatitis C RNA quantitative, Protime-INR, APTT, HIV antibody (with reflex), HIV antibody (with reflex), APTT, Protime-INR, Hepatitis C RNA quantitative, Hepatitis panel, acute, Hepatitis B core antibody, total, Hepatitis B surface antigen, Hepatitis B surface antibody, Ferritin, Lactate dehydrogenase, Comprehensive metabolic panel, CBC with Differential/Platelet, heparin lock flush 100 unit/mL, sodium chloride flush (NS) 0.9 % injection 10 mL  Staging Cancer Staging No matching staging information was found for the patient.  Assessment and Plan:  1.  Thrombocytopenia.  Labs done today 11/02/2017 reviewed and showed a white count of 6.2 hemoglobin 10.6 platelets 100,000.  She has a normal differential with mildly elevated lymphocyte count.  No fragmentation.  LDH is normal at 97.  Chemistries show K+ 3.2, Cr 1.05, normal LFTs.  PT 13.4 PTT 49.  Suspect thrombocytopenia may be due to recent drug resistant UTI.  Will ask for NH to repeat UA and  culture to determine  If infection has cleared after abx.  Plts adequate at 100,000.  Awaiting results of Hepatitis and HIV testing.  She will RTC to go over lab results.   2.  UTI.  Pt had UA done 09/2017 that showed drug resistant E coli.  She was treated with IV abx.  Will ask for nursing home to repeat UA and culture to determine if infection has cleared after abx.    3.  Kidney stones.  Pt should follow-up with urology as recommended.   4.  HTN. BP is 90/47.  Follow-up with PCP for management.  Pt asymptomatic today.    5.  CVA.  PT in wheelchair.  Follow-up with neurology or PCP as recommended.    Greater than 40 minutes spent with more than 50% spent in counseling and coordination of care.    HPI: 62 year old female who is a nursing home patient had [facility].  She is referred today for evaluation of thrombocytopenia.  She had a recent diagnosis of a E. coli that was drug-resistant in July 2019.  She had a PICC line placed and was treated with antibiotics by her PCP.  Family member is unaware if of urine culture was repeated.  Review of records show labs done 10/18/2017 which showed a white count of 5.5 hemoglobin 10.9 platelets 111,000.  Chemistries within normal limits with a creatinine of 0.72 sodium was 146.  Labs were repeated on 10/25/2017 and showed a white count 5.7 hemoglobin 11.9 platelets 90,000 she had normal differential.  Chemistries within normal limits with a creatinine of 0.68 she denies any new medications other than antibiotics that were used for recent UTI.  She reportedly has been diagnosed with kidney stones.  Denies any fevers at home.  She has noted no adenopathy.  Patient is seen today for consultation due to thrombocytopenia.   Problem List Patient Active  Problem List   Diagnosis Date Noted  . Pressure injury of skin [L89.90] 09/03/2017  . Calculus of kidney [N20.0] 09/02/2017  . Renal stone [N20.0] 07/12/2017  . Toxic encephalopathy [G92] 05/20/2017  .  Pyelonephritis [N12] 05/20/2017  . ESBL (extended spectrum beta-lactamase) producing bacteria infection [A49.9, Z16.12] 05/20/2017  . Renal calculus [N20.0] 05/17/2017  . Severe sepsis with septic shock ([place]) [A41.9, R65.21] 04/23/2017  . AKI (acute kidney injury) ([place]) [N17.9] 04/21/2017  . CVA (cerebral vascular accident) ([place]) [I63.9] 04/21/2017  . HTN (hypertension) [I10] 11/21/2012  . Lower urinary tract infectious disease [N39.0] 11/18/2012  . Sepsis ([place]) [A41.9] 11/18/2012  . Thrombocytopenia, unspecified ([place]) [D69.6] 07/28/2012  . Central sleep apnea [G47.31] 07/28/2012  . Hemiparesis affecting left side as late effect of cerebrovascular accident [facility]) [D22.025] 07/28/2012  . Hypothyroidism [E03.9] 07/28/2012  . Hypercapnia [R06.89] 07/28/2012  . Seizure disorder ([facility]) [K27.062] 07/28/2012  . Narcotic overdose ([place]) [T40.601A] 07/27/2012  . Acute respiratory failure ([place]) [J96.00] 07/27/2012  . Morbid obesity ([place]) [E66.01] 07/27/2012  . History of stroke [Z86.73] 07/27/2012  . Chronic pain disorder [G89.4] 07/27/2012    Past Medical History Past Medical History:  Diagnosis Date  . Acute kidney failure (HCC)    hx of   . Acute respiratory failure ([place]) 07/27/2012  . Arthritis    OSTEO LEFT LEG  . Bilateral cataracts   . Candidiasis of skin and nail   . Chronic pain   . Chronic pain syndrome   . Constipation   . Contractures involving both knees    and ankles   . CVA (cerebral vascular accident) ([place])    left sided hemiparesis  . Depression   . Diabetes mellitus without complication (HCC)    type II, DIET CONTROLLED  . Dry eye syndrome of bilateral lacrimal glands   . Dysphagia   . Feeding difficulties   . 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 GERD (gastroesophageal reflux disease)   . Hemiparesis affecting left side as late effect of cerebrovascular accident ([place])   . History of kidney stones   . Hyperlipidemia   . Hypertension   . Metabolic encephalopathy  37/62/8315  . Morbid obesity ([place])   . Muscle weakness (generalized)   . Narcotic overdose ([place]) 07/27/2012  . Parkinson's disease ([place])   . Peripheral vascular disease ([place])   . Polymyalgia rheumatica ([place])   . Primary insomnia   . Respiratory failure ([place]) 05/06/10  . Seizure disorder ([place])   . Sepsis ([place])    hx of   . Sleep apnea 04/2010   on CPAP, "severe central sleep apnea"  . Spasticity    chronic  . Thrombocytopenia ([place])    related to depakote  . Thrombocytopenia ([place])   . Tinea unguium   . Unspecified hypothyroidism   . 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[place])   . Urinary incontinence   . UTI (lower urinary tract infection) 05/06/10    Past Surgical History Past Surgical History:  Procedure Laterality Date  . CEREBRAL ANEURYSM REPAIR  1999  . CYSTOSCOPY W/ URETERAL STENT PLACEMENT Bilateral 04/22/2017   Procedure: CYSTOSCOPY WITH BILATERAL  RETROGRADE PYELOGRAM, RIGHT URETERAL STENT PLACEMENT;  Surgeon: Festus Aloe, MD;  Location: [facility];  Service: Urology;  Laterality: Bilateral;  . CYSTOSCOPY W/ URETERAL STENT REMOVAL Right 05/17/2017   Procedure: CYSTOSCOPY WITH STENT REMOVAL;  Surgeon: Franchot Gallo, MD;  Location: [facility];  Service: Urology;  Laterality: Right;  .  CYSTOSCOPY W/ URETERAL STENT REMOVAL Bilateral 07/12/2017   Procedure: CYSTOSCOPY WITH STENT EXTRACTION;  Surgeon: Franchot Gallo, MD;  Location: [facility];  Service: Urology;  Laterality: Bilateral;  . CYSTOSCOPY/URETEROSCOPY/HOLMIUM LASER/STENT PLACEMENT Right 05/17/2017   Procedure: CYSTOSCOPY/URETEROSCOPY/HOLMIUM LASER/STENT PLACEMENT/BILATERAL STENT PLACEMENTS ,RIGHT STONE EXTRACTION;  Surgeon: Franchot Gallo, MD;  Location: [facility];  Service: Urology;  Laterality: Right;  . CYSTOSCOPY/URETEROSCOPY/HOLMIUM LASER/STENT PLACEMENT Bilateral 07/12/2017   Procedure: CYSTOSCOPY/DIAGNOSTIC BILATERAL URETEROSCOPY/BILATERAL  STENT PLACEMENT;  Surgeon: Franchot Gallo, MD;  Location: [facility];  Service: Urology;  Laterality: Bilateral;  . CYSTOSCOPY/URETEROSCOPY/HOLMIUM LASER/STENT PLACEMENT Bilateral 09/02/2017   Procedure: CYSTOSCOPY/ URETEROSCOPY/ RETROGRADE PYELOGRAM/ STENT EXCHANGE/ STONE EXTRACTION;  Surgeon: Franchot Gallo, MD;  Location: [facility];  Service: Urology;  Laterality: Bilateral;  . HOLMIUM LASER APPLICATION Left 7/98/9211   Procedure: HOLMIUM LASER APPLICATION;  Surgeon: Franchot Gallo, MD;  Location: [facility];  Service: Urology;  Laterality: Left;  . INTRATHECAL PUMP IMPLANTATION  2010   [employer]:  fentanyl, baclofen changed to morphine and baclofen on 07/2012  . PICC LINE INSERTION     RIGHT ARM    Family History History reviewed. No pertinent family history.   Social History  reports that she has never smoked. She has never used smokeless tobacco. She reports that she does not drink alcohol or use drugs.  Medications  Current Outpatient Medications:  .  acetaminophen (TYLENOL) 325 MG tablet, Take 2 tablets (650 mg total) by mouth every 4 (four) hours as needed for mild pain (or temp > 37.5 C (99.5 F))., Disp: 30 tablet, Rfl: 2 .  AMINO ACIDS-PROTEIN HYDROLYS PO, Take 30 mLs by mouth 2 (two) times daily., Disp: , Rfl:  .  amLODipine (NORVASC) 10 MG tablet, Take 1 tablet (10 mg total) by mouth daily., Disp: 30 tablet, Rfl: 3 .  aspirin EC 81 MG tablet, Take 81 mg by mouth daily., Disp: , Rfl:  .  cloNIDine (CATAPRES) 0.1 MG tablet, Take 0.1 mg by mouth every 6 (six) hours as needed (for SBP greater than 160)., Disp: , Rfl:  .  divalproex (DEPAKOTE) 125 MG DR tablet, Take 125 mg by mouth 2 (two) times daily., Disp: , Rfl:  .  divalproex (DEPAKOTE) 500 MG DR tablet, Take 500 mg by mouth 2 (two) times daily., Disp: , Rfl:  .  DULoxetine (CYMBALTA) 20 MG capsule, Take 20 mg by mouth daily., Disp: , Rfl:  .  DULoxetine (CYMBALTA) 60 MG capsule, Take 60 mg by mouth daily., Disp: , Rfl:  .   fluconazole (DIFLUCAN) 100 MG tablet, Take 1 tablet (100 mg total) by mouth daily., Disp: 5 tablet, Rfl: 0 .  furosemide (LASIX) 20 MG tablet, Take 20 mg by mouth 2 (two) times daily., Disp: , Rfl:  .  heparin flush 10 UNIT/ML SOLN injection, Inject 50 Units into the vein 3 (three) times daily., Disp: , Rfl:  .  labetalol (NORMODYNE) 200 MG tablet, Take 200 mg by mouth 2 (two) times daily., Disp: , Rfl:  .  lactulose, encephalopathy, (CHRONULAC) 10 GM/15ML SOLN, Take 20 g by mouth daily. , Disp: , Rfl:  .  levETIRAcetam (KEPPRA) 500 MG tablet, Take 1,000-1,500 mg by mouth See admin instructions. Take 1000 mg by mouth in the morning and take 1500 mg by mouth at bedtime, Disp: , Rfl:  .  levothyroxine (SYNTHROID, LEVOTHROID) 100 MCG tablet, Take 100 mcg by mouth daily before breakfast., Disp: , Rfl:  .  lidocaine (LIDODERM) 5 %, Place 1 patch onto the skin daily. Remove &  Discard patch within 12 hours or as directed by MD, Disp: , Rfl:  .  Lidocaine-Glycerin (PREPARATION H) 5-14.4 % CREA, Place 1 application rectally every 4 (four) hours as needed (hemorrhoids)., Disp: , Rfl:  .  loratadine (CLARITIN) 10 MG tablet, Take 10 mg by mouth daily., Disp: , Rfl:  .  losartan (COZAAR) 50 MG tablet, Take 50 mg by mouth at bedtime., Disp: , Rfl:  .  Menthol, Topical Analgesic, 5 % GEL, Apply 1 application topically every 6 (six) hours as needed (for pain). , Disp: , Rfl:  .  Multiple Vitamins-Minerals (CERTAGEN PO), Take 1 tablet by mouth daily., Disp: , Rfl:  .  Olopatadine HCl (PATADAY) 0.2 % SOLN, Place 1 drop into both eyes at bedtime., Disp: , Rfl:  .  omeprazole (PRILOSEC) 20 MG capsule, Take 20 mg by mouth daily., Disp: , Rfl:  .  oxyCODONE (OXY IR/ROXICODONE) 5 MG immediate release tablet, Take 1 tablet (5 mg total) by mouth every 8 (eight) hours as needed. (Patient taking differently: Take 5 mg by mouth every 8 (eight) hours. ), Disp: 15 tablet, Rfl: 0 .  Polyethyl Glycol-Propyl Glycol (SYSTANE) 0.4-0.3  % SOLN, Place 1 drop into both eyes every 8 (eight) hours as needed (for dry eyes). , Disp: , Rfl:  .  polyethylene glycol (MIRALAX / GLYCOLAX) packet, Take 17 g by mouth daily., Disp: , Rfl:  .  rosuvastatin (CRESTOR) 5 MG tablet, Take 5 mg by mouth at bedtime., Disp: , Rfl:  .  Sodium Chloride Flush (NORMAL SALINE FLUSH) 0.9 % SOLN, Inject 10 mLs into the vein 3 (three) times daily. Every shift, Disp: , Rfl:  .  traZODone (DESYREL) 50 MG tablet, Take 50 mg by mouth at bedtime., Disp: , Rfl:  .  vitamin C (ASCORBIC ACID) 500 MG tablet, Take 500 mg by mouth daily., Disp: , Rfl:  .  zinc sulfate 220 (50 Zn) MG capsule, Take 220 mg by mouth daily., Disp: , Rfl:   Allergies Codeine; Lacosamide; Morphine; and Zolpidem  Review of Systems Review of Systems - Oncology ROS negative other than chronic feet swelling.    Physical Exam  Vitals Wt Readings from Last 3 Encounters:  09/02/17 258 lb (117 kg)  07/12/17 270 lb 15.1 oz (122.9 kg)  05/17/17 271 lb (122.9 kg)   Temp Readings from Last 3 Encounters:  09/05/17 99.3 F (37.4 C) (Oral)  07/13/17 97.7 F (36.5 C) (Oral)  05/21/17 98.9 F (37.2 C) (Oral)   BP Readings from Last 3 Encounters:  11/02/17 (!) 90/47  09/05/17 110/67  07/13/17 111/66   Pulse Readings from Last 3 Encounters:  11/02/17 73  09/05/17 84  07/13/17 74   Constitutional: Well-developed, well-nourished, and in no distress.   HENT: Head: Normocephalic and atraumatic.  Mouth/Throat: No oropharyngeal exudate. Mucosa moist. Eyes: Pupils are equal, round, and reactive to light. Conjunctivae are normal. No scleral icterus.  Neck: Normal range of motion. Neck supple. No JVD present.  Cardiovascular: Normal rate, regular rhythm and normal heart sounds.  Exam reveals no gallop and no friction rub.   No murmur heard. Pulmonary/Chest: Effort normal and breath sounds normal. No respiratory distress. No wheezes.No rales.  Abdominal: Soft. Bowel sounds are normal. No  distension. There is no tenderness. There is no guarding.  Musculoskeletal: bilateral LE edema 1+.   Lymphadenopathy: No cervical, axillary or supraclavicular adenopathy.  Neurological: Alert and oriented to person, place, and time. No cranial nerve deficit.  Skin: Skin is warm and  dry. No rash noted. No erythema. No pallor.  Psychiatric: Affect and judgment normal.   Labs Office Visit on 11/02/2017  Component Date Value Ref Range Status  . aPTT 11/02/2017 49* 24 - 36 seconds Final   Comment:        IF BASELINE aPTT IS ELEVATED, SUGGEST PATIENT RISK ASSESSMENT BE USED TO DETERMINE APPROPRIATE ANTICOAGULANT THERAPY. Performed at [facility], [address] [place], [place] [address]   . Prothrombin Time 11/02/2017 13.4  11.4 - 15.2 seconds Final  . INR 11/02/2017 1.03   Final   Performed at [facility], [address] [place], [place] [address]  . Ferritin 11/02/2017 79  11 - 307 ng/mL Final   Performed at [facility], [address], [place], [place] [address]  . LDH 11/02/2017 97* 98 - 192 U/L Final   Performed at [facility], [address], [place], [place] [address]  . Sodium 11/02/2017 142  135 - 145 mmol/L Final  . Potassium 11/02/2017 3.2* 3.5 - 5.1 mmol/L Final  . Chloride 11/02/2017 104  98 - 111 mmol/L Final  . CO2 11/02/2017 28  22 - 32 mmol/L Final  . Glucose, Bld 11/02/2017 153* 70 - 99 mg/dL Final  . BUN 11/02/2017 17  8 - 23 mg/dL Final  . Creatinine, Ser 11/02/2017 1.05* 0.44 - 1.00 mg/dL Final  . Calcium 11/02/2017 8.5* 8.9 - 10.3 mg/dL Final  . Total Protein 11/02/2017 6.3* 6.5 - 8.1 g/dL Final  . Albumin 11/02/2017 2.7* 3.5 - 5.0 g/dL Final  . AST 11/02/2017 15  15 - 41 U/L Final  . ALT 11/02/2017 9  0 - 44 U/L Final  . Alkaline Phosphatase 11/02/2017 71  38 - 126 U/L Final  . Total Bilirubin 11/02/2017 0.3  0.3 - 1.2 mg/dL Final  . GFR calc non Af Amer 11/02/2017 56* >60 mL/min Final  . GFR calc Af Amer 11/02/2017 >60  >60 mL/min Final   Comment:  (NOTE) The eGFR has been calculated using the CKD EPI equation. This calculation has not been validated in all clinical situations. eGFR's persistently <60 mL/min signify possible Chronic Kidney Disease.   Georgiann Hahn gap 11/02/2017 10  5 - 15 Final   Performed at [facility], [address], [place], [place] [address]  . WBC 11/02/2017 6.2  4.0 - 10.5 K/uL Final  . RBC 11/02/2017 3.64* 3.87 - 5.11 MIL/uL Final  . Hemoglobin 11/02/2017 10.6* 12.0 - 15.0 g/dL Final  . HCT 11/02/2017 35.2* 36.0 - 46.0 % Final  . MCV 11/02/2017 96.7  78.0 - 100.0 fL Final  . MCH 11/02/2017 29.1  26.0 - 34.0 pg Final  . MCHC 11/02/2017 30.1  30.0 - 36.0 g/dL Final  . RDW 11/02/2017 16.0* 11.5 - 15.5 % Final  . Platelets 11/02/2017 100* 150 - 400 K/uL Final   Comment: CONSISTENT WITH PREVIOUS RESULT SPECIMEN CHECKED FOR CLOTS   . Neutrophils Relative % 11/02/2017 29  % Final  . Neutro Abs 11/02/2017 1.8  1.7 - 7.7 K/uL Final  . Lymphocytes Relative 11/02/2017 60  % Final  . Lymphs Abs 11/02/2017 3.7  0.7 - 4.0 K/uL Final  . Monocytes Relative 11/02/2017 8  % Final  . Monocytes Absolute 11/02/2017 0.5  0.1 - 1.0 K/uL Final  . Eosinophils Relative 11/02/2017 3  % Final  . Eosinophils Absolute 11/02/2017 0.2  0.0 - 0.7 K/uL Final  . Basophils Relative 11/02/2017 0  % Final  . Basophils Absolute 11/02/2017 0.0  0.0 -  0.1 K/uL Final   Performed at [facility], [address], [place], [place] [address]     Pathology Orders Placed This Encounter  Procedures  . CBC with Differential/Platelet    Standing Status:   Future    Number of Occurrences:   1    Standing Expiration Date:   11/03/2018  . Comprehensive metabolic panel    Standing Status:   Future    Number of Occurrences:   1    Standing Expiration Date:   11/03/2018  . Lactate dehydrogenase    Standing Status:   Future    Number of Occurrences:   1    Standing Expiration Date:   11/03/2018  . Ferritin    Standing Status:   Future    Number of  Occurrences:   1    Standing Expiration Date:   11/03/2018  . Hepatitis B surface antibody    Standing Status:   Future    Number of Occurrences:   1    Standing Expiration Date:   11/03/2018  . Hepatitis B surface antigen    Standing Status:   Future    Number of Occurrences:   1    Standing Expiration Date:   11/03/2018  . Hepatitis B core antibody, total    Standing Status:   Future    Number of Occurrences:   1    Standing Expiration Date:   11/03/2018  . Hepatitis panel, acute    Standing Status:   Future    Number of Occurrences:   1    Standing Expiration Date:   11/03/2018  . Hepatitis C RNA quantitative    Standing Status:   Future    Number of Occurrences:   1    Standing Expiration Date:   11/03/2018  . Protime-INR    Standing Status:   Future    Number of Occurrences:   1    Standing Expiration Date:   11/03/2018  . APTT    Standing Status:   Future    Number of Occurrences:   1    Standing Expiration Date:   11/03/2018  . HIV antibody (with reflex)    Standing Status:   Future    Number of Occurrences:   1    Standing Expiration Date:   11/03/2018       Zoila Shutter MD

## 2017-11-02 NOTE — Progress Notes (Signed)
Lab work drawn from right PICC line.  Flushed easily with no complaints of pain.  Dressing clean and dry with no bruising or swelling noted at site. Family at side.  Discharge with no s/s of distress noted.  PICC line dressing and flushes managed by [facility].

## 2017-11-03 LAB — HEPATITIS PANEL, ACUTE
HCV Ab: <0.1 s/co ratio (ref 0.0–0.9)
Hep A IgM: NEGATIVE
Hep B C IgM: NEGATIVE
Hepatitis B Surface Ag: NEGATIVE

## 2017-11-03 LAB — HIV ANTIBODY (ROUTINE TESTING W REFLEX): HIV SCREEN 4TH GENERATION: NONREACTIVE

## 2017-11-03 LAB — HEPATITIS B SURFACE ANTIBODY,QUALITATIVE: HEP B S AB: NONREACTIVE

## 2017-11-03 LAB — HEPATITIS B SURFACE ANTIGEN: HEP B S AG: NEGATIVE

## 2017-11-03 LAB — HEPATITIS B CORE ANTIBODY, TOTAL: Hep B Core Total Ab: NEGATIVE

## 2017-11-16 ENCOUNTER — Ambulatory Visit (HOSPITAL_COMMUNITY): Payer: Medicare Other | Admitting: Internal Medicine

## 2017-11-16 ENCOUNTER — Encounter (HOSPITAL_COMMUNITY): Payer: Self-pay | Admitting: *Deleted

## 2017-11-16 NOTE — Progress Notes (Signed)
Spoke with pt's nurse Lerry Paterson at [facility] and advised that the pt did need to have the urine done per Dr. Walden Field order. Nurse Hill stated that she would put the order in and take care of it. Pt is coming tomorrow for follow up visit with Dr. Walden Field.

## 2017-11-17 ENCOUNTER — Inpatient Hospital Stay (HOSPITAL_COMMUNITY): Payer: Medicare Other | Attending: Internal Medicine | Admitting: Internal Medicine

## 2017-11-17 ENCOUNTER — Inpatient Hospital Stay (HOSPITAL_COMMUNITY): Payer: Medicare Other

## 2017-11-17 ENCOUNTER — Encounter (HOSPITAL_COMMUNITY): Payer: Self-pay | Admitting: Internal Medicine

## 2017-11-17 VITALS — BP 132/74 | HR 88 | Temp 98.8°F | Resp 16 | Wt 219.0 lb

## 2017-11-17 DIAGNOSIS — I10 Essential (primary) hypertension: Secondary | ICD-10-CM | POA: Insufficient documentation

## 2017-11-17 DIAGNOSIS — K5900 Constipation, unspecified: Secondary | ICD-10-CM | POA: Insufficient documentation

## 2017-11-17 DIAGNOSIS — I69354 Hemiplegia and hemiparesis following cerebral infarction affecting left non-dominant side: Secondary | ICD-10-CM | POA: Diagnosis not present

## 2017-11-17 DIAGNOSIS — D696 Thrombocytopenia, unspecified: Secondary | ICD-10-CM

## 2017-11-17 DIAGNOSIS — D72820 Lymphocytosis (symptomatic): Secondary | ICD-10-CM | POA: Diagnosis not present

## 2017-11-17 DIAGNOSIS — N390 Urinary tract infection, site not specified: Secondary | ICD-10-CM | POA: Insufficient documentation

## 2017-11-17 DIAGNOSIS — B9620 Unspecified Escherichia coli [E. coli] as the cause of diseases classified elsewhere: Secondary | ICD-10-CM | POA: Diagnosis not present

## 2017-11-17 DIAGNOSIS — N200 Calculus of kidney: Secondary | ICD-10-CM | POA: Diagnosis not present

## 2017-11-17 LAB — CBC WITH DIFFERENTIAL/PLATELET
Basophils Absolute: 0 10*3/uL (ref 0.0–0.1)
Basophils Relative: 0 %
EOS ABS: 0.1 10*3/uL (ref 0.0–0.7)
Eosinophils Relative: 2 %
HCT: 34.7 % — ABNORMAL LOW (ref 36.0–46.0)
Hemoglobin: 10.2 g/dL — ABNORMAL LOW (ref 12.0–15.0)
LYMPHS ABS: 3.2 10*3/uL (ref 0.7–4.0)
Lymphocytes Relative: 50 %
MCH: 28.6 pg (ref 26.0–34.0)
MCHC: 29.4 g/dL — ABNORMAL LOW (ref 30.0–36.0)
MCV: 97.2 fL (ref 78.0–100.0)
Monocytes Absolute: 0.5 10*3/uL (ref 0.1–1.0)
Monocytes Relative: 8 %
Neutro Abs: 2.6 10*3/uL (ref 1.7–7.7)
Neutrophils Relative %: 40 %
Platelets: 122 10*3/uL — ABNORMAL LOW (ref 150–400)
RBC: 3.57 MIL/uL — ABNORMAL LOW (ref 3.87–5.11)
RDW: 15.8 % — ABNORMAL HIGH (ref 11.5–15.5)
WBC: 6.4 10*3/uL (ref 4.0–10.5)

## 2017-11-17 LAB — COMPREHENSIVE METABOLIC PANEL
ALT: 8 U/L (ref 0–44)
AST: 14 U/L — ABNORMAL LOW (ref 15–41)
Albumin: 2.6 g/dL — ABNORMAL LOW (ref 3.5–5.0)
Alkaline Phosphatase: 54 U/L (ref 38–126)
Anion gap: 6 (ref 5–15)
BUN: 13 mg/dL (ref 8–23)
CHLORIDE: 106 mmol/L (ref 98–111)
CO2: 33 mmol/L — ABNORMAL HIGH (ref 22–32)
CREATININE: 0.72 mg/dL (ref 0.44–1.00)
Calcium: 8.5 mg/dL — ABNORMAL LOW (ref 8.9–10.3)
GFR calc Af Amer: >60 mL/min (ref >60)
Glucose, Bld: 117 mg/dL — ABNORMAL HIGH (ref 70–99)
Potassium: 4.2 mmol/L (ref 3.5–5.1)
Sodium: 145 mmol/L (ref 135–145)
Total Bilirubin: 0.3 mg/dL (ref 0.3–1.2)
Total Protein: 6.2 g/dL — ABNORMAL LOW (ref 6.5–8.1)

## 2017-11-17 LAB — LACTATE DEHYDROGENASE: LDH: 125 U/L (ref 98–192)

## 2017-11-17 NOTE — Progress Notes (Signed)
Diagnosis No diagnosis found.  Staging Cancer Staging No matching staging information was found for the patient.  Assessment and Plan:  1.  Thrombocytopenia.  Suspect thrombocytopenia may be due to recent drug resistant UTI.  Have requested NH to repeat UA and culture to determine if infection has cleared after abx.  Plts improved at 122,000.  Hepatitis and HIV testing is negative.    Labs done 11/17/2017 reviewed and showed WBC 6.4 HB 10.2 plts 122,000 she has 50% lymphocytes.  Chemistries WNL with K+ 4.2, Cr 0.72 and normal LFTs.  She will RTC in 2 months for repeat labs pending flow cytometry results.    2.  Lymphocytosis.   Will send flow cytometry for further evaluation.  WBC WNL at 6.4.    3.  UTI.  Pt had UA done 09/2017 that showed drug resistant E coli.  She was treated with IV abx.  Have requested nursing home to repeat UA and culture to determine if infection has cleared after abx.    4.  Kidney stones.  Pt should follow-up with urology as recommended.   5.  HTN.  BP is improved at 132/74.  Follow-up with PCP as directed.    6.  CVA.  PT in wheelchair.  Follow-up with neurology or PCP as recommended.    7.  Constipation.  Stool softeners per NH facility.    Interval history:  62 year old female who is a nursing home patient had [facility].  She is referred today for evaluation of thrombocytopenia.  She had a recent diagnosis of a E. coli that was drug-resistant in July 2019.  She had a PICC line placed and was treated with antibiotics by her PCP.  Family member is unaware if of urine culture was repeated.  Review of records show labs done 10/18/2017 which showed a white count of 5.5 hemoglobin 10.9 platelets 111,000.  Chemistries within normal limits with a creatinine of 0.72 sodium was 146.  Labs were repeated on 10/25/2017 and showed a white count 5.7 hemoglobin 11.9 platelets 90,000 she had normal differential.  Chemistries within normal limits with a creatinine of 0.68 she  denies any new medications other than antibiotics that were used for recent UTI.  She reportedly has been diagnosed with kidney stones.  Denies any fevers at home.  She has noted no adenopathy.   Current Status:  Pt is seen today for follow-up to go over labs.  Pt is unaccompanied by family members.    Problem List Patient Active Problem List   Diagnosis Date Noted  . Pressure injury of skin [L89.90] 09/03/2017  . Calculus of kidney [N20.0] 09/02/2017  . Renal stone [N20.0] 07/12/2017  . Toxic encephalopathy [G92] 05/20/2017  . Pyelonephritis [N12] 05/20/2017  . ESBL (extended spectrum beta-lactamase) producing bacteria infection [A49.9, Z16.12] 05/20/2017  . Renal calculus [N20.0] 05/17/2017  . Severe sepsis with septic shock ([place]) [A41.9, R65.21] 04/23/2017  . AKI (acute kidney injury) ([place]) [N17.9] 04/21/2017  . CVA (cerebral vascular accident) ([place]) [I63.9] 04/21/2017  . HTN (hypertension) [I10] 11/21/2012  . Lower urinary tract infectious disease [N39.0] 11/18/2012  . Sepsis ([place]) [A41.9] 11/18/2012  . Thrombocytopenia, unspecified ([place]) [D69.6] 07/28/2012  . Central sleep apnea [G47.31] 07/28/2012  . Hemiparesis affecting left side as late effect of cerebrovascular accident [facility]) [N82.956] 07/28/2012  . Hypothyroidism [E03.9] 07/28/2012  . Hypercapnia [R06.89] 07/28/2012  . Seizure disorder ([place]) [O13.086] 07/28/2012  . Narcotic overdose ([place]) [T40.601A] 07/27/2012  . Acute respiratory failure ([place]) [J96.00] 07/27/2012  . Morbid  obesity ([place]) [E66.01] 07/27/2012  . History of stroke [Z86.73] 07/27/2012  . Chronic pain disorder [G89.4] 07/27/2012    Past Medical History Past Medical History:  Diagnosis Date  . Acute kidney failure (HCC)    hx of   . Acute respiratory failure ([place]) 07/27/2012  . Arthritis    OSTEO LEFT LEG  . Bilateral cataracts   . Candidiasis of skin and nail   . Chronic pain   . Chronic pain syndrome   . Constipation   . Contractures involving both  knees    and ankles   . CVA (cerebral vascular accident) ([place])    left sided hemiparesis  . Depression   . Diabetes mellitus without complication (HCC)    type II, DIET CONTROLLED  . Dry eye syndrome of bilateral lacrimal glands   . Dysphagia   . Feeding difficulties   . 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 GERD (gastroesophageal reflux disease)   . Hemiparesis affecting left side as late effect of cerebrovascular accident ([place])   . History of kidney stones   . Hyperlipidemia   . Hypertension   . Metabolic encephalopathy 90/24/0973  . Morbid obesity ([place])   . Muscle weakness (generalized)   . Narcotic overdose ([place]) 07/27/2012  . Parkinson's disease ([place])   . Peripheral vascular disease ([place])   . Polymyalgia rheumatica ([place])   . Primary insomnia   . Respiratory failure ([place]) 05/06/10  . Seizure disorder ([place])   . Sepsis ([place])    hx of   . Sleep apnea 04/2010   on CPAP, "severe central sleep apnea"  . Spasticity    chronic  . Thrombocytopenia ([place])    related to depakote  . Thrombocytopenia ([place])   . Tinea unguium   . Unspecified hypothyroidism   . 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[place])   . Urinary incontinence   . UTI (lower urinary tract infection) 05/06/10    Past Surgical History Past Surgical History:  Procedure Laterality Date  . CEREBRAL ANEURYSM REPAIR  1999  . CYSTOSCOPY W/ URETERAL STENT PLACEMENT Bilateral 04/22/2017   Procedure: CYSTOSCOPY WITH BILATERAL  RETROGRADE PYELOGRAM, RIGHT URETERAL STENT PLACEMENT;  Surgeon: Festus Aloe, MD;  Location: [facility];  Service: Urology;  Laterality: Bilateral;  . CYSTOSCOPY W/ URETERAL STENT REMOVAL Right 05/17/2017   Procedure: CYSTOSCOPY WITH STENT REMOVAL;  Surgeon: Franchot Gallo, MD;  Location: [facility];  Service: Urology;  Laterality: Right;  . CYSTOSCOPY W/ URETERAL STENT REMOVAL Bilateral 07/12/2017   Procedure: CYSTOSCOPY WITH STENT  EXTRACTION;  Surgeon: Franchot Gallo, MD;  Location: [facility];  Service: Urology;  Laterality: Bilateral;  . CYSTOSCOPY/URETEROSCOPY/HOLMIUM LASER/STENT PLACEMENT Right 05/17/2017   Procedure: CYSTOSCOPY/URETEROSCOPY/HOLMIUM LASER/STENT PLACEMENT/BILATERAL STENT PLACEMENTS ,RIGHT STONE EXTRACTION;  Surgeon: Franchot Gallo, MD;  Location: [facility];  Service: Urology;  Laterality: Right;  . CYSTOSCOPY/URETEROSCOPY/HOLMIUM LASER/STENT PLACEMENT Bilateral 07/12/2017   Procedure: CYSTOSCOPY/DIAGNOSTIC BILATERAL URETEROSCOPY/BILATERAL STENT PLACEMENT;  Surgeon: Franchot Gallo, MD;  Location: [facility];  Service: Urology;  Laterality: Bilateral;  . CYSTOSCOPY/URETEROSCOPY/HOLMIUM LASER/STENT PLACEMENT Bilateral 09/02/2017   Procedure: CYSTOSCOPY/ URETEROSCOPY/ RETROGRADE PYELOGRAM/ STENT EXCHANGE/ STONE EXTRACTION;  Surgeon: Franchot Gallo, MD;  Location: [facility];  Service: Urology;  Laterality: Bilateral;  . HOLMIUM LASER APPLICATION Left 5/32/9924   Procedure: HOLMIUM LASER APPLICATION;  Surgeon: Franchot Gallo, MD;  Location: [facility];  Service: Urology;  Laterality: Left;  . INTRATHECAL PUMP IMPLANTATION  2010   [employer]:  fentanyl, baclofen changed to morphine and baclofen  on 07/2012  . PICC LINE INSERTION     RIGHT ARM    Family History History reviewed. No pertinent family history.   Social History  reports that she has never smoked. She has never used smokeless tobacco. She reports that she does not drink alcohol or use drugs.  Medications  Current Outpatient Medications:  .  acetaminophen (TYLENOL) 325 MG tablet, Take 2 tablets (650 mg total) by mouth every 4 (four) hours as needed for mild pain (or temp > 37.5 C (99.5 F))., Disp: 30 tablet, Rfl: 2 .  AMINO ACIDS-PROTEIN HYDROLYS PO, Take 30 mLs by mouth 2 (two) times daily., Disp: , Rfl:  .  amLODipine (NORVASC) 10 MG tablet, Take 1 tablet (10 mg total) by mouth daily., Disp: 30 tablet, Rfl: 3 .  aspirin EC 81 MG tablet, Take 81  mg by mouth daily., Disp: , Rfl:  .  cloNIDine (CATAPRES) 0.1 MG tablet, Take 0.1 mg by mouth every 6 (six) hours as needed (for SBP greater than 160)., Disp: , Rfl:  .  divalproex (DEPAKOTE) 125 MG DR tablet, Take 125 mg by mouth 2 (two) times daily., Disp: , Rfl:  .  divalproex (DEPAKOTE) 500 MG DR tablet, Take 500 mg by mouth 2 (two) times daily., Disp: , Rfl:  .  DULoxetine (CYMBALTA) 20 MG capsule, Take 20 mg by mouth daily., Disp: , Rfl:  .  DULoxetine (CYMBALTA) 60 MG capsule, Take 60 mg by mouth daily., Disp: , Rfl:  .  furosemide (LASIX) 20 MG tablet, Take 20 mg by mouth 2 (two) times daily., Disp: , Rfl:  .  heparin flush 10 UNIT/ML SOLN injection, Inject 50 Units into the vein 3 (three) times daily., Disp: , Rfl:  .  labetalol (NORMODYNE) 200 MG tablet, Take 200 mg by mouth 2 (two) times daily., Disp: , Rfl:  .  lactulose, encephalopathy, (CHRONULAC) 10 GM/15ML SOLN, Take 20 g by mouth daily. , Disp: , Rfl:  .  levETIRAcetam (KEPPRA) 500 MG tablet, Take 1,000-1,500 mg by mouth See admin instructions. Take 1000 mg by mouth in the morning and take 1500 mg by mouth at bedtime, Disp: , Rfl:  .  levothyroxine (SYNTHROID, LEVOTHROID) 100 MCG tablet, Take 100 mcg by mouth daily before breakfast., Disp: , Rfl:  .  lidocaine (LIDODERM) 5 %, Place 1 patch onto the skin daily. Remove & Discard patch within 12 hours or as directed by MD, Disp: , Rfl:  .  Lidocaine-Glycerin (PREPARATION H) 5-14.4 % CREA, Place 1 application rectally every 4 (four) hours as needed (hemorrhoids)., Disp: , Rfl:  .  linaclotide (LINZESS) 145 MCG CAPS capsule, Take 145 mcg by mouth daily before breakfast., Disp: , Rfl:  .  loratadine (CLARITIN) 10 MG tablet, Take 10 mg by mouth daily., Disp: , Rfl:  .  losartan (COZAAR) 50 MG tablet, Take 50 mg by mouth at bedtime., Disp: , Rfl:  .  Menthol, Topical Analgesic, 5 % GEL, Apply 1 application topically every 6 (six) hours as needed (for pain). , Disp: , Rfl:  .  Multiple  Vitamins-Minerals (CERTAGEN PO), Take 1 tablet by mouth daily., Disp: , Rfl:  .  Olopatadine HCl (PATADAY) 0.2 % SOLN, Place 1 drop into both eyes at bedtime., Disp: , Rfl:  .  omeprazole (PRILOSEC) 20 MG capsule, Take 20 mg by mouth daily., Disp: , Rfl:  .  ondansetron (ZOFRAN) 4 MG tablet, Take 4 mg by mouth every 8 (eight) hours as needed for nausea or vomiting.,  Disp: , Rfl:  .  oxyCODONE (OXY IR/ROXICODONE) 5 MG immediate release tablet, Take 1 tablet (5 mg total) by mouth every 8 (eight) hours as needed. (Patient taking differently: Take 5 mg by mouth every 8 (eight) hours. ), Disp: 15 tablet, Rfl: 0 .  Polyethyl Glycol-Propyl Glycol (SYSTANE) 0.4-0.3 % SOLN, Place 1 drop into both eyes every 8 (eight) hours as needed (for dry eyes). , Disp: , Rfl:  .  polyethylene glycol (MIRALAX / GLYCOLAX) packet, Take 17 g by mouth daily., Disp: , Rfl:  .  rosuvastatin (CRESTOR) 5 MG tablet, Take 5 mg by mouth at bedtime., Disp: , Rfl:  .  Sodium Chloride Flush (NORMAL SALINE FLUSH) 0.9 % SOLN, Inject 10 mLs into the vein 3 (three) times daily. Every shift, Disp: , Rfl:  .  traZODone (DESYREL) 50 MG tablet, Take 50 mg by mouth at bedtime., Disp: , Rfl:  .  vitamin C (ASCORBIC ACID) 500 MG tablet, Take 500 mg by mouth daily., Disp: , Rfl:  .  zinc sulfate 220 (50 Zn) MG capsule, Take 220 mg by mouth daily., Disp: , Rfl:  .  fluconazole (DIFLUCAN) 100 MG tablet, Take 1 tablet (100 mg total) by mouth daily. (Patient not taking: Reported on 11/17/2017), Disp: 5 tablet, Rfl: 0  Allergies Codeine; Lacosamide; Morphine; and Zolpidem  Review of Systems Review of Systems - Oncology ROS negative other than constipation.     Physical Exam  Vitals Wt Readings from Last 3 Encounters:  11/17/17 219 lb (99.3 kg)  09/02/17 258 lb (117 kg)  07/12/17 270 lb 15.1 oz (122.9 kg)   Temp Readings from Last 3 Encounters:  11/17/17 98.8 F (37.1 C) (Oral)  09/05/17 99.3 F (37.4 C) (Oral)  07/13/17 97.7 F (36.5  C) (Oral)   BP Readings from Last 3 Encounters:  11/17/17 132/74  11/02/17 (!) 90/47  09/05/17 110/67   Pulse Readings from Last 3 Encounters:  11/17/17 88  11/02/17 73  09/05/17 84   Constitutional: Well-developed, well-nourished, and in no distress.   HENT: Head: Normocephalic and atraumatic.  Mouth/Throat: No oropharyngeal exudate. Mucosa moist. Eyes: Pupils are equal, round, and reactive to light. Conjunctivae are normal. No scleral icterus.  Neck: Normal range of motion. Neck supple. No JVD present.  Cardiovascular: Normal rate, regular rhythm and normal heart sounds.  Exam reveals no gallop and no friction rub.   No murmur heard. Pulmonary/Chest: Effort normal and breath sounds normal. No respiratory distress. No wheezes.No rales.  Abdominal: Soft. Bowel sounds are normal. No distension. There is no tenderness. There is no guarding.  Musculoskeletal: No edema or tenderness.  Lymphadenopathy: No cervical, axillary or supraclavicular adenopathy.  Neurological: Alert and oriented to person, place, and time. No cranial nerve deficit.  Skin: Skin is warm and dry. No rash noted. No erythema. No pallor.  Psychiatric: Affect and judgment normal.   Labs Lab on 11/17/2017  Component Date Value Ref Range Status  . LDH 11/17/2017 125  98 - 192 U/L Final   Performed at [facility], [address], [place], [place] [address]  . Sodium 11/17/2017 145  135 - 145 mmol/L Final  . Potassium 11/17/2017 4.2  3.5 - 5.1 mmol/L Final  . Chloride 11/17/2017 106  98 - 111 mmol/L Final  . CO2 11/17/2017 33* 22 - 32 mmol/L Final  . Glucose, Bld 11/17/2017 117* 70 - 99 mg/dL Final  . BUN 11/17/2017 13  8 - 23 mg/dL Final  . Creatinine, Ser 11/17/2017 0.72  0.44 - 1.00 mg/dL Final  . Calcium 11/17/2017 8.5* 8.9 - 10.3 mg/dL Final  . Total Protein 11/17/2017 6.2* 6.5 - 8.1 g/dL Final  . Albumin 11/17/2017 2.6* 3.5 - 5.0 g/dL Final  . AST 11/17/2017 14* 15 - 41 U/L Final  . ALT 11/17/2017 8  0 - 44  U/L Final  . Alkaline Phosphatase 11/17/2017 54  38 - 126 U/L Final  . Total Bilirubin 11/17/2017 0.3  0.3 - 1.2 mg/dL Final  . GFR calc non Af Amer 11/17/2017 >60  >60 mL/min Final  . GFR calc Af Amer 11/17/2017 >60  >60 mL/min Final   Comment: (NOTE) The eGFR has been calculated using the CKD EPI equation. This calculation has not been validated in all clinical situations. eGFR's persistently <60 mL/min signify possible Chronic Kidney Disease.   Georgiann Hahn gap 11/17/2017 6  5 - 15 Final   Performed at [facility], [address] [place], [place] [address]  . WBC 11/17/2017 6.4  4.0 - 10.5 K/uL Final  . RBC 11/17/2017 3.57* 3.87 - 5.11 MIL/uL Final  . Hemoglobin 11/17/2017 10.2* 12.0 - 15.0 g/dL Final  . HCT 11/17/2017 34.7* 36.0 - 46.0 % Final  . MCV 11/17/2017 97.2  78.0 - 100.0 fL Final  . MCH 11/17/2017 28.6  26.0 - 34.0 pg Final  . MCHC 11/17/2017 29.4* 30.0 - 36.0 g/dL Final  . RDW 11/17/2017 15.8* 11.5 - 15.5 % Final  . Platelets 11/17/2017 122* 150 - 400 K/uL Final  . Neutrophils Relative % 11/17/2017 40  % Final  . Neutro Abs 11/17/2017 2.6  1.7 - 7.7 K/uL Final  . Lymphocytes Relative 11/17/2017 50  % Final  . Lymphs Abs 11/17/2017 3.2  0.7 - 4.0 K/uL Final  . Monocytes Relative 11/17/2017 8  % Final  . Monocytes Absolute 11/17/2017 0.5  0.1 - 1.0 K/uL Final  . Eosinophils Relative 11/17/2017 2  % Final  . Eosinophils Absolute 11/17/2017 0.1  0.0 - 0.7 K/uL Final  . Basophils Relative 11/17/2017 0  % Final  . Basophils Absolute 11/17/2017 0.0  0.0 - 0.1 K/uL Final   Performed at [facility], [address] [place], [place] [address]     Pathology No orders of the defined types were placed in this encounter.      Zoila Shutter MD

## 2017-11-19 LAB — HCV RNA QUANT: HCV Quantitative: NOT DETECTED IU/mL (ref >50)

## 2018-01-04 ENCOUNTER — Ambulatory Visit (INDEPENDENT_AMBULATORY_CARE_PROVIDER_SITE_OTHER): Payer: Medicare Other | Admitting: Gastroenterology

## 2018-01-04 ENCOUNTER — Ambulatory Visit: Payer: Medicare Other | Admitting: Gastroenterology

## 2018-01-04 ENCOUNTER — Encounter: Payer: Self-pay | Admitting: Gastroenterology

## 2018-01-04 VITALS — BP 108/73 | HR 71 | Temp 98.9°F | Ht 60.0 in

## 2018-01-04 DIAGNOSIS — R112 Nausea with vomiting, unspecified: Secondary | ICD-10-CM | POA: Diagnosis not present

## 2018-01-04 DIAGNOSIS — K5900 Constipation, unspecified: Secondary | ICD-10-CM

## 2018-01-04 MED ORDER — LUBIPROSTONE 24 MCG PO CAPS: 24.0000 ug | ORAL_CAPSULE | Freq: Two times a day (BID) | ORAL | 3 refills | Status: DC

## 2018-01-04 NOTE — Assessment & Plan Note (Signed)
62 year old female with intermittent N/V, unprovoked, occurring once weekly. No other alarm symptoms, reporting good appetite, denying dysphagia, no abdominal pain. Chronic GERD managed with Prilosec 40 mg daily. Query multifactorial in setting of chronic opioids, ?possible delayed gastric emptying, uncontrolled GERD, does not seem to have stricture. No prior EGD. Discussed more aggressive management of constipation, continue GERD management, and pursue EGD to ensure no occult etiology.   Proceed with upper endoscopy +/ - dilation in the near future with Dr. Gala Romney. The risks, benefits, and alternatives have been discussed in detail with patient. They have stated understanding and desire to proceed.  Propofol due to polypharmacy Return thereafter

## 2018-01-04 NOTE — Patient Instructions (Signed)
We have arranged an upper endoscopy with possible dilation if needed with Dr. Gala Romney.  Continue Prilosec once each day, 30 minutes before breakfast.  I would like for you to stop Linzess. Start Amitiza 1 gelcap twice a day WITH FOOD to avoid nausea.  I will see you in 2 months.  Please let us know if Amitiza is not helpful or you have nausea. We can always go back to Linzess at an increased dose.  It was a pleasure to see you today. I strive to create trusting relationships with patients to provide genuine, compassionate, and quality care. I value your feedback. If you receive a survey regarding your visit,  I greatly appreciate you taking time to fill this out.   Annitta Needs, PhD, ANP-BC [facility]

## 2018-01-04 NOTE — Assessment & Plan Note (Signed)
Currently not ideally managed. Med regimen includes Linzess 145 mcg once daily, Miralax TID, Senna one tablet at bedtime. 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 If no improvement, may need to trial Movantik. Return in 2 months.

## 2018-01-04 NOTE — Progress Notes (Signed)
Primary Care Physician:  Hilbert Corrigan, MD Primary Gastroenterologist:  Dr. Gala Romney   Chief Complaint  Patient presents with  . Nausea    w/ vomiting. Does not happen daily. last episode was last thursday    HPI:   [name] (prefers to be called "[name]") is a very pleasant  62 y.o. female presenting today at the request of Dr. Mal Amabile at [facility] due to intermittent N/V. She has no history of endoscopy/colonoscopy. Her sister, [name], is present with her.   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 She is unable to walk and requires lifting assistance to wheelchair. She is here today on a stretcher. Notes intermittent N/V onset around time of kidney stones, which have required stents and multiple procedures. N/V about once per week. No exacerbating factors. Has a good appetite. No dysphagia. No abdominal pain. Only notes abdominal discomfort after "heaving" with vomiting. No overt GI bleeding. On Prilosec once daily. Feels this controls GERD symptoms.   BM once per week. Med regimen includes Linzess 145 mcg once daily, Miralax TID, Senna one tablet at bedtime.   Past Medical History:  Diagnosis Date  . Acute kidney failure (HCC)    hx of   . Acute respiratory failure ([place]) 07/27/2012  . Arthritis    OSTEO LEFT LEG  . Bilateral cataracts   . Candidiasis of skin and nail   . Chronic pain   . Chronic pain syndrome   . Constipation   . Contractures involving both knees    and ankles   . CVA (cerebral vascular accident) ([place])    left sided hemiparesis  . Depression   . Diabetes mellitus without complication (HCC)    type II, DIET CONTROLLED  . Dry eye syndrome of bilateral lacrimal glands   . Dysphagia   . Feeding difficulties   . 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 GERD (gastroesophageal reflux disease)   . Hemiparesis affecting left side as late effect of cerebrovascular accident ([place])   . History of  kidney stones   . Hyperlipidemia   . Hypertension   . Metabolic encephalopathy 88/50/2774  . Morbid obesity ([place])   . Muscle weakness (generalized)   . Narcotic overdose ([place]) 07/27/2012  . Parkinson's disease ([place])   . Peripheral vascular disease ([place])   . Polymyalgia rheumatica ([place])   . Primary insomnia   . Respiratory failure ([place]) 05/06/10  . Seizure disorder ([place])   . Sepsis ([place])    hx of   . Sleep apnea 04/2010   on CPAP, "severe central sleep apnea"  . Spasticity    chronic  . Thrombocytopenia ([place])    related to depakote  . Thrombocytopenia ([place])   . Tinea unguium   . Unspecified hypothyroidism   . 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 Urinary incontinence   . UTI (lower urinary tract infection) 05/06/10    Past Surgical History:  Procedure Laterality Date  . CEREBRAL ANEURYSM REPAIR  1999  . CYSTOSCOPY W/ URETERAL STENT PLACEMENT Bilateral 04/22/2017   Procedure: CYSTOSCOPY WITH BILATERAL  RETROGRADE PYELOGRAM, RIGHT URETERAL STENT PLACEMENT;  Surgeon: Festus Aloe, MD;  Location: [facility];  Service: Urology;  Laterality: Bilateral;  . CYSTOSCOPY W/ URETERAL STENT REMOVAL Right 05/17/2017   Procedure: CYSTOSCOPY WITH STENT REMOVAL;  Surgeon: Franchot Gallo, MD;  Location: [facility];  Service: Urology;  Laterality: Right;  . CYSTOSCOPY W/ URETERAL STENT REMOVAL Bilateral 07/12/2017   Procedure: CYSTOSCOPY WITH STENT EXTRACTION;  Surgeon: Franchot Gallo, MD;  Location: [facility];  Service: Urology;  Laterality: Bilateral;  . CYSTOSCOPY/URETEROSCOPY/HOLMIUM LASER/STENT PLACEMENT Right 05/17/2017   Procedure: CYSTOSCOPY/URETEROSCOPY/HOLMIUM LASER/STENT PLACEMENT/BILATERAL STENT PLACEMENTS ,RIGHT STONE EXTRACTION;  Surgeon: Franchot Gallo, MD;  Location: [facility];  Service: Urology;  Laterality: Right;  . CYSTOSCOPY/URETEROSCOPY/HOLMIUM LASER/STENT PLACEMENT Bilateral 07/12/2017   Procedure:  CYSTOSCOPY/DIAGNOSTIC BILATERAL URETEROSCOPY/BILATERAL STENT PLACEMENT;  Surgeon: Franchot Gallo, MD;  Location: [facility];  Service: Urology;  Laterality: Bilateral;  . CYSTOSCOPY/URETEROSCOPY/HOLMIUM LASER/STENT PLACEMENT Bilateral 09/02/2017   Procedure: CYSTOSCOPY/ URETEROSCOPY/ RETROGRADE PYELOGRAM/ STENT EXCHANGE/ STONE EXTRACTION;  Surgeon: Franchot Gallo, MD;  Location: [facility];  Service: Urology;  Laterality: Bilateral;  . HOLMIUM LASER APPLICATION Left 2/87/6811   Procedure: HOLMIUM LASER APPLICATION;  Surgeon: Franchot Gallo, MD;  Location: [facility];  Service: Urology;  Laterality: Left;  . INTRATHECAL PUMP IMPLANTATION  2010   [employer]:  fentanyl, baclofen changed to morphine and baclofen on 07/2012  . PICC LINE INSERTION     RIGHT ARM    Current Outpatient Medications  Medication Sig Dispense Refill  . acetaminophen (TYLENOL) 325 MG tablet Take 2 tablets (650 mg total) by mouth every 4 (four) hours as needed for mild pain (or temp > 37.5 C (99.5 F)). 30 tablet 2  . AMINO ACIDS-PROTEIN HYDROLYS PO Take 30 mLs by mouth daily.     Marland Kitchen amLODipine (NORVASC) 10 MG tablet Take 1 tablet (10 mg total) by mouth daily. 30 tablet 3  . aspirin EC 81 MG tablet Take 81 mg by mouth daily.    . cloNIDine (CATAPRES) 0.1 MG tablet Take 0.1 mg by mouth every 6 (six) hours as needed (for SBP greater than 160).    Marland Kitchen divalproex (DEPAKOTE) 125 MG DR tablet Take 125 mg by mouth 2 (two) times daily.    . divalproex (DEPAKOTE) 500 MG DR tablet Take 500 mg by mouth 2 (two) times daily.    . DULoxetine (CYMBALTA) 20 MG capsule Take 20 mg by mouth daily.    . DULoxetine (CYMBALTA) 60 MG capsule Take 60 mg by mouth daily.    . furosemide (LASIX) 20 MG tablet Take 20 mg by mouth daily.     Marland Kitchen labetalol (NORMODYNE) 200 MG tablet Take 200 mg by mouth 2 (two) times daily.    Marland Kitchen lactulose, encephalopathy, (CHRONULAC) 10 GM/15ML SOLN Take 20 g by mouth daily.     Marland Kitchen levETIRAcetam (KEPPRA) 500 MG tablet Take  1,000-1,500 mg by mouth See admin instructions. Take 1000 mg by mouth in the morning and take 1500 mg by mouth at bedtime    . levothyroxine (SYNTHROID, LEVOTHROID) 125 MCG tablet Take 125 mcg by mouth daily before breakfast.    . lidocaine (LIDODERM) 5 % Place 1 patch onto the skin daily. Remove & Discard patch within 12 hours or as directed by MD    . Lidocaine-Glycerin (PREPARATION H) 5-14.4 % CREA Place 1 application rectally every 4 (four) hours as needed (hemorrhoids).    . linaclotide (LINZESS) 145 MCG CAPS capsule Take 145 mcg by mouth daily before breakfast.    . loratadine (CLARITIN) 10 MG tablet Take 10 mg by mouth daily.    Marland Kitchen losartan (COZAAR) 50 MG tablet Take 50 mg by mouth at bedtime.    . Menthol, Topical Analgesic, 5 % GEL Apply 1 application topically every 6 (six) hours as needed (for pain).     Marland Kitchen  Multiple Vitamins-Minerals (CERTAGEN PO) Take 1 tablet by mouth daily.    . Olopatadine HCl (PATADAY) 0.2 % SOLN Place 1 drop into both eyes at bedtime.    Marland Kitchen omeprazole (PRILOSEC) 20 MG capsule Take 40 mg by mouth daily.     . ondansetron (ZOFRAN) 4 MG tablet Take 4 mg by mouth every 8 (eight) hours as needed for nausea or vomiting.    Marland Kitchen oxyCODONE (OXY IR/ROXICODONE) 5 MG immediate release tablet Take 1 tablet (5 mg total) by mouth every 8 (eight) hours as needed. 15 tablet 0  . Polyethyl Glycol-Propyl Glycol (SYSTANE) 0.4-0.3 % SOLN Place 1 drop into both eyes every 8 (eight) hours as needed (for dry eyes).     . polyethylene glycol (MIRALAX / GLYCOLAX) packet Take 17 g by mouth 3 (three) times daily.     . rosuvastatin (CRESTOR) 5 MG tablet Take 5 mg by mouth at bedtime.    . saccharomyces boulardii (FLORASTOR) 250 MG capsule Take 250 mg by mouth 2 (two) times daily.    . sennosides-docusate sodium (SENOKOT-S) 8.6-50 MG tablet Take 1 tablet by mouth at bedtime.    . traZODone (DESYREL) 50 MG tablet Take 50 mg by mouth at bedtime.    . vitamin C (ASCORBIC ACID) 500 MG tablet Take 500  mg by mouth daily.    Marland Kitchen zinc sulfate 220 (50 Zn) MG capsule Take 220 mg by mouth daily.    Marland Kitchen lubiprostone (AMITIZA) 24 MCG capsule Take 1 capsule (24 mcg total) by mouth 2 (two) times daily with a meal. 60 capsule 3   No current facility-administered medications for this visit.     Allergies as of 01/04/2018 - Review Complete 01/04/2018  Allergen Reaction Noted  . Codeine Other (See Comments) 03/05/2011  . Lacosamide Other (See Comments) 01/17/2015  . Morphine Other (See Comments) 02/06/2015  . Zolpidem Other (See Comments) 02/06/2015    Family History  Problem Relation Age of Onset  . Colon cancer Neg Hx   . Colon polyps Neg Hx     Social History   Socioeconomic History  . Marital status: Married    Spouse name: Not on file  . Number of children: Not on file  . Years of education: Not on file  . Highest education level: Not on file  Occupational History  . Not on file  Social Needs  . Financial resource strain: Not on file  . Food insecurity:    Worry: Not on file    Inability: Not on file  . Transportation needs:    Medical: Not on file    Non-medical: Not on file  Tobacco Use  . Smoking status: Never Smoker  . Smokeless tobacco: Never Used  Substance and Sexual Activity  . Alcohol use: No  . Drug use: No  . Sexual activity: Not Currently  Lifestyle  . Physical activity:    Days per week: Not on file    Minutes per session: Not on file  . Stress: Not on file  Relationships  . Social connections:    Talks on phone: Not on file    Gets together: Not on file    Attends religious service: Not on file    Active member of club or organization: Not on file    Attends meetings of clubs or organizations: Not on file    Relationship status: Not on file  . Intimate partner violence:    Fear of current or ex partner: Not on file  Emotionally abused: Not on file    Physically abused: Not on file    Forced sexual activity: Not on file  Other Topics Concern  .  Not on file  Social History Narrative  . Not on file    Review of Systems: Gen: Denies any fever, chills, fatigue, weight loss, lack of appetite.  CV: Denies chest pain, heart palpitations, peripheral edema, syncope.  Resp: Denies shortness of breath at rest or with exertion. Denies wheezing or cough.  GI: see HPI . GU : Denies urinary burning, urinary frequency, urinary hesitancy MS: see HPI  Derm: Denies rash, itching, dry skin Psych: Denies depression, anxiety, memory loss, and confusion Heme: Denies bruising, bleeding, and enlarged lymph nodes.  Physical Exam: BP 108/73   Pulse 71   Temp 98.9 F (37.2 C) (Oral)   Ht 5' (1.524 m)   BMI 42.77 kg/m  General:   Alert and oriented. Pleasant and cooperative. Well-nourished and well-developed. Slow speech but easily understood Head:  Normocephalic and atraumatic. Eyes:  Without icterus, sclera clear and conjunctiva pink.  Ears:  Normal auditory acuity. Nose:  No deformity, discharge,  or lesions. Mouth:  No deformity or lesions, Lungs:  Clear to auscultation bilaterally.  Heart:  S1, S2 present without murmurs appreciated.  Abdomen:  +BS, soft, non-tender and non-distended. Obese. No HSM noted. No guarding or rebound. No masses appreciated. Baclofen pump palpable LLQ  Rectal:  Deferred  Msk:  Left-side hemiparesis, right upper extremity tremors intermittently, lower extremities with contractures of bilateral feet  Extremities:  With 2+ pedal edema  Neurologic:  Alert and  oriented x4 Psych:  Alert and cooperative. Normal mood and affect.

## 2018-01-05 ENCOUNTER — Other Ambulatory Visit: Payer: Self-pay

## 2018-01-05 ENCOUNTER — Telehealth: Payer: Self-pay

## 2018-01-05 DIAGNOSIS — R112 Nausea with vomiting, unspecified: Secondary | ICD-10-CM

## 2018-01-05 NOTE — Telephone Encounter (Signed)
Spoke to [employer] at [place]. EGD/-/+DIL scheduled for 02/10/18 at 12:00pm. Orders entered.

## 2018-01-05 NOTE — Telephone Encounter (Signed)
[place] to schedule EGD/-/+DIL w/Propofol. Arbie Cookey isn't working today. I was transferred to [facility] but no answer.

## 2018-01-05 NOTE — Telephone Encounter (Signed)
Called and informed Tiffany of pre-op appt 02/02/18 at 1:45pm. Instructions and pre-op appt letter faxed to [phone] attn: Tiffany.

## 2018-01-05 NOTE — Progress Notes (Signed)
CC'D TO PCP °

## 2018-01-10 ENCOUNTER — Other Ambulatory Visit (HOSPITAL_COMMUNITY): Payer: Self-pay | Admitting: *Deleted

## 2018-01-10 DIAGNOSIS — D696 Thrombocytopenia, unspecified: Secondary | ICD-10-CM

## 2018-01-11 ENCOUNTER — Other Ambulatory Visit (HOSPITAL_COMMUNITY): Payer: Medicare Other

## 2018-01-11 ENCOUNTER — Ambulatory Visit (INDEPENDENT_AMBULATORY_CARE_PROVIDER_SITE_OTHER): Payer: Medicare Other | Admitting: Urology

## 2018-01-11 DIAGNOSIS — N200 Calculus of kidney: Secondary | ICD-10-CM

## 2018-01-14 ENCOUNTER — Inpatient Hospital Stay (HOSPITAL_COMMUNITY): Payer: Medicare Other | Attending: Internal Medicine

## 2018-01-14 DIAGNOSIS — Z87440 Personal history of urinary (tract) infections: Secondary | ICD-10-CM | POA: Insufficient documentation

## 2018-01-14 DIAGNOSIS — E119 Type 2 diabetes mellitus without complications: Secondary | ICD-10-CM | POA: Insufficient documentation

## 2018-01-14 DIAGNOSIS — G4730 Sleep apnea, unspecified: Secondary | ICD-10-CM | POA: Diagnosis not present

## 2018-01-14 DIAGNOSIS — D696 Thrombocytopenia, unspecified: Secondary | ICD-10-CM | POA: Diagnosis not present

## 2018-01-14 DIAGNOSIS — D72820 Lymphocytosis (symptomatic): Secondary | ICD-10-CM | POA: Diagnosis not present

## 2018-01-14 DIAGNOSIS — I69354 Hemiplegia and hemiparesis following cerebral infarction affecting left non-dominant side: Secondary | ICD-10-CM | POA: Insufficient documentation

## 2018-01-14 DIAGNOSIS — E039 Hypothyroidism, unspecified: Secondary | ICD-10-CM | POA: Diagnosis not present

## 2018-01-14 DIAGNOSIS — Z87442 Personal history of urinary calculi: Secondary | ICD-10-CM | POA: Diagnosis not present

## 2018-01-14 DIAGNOSIS — G20 Parkinson's disease: Secondary | ICD-10-CM | POA: Insufficient documentation

## 2018-01-14 DIAGNOSIS — Z9989 Dependence on other enabling machines and devices: Secondary | ICD-10-CM | POA: Insufficient documentation

## 2018-01-14 DIAGNOSIS — E785 Hyperlipidemia, unspecified: Secondary | ICD-10-CM | POA: Insufficient documentation

## 2018-01-14 DIAGNOSIS — Z7982 Long term (current) use of aspirin: Secondary | ICD-10-CM | POA: Insufficient documentation

## 2018-01-14 DIAGNOSIS — I10 Essential (primary) hypertension: Secondary | ICD-10-CM | POA: Insufficient documentation

## 2018-01-14 DIAGNOSIS — Z79899 Other long term (current) drug therapy: Secondary | ICD-10-CM | POA: Insufficient documentation

## 2018-01-14 LAB — CBC WITH DIFFERENTIAL/PLATELET
Abs Immature Granulocytes: 0.03 10*3/uL (ref 0.00–0.07)
BASOS ABS: 0 10*3/uL (ref 0.0–0.1)
BASOS PCT: 0 %
Eosinophils Absolute: 0.1 10*3/uL (ref 0.0–0.5)
Eosinophils Relative: 2 %
HCT: 39 % (ref 36.0–46.0)
Hemoglobin: 11.5 g/dL — ABNORMAL LOW (ref 12.0–15.0)
Immature Granulocytes: 1 %
Lymphocytes Relative: 45 %
Lymphs Abs: 2.6 10*3/uL (ref 0.7–4.0)
MCH: 29.1 pg (ref 26.0–34.0)
MCHC: 29.5 g/dL — ABNORMAL LOW (ref 30.0–36.0)
MCV: 98.7 fL (ref 80.0–100.0)
Monocytes Absolute: 0.4 10*3/uL (ref 0.1–1.0)
Monocytes Relative: 7 %
NRBC: 0 % (ref 0.0–0.2)
Neutro Abs: 2.6 10*3/uL (ref 1.7–7.7)
Neutrophils Relative %: 45 %
PLATELETS: 141 10*3/uL — AB (ref 150–400)
RBC: 3.95 MIL/uL (ref 3.87–5.11)
RDW: 14.6 % (ref 11.5–15.5)
WBC: 5.7 10*3/uL (ref 4.0–10.5)

## 2018-01-14 LAB — COMPREHENSIVE METABOLIC PANEL
ALBUMIN: 3.3 g/dL — AB (ref 3.5–5.0)
ALK PHOS: 51 U/L (ref 38–126)
ALT: 8 U/L (ref 0–44)
ANION GAP: 6 (ref 5–15)
AST: 14 U/L — ABNORMAL LOW (ref 15–41)
BUN: 18 mg/dL (ref 8–23)
CALCIUM: 8.6 mg/dL — AB (ref 8.9–10.3)
CHLORIDE: 105 mmol/L (ref 98–111)
CO2: 31 mmol/L (ref 22–32)
Creatinine, Ser: 0.69 mg/dL (ref 0.44–1.00)
GFR calc Af Amer: >60 mL/min (ref >60)
GFR calc non Af Amer: >60 mL/min (ref >60)
GLUCOSE: 115 mg/dL — AB (ref 70–99)
POTASSIUM: 3.7 mmol/L (ref 3.5–5.1)
SODIUM: 142 mmol/L (ref 135–145)
Total Bilirubin: 0.2 mg/dL — ABNORMAL LOW (ref 0.3–1.2)
Total Protein: 6.9 g/dL (ref 6.5–8.1)

## 2018-01-14 LAB — LACTATE DEHYDROGENASE: LDH: 100 U/L (ref 98–192)

## 2018-01-18 ENCOUNTER — Inpatient Hospital Stay (HOSPITAL_BASED_OUTPATIENT_CLINIC_OR_DEPARTMENT_OTHER): Payer: Medicare Other | Admitting: Internal Medicine

## 2018-01-18 ENCOUNTER — Encounter (HOSPITAL_COMMUNITY): Payer: Self-pay | Admitting: Internal Medicine

## 2018-01-18 ENCOUNTER — Telehealth (HOSPITAL_COMMUNITY): Payer: Self-pay | Admitting: Surgery

## 2018-01-18 ENCOUNTER — Other Ambulatory Visit: Payer: Self-pay

## 2018-01-18 VITALS — BP 197/152 | HR 76 | Temp 98.4°F | Resp 18

## 2018-01-18 DIAGNOSIS — I69354 Hemiplegia and hemiparesis following cerebral infarction affecting left non-dominant side: Secondary | ICD-10-CM

## 2018-01-18 DIAGNOSIS — Z87442 Personal history of urinary calculi: Secondary | ICD-10-CM

## 2018-01-18 DIAGNOSIS — E039 Hypothyroidism, unspecified: Secondary | ICD-10-CM

## 2018-01-18 DIAGNOSIS — E119 Type 2 diabetes mellitus without complications: Secondary | ICD-10-CM

## 2018-01-18 DIAGNOSIS — I10 Essential (primary) hypertension: Secondary | ICD-10-CM

## 2018-01-18 DIAGNOSIS — D72820 Lymphocytosis (symptomatic): Secondary | ICD-10-CM | POA: Diagnosis not present

## 2018-01-18 DIAGNOSIS — Z87440 Personal history of urinary (tract) infections: Secondary | ICD-10-CM

## 2018-01-18 DIAGNOSIS — Z79899 Other long term (current) drug therapy: Secondary | ICD-10-CM

## 2018-01-18 DIAGNOSIS — D696 Thrombocytopenia, unspecified: Secondary | ICD-10-CM | POA: Diagnosis not present

## 2018-01-18 DIAGNOSIS — E785 Hyperlipidemia, unspecified: Secondary | ICD-10-CM

## 2018-01-18 DIAGNOSIS — Z7982 Long term (current) use of aspirin: Secondary | ICD-10-CM

## 2018-01-18 DIAGNOSIS — Z9989 Dependence on other enabling machines and devices: Secondary | ICD-10-CM

## 2018-01-18 DIAGNOSIS — G4730 Sleep apnea, unspecified: Secondary | ICD-10-CM

## 2018-01-18 DIAGNOSIS — G20 Parkinson's disease: Secondary | ICD-10-CM | POA: Diagnosis not present

## 2018-01-18 NOTE — Telephone Encounter (Signed)
Pt's blood pressure at her appointment today was 197/152.  Per Dr. Walden Field, [facility] notified of value (RN-Ashley) and told to recheck her blood pressure and also to check medications to see if a prn medication should be given to help lower the pt's blood pressure.  RN Caryl Pina verbalized understanding.

## 2018-01-18 NOTE — Patient Instructions (Signed)
[facility] at [facility]  Discharge Instructions: You saw Dr. Higgs today                               _______________________________________________________________  Thank you for choosing [facility] at [facility] to provide your oncology and hematology care.  To afford each patient quality time with our providers, please arrive at least 15 minutes before your scheduled appointment.  You need to re-schedule your appointment if you arrive 10 or more minutes late.  We strive to give you quality time with our providers, and arriving late affects you and other patients whose appointments are after yours.  Also, if you no show three or more times for appointments you may be dismissed from the clinic.  Again, thank you for choosing [facility] at [facility]. Our hope is that these requests will allow you access to exceptional care and in a timely manner. _______________________________________________________________  If you have questions after your visit, please contact our office at [phone] between the hours of 8:30 a.m. and 5:00 p.m. Voicemails left after 4:30 p.m. will not be returned until the following business day. _______________________________________________________________  For prescription refill requests, have your pharmacy contact our office. _______________________________________________________________  Recommendations made by the consultant and any test results will be sent to your referring physician. _______________________________________________________________ 

## 2018-01-18 NOTE — Progress Notes (Signed)
Diagnosis Thrombocytopenia ([employer]) - Plan: CBC with Differential/Platelet, Comprehensive metabolic panel, Lactate dehydrogenase  Staging Cancer Staging No matching staging information was found for the patient.  Assessment and Plan:  1.  Thrombocytopenia.  Labs done 01/14/2018 reviewed and showed WBC 5.7 HB 11.5 plts 141,000.  Chemistries WNL with K+ 3..7 Cr 0.69 and normal LFTs.   Suspect thrombocytopenia may be due to drug resistant UTI treated previously. Plt count is improved at 141,000.  Hepatitis and HIV testing is negative.  Pt will have repeat labs in 07/2018.    2.  Lymphocytosis.   Lymphocyte count improved at 45%.  Flow cytometry done 11/17/2017 negative. WBC WNL at 5.7.  Will repeat labs on RTC in 07/2018.   3.  UTI.  Pt had UA done 09/2017 that showed drug resistant E coli.  She was treated with IV abx.  Ongoing NH follow-up for monitoring of symptoms.    4.  Kidney stones.  Pt should follow-up with urology as recommended.   5.  HTN.  BP is elevated at 197/152.  Will notify [facility] for management.  Records indicate pt has PRN orders.    6.  CVA.  PT in wheelchair.  Follow-up with neurology or PCP as recommended.    25 minutes spent with more than 50% spent in counseling and coordination of care.    Interval history:  Historical data obtained from note dated 11/17/2017.  62 year old female who is a nursing home patient had [facility].  She is referred today for evaluation of thrombocytopenia.  She had a recent diagnosis of a E. coli that was drug-resistant in July 2019.  She had a PICC line placed and was treated with antibiotics by her PCP.  Family member is unaware if of urine culture was repeated.  Review of records show labs done 10/18/2017 which showed a white count of 5.5 hemoglobin 10.9 platelets 111,000.  Chemistries within normal limits with a creatinine of 0.72 sodium was 146.  Labs were repeated on 10/25/2017 and showed a white count 5.7 hemoglobin 11.9 platelets 90,000 she  had normal differential.  Chemistries within normal limits with a creatinine of 0.68 she denies any new medications other than antibiotics that were used for recent UTI.  She reportedly has been diagnosed with kidney stones.  Denies any fevers at home.  She has noted no adenopathy.   Current Status:  Pt is seen today for follow-up to go over labs.  Pt is accompanied by NH staff.    Problem List Patient Active Problem List   Diagnosis Date Noted  . Constipation [K59.00] 01/04/2018  . N&V (nausea and vomiting) [R11.2] 01/04/2018  . Pressure injury of skin [L89.90] 09/03/2017  . Calculus of kidney [N20.0] 09/02/2017  . Renal stone [N20.0] 07/12/2017  . Toxic encephalopathy [G92] 05/20/2017  . Pyelonephritis [N12] 05/20/2017  . ESBL (extended spectrum beta-lactamase) producing bacteria infection [A49.9, Z16.12] 05/20/2017  . Renal calculus [N20.0] 05/17/2017  . Severe sepsis with septic shock ([place]) [A41.9, R65.21] 04/23/2017  . AKI (acute kidney injury) ([place]) [N17.9] 04/21/2017  . CVA (cerebral vascular accident) ([place]) [I63.9] 04/21/2017  . HTN (hypertension) [I10] 11/21/2012  . Lower urinary tract infectious disease [N39.0] 11/18/2012  . Sepsis ([place]) [A41.9] 11/18/2012  . Thrombocytopenia, unspecified ([place]) [D69.6] 07/28/2012  . Central sleep apnea [G47.31] 07/28/2012  . Hemiparesis affecting left side as late effect of cerebrovascular accident [facility]) [G26.948] 07/28/2012  . Hypothyroidism [E03.9] 07/28/2012  . Hypercapnia [R06.89] 07/28/2012  . Seizure disorder ([place]) [N46.270] 07/28/2012  .  Narcotic overdose ([place]) [T40.601A] 07/27/2012  . Acute respiratory failure ([place]) [J96.00] 07/27/2012  . Morbid obesity ([place]) [E66.01] 07/27/2012  . History of stroke [Z86.73] 07/27/2012  . Chronic pain disorder [G89.4] 07/27/2012    Past Medical History Past Medical History:  Diagnosis Date  . Acute kidney failure (HCC)    hx of   . Acute respiratory failure ([place]) 07/27/2012  . Arthritis     OSTEO LEFT LEG  . Bilateral cataracts   . Candidiasis of skin and nail   . Chronic pain   . Chronic pain syndrome   . Constipation   . Contractures involving both knees    and ankles   . CVA (cerebral vascular accident) ([place])    left sided hemiparesis  . Depression   . Diabetes mellitus without complication (HCC)    type II, DIET CONTROLLED  . Dry eye syndrome of bilateral lacrimal glands   . Dysphagia   . Feeding difficulties   . 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 GERD (gastroesophageal reflux disease)   . Hemiparesis affecting left side as late effect of cerebrovascular accident ([place])   . History of kidney stones   . Hyperlipidemia   . Hypertension   . Metabolic encephalopathy 24/11/7351  . Morbid obesity ([place])   . Muscle weakness (generalized)   . Narcotic overdose ([place]) 07/27/2012  . Parkinson's disease ([place])   . Peripheral vascular disease ([place])   . Polymyalgia rheumatica ([place])   . Primary insomnia   . Respiratory failure ([place]) 05/06/10  . Seizure disorder ([place])   . Sepsis ([place])    hx of   . Sleep apnea 04/2010   on CPAP, "severe central sleep apnea"  . Spasticity    chronic  . Thrombocytopenia ([place])    related to depakote  . Thrombocytopenia ([place])   . Tinea unguium   . Unspecified hypothyroidism   . 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[facility])   . Urinary incontinence   . UTI (lower urinary tract infection) 05/06/10    Past Surgical History Past Surgical History:  Procedure Laterality Date  . CEREBRAL ANEURYSM REPAIR  1999  . CYSTOSCOPY W/ URETERAL STENT PLACEMENT Bilateral 04/22/2017   Procedure: CYSTOSCOPY WITH BILATERAL  RETROGRADE PYELOGRAM, RIGHT URETERAL STENT PLACEMENT;  Surgeon: Festus Aloe, MD;  Location: [facility];  Service: Urology;  Laterality: Bilateral;  . CYSTOSCOPY W/ URETERAL STENT REMOVAL Right 05/17/2017   Procedure: CYSTOSCOPY WITH STENT REMOVAL;  Surgeon: Franchot Gallo, MD;  Location: [facility];  Service: Urology;  Laterality: Right;  . CYSTOSCOPY W/ URETERAL STENT REMOVAL Bilateral 07/12/2017   Procedure: CYSTOSCOPY WITH STENT EXTRACTION;  Surgeon: Franchot Gallo, MD;  Location: [facility];  Service: Urology;  Laterality: Bilateral;  . CYSTOSCOPY/URETEROSCOPY/HOLMIUM LASER/STENT PLACEMENT Right 05/17/2017   Procedure: CYSTOSCOPY/URETEROSCOPY/HOLMIUM LASER/STENT PLACEMENT/BILATERAL STENT PLACEMENTS ,RIGHT STONE EXTRACTION;  Surgeon: Franchot Gallo, MD;  Location: [facility];  Service: Urology;  Laterality: Right;  . CYSTOSCOPY/URETEROSCOPY/HOLMIUM LASER/STENT PLACEMENT Bilateral 07/12/2017   Procedure: CYSTOSCOPY/DIAGNOSTIC BILATERAL URETEROSCOPY/BILATERAL STENT PLACEMENT;  Surgeon: Franchot Gallo, MD;  Location: [facility];  Service: Urology;  Laterality: Bilateral;  . CYSTOSCOPY/URETEROSCOPY/HOLMIUM LASER/STENT PLACEMENT Bilateral 09/02/2017   Procedure: CYSTOSCOPY/ URETEROSCOPY/ RETROGRADE PYELOGRAM/ STENT EXCHANGE/ STONE EXTRACTION;  Surgeon: Franchot Gallo, MD;  Location: [facility];  Service: Urology;  Laterality: Bilateral;  . HOLMIUM LASER APPLICATION Left 2/99/2426   Procedure: HOLMIUM LASER APPLICATION;  Surgeon: Franchot Gallo, MD;  Location: [facility];  Service: Urology;  Laterality: Left;  .  INTRATHECAL PUMP IMPLANTATION  2010   [employer]:  fentanyl, baclofen changed to morphine and baclofen on 07/2012  . PICC LINE INSERTION     RIGHT ARM    Family History Family History  Problem Relation Age of Onset  . Colon cancer Neg Hx   . Colon polyps Neg Hx      Social History  reports that she has never smoked. She has never used smokeless tobacco. She reports that she does not drink alcohol or use drugs.  Medications  Current Outpatient Medications:  .  acetaminophen (TYLENOL) 325 MG tablet, Take 2 tablets (650 mg total) by mouth every 4 (four) hours as needed for mild pain (or temp > 37.5 C (99.5 F))., Disp: 30 tablet, Rfl: 2 .  AMINO  ACIDS-PROTEIN HYDROLYS PO, Take 30 mLs by mouth daily. , Disp: , Rfl:  .  aspirin EC 81 MG tablet, Take 81 mg by mouth daily., Disp: , Rfl:  .  cloNIDine (CATAPRES) 0.1 MG tablet, Take 0.1 mg by mouth every 6 (six) hours as needed (for SBP greater than 160)., Disp: , Rfl:  .  divalproex (DEPAKOTE) 125 MG DR tablet, Take 125 mg by mouth 2 (two) times daily., Disp: , Rfl:  .  divalproex (DEPAKOTE) 500 MG DR tablet, Take 500 mg by mouth 2 (two) times daily., Disp: , Rfl:  .  DULoxetine (CYMBALTA) 20 MG capsule, Take 20 mg by mouth daily., Disp: , Rfl:  .  DULoxetine (CYMBALTA) 60 MG capsule, Take 60 mg by mouth daily., Disp: , Rfl:  .  furosemide (LASIX) 20 MG tablet, Take 20 mg by mouth daily. , Disp: , Rfl:  .  labetalol (NORMODYNE) 200 MG tablet, Take 200 mg by mouth 2 (two) times daily., Disp: , Rfl:  .  lactulose, encephalopathy, (CHRONULAC) 10 GM/15ML SOLN, Take 20 g by mouth daily. , Disp: , Rfl:  .  levETIRAcetam (KEPPRA) 500 MG tablet, Take 1,000-1,500 mg by mouth See admin instructions. Take 1000 mg by mouth in the morning and take 1500 mg by mouth at bedtime, Disp: , Rfl:  .  levothyroxine (SYNTHROID, LEVOTHROID) 125 MCG tablet, Take 125 mcg by mouth daily before breakfast., Disp: , Rfl:  .  lidocaine (LIDODERM) 5 %, Place 1 patch onto the skin daily. Remove & Discard patch within 12 hours or as directed by MD, Disp: , Rfl:  .  Lidocaine-Glycerin (PREPARATION H) 5-14.4 % CREA, Place 1 application rectally every 4 (four) hours as needed (hemorrhoids)., Disp: , Rfl:  .  linaclotide (LINZESS) 145 MCG CAPS capsule, Take 145 mcg by mouth daily before breakfast., Disp: , Rfl:  .  loratadine (CLARITIN) 10 MG tablet, Take 10 mg by mouth daily., Disp: , Rfl:  .  losartan (COZAAR) 50 MG tablet, Take 50 mg by mouth at bedtime., Disp: , Rfl:  .  lubiprostone (AMITIZA) 24 MCG capsule, Take 1 capsule (24 mcg total) by mouth 2 (two) times daily with a meal., Disp: 60 capsule, Rfl: 3 .  Menthol, Topical  Analgesic, 5 % GEL, Apply 1 application topically every 6 (six) hours as needed (for pain). , Disp: , Rfl:  .  Multiple Vitamins-Minerals (CERTAGEN PO), Take 1 tablet by mouth daily., Disp: , Rfl:  .  Olopatadine HCl (PATADAY) 0.2 % SOLN, Place 1 drop into both eyes at bedtime., Disp: , Rfl:  .  omeprazole (PRILOSEC) 20 MG capsule, Take 40 mg by mouth daily. , Disp: , Rfl:  .  ondansetron (ZOFRAN) 4 MG tablet,  Take 4 mg by mouth every 8 (eight) hours as needed for nausea or vomiting., Disp: , Rfl:  .  oxyCODONE (OXY IR/ROXICODONE) 5 MG immediate release tablet, Take 1 tablet (5 mg total) by mouth every 8 (eight) hours as needed., Disp: 15 tablet, Rfl: 0 .  Polyethyl Glycol-Propyl Glycol (SYSTANE) 0.4-0.3 % SOLN, Place 1 drop into both eyes every 8 (eight) hours as needed (for dry eyes). , Disp: , Rfl:  .  polyethylene glycol (MIRALAX / GLYCOLAX) packet, Take 17 g by mouth 3 (three) times daily. , Disp: , Rfl:  .  rosuvastatin (CRESTOR) 5 MG tablet, Take 5 mg by mouth at bedtime., Disp: , Rfl:  .  saccharomyces boulardii (FLORASTOR) 250 MG capsule, Take 250 mg by mouth 2 (two) times daily., Disp: , Rfl:  .  sennosides-docusate sodium (SENOKOT-S) 8.6-50 MG tablet, Take 1 tablet by mouth at bedtime., Disp: , Rfl:  .  traZODone (DESYREL) 50 MG tablet, Take 50 mg by mouth at bedtime., Disp: , Rfl:  .  vitamin C (ASCORBIC ACID) 500 MG tablet, Take 500 mg by mouth daily., Disp: , Rfl:  .  zinc sulfate 220 (50 Zn) MG capsule, Take 220 mg by mouth daily., Disp: , Rfl:   Allergies Codeine; Lacosamide; Morphine; and Zolpidem  Review of Systems Review of Systems - Oncology ROS negative other than associated with comorbidity   Physical Exam  Vitals T 98.4 HR 76 RR 18 BP 197/152 pulse ox 98%  Wt Readings from Last 3 Encounters:  11/17/17 219 lb (99.3 kg)  09/02/17 258 lb (117 kg)  07/12/17 270 lb 15.1 oz (122.9 kg)   Temp Readings from Last 3 Encounters:  01/04/18 98.9 F (37.2 C) (Oral)   11/17/17 98.8 F (37.1 C) (Oral)  09/05/17 99.3 F (37.4 C) (Oral)   BP Readings from Last 3 Encounters:  01/04/18 108/73  11/17/17 132/74  11/02/17 (!) 90/47   Pulse Readings from Last 3 Encounters:  01/04/18 71  11/17/17 88  11/02/17 73    Constitutional: Well-developed, well-nourished, and in no distress.  In wheelchair HENT: Head: Normocephalic and atraumatic.  Mouth/Throat: No oropharyngeal exudate. Mucosa moist. Eyes: Pupils are equal, round, and reactive to light. Conjunctivae are normal. No scleral icterus.  Neck: Normal range of motion. Neck supple. No JVD present.  Cardiovascular: Normal rate, regular rhythm and normal heart sounds.  Exam reveals no gallop and no friction rub.   No murmur heard. Pulmonary/Chest: Effort normal and breath sounds normal. No respiratory distress. No wheezes.No rales.  Abdominal: Soft. Bowel sounds are normal. No distension. There is no tenderness. There is no guarding.  Musculoskeletal: No edema or tenderness.  Lymphadenopathy:  No cervical, axillary or supraclavicular adenopathy.  Neurological: Alert.  Evidence of prior CVA.   Skin: Skin is warm and dry. No rash noted. No erythema. No pallor.  Psychiatric: Alert.    Labs No visits with results within 3 Day(s) from this visit.  Latest known visit with results is:  Appointment on 01/14/2018  Component Date Value Ref Range Status  . WBC 01/14/2018 5.7  4.0 - 10.5 K/uL Final  . RBC 01/14/2018 3.95  3.87 - 5.11 MIL/uL Final  . Hemoglobin 01/14/2018 11.5* 12.0 - 15.0 g/dL Final  . HCT 01/14/2018 39.0  36.0 - 46.0 % Final  . MCV 01/14/2018 98.7  80.0 - 100.0 fL Final  . MCH 01/14/2018 29.1  26.0 - 34.0 pg Final  . MCHC 01/14/2018 29.5* 30.0 - 36.0 g/dL Final  .  RDW 01/14/2018 14.6  11.5 - 15.5 % Final  . Platelets 01/14/2018 141* 150 - 400 K/uL Final  . nRBC 01/14/2018 0.0  0.0 - 0.2 % Final  . Neutrophils Relative % 01/14/2018 45  % Final  . Neutro Abs 01/14/2018 2.6  1.7 - 7.7 K/uL  Final  . Lymphocytes Relative 01/14/2018 45  % Final  . Lymphs Abs 01/14/2018 2.6  0.7 - 4.0 K/uL Final  . Monocytes Relative 01/14/2018 7  % Final  . Monocytes Absolute 01/14/2018 0.4  0.1 - 1.0 K/uL Final  . Eosinophils Relative 01/14/2018 2  % Final  . Eosinophils Absolute 01/14/2018 0.1  0.0 - 0.5 K/uL Final  . Basophils Relative 01/14/2018 0  % Final  . Basophils Absolute 01/14/2018 0.0  0.0 - 0.1 K/uL Final  . Immature Granulocytes 01/14/2018 1  % Final  . Abs Immature Granulocytes 01/14/2018 0.03  0.00 - 0.07 K/uL Final   Performed at [facility], [address] [place], [place] [address]  . Sodium 01/14/2018 142  135 - 145 mmol/L Final  . Potassium 01/14/2018 3.7  3.5 - 5.1 mmol/L Final  . Chloride 01/14/2018 105  98 - 111 mmol/L Final  . CO2 01/14/2018 31  22 - 32 mmol/L Final  . Glucose, Bld 01/14/2018 115* 70 - 99 mg/dL Final  . BUN 01/14/2018 18  8 - 23 mg/dL Final  . Creatinine, Ser 01/14/2018 0.69  0.44 - 1.00 mg/dL Final  . Calcium 01/14/2018 8.6* 8.9 - 10.3 mg/dL Final  . Total Protein 01/14/2018 6.9  6.5 - 8.1 g/dL Final  . Albumin 01/14/2018 3.3* 3.5 - 5.0 g/dL Final  . AST 01/14/2018 14* 15 - 41 U/L Final  . ALT 01/14/2018 8  0 - 44 U/L Final  . Alkaline Phosphatase 01/14/2018 51  38 - 126 U/L Final  . Total Bilirubin 01/14/2018 0.2* 0.3 - 1.2 mg/dL Final  . GFR calc non Af Amer 01/14/2018 >60  >60 mL/min Final  . GFR calc Af Amer 01/14/2018 >60  >60 mL/min Final   Comment: (NOTE) The eGFR has been calculated using the CKD EPI equation. This calculation has not been validated in all clinical situations. eGFR's persistently <60 mL/min signify possible Chronic Kidney Disease.   Georgiann Hahn gap 01/14/2018 6  5 - 15 Final   Performed at [facility], [address], [place], [place] [address]  . LDH 01/14/2018 100  98 - 192 U/L Final   Performed at [facility], [address] [place], [place] [address]     Pathology Orders Placed This Encounter  Procedures  .  CBC with Differential/Platelet    Standing Status:   Future    Standing Expiration Date:   01/19/2020  . Comprehensive metabolic panel    Standing Status:   Future    Standing Expiration Date:   01/19/2020  . Lactate dehydrogenase    Standing Status:   Future    Standing Expiration Date:   01/19/2020       Zoila Shutter MD

## 2018-02-01 ENCOUNTER — Encounter (HOSPITAL_COMMUNITY): Payer: Self-pay

## 2018-02-01 NOTE — Patient Instructions (Signed)
    LENNY BOUCHILLON  02/01/2018     @PREFPERIOPPHARMACY @   Your procedure is scheduled on  02/10/2018   Report to [facility] at  11   A.M.  Call this number if you have problems the morning of surgery:  [phone]   Remember:  Do not eat or drink after midnight.  You may drink clear liquids until  6 pm 02/09/2018 .  Clear liquids allowed are:   See enclosed instructions                  Take these medicines the morning of surgery with A SIP OF WATER  Clonidine, depakote, cymbalta, labetolol, keppra, levothyroxine, claritin, prilosec, zofran( if needed)., oxycodone( if needed).     Do not wear jewelry, make-up or nail polish.  Do not wear lotions, powders, or perfumes, or deodorant.  Do not shave 48 hours prior to surgery.  Men may shave face and neck.  Do not bring valuables to the hospital.  [facility] is not responsible for any belongings or valuables.  Contacts, dentures or bridgework may not be worn into surgery.  Leave your suitcase in the car.  After surgery it may be brought to your room.  For patients admitted to the hospital, discharge time will be determined by your treatment team.  Patients discharged the day of surgery will not be allowed to drive home.   Name and phone number of your driver:   [facility]. Special instructions:  None  Please read over the following fact sheets that you were given. Anesthesia Post-op Instructions and Care and Recovery After Surgery

## 2018-02-02 ENCOUNTER — Encounter (HOSPITAL_COMMUNITY)
Admission: RE | Admit: 2018-02-02 | Discharge: 2018-02-02 | Disposition: A | Discharge status: Home / Self Care | Payer: Medicare Other | Source: Ambulatory Visit | Attending: Internal Medicine | Admitting: Internal Medicine

## 2018-02-02 ENCOUNTER — Encounter (HOSPITAL_COMMUNITY): Payer: Self-pay

## 2018-02-10 ENCOUNTER — Encounter (HOSPITAL_COMMUNITY): Payer: Self-pay | Admitting: Anesthesiology

## 2018-02-10 ENCOUNTER — Encounter (HOSPITAL_COMMUNITY)
Admission: RE | Disposition: A | Discharge status: Skilled Nursing Facility | Payer: Self-pay | Source: Ambulatory Visit | Attending: Internal Medicine

## 2018-02-10 ENCOUNTER — Ambulatory Visit (HOSPITAL_COMMUNITY): Payer: Medicare Other | Admitting: Anesthesiology

## 2018-02-10 ENCOUNTER — Ambulatory Visit (HOSPITAL_COMMUNITY)
Admission: RE | Admit: 2018-02-10 | Discharge: 2018-02-10 | Disposition: A | Discharge status: Skilled Nursing Facility | Payer: Medicare Other | Source: Ambulatory Visit | Attending: Internal Medicine | Admitting: Internal Medicine

## 2018-02-10 DIAGNOSIS — M1990 Unspecified osteoarthritis, unspecified site: Secondary | ICD-10-CM | POA: Diagnosis not present

## 2018-02-10 DIAGNOSIS — G894 Chronic pain syndrome: Secondary | ICD-10-CM | POA: Diagnosis not present

## 2018-02-10 DIAGNOSIS — Z888 Allergy status to other drugs, medicaments and biological substances status: Secondary | ICD-10-CM | POA: Diagnosis not present

## 2018-02-10 DIAGNOSIS — D696 Thrombocytopenia, unspecified: Secondary | ICD-10-CM | POA: Diagnosis not present

## 2018-02-10 DIAGNOSIS — K219 Gastro-esophageal reflux disease without esophagitis: Secondary | ICD-10-CM | POA: Diagnosis not present

## 2018-02-10 DIAGNOSIS — K317 Polyp of stomach and duodenum: Secondary | ICD-10-CM | POA: Diagnosis not present

## 2018-02-10 DIAGNOSIS — M353 Polymyalgia rheumatica: Secondary | ICD-10-CM | POA: Diagnosis not present

## 2018-02-10 DIAGNOSIS — E785 Hyperlipidemia, unspecified: Secondary | ICD-10-CM | POA: Insufficient documentation

## 2018-02-10 DIAGNOSIS — E1151 Type 2 diabetes mellitus with diabetic peripheral angiopathy without gangrene: Secondary | ICD-10-CM | POA: Diagnosis not present

## 2018-02-10 DIAGNOSIS — K5909 Other constipation: Secondary | ICD-10-CM | POA: Insufficient documentation

## 2018-02-10 DIAGNOSIS — E039 Hypothyroidism, unspecified: Secondary | ICD-10-CM | POA: Insufficient documentation

## 2018-02-10 DIAGNOSIS — Z885 Allergy status to narcotic agent status: Secondary | ICD-10-CM | POA: Insufficient documentation

## 2018-02-10 DIAGNOSIS — F329 Major depressive disorder, single episode, unspecified: Secondary | ICD-10-CM | POA: Diagnosis not present

## 2018-02-10 DIAGNOSIS — I69354 Hemiplegia and hemiparesis following cerebral infarction affecting left non-dominant side: Secondary | ICD-10-CM | POA: Insufficient documentation

## 2018-02-10 DIAGNOSIS — R112 Nausea with vomiting, unspecified: Secondary | ICD-10-CM | POA: Diagnosis present

## 2018-02-10 DIAGNOSIS — K3189 Other diseases of stomach and duodenum: Secondary | ICD-10-CM | POA: Insufficient documentation

## 2018-02-10 DIAGNOSIS — F5101 Primary insomnia: Secondary | ICD-10-CM | POA: Insufficient documentation

## 2018-02-10 DIAGNOSIS — Z6841 Body Mass Index (BMI) 40.0 and over, adult: Secondary | ICD-10-CM | POA: Diagnosis not present

## 2018-02-10 DIAGNOSIS — G4730 Sleep apnea, unspecified: Secondary | ICD-10-CM | POA: Diagnosis not present

## 2018-02-10 DIAGNOSIS — Z87442 Personal history of urinary calculi: Secondary | ICD-10-CM | POA: Insufficient documentation

## 2018-02-10 DIAGNOSIS — G20 Parkinson's disease: Secondary | ICD-10-CM | POA: Diagnosis not present

## 2018-02-10 DIAGNOSIS — G40909 Epilepsy, unspecified, not intractable, without status epilepticus: Secondary | ICD-10-CM | POA: Diagnosis not present

## 2018-02-10 DIAGNOSIS — I10 Essential (primary) hypertension: Secondary | ICD-10-CM | POA: Insufficient documentation

## 2018-02-10 DIAGNOSIS — Z79899 Other long term (current) drug therapy: Secondary | ICD-10-CM | POA: Diagnosis not present

## 2018-02-10 HISTORY — PX: ESOPHAGOGASTRODUODENOSCOPY (EGD) WITH PROPOFOL: SHX5813

## 2018-02-10 HISTORY — PX: MALONEY DILATION: SHX5535

## 2018-02-10 LAB — GLUCOSE, CAPILLARY
Glucose-Capillary: 77 mg/dL (ref 70–99)
Glucose-Capillary: 78 mg/dL (ref 70–99)

## 2018-02-10 SURGERY — ESOPHAGOGASTRODUODENOSCOPY (EGD) WITH PROPOFOL
Anesthesia: Monitor Anesthesia Care

## 2018-02-10 MED ORDER — KETAMINE HCL 10 MG/ML IJ SOLN
INTRAMUSCULAR | Status: DC | PRN
  Administered 2018-02-10: 10 mg via INTRAVENOUS

## 2018-02-10 MED ORDER — PROPOFOL 500 MG/50ML IV EMUL
INTRAVENOUS | Status: DC | PRN
  Administered 2018-02-10: 150 ug/kg/min via INTRAVENOUS

## 2018-02-10 MED ORDER — PROPOFOL 10 MG/ML IV BOLUS
INTRAVENOUS | Status: AC
  Filled 2018-02-10: qty 20

## 2018-02-10 MED ORDER — CHLORHEXIDINE GLUCONATE CLOTH 2 % EX PADS: 6.0000 | MEDICATED_PAD | Freq: Once | CUTANEOUS | Status: DC

## 2018-02-10 MED ORDER — GLYCOPYRROLATE 0.2 MG/ML IJ SOLN
INTRAMUSCULAR | Status: DC | PRN
  Administered 2018-02-10: 0.2 mg via INTRAVENOUS

## 2018-02-10 MED ORDER — LACTATED RINGERS IV SOLN
INTRAVENOUS | Status: DC
  Administered 2018-02-10: 1000 mL via INTRAVENOUS

## 2018-02-10 MED ORDER — PROPOFOL 10 MG/ML IV BOLUS
INTRAVENOUS | Status: AC
  Filled 2018-02-10: qty 40

## 2018-02-10 MED ORDER — LIDOCAINE HCL (CARDIAC) PF 50 MG/5ML IV SOSY
PREFILLED_SYRINGE | INTRAVENOUS | Status: DC | PRN
  Administered 2018-02-10: 40 mg via INTRAVENOUS

## 2018-02-10 MED ORDER — ARTIFICIAL TEARS OPHTHALMIC OINT
TOPICAL_OINTMENT | OPHTHALMIC | Status: AC
  Filled 2018-02-10: qty 3.5

## 2018-02-10 MED ORDER — KETAMINE HCL 50 MG/5ML IJ SOSY
PREFILLED_SYRINGE | INTRAMUSCULAR | Status: AC
  Filled 2018-02-10: qty 5

## 2018-02-10 NOTE — Anesthesia Preprocedure Evaluation (Signed)
Anesthesia Evaluation    Airway Mallampati: II       Dental  (+) Missing, Poor Dentition   Pulmonary sleep apnea ,     + decreased breath sounds      Cardiovascular hypertension, + Peripheral Vascular Disease   Rhythm:regular     Neuro/Psych Seizures -,  PSYCHIATRIC DISORDERS Depression CVA    GI/Hepatic GERD  ,  Endo/Other  diabetes, Type 2Hypothyroidism   Renal/GU Renal disease     Musculoskeletal   Abdominal   Peds  Hematology   Anesthesia Other Findings Morbid obesity, no reported wt, pt states 219# CVA 2008, hemiparesis Left Pending full dental extraction of remaining teeth   Reproductive/Obstetrics                             Anesthesia Physical Anesthesia Plan  ASA: IV  Anesthesia Plan: MAC   Post-op Pain Management:    Induction:   PONV Risk Score and Plan:   Airway Management Planned:   Additional Equipment:   Intra-op Plan:   Post-operative Plan:   Informed Consent:   Dental Advisory Given  Plan Discussed with: Anesthesiologist  Anesthesia Plan Comments:         Anesthesia Quick Evaluation

## 2018-02-10 NOTE — Discharge Instructions (Signed)
No MRI until clips gone  Further recommendations to follow pending review of pathology report   EGD Discharge instructions Please read the instructions outlined below and refer to this sheet in the next few weeks. These discharge instructions provide you with general information on caring for yourself after you leave the hospital. Your doctor may also give you specific instructions. While your treatment has been planned according to the most current medical practices available, unavoidable complications occasionally occur. If you have any problems or questions after discharge, please call your doctor. ACTIVITY  You may resume your regular activity but move at a slower pace for the next 24 hours.   Take frequent rest periods for the next 24 hours.   Walking will help expel (get rid of) the air and reduce the bloated feeling in your abdomen.   No driving for 24 hours (because of the anesthesia (medicine) used during the test).   You may shower.   Do not sign any important legal documents or operate any machinery for 24 hours (because of the anesthesia used during the test).  NUTRITION  Drink plenty of fluids.   You may resume your normal diet.   Begin with a light meal and progress to your normal diet.   Avoid alcoholic beverages for 24 hours or as instructed by your caregiver.  MEDICATIONS  You may resume your normal medications unless your caregiver tells you otherwise.  WHAT YOU CAN EXPECT TODAY  You may experience abdominal discomfort such as a feeling of fullness or gas pains.  FOLLOW-UP  Your doctor will discuss the results of your test with you.  SEEK IMMEDIATE MEDICAL ATTENTION IF ANY OF THE FOLLOWING OCCUR:  Excessive nausea (feeling sick to your stomach) and/or vomiting.   Severe abdominal pain and distention (swelling).   Trouble swallowing.   Temperature over 101 F (37.8 C).   Rectal bleeding or vomiting of blood.   PATIENT  INSTRUCTIONS POST-ANESTHESIA  IMMEDIATELY FOLLOWING SURGERY:  Do not drive or operate machinery for the first twenty four hours after surgery.  Do not make any important decisions for twenty four hours after surgery or while taking narcotic pain medications or sedatives.  If you develop intractable nausea and vomiting or a severe headache please notify your doctor immediately.  FOLLOW-UP:  Please make an appointment with your surgeon as instructed. You do not need to follow up with anesthesia unless specifically instructed to do so.  WOUND CARE INSTRUCTIONS (if applicable):  Keep a dry clean dressing on the anesthesia/puncture wound site if there is drainage.  Once the wound has quit draining you may leave it open to air.  Generally you should leave the bandage intact for twenty four hours unless there is drainage.  If the epidural site drains for more than 36-48 hours please call the anesthesia department.  QUESTIONS?:  Please feel free to call your physician or the hospital operator if you have any questions, and they will be happy to assist you.

## 2018-02-10 NOTE — H&P (Signed)
@LOGO @   Primary Care Physician:  Hilbert Corrigan, MD Primary Gastroenterologist:  Dr. Gala Romney Pre-Procedure History & Physical:   HPI:  [name] is a 62 y.o. female here for further evaluation of intermittent nausea and vomiting.  Patient denies dysphagia.  Also has chronic constipation.  Here for diagnostic EGD.  Past Medical History:  Diagnosis Date  . Acute kidney failure (HCC)    hx of   . Acute respiratory failure ([place]) 07/27/2012  . Arthritis    OSTEO LEFT LEG  . Bilateral cataracts   . Candidiasis of skin and nail   . Chronic pain   . Chronic pain syndrome   . Constipation   . Contractures involving both knees    and ankles   . CVA (cerebral vascular accident) ([place])    left sided hemiparesis  . Depression   . Diabetes mellitus without complication (HCC)    type II, DIET CONTROLLED  . Dry eye syndrome of bilateral lacrimal glands   . Dysphagia   . Feeding difficulties   . 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 GERD (gastroesophageal reflux disease)   . Hemiparesis affecting left side as late effect of cerebrovascular accident ([place])   . History of kidney stones   . Hyperlipidemia   . Hypertension   . Metabolic encephalopathy 72/53/6644  . Morbid obesity ([place])   . Muscle weakness (generalized)   . Narcotic overdose ([place]) 07/27/2012  . Parkinson's disease ([place])   . Peripheral vascular disease ([place])   . Polymyalgia rheumatica ([place])   . Primary insomnia   . Respiratory failure ([place]) 05/06/10  . Seizure disorder ([place])   . Sepsis ([place])    hx of   . Sleep apnea 04/2010   on CPAP, "severe central sleep apnea"  . Spasticity    chronic  . Thrombocytopenia ([place])    related to depakote  . Thrombocytopenia ([place])   . Tinea unguium   . Unspecified hypothyroidism   . 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 Urinary incontinence   . UTI (lower urinary tract infection) 05/06/10    Past  Surgical History:  Procedure Laterality Date  . CEREBRAL ANEURYSM REPAIR  1999  . CYSTOSCOPY W/ URETERAL STENT PLACEMENT Bilateral 04/22/2017   Procedure: CYSTOSCOPY WITH BILATERAL  RETROGRADE PYELOGRAM, RIGHT URETERAL STENT PLACEMENT;  Surgeon: Festus Aloe, MD;  Location: [facility];  Service: Urology;  Laterality: Bilateral;  . CYSTOSCOPY W/ URETERAL STENT REMOVAL Right 05/17/2017   Procedure: CYSTOSCOPY WITH STENT REMOVAL;  Surgeon: Franchot Gallo, MD;  Location: [facility];  Service: Urology;  Laterality: Right;  . CYSTOSCOPY W/ URETERAL STENT REMOVAL Bilateral 07/12/2017   Procedure: CYSTOSCOPY WITH STENT EXTRACTION;  Surgeon: Franchot Gallo, MD;  Location: [facility];  Service: Urology;  Laterality: Bilateral;  . CYSTOSCOPY/URETEROSCOPY/HOLMIUM LASER/STENT PLACEMENT Right 05/17/2017   Procedure: CYSTOSCOPY/URETEROSCOPY/HOLMIUM LASER/STENT PLACEMENT/BILATERAL STENT PLACEMENTS ,RIGHT STONE EXTRACTION;  Surgeon: Franchot Gallo, MD;  Location: [facility];  Service: Urology;  Laterality: Right;  . CYSTOSCOPY/URETEROSCOPY/HOLMIUM LASER/STENT PLACEMENT Bilateral 07/12/2017   Procedure: CYSTOSCOPY/DIAGNOSTIC BILATERAL URETEROSCOPY/BILATERAL STENT PLACEMENT;  Surgeon: Franchot Gallo, MD;  Location: [facility];  Service: Urology;  Laterality: Bilateral;  . CYSTOSCOPY/URETEROSCOPY/HOLMIUM LASER/STENT PLACEMENT Bilateral 09/02/2017   Procedure: CYSTOSCOPY/ URETEROSCOPY/ RETROGRADE PYELOGRAM/ STENT EXCHANGE/ STONE EXTRACTION;  Surgeon: Franchot Gallo, MD;  Location: [facility];  Service: Urology;  Laterality: Bilateral;  . HOLMIUM LASER APPLICATION Left 0/34/7425   Procedure: HOLMIUM LASER APPLICATION;  Surgeon: Franchot Gallo, MD;  Location: [facility];  Service: Urology;  Laterality: Left;  . INTRATHECAL PUMP IMPLANTATION  2010   [employer]:  fentanyl, baclofen changed to morphine and baclofen on 07/2012  . PICC LINE INSERTION     RIGHT ARM    Prior to Admission medications   Medication Sig Start Date End  Date Taking? Authorizing Provider  acetaminophen (TYLENOL) 325 MG tablet Take 2 tablets (650 mg total) by mouth every 4 (four) hours as needed for mild pain (or temp > 37.5 C (99.5 F)). 04/28/17  Yes Emokpae, Courage, MD  Amino Acids-Protein Hydrolys (FEEDING SUPPLEMENT, PRO-STAT SUGAR FREE 64,) LIQD Take 30 mLs by mouth every evening.   Yes [provider]  amLODipine (NORVASC) 10 MG tablet Take 10 mg by mouth daily.   Yes [provider]  aspirin EC 81 MG tablet Take 81 mg by mouth daily.   Yes [provider]  cloNIDine (CATAPRES) 0.1 MG tablet Take 0.1 mg by mouth every 6 (six) hours as needed (for SBP greater than 160).   Yes [provider]  divalproex (DEPAKOTE) 125 MG DR tablet Take 125 mg by mouth 2 (two) times daily.   Yes [provider]  divalproex (DEPAKOTE) 500 MG DR tablet Take 500 mg by mouth 2 (two) times daily.   Yes [provider]  DULoxetine (CYMBALTA) 20 MG capsule Take 20 mg by mouth daily.   Yes [provider]  DULoxetine (CYMBALTA) 60 MG capsule Take 60 mg by mouth daily.   Yes [provider]  furosemide (LASIX) 20 MG tablet Take 20 mg by mouth daily.    Yes [provider]  labetalol (NORMODYNE) 200 MG tablet Take 200 mg by mouth 2 (two) times daily.   Yes [provider]  lactulose, encephalopathy, (CHRONULAC) 10 GM/15ML SOLN Take 20 g by mouth daily.    Yes [provider]  levETIRAcetam (KEPPRA) 500 MG tablet Take 1,000-1,500 mg by mouth See admin instructions. Take 1000 mg by mouth in the morning and take 1500 mg by mouth at bedtime   Yes [provider]  levothyroxine (SYNTHROID, LEVOTHROID) 125 MCG tablet Take 125 mcg by mouth daily before breakfast.   Yes [provider]  lidocaine (LIDODERM) 5 % Place 1 patch onto the skin daily. Remove & Discard patch within 12 hours or as directed by MD   Yes [provider]  Lidocaine-Glycerin (PREPARATION  H) 5-14.4 % CREA Place 1 application rectally every 4 (four) hours as needed (for hemorrhoids).    Yes [provider]  loratadine (CLARITIN) 10 MG tablet Take 10 mg by mouth daily.   Yes [provider]  losartan (COZAAR) 50 MG tablet Take 50 mg by mouth daily.    Yes [provider]  lubiprostone (AMITIZA) 24 MCG capsule Take 24 mcg by mouth 2 (two) times daily with a meal.   Yes [provider]  Menthol, Topical Analgesic, 5 % GEL Apply 1 application topically every 6 (six) hours as needed (for pain).    Yes [provider]  Menthol-Zinc Oxide (CALMOSEPTINE) 0.44-20.6 % OINT Apply 1 application topically 2 (two) times daily.    Yes [provider]  Olopatadine HCl (PATADAY) 0.2 % SOLN Place 1 drop into both eyes every evening.    Yes [provider]  omeprazole (PRILOSEC) 40 MG capsule Take 40 mg by mouth daily.    Yes [provider]  oxyCODONE (OXY IR/ROXICODONE) 5 MG immediate release tablet Take 1 tablet (5  mg total) by mouth every 8 (eight) hours as needed. Patient taking differently: Take 5 mg by mouth every 8 (eight) hours as needed for severe pain.  04/28/17  Yes Emokpae, Courage, MD  Polyethyl Glycol-Propyl Glycol (SYSTANE) 0.4-0.3 % SOLN Place 1 drop into both eyes every 8 (eight) hours as needed (for dry eyes).    Yes [provider]  polyethylene glycol (MIRALAX / GLYCOLAX) packet Take 17 g by mouth 3 (three) times daily.    Yes [provider]  rosuvastatin (CRESTOR) 5 MG tablet Take 5 mg by mouth at bedtime.   Yes [provider]  saccharomyces boulardii (FLORASTOR) 250 MG capsule Take 250 mg by mouth 2 (two) times daily.   Yes [provider]  sennosides-docusate sodium (SENOKOT-S) 8.6-50 MG tablet Take 1 tablet by mouth at bedtime.   Yes [provider]  traZODone (DESYREL) 50 MG tablet Take 50 mg by mouth at bedtime.   Yes [provider]  vitamin C  (ASCORBIC ACID) 500 MG tablet Take 500 mg by mouth daily.   Yes [provider]  zinc sulfate 220 (50 Zn) MG capsule Take 220 mg by mouth daily.   Yes [provider]  ondansetron (ZOFRAN) 4 MG tablet Take 4 mg by mouth every 8 (eight) hours as needed for nausea or vomiting.    [provider]    Allergies as of 01/05/2018 - Review Complete 01/04/2018  Allergen Reaction Noted  . Codeine Other (See Comments) 03/05/2011  . Lacosamide Other (See Comments) 01/17/2015  . Morphine Other (See Comments) 02/06/2015  . Zolpidem Other (See Comments) 02/06/2015    Family History  Problem Relation Age of Onset  . Colon cancer Neg Hx   . Colon polyps Neg Hx     Social History   Socioeconomic History  . Marital status: Married    Spouse name: Not on file  . Number of children: Not on file  . Years of education: Not on file  . Highest education level: Not on file  Occupational History  . Not on file  Social Needs  . Financial resource strain: Not on file  . Food insecurity:    Worry: Not on file    Inability: Not on file  . Transportation needs:    Medical: Not on file    Non-medical: Not on file  Tobacco Use  . Smoking status: Never Smoker  . Smokeless tobacco: Never Used  Substance and Sexual Activity  . Alcohol use: No  . Drug use: No  . Sexual activity: Not Currently  Lifestyle  . Physical activity:    Days per week: Not on file    Minutes per session: Not on file  . Stress: Not on file  Relationships  . Social connections:    Talks on phone: Not on file    Gets together: Not on file    Attends religious service: Not on file    Active member of club or organization: Not on file    Attends meetings of clubs or organizations: Not on file    Relationship status: Not on file  . Intimate partner violence:    Fear of current or ex partner: Not on file    Emotionally abused: Not on file    Physically abused: Not on file    Forced sexual activity:  Not on file  Other Topics Concern  . Not on file  Social History Narrative  . Not on file    Review of  Systems: See HPI, otherwise negative ROS  Physical Exam: BP 138/88   Pulse 79   Temp 99.2 F (37.3 C) (Oral)   Resp 19   Ht 5' (1.524 m)   Wt 98 kg   SpO2 92%   BMI 42.18 kg/m  General:    pleasant and cooperative in NAD SMouth:  No deformity or lesions. Neck:  Supple; no masses or thyromegaly. No significant cervical adenopathy. Lungs:  Clear throughout to auscultation.   No wheezes, crackles, or rhonchi. No acute distress. Heart:  Regular rate and rhythm; no murmurs, clicks, rubs,  or gallops. Abdomen: Non-distended, normal bowel sounds.  Soft and nontender without appreciable mass or hepatosplenomegaly.  Pulses:  Normal pulses noted. Extremities:  Without clubbing or edema.  Impression/Plan: 62 year old debilitated lady with intermittent nausea and vomiting.  Patient denies dysphagia.  EGD now being done to further evaluate her symptoms. The risks, benefits, limitations, alternatives and imponderables have been reviewed with the patient. Potential for esophageal dilation, biopsy, etc. have also been reviewed.  Questions have been answered. All parties agreeable.     Notice: This dictation was prepared with Dragon dictation along with smaller phrase technology. Any transcriptional errors that result from this process are unintentional and may not be corrected upon review.

## 2018-02-10 NOTE — Op Note (Signed)
[facility] Patient Name: [name] Procedure Date: 02/10/2018 11:47 AM MRN: [id number] Date of Birth: Aug 26, 1955 Attending MD: Norvel Richards , MD CSN: [id number] Age: 62 Admit Type: Outpatient Procedure:                Upper GI endoscopy Indications:              Nausea with vomiting Providers:                Norvel Richards, MD, Lurline Del, RN, Rosina Lowenstein, RN Referring MD:              Medicines:                Propofol per Anesthesia Complications:            No immediate complications. Estimated Blood Loss:     Estimated blood loss was minimal. Procedure:                Pre-Anesthesia Assessment:                           - Prior to the procedure, a History and Physical                            was performed, and patient medications and                            allergies were reviewed. The patient's tolerance of                            previous anesthesia was also reviewed. The risks                            and benefits of the procedure and the sedation                            options and risks were discussed with the patient.                            All questions were answered, and informed consent                            was obtained. Prior Anticoagulants: The patient has                            taken no previous anticoagulant or antiplatelet                            agents. ASA Grade Assessment: III - A patient with                            severe systemic disease. After reviewing the risks  and benefits, the patient was deemed in                            satisfactory condition to undergo the procedure.                           After obtaining informed consent, the endoscope was                            passed under direct vision. Throughout the                            procedure, the patient's blood pressure, pulse, and                            oxygen saturations  were monitored continuously. The                            [id number]-H190 ([id number]) scope was introduced through the                            and advanced to the second part of duodenum. The                            upper GI endoscopy was accomplished without                            difficulty. The patient tolerated the procedure                            well. Scope In: 12:03:08 PM Scope Out: 12:17:08 PM Total Procedure Duration: 0 hours 14 minutes 0 seconds  Findings:      The examined esophagus was normal.      Multiple pedunculated and sessile polyps were found in the entire       examined stomach. The largest polyp seen, approximately 1.25 cm in the       antrum, was removed with a hot snare. Resection and retrieval were       complete. Estimated blood loss: none.      The duodenal bulb and second portion of the duodenum were normal.       Polypectomy site closed with hemostasis clips x2. Gastric mucosa was       biopsied separately. Impression:               - Normal esophagus.                           - Multiple gastric polyps. Resected and retrieved.                            Status post gastric biopsy.                           - Normal duodenal bulb and second portion of the  duodenum. Moderate Sedation:      Moderate (conscious) sedation was personally administered by an       anesthesia professional. The following parameters were monitored: oxygen       saturation, heart rate, blood pressure, respiratory rate, EKG, adequacy       of pulmonary ventilation, and response to care. Recommendation:           - Patient has a contact number available for                            emergencies. The signs and symptoms of potential                            delayed complications were discussed with the                            patient. Return to normal activities tomorrow.                            Written discharge instructions were provided to the                             patient.                           - Resume previous diet.                           - Continue present medications. Follow-up on                            pathology.                           - No repeat upper endoscopy.                           - Return to GI office (date not yet determined). Procedure Code(s):        --- Professional ---                           [phone], Esophagogastroduodenoscopy, flexible,                            transoral; with removal of tumor(s), polyp(s), or                            other lesion(s) by snare technique Diagnosis Code(s):        --- Professional ---                           K31.7, Polyp of stomach and duodenum                           R11.2, Nausea with vomiting, unspecified CPT copyright 2018 American Medical Association. All rights reserved. The codes documented in this report are preliminary and upon coder review may  be revised to meet current compliance requirements. Cristopher Estimable. Reyce Lubeck, MD Norvel Richards, MD 02/10/2018 12:23:47 PM This report has been signed electronically. Number of Addenda: 0

## 2018-02-10 NOTE — Anesthesia Postprocedure Evaluation (Signed)
Anesthesia Post Note  Patient: [name]  Procedure(s) Performed: ESOPHAGOGASTRODUODENOSCOPY (EGD) WITH PROPOFOL (N/A ) MALONEY DILATION (N/A )  Patient location during evaluation: PACU Anesthesia Type: MAC Level of consciousness: awake and alert and oriented Pain management: pain level controlled Vital Signs Assessment: post-procedure vital signs reviewed and stable Respiratory status: spontaneous breathing Cardiovascular status: stable Postop Assessment: no apparent nausea or vomiting Anesthetic complications: no     Last Vitals:  Vitals:   02/10/18 1055 02/10/18 1231  BP: 138/88 100/68  Pulse: 79 78  Resp: 19 13  Temp: 37.3 C 36.8 C  SpO2: 92% 94%    Last Pain:  Vitals:   02/10/18 1055  TempSrc: Oral  PainSc: 0-No pain                 Mannie Ohlin A

## 2018-02-10 NOTE — Anesthesia Procedure Notes (Signed)
Procedure Name: MAC Date/Time: 02/10/2018 12:20 PM Performed by: Andree Elk Amy A, CRNA Pre-anesthesia Checklist: Patient identified, Emergency Drugs available, Suction available, Timeout performed and Patient being monitored Patient Re-evaluated:Patient Re-evaluated prior to induction Oxygen Delivery Method: Non-rebreather mask

## 2018-02-10 NOTE — Transfer of Care (Signed)
Immediate Anesthesia Transfer of Care Note  Patient: [name]  Procedure(s) Performed: ESOPHAGOGASTRODUODENOSCOPY (EGD) WITH PROPOFOL (N/A ) MALONEY DILATION (N/A )  Patient Location: PACU  Anesthesia Type:MAC  Level of Consciousness: awake, alert , oriented and patient cooperative  Airway & Oxygen Therapy: Patient Spontanous Breathing and Patient connected to nasal cannula oxygen  Post-op Assessment: Report given to RN and Post -op Vital signs reviewed and stable  Post vital signs: Reviewed and stable  Last Vitals:  Vitals Value Taken Time  BP 100/68 02/10/2018 12:31 PM  Temp    Pulse 79 02/10/2018 12:34 PM  Resp 14 02/10/2018 12:34 PM  SpO2 98 % 02/10/2018 12:34 PM  Vitals shown include unvalidated device data.  Last Pain:  Vitals:   02/10/18 1055  TempSrc: Oral  PainSc: 0-No pain      Patients Stated Pain Goal: 5 (57/97/28 2060)  Complications: No apparent anesthesia complications

## 2018-02-11 ENCOUNTER — Ambulatory Visit: Payer: Medicare Other | Admitting: Gastroenterology

## 2018-02-13 ENCOUNTER — Encounter: Payer: Self-pay | Admitting: Internal Medicine

## 2018-02-15 ENCOUNTER — Encounter (HOSPITAL_COMMUNITY): Payer: Self-pay | Admitting: Internal Medicine

## 2018-03-14 ENCOUNTER — Encounter: Payer: Self-pay | Admitting: Gastroenterology

## 2018-03-14 ENCOUNTER — Ambulatory Visit (INDEPENDENT_AMBULATORY_CARE_PROVIDER_SITE_OTHER): Payer: Medicare Other | Admitting: Gastroenterology

## 2018-03-14 VITALS — BP 101/66 | HR 68 | Temp 98.0°F | Ht 66.0 in | Wt 260.0 lb

## 2018-03-14 DIAGNOSIS — K5900 Constipation, unspecified: Secondary | ICD-10-CM | POA: Diagnosis not present

## 2018-03-14 DIAGNOSIS — R112 Nausea with vomiting, unspecified: Secondary | ICD-10-CM

## 2018-03-14 NOTE — Patient Instructions (Signed)
I am glad you are doing better!  No changes to medications at this time.  We will see you in 6 months or sooner if needed!  I enjoyed seeing you again today! As you know, I value our relationship and want to provide genuine, compassionate, and quality care. I welcome your feedback. If you receive a survey regarding your visit,  I greatly appreciate you taking time to fill this out. See you next time!  Annitta Needs, PhD, ANP-BC [facility]

## 2018-03-14 NOTE — Progress Notes (Signed)
Referring Provider: Hilbert Corrigan* Primary Care Physician:  Hilbert Corrigan, MD  Primary GI: Dr. Gala Romney   Chief Complaint  Patient presents with  . nausea w/ vomiting    has improved since last OV. Had 1 episode last week    HPI:   [name] "[name]" is a 63 y.o. female presenting today with a history of constipation, intermittent N/V, recently undergoing EGD Dec 2019 with  normal esophagus, multiple gastric polyps, normal duodenum, hyperplastic polyps, reactive gastropathy.   History of constipation with Linzess 145 daily, Miralax TID, and Senna at bedtime in the past. Changed to Amitiza 24 mcg BID at last visit.   She is doing well. Constipation well managed. Had one episode of N/V last week. Otherwise, she has done well since last visit in Oct 2019. Remains on Prilosec.   Past Medical History:  Diagnosis Date  . Acute kidney failure (HCC)    hx of   . Acute respiratory failure ([place]) 07/27/2012  . Arthritis    OSTEO LEFT LEG  . Bilateral cataracts   . Candidiasis of skin and nail   . Chronic pain   . Chronic pain syndrome   . Constipation   . Contractures involving both knees    and ankles   . CVA (cerebral vascular accident) ([place])    left sided hemiparesis  . Depression   . Diabetes mellitus without complication (HCC)    type II, DIET CONTROLLED  . Dry eye syndrome of bilateral lacrimal glands   . Dysphagia   . Feeding difficulties   . 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 GERD (gastroesophageal reflux disease)   . Hemiparesis affecting left side as late effect of cerebrovascular accident ([place])   . History of kidney stones   . Hyperlipidemia   . Hypertension   . Metabolic encephalopathy 14/12/3011  . Morbid obesity ([place])   . Muscle weakness (generalized)   . Narcotic overdose ([place]) 07/27/2012  . Parkinson's disease ([place])   . Peripheral vascular disease ([place])   . Polymyalgia rheumatica ([place])   . Primary insomnia   . Respiratory failure  ([place]) 05/06/10  . Seizure disorder ([place])   . Sepsis ([place])    hx of   . Sleep apnea 04/2010   on CPAP, "severe central sleep apnea"  . Spasticity    chronic  . Thrombocytopenia ([place])    related to depakote  . Thrombocytopenia ([place])   . Tinea unguium   . Unspecified hypothyroidism   . 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 Urinary incontinence   . UTI (lower urinary tract infection) 05/06/10    Past Surgical History:  Procedure Laterality Date  . CEREBRAL ANEURYSM REPAIR  1999  . CYSTOSCOPY W/ URETERAL STENT PLACEMENT Bilateral 04/22/2017   Procedure: CYSTOSCOPY WITH BILATERAL  RETROGRADE PYELOGRAM, RIGHT URETERAL STENT PLACEMENT;  Surgeon: Festus Aloe, MD;  Location: [facility];  Service: Urology;  Laterality: Bilateral;  . CYSTOSCOPY W/ URETERAL STENT REMOVAL Right 05/17/2017   Procedure: CYSTOSCOPY WITH STENT REMOVAL;  Surgeon: Franchot Gallo, MD;  Location: [facility];  Service: Urology;  Laterality: Right;  . CYSTOSCOPY W/ URETERAL STENT REMOVAL Bilateral 07/12/2017   Procedure: CYSTOSCOPY WITH STENT EXTRACTION;  Surgeon: Franchot Gallo, MD;  Location: [facility];  Service: Urology;  Laterality: Bilateral;  . CYSTOSCOPY/URETEROSCOPY/HOLMIUM LASER/STENT PLACEMENT Right 05/17/2017   Procedure: CYSTOSCOPY/URETEROSCOPY/HOLMIUM LASER/STENT PLACEMENT/BILATERAL STENT PLACEMENTS ,RIGHT STONE EXTRACTION;  Surgeon: Diona Fanti,  Annie Main, MD;  Location: [facility];  Service: Urology;  Laterality: Right;  . CYSTOSCOPY/URETEROSCOPY/HOLMIUM LASER/STENT PLACEMENT Bilateral 07/12/2017   Procedure: CYSTOSCOPY/DIAGNOSTIC BILATERAL URETEROSCOPY/BILATERAL STENT PLACEMENT;  Surgeon: Franchot Gallo, MD;  Location: [facility];  Service: Urology;  Laterality: Bilateral;  . CYSTOSCOPY/URETEROSCOPY/HOLMIUM LASER/STENT PLACEMENT Bilateral 09/02/2017   Procedure: CYSTOSCOPY/ URETEROSCOPY/ RETROGRADE PYELOGRAM/ STENT EXCHANGE/ STONE EXTRACTION;   Surgeon: Franchot Gallo, MD;  Location: [facility];  Service: Urology;  Laterality: Bilateral;  . ESOPHAGOGASTRODUODENOSCOPY (EGD) WITH PROPOFOL N/A 02/10/2018   Normal esophagus, multiple gastric polyps, normal duodenum, hyperplastic polyps, reactive gastropathy  . HOLMIUM LASER APPLICATION Left 7/82/9562   Procedure: HOLMIUM LASER APPLICATION;  Surgeon: Franchot Gallo, MD;  Location: [facility];  Service: Urology;  Laterality: Left;  . INTRATHECAL PUMP IMPLANTATION  2010   [employer]:  fentanyl, baclofen changed to morphine and baclofen on 07/2012  . MALONEY DILATION N/A 02/10/2018   Procedure: Venia Minks DILATION;  Surgeon: Daneil Dolin, MD;  [facility];  Service: Endoscopy;  Laterality: N/A;  . PICC LINE INSERTION     RIGHT ARM    Current Outpatient Medications  Medication Sig Dispense Refill  . acetaminophen (TYLENOL) 325 MG tablet Take 2 tablets (650 mg total) by mouth every 4 (four) hours as needed for mild pain (or temp > 37.5 C (99.5 F)). 30 tablet 2  . Amino Acids-Protein Hydrolys (FEEDING SUPPLEMENT, PRO-STAT SUGAR FREE 64,) LIQD Take 30 mLs by mouth every evening.    Marland Kitchen amLODipine (NORVASC) 10 MG tablet Take 10 mg by mouth daily.    Marland Kitchen aspirin EC 81 MG tablet Take 81 mg by mouth daily.    . cloNIDine (CATAPRES) 0.1 MG tablet Take 0.1 mg by mouth every 6 (six) hours as needed (for SBP greater than 160).    Marland Kitchen divalproex (DEPAKOTE) 125 MG DR tablet Take 125 mg by mouth 2 (two) times daily.    . divalproex (DEPAKOTE) 500 MG DR tablet Take 500 mg by mouth 2 (two) times daily.    . DULoxetine (CYMBALTA) 20 MG capsule Take 20 mg by mouth daily.    . DULoxetine (CYMBALTA) 60 MG capsule Take 60 mg by mouth daily.    . furosemide (LASIX) 20 MG tablet Take 20 mg by mouth daily.     Marland Kitchen labetalol (NORMODYNE) 200 MG tablet Take 200 mg by mouth 2 (two) times daily.    Marland Kitchen lactulose, encephalopathy, (CHRONULAC) 10 GM/15ML SOLN Take 30 g by mouth daily.     Marland Kitchen levETIRAcetam (KEPPRA) 500 MG  tablet Take 1,000-1,500 mg by mouth See admin instructions. Take 1000 mg by mouth in the morning and take 1500 mg by mouth at bedtime    . levothyroxine (SYNTHROID, LEVOTHROID) 125 MCG tablet Take 125 mcg by mouth daily before breakfast.    . lidocaine (LIDODERM) 5 % Place 1 patch onto the skin daily. Remove & Discard patch within 12 hours or as directed by MD    . Lidocaine-Glycerin (PREPARATION H) 5-14.4 % CREA Place 1 application rectally every 4 (four) hours as needed (for hemorrhoids).     . loratadine (CLARITIN) 10 MG tablet Take 10 mg by mouth daily.    Marland Kitchen losartan (COZAAR) 50 MG tablet Take 50 mg by mouth daily.     Marland Kitchen lubiprostone (AMITIZA) 24 MCG capsule Take 24 mcg by mouth 2 (two) times daily with a meal.    . Menthol, Topical Analgesic, 5 % GEL Apply 1 application topically every 6 (six) hours as needed (for pain).     Marland Kitchen  Menthol-Zinc Oxide (CALMOSEPTINE) 0.44-20.6 % OINT Apply 1 application topically 2 (two) times daily.     . Multiple Vitamin (MULTIVITAMIN) tablet Take 1 tablet by mouth daily.    . Olopatadine HCl (PATADAY) 0.2 % SOLN Place 1 drop into both eyes every evening.     Marland Kitchen omeprazole (PRILOSEC) 40 MG capsule Take 40 mg by mouth daily.     . ondansetron (ZOFRAN) 4 MG tablet Take 4 mg by mouth every 8 (eight) hours as needed for nausea or vomiting.    Marland Kitchen oxycodone (OXY-IR) 5 MG capsule Take 5 mg by mouth every 8 (eight) hours.    Vladimir Faster Glycol-Propyl Glycol (SYSTANE) 0.4-0.3 % SOLN Place 1 drop into both eyes every 8 (eight) hours as needed (for dry eyes).     . rosuvastatin (CRESTOR) 5 MG tablet Take 5 mg by mouth at bedtime.    . saccharomyces boulardii (FLORASTOR) 250 MG capsule Take 250 mg by mouth 2 (two) times daily.    . sennosides-docusate sodium (SENOKOT-S) 8.6-50 MG tablet Take 1 tablet by mouth at bedtime.    . traZODone (DESYREL) 50 MG tablet Take 50 mg by mouth at bedtime.    . vitamin C (ASCORBIC ACID) 500 MG tablet Take 500 mg by mouth daily.    Marland Kitchen zinc  sulfate 220 (50 Zn) MG capsule Take 220 mg by mouth daily.     No current facility-administered medications for this visit.     Allergies as of 03/14/2018 - Review Complete 03/14/2018  Allergen Reaction Noted  . Codeine Other (See Comments) 03/05/2011  . Lacosamide Other (See Comments) 01/17/2015  . Morphine Other (See Comments) 02/06/2015  . Zolpidem Other (See Comments) 02/06/2015    Family History  Problem Relation Age of Onset  . Colon cancer Neg Hx   . Colon polyps Neg Hx     Social History   Socioeconomic History  . Marital status: Married    Spouse name: Not on file  . Number of children: Not on file  . Years of education: Not on file  . Highest education level: Not on file  Occupational History  . Not on file  Social Needs  . Financial resource strain: Not on file  . Food insecurity:    Worry: Not on file    Inability: Not on file  . Transportation needs:    Medical: Not on file    Non-medical: Not on file  Tobacco Use  . Smoking status: Never Smoker  . Smokeless tobacco: Never Used  Substance and Sexual Activity  . Alcohol use: No  . Drug use: No  . Sexual activity: Not Currently  Lifestyle  . Physical activity:    Days per week: Not on file    Minutes per session: Not on file  . Stress: Not on file  Relationships  . Social connections:    Talks on phone: Not on file    Gets together: Not on file    Attends religious service: Not on file    Active member of club or organization: Not on file    Attends meetings of clubs or organizations: Not on file    Relationship status: Not on file  Other Topics Concern  . Not on file  Social History Narrative  . Not on file    Review of Systems: Gen: Denies fever, chills, anorexia. Denies fatigue, weakness, weight loss.  CV: Denies chest pain, palpitations, syncope, peripheral edema, and claudication. Resp: Denies dyspnea at rest, cough, wheezing, coughing  up blood, and pleurisy. GI: see HPI  Derm:  Denies rash, itching, dry skin Psych: Denies depression, anxiety, memory loss, confusion. No homicidal or suicidal ideation.  Heme: Denies bruising, bleeding, and enlarged lymph nodes.  Physical Exam: BP 101/66   Pulse 68   Temp 98 F (36.7 C) (Oral)   Ht 5\' 6"  (1.676 m)   Wt 260 lb (117.9 kg)   BMI 41.97 kg/m  General:   Alert and oriented. No distress noted. Pleasant and cooperative.  Head:  Normocephalic and atraumatic. Eyes:  Conjuctiva clear without scleral icterus. Mouth:  Oral mucosa pink and moist.  Abdomen:  +BS, soft, non-tender and non-distended. No rebound or guarding.   Msk:  Left-sided hemiparesis Neurologic:  Alert and  oriented x4 Psych:Normal mood and affect.

## 2018-03-21 ENCOUNTER — Encounter: Payer: Self-pay | Admitting: Gastroenterology

## 2018-03-21 NOTE — Assessment & Plan Note (Signed)
Much improved. EGD on file. Continue PPI daily. Return in 6 months or sooner if needed.

## 2018-03-21 NOTE — Assessment & Plan Note (Addendum)
Doing well with Amitiza 24 mcg BID. Continue current regimen. Return in 6 months or sooner if needed.

## 2018-03-21 NOTE — Progress Notes (Signed)
CC'D TO PCP °

## 2018-07-27 ENCOUNTER — Ambulatory Visit (HOSPITAL_COMMUNITY): Payer: Medicare Other | Admitting: Hematology

## 2018-07-27 ENCOUNTER — Other Ambulatory Visit (HOSPITAL_COMMUNITY): Payer: Medicare Other

## 2018-09-01 ENCOUNTER — Inpatient Hospital Stay (HOSPITAL_COMMUNITY): Payer: Medicare Other | Attending: Hematology | Admitting: Hematology

## 2018-09-01 ENCOUNTER — Other Ambulatory Visit (HOSPITAL_COMMUNITY): Payer: Medicare Other

## 2018-09-14 ENCOUNTER — Ambulatory Visit: Payer: Medicare Other | Admitting: Gastroenterology

## 2018-09-14 ENCOUNTER — Encounter: Payer: Self-pay | Admitting: Gastroenterology

## 2018-09-14 ENCOUNTER — Telehealth: Payer: Self-pay | Admitting: Gastroenterology

## 2018-09-14 NOTE — Telephone Encounter (Signed)
Patient was a no show and letter sent  °

## 2019-06-30 IMAGING — CT CT HEAD CODE STROKE
3 of 6 series · 15 of 47 positions shown, 18 images · non-contrast
Comparison: CT head without contrast 09/16/2012

CLINICAL DATA: Code stroke.  Code stroke.  Abnormal speech.

EXAM:
CT HEAD WITHOUT CONTRAST
TECHNIQUE: Contiguous axial images were obtained from the base of the skull
through the vertex without intravenous contrast.

[Series 2: head code stroke wo · axial · 0.47mm/px · z∈[+9,+154]mm · 10 of 33 slices shown, 13 images]
[im 2/33  brain]
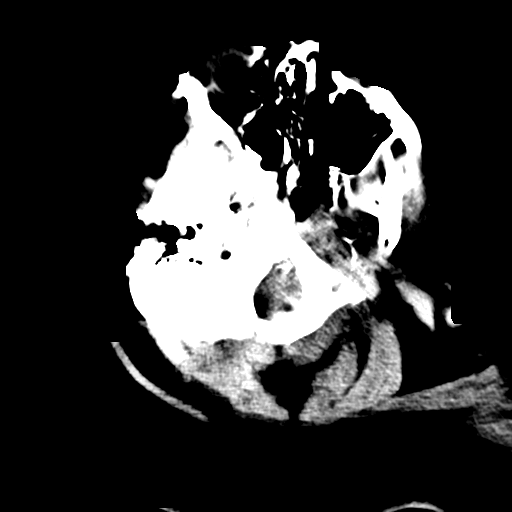
[im 2/33  bone]
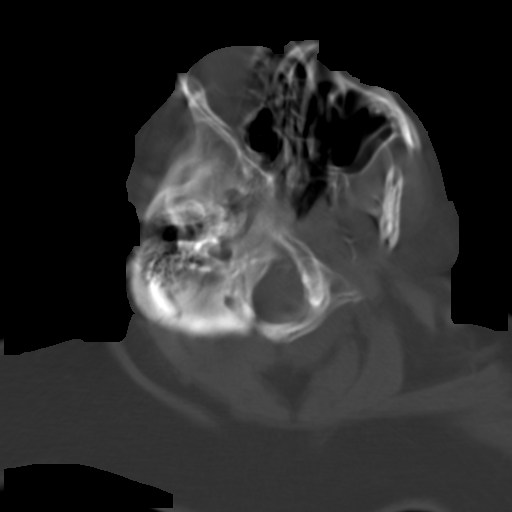
[im 5/33  brain]
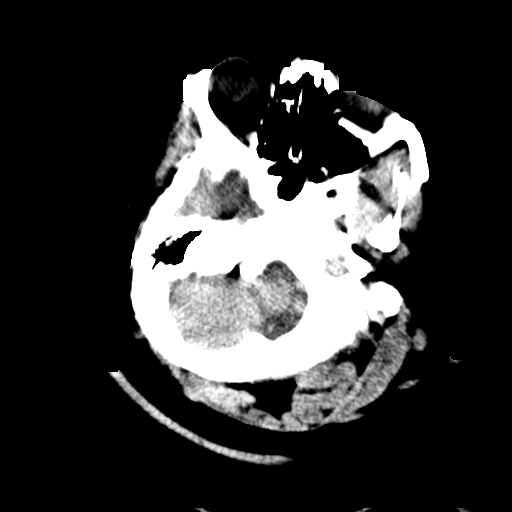
[im 9/33  brain]
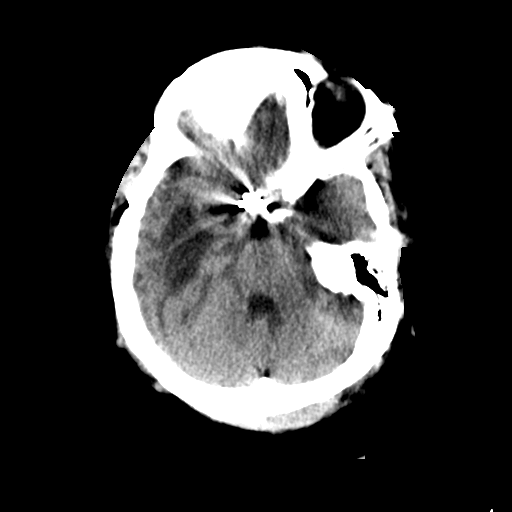
[im 12/33  brain]
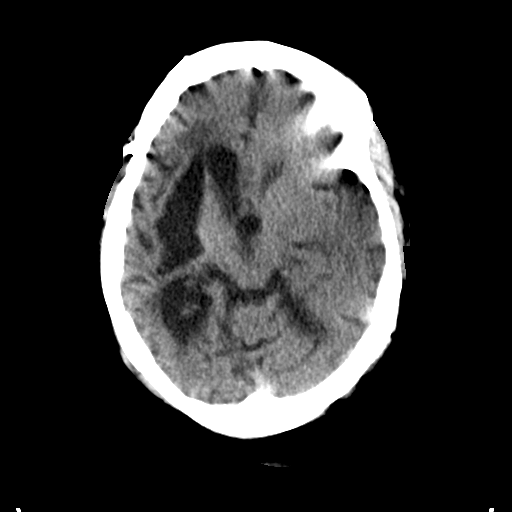
[im 15/33  brain]
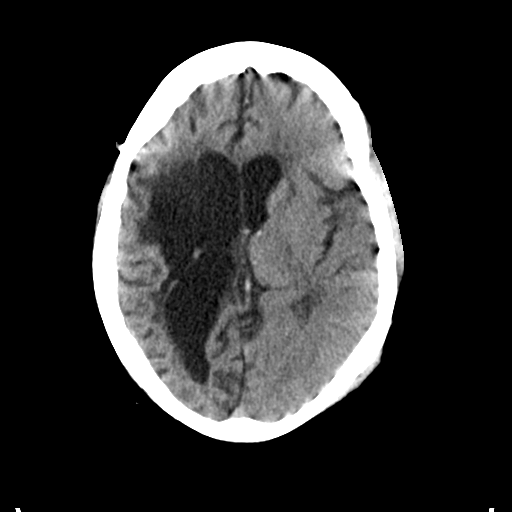
[im 15/33  bone]
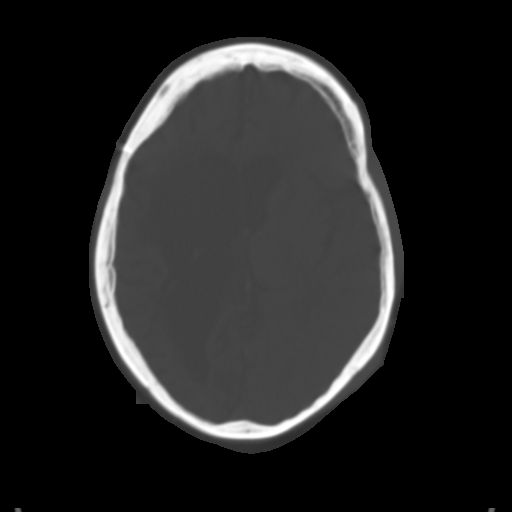
[im 18/33  brain]
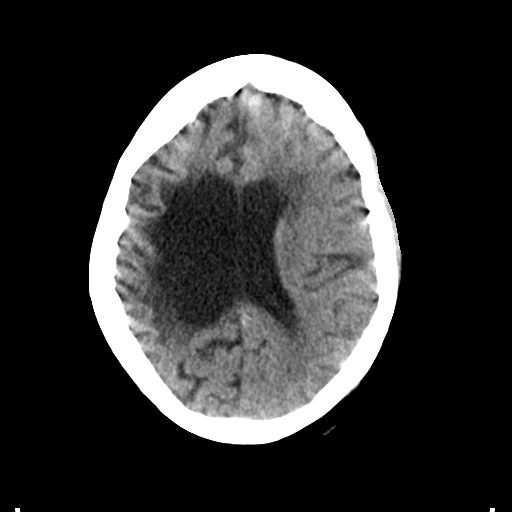
[im 21/33  brain]
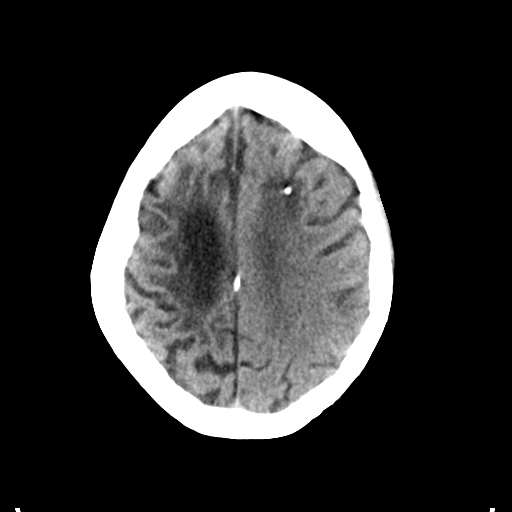
[im 25/33  brain]
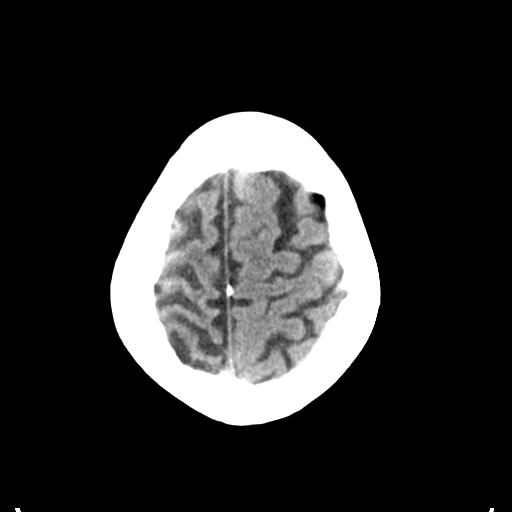
[im 28/33  brain]
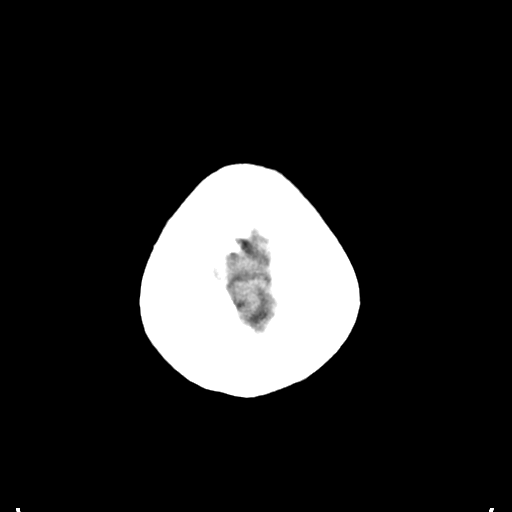
[im 28/33  bone]
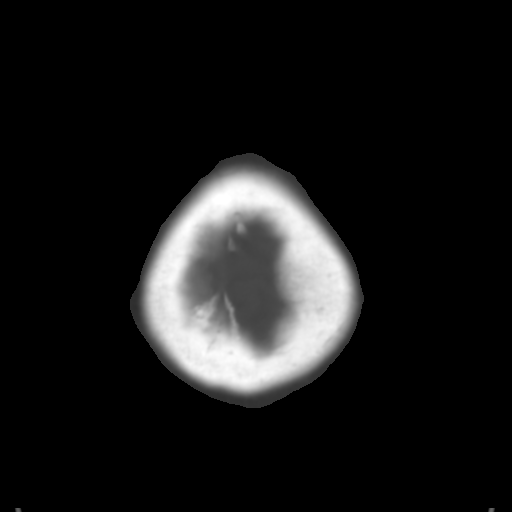
[im 31/33  brain]
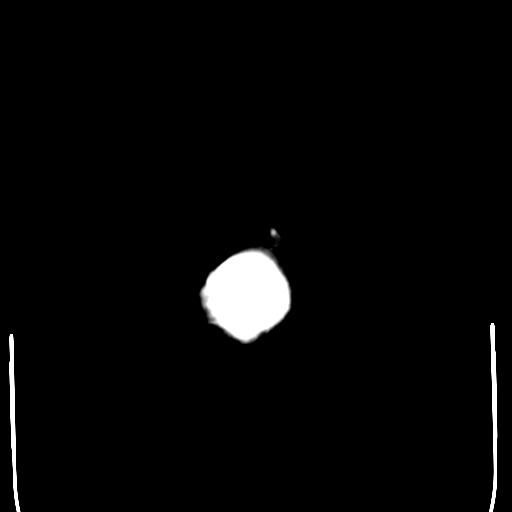

[Series 4: coronal soft tissue · coronal · 0.33mm/px · 3 of 69 slices shown]
[im 18/69  brain]
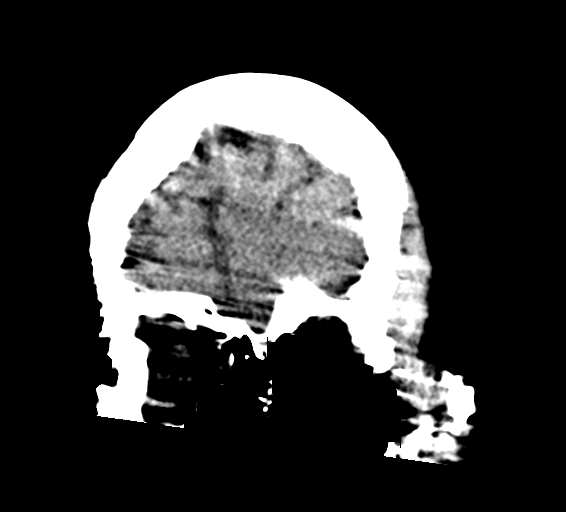
[im 35/69  brain]
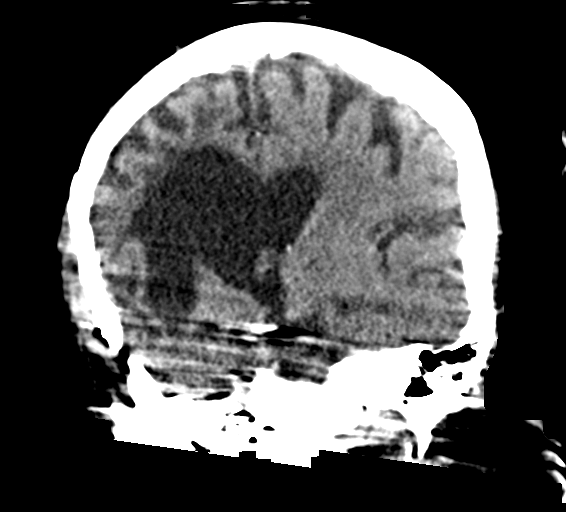
[im 52/69  brain]
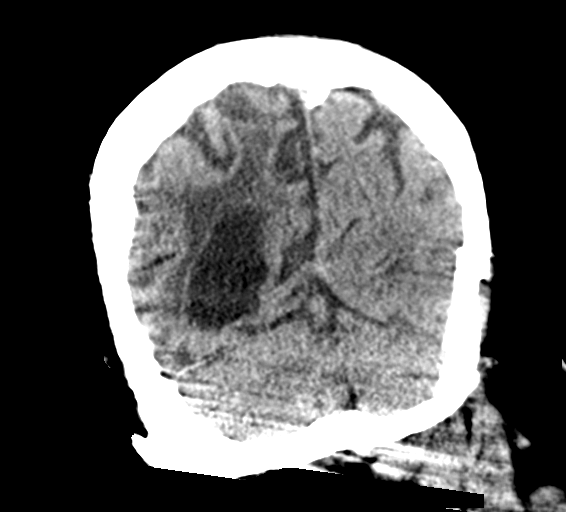

[Series 5: sagittal soft tissue · sagittal · 0.33mm/px · 2 of 65 slices shown]
[im 22/65  brain]
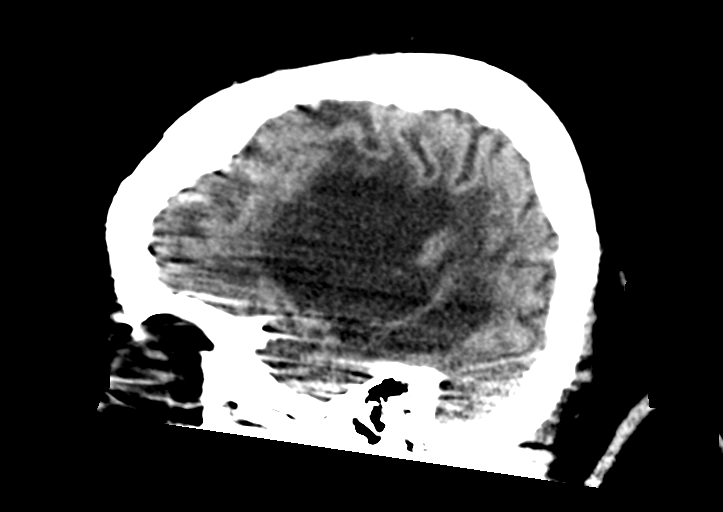
[im 43/65  brain]
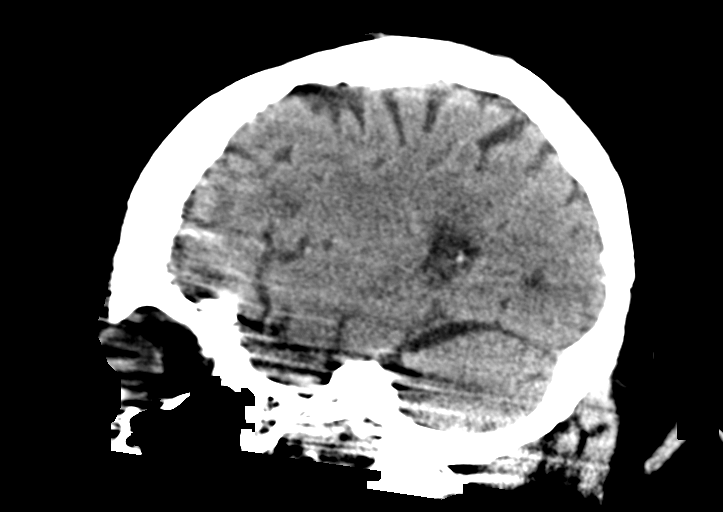

[15 of 47 positions shown; findings below may reference images not displayed]

FINDINGS: Brain: Remote right MCA territory encephalomalacia is stable. There
is ex vacuo dilation of the right lateral ventricle. Right
paraophthalmic aneurysm clip is noted. Left frontal encephalomalacia
is stable. Density within the left frontal white matter is stable. A
remote lacunar infarct of the anterior limb left internal capsule is
stable.

No acute infarct is present. Left insular ribbon is normal. No focal
cortical abnormality is present.

Vascular: Ventricles are of normal size. Ex vacuo dilation of the
right lateral ventricle is stable.

Skull: Calvarium is intact. No focal lytic or blastic lesions are
present.

Sinuses/Orbits: The paranasal sinuses and mastoid air cells are
clear. Globes and orbits are unremarkable.

ASPECTS ([place] Stroke Program Early CT Score)

- Ganglionic level infarction (caudate, lentiform nuclei, internal
capsule, insula, M1-M3 cortex): [DATE]

- Supraganglionic infarction (M4-M6 cortex): [DATE]

Total score (0-10 with 10 being normal): [DATE]
IMPRESSION: 1. No acute intracranial abnormality or significant interval change.
2. Stable chronic encephalomalacia of the right MCA territory and
anterior left frontal lobe.
3. Right ICA aneurysm clip.
4. ASPECTS is [DATE]

These results were called by telephone at the time of interpretation
on 04/21/2017 at [DATE] to Dr. BISMARCK MAKKI , who verbally
acknowledged these results.

## 2019-11-17 IMAGING — US US RENAL
1 series · 14 of 25 positions shown · non-contrast
Comparison: Ultrasound 04/21/2017.

CLINICAL DATA: Acute renal injury.

EXAM:
RENAL / URINARY TRACT ULTRASOUND COMPLETE

[Series 1: us renal · 0.27mm/px · 14 of 106 slices shown]
[im 1/106]
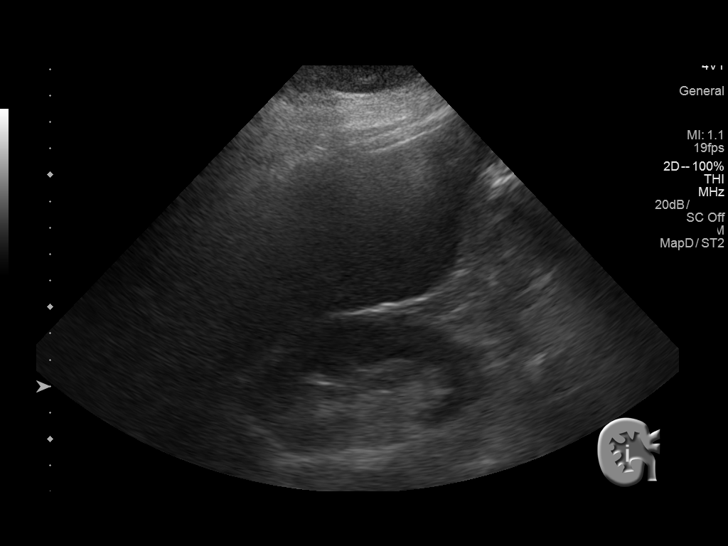
[im 9/106]
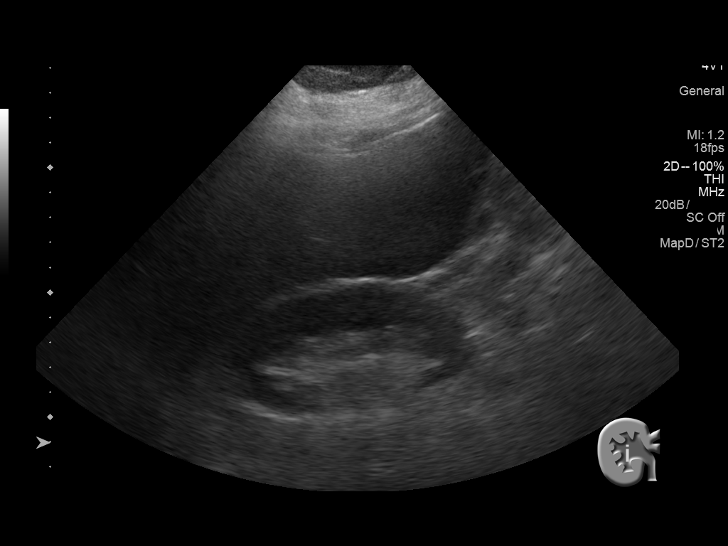
[im 18/106]
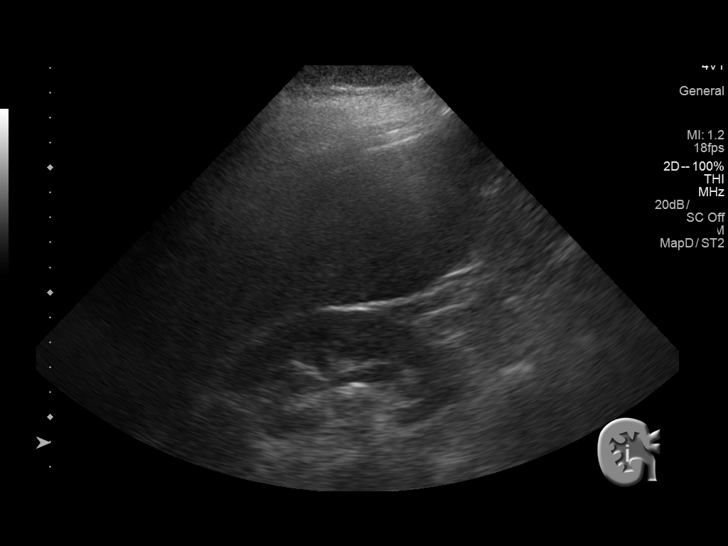
[im 27/106]
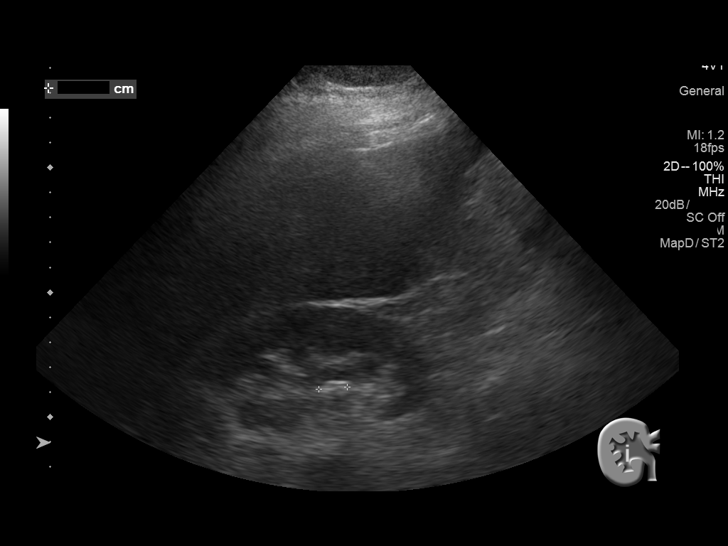
[im 36/106]
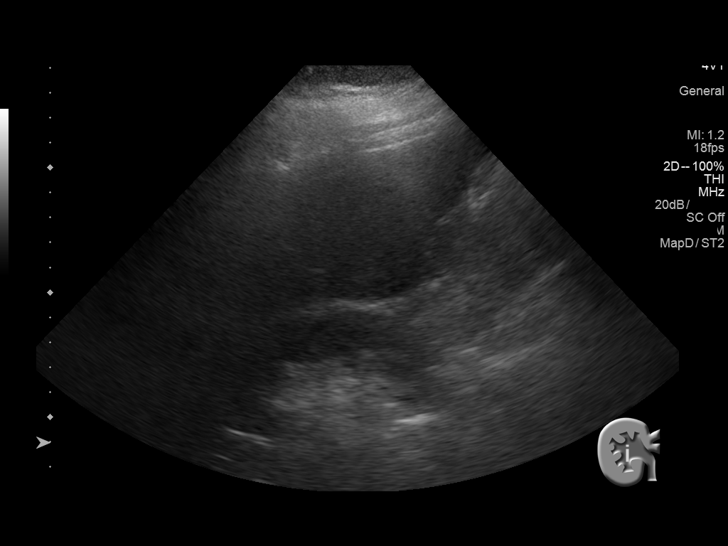
[im 40/106]
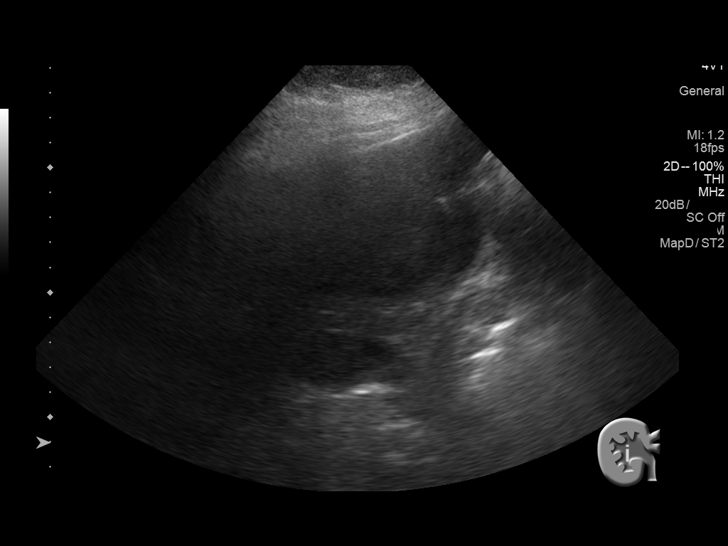
[im 49/106]
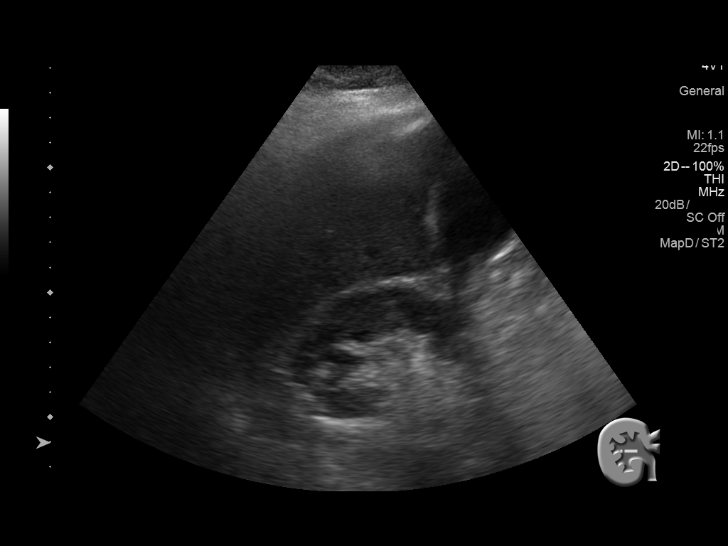
[im 57/106]
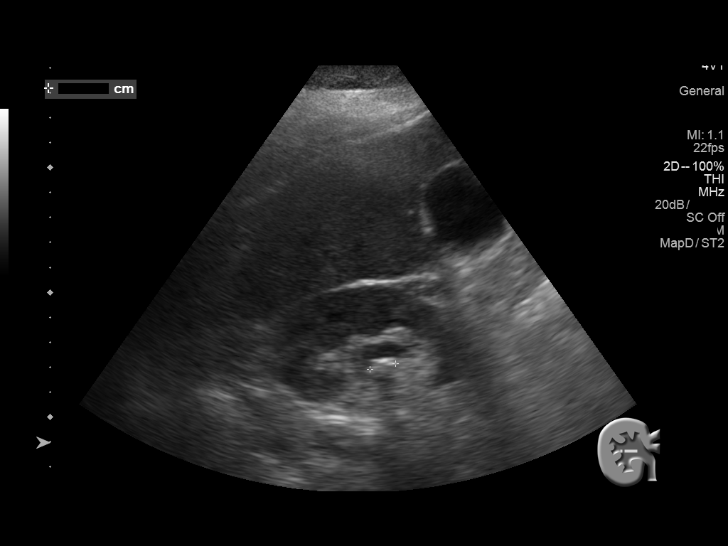
[im 66/106]
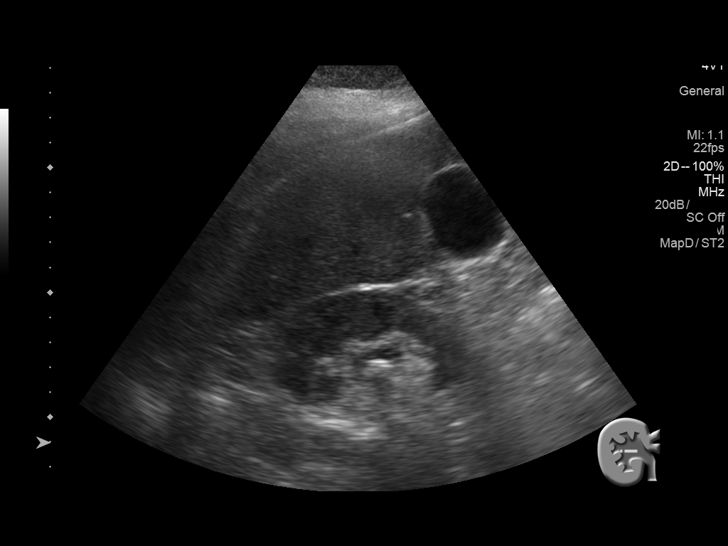
[im 71/106]
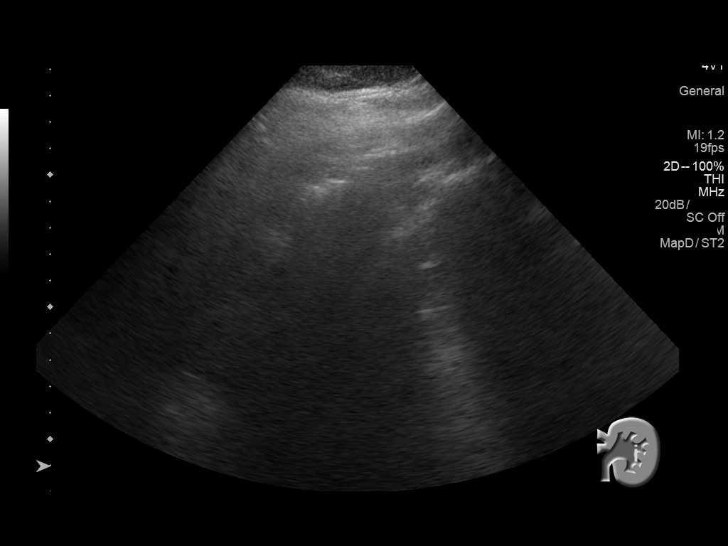
[im 79/106]
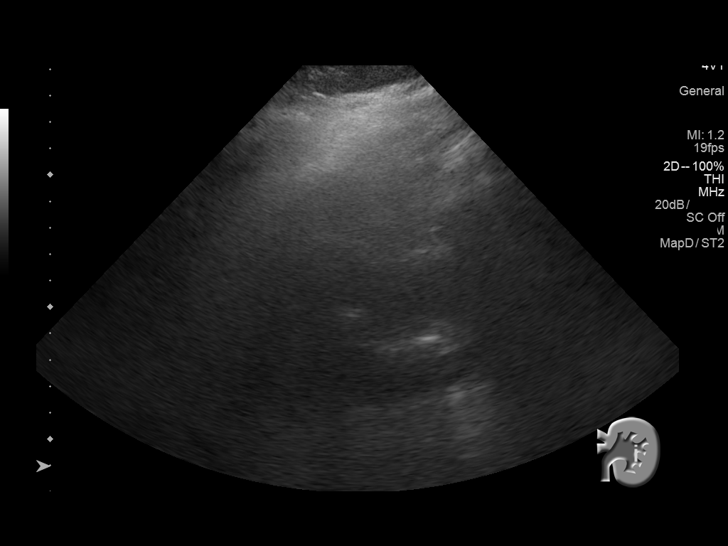
[im 88/106]
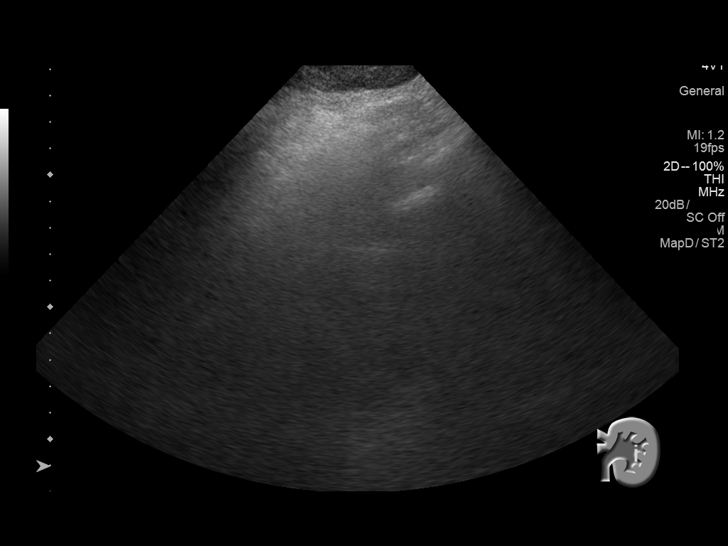
[im 97/106]
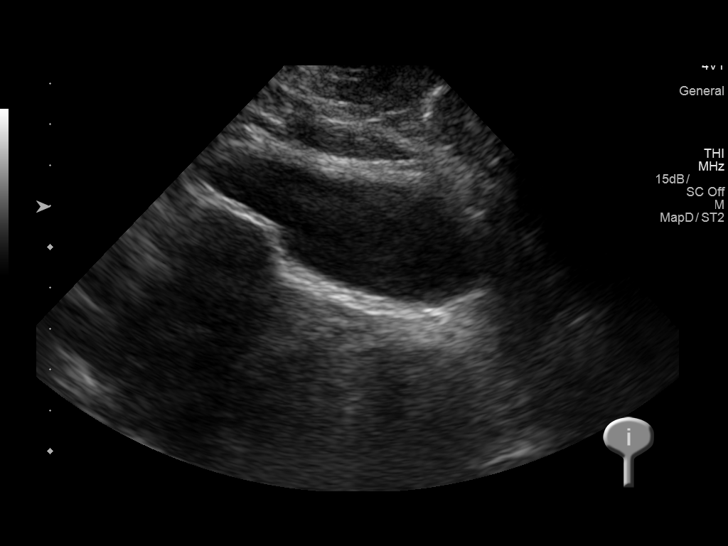
[im 106/106]
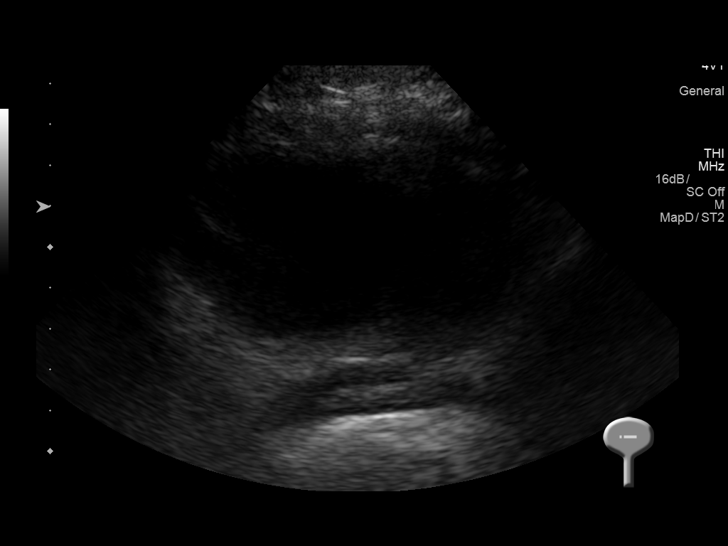

[14 of 25 positions shown; findings below may reference images not displayed]

FINDINGS: Right Kidney:

Length: 10.2 cm. Echogenicity within normal limits. No mass. Mild
hydronephrosis. 11 mm renal pelvic stone.

Left Kidney:

Length: 10.6 cm. Echogenicity within normal limits. No mass or
hydronephrosis visualized. Limited evaluation due to overlying bowel
gas, patient's body habitus, and positioning difficulty.

Bladder: Bladder is nondistended. By history the patient has a Foley
catheter.
IMPRESSION: 1. 11 mm right renal pelvic stone. Mild hydronephrosis. Stable exam.
No change from prior study of 04/21/2017.

2. Limited evaluation of the left kidney due to overlying bowel gas,
patient's body habitus, and positioning difficulty.

## 2021-12-01 ENCOUNTER — Ambulatory Visit: Payer: Medicare Other | Admitting: Physician Assistant

## 2021-12-03 ENCOUNTER — Ambulatory Visit: Payer: Medicare Other | Admitting: Physician Assistant

## 2021-12-15 ENCOUNTER — Ambulatory Visit (INDEPENDENT_AMBULATORY_CARE_PROVIDER_SITE_OTHER): Payer: Medicare Other | Admitting: Physician Assistant

## 2021-12-15 VITALS — BP 108/76 | HR 73 | Ht 66.0 in | Wt 260.0 lb

## 2021-12-15 DIAGNOSIS — I69354 Hemiplegia and hemiparesis following cerebral infarction affecting left non-dominant side: Secondary | ICD-10-CM

## 2021-12-15 DIAGNOSIS — R109 Unspecified abdominal pain: Secondary | ICD-10-CM

## 2021-12-15 DIAGNOSIS — Z87440 Personal history of urinary (tract) infections: Secondary | ICD-10-CM

## 2021-12-15 DIAGNOSIS — N200 Calculus of kidney: Secondary | ICD-10-CM

## 2021-12-15 DIAGNOSIS — Z87448 Personal history of other diseases of urinary system: Secondary | ICD-10-CM

## 2021-12-15 DIAGNOSIS — R3129 Other microscopic hematuria: Secondary | ICD-10-CM | POA: Diagnosis not present

## 2021-12-15 NOTE — Progress Notes (Signed)
12/15/2021 10:49 AM   [name] 1956-01-30 [id number]   Assessment:  There are no diagnoses linked to this encounter.   Plan: ***  No orders of the defined types were placed in this encounter.     Chief Complaint: ***  Referring provider: Hilbert Corrigan, MD [address] [place],  [place] [address]   History of Present Illness:  [name] is a 66 y.o. year old female who is seen in consultation from Rita Corrigan, MD  for evaluation of ***. [place] due to hemiplegia and immobility. Number of pads/day= Soda intake= UA= PVR=  Portions of the above documentation were copied from a prior visit for review purposes only. Medical records including notes, lab results, and imaging studies reviewed during pt OV.  Past Medical History:  Past Medical History:  Diagnosis Date   Acute kidney failure (HCC)    hx of    Acute respiratory failure ([place]) 07/27/2012   Arthritis    OSTEO LEFT LEG   Bilateral cataracts    Candidiasis of skin and nail    Chronic pain    Chronic pain syndrome    Constipation    Contractures involving both knees    and ankles    CVA (cerebral vascular accident) ([place])    left sided hemiparesis   Depression    Diabetes mellitus without complication ([place])    type II, DIET CONTROLLED   Dry eye syndrome of bilateral lacrimal glands    Dysphagia    Feeding difficulties    Flaccid hemiplegia (HCC)    GERD (gastroesophageal reflux disease)    Hemiparesis affecting left side as late effect of cerebrovascular accident ([place])    History of kidney stones    Hyperlipidemia    Hypertension    Metabolic encephalopathy 20/94/7096   Morbid obesity ([place])    Muscle weakness (generalized)    Narcotic overdose ([place]) 07/27/2012   Parkinson's disease ([place])    Peripheral vascular disease ([place])    Polymyalgia rheumatica ([place])    Primary insomnia    Respiratory failure ([place]) 05/06/10   Seizure disorder ([place])    Sepsis ([place])     hx of    Sleep apnea 04/2010   on CPAP, "severe central sleep apnea"   Spasticity    chronic   Thrombocytopenia (HCC)    related to depakote   Thrombocytopenia (HCC)    Tinea unguium    Unspecified hypothyroidism    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/14/10   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[place])    Urinary incontinence    UTI (lower urinary tract infection) 05/06/10    Past Surgical History:  Past Surgical History:  Procedure Laterality Date   CEREBRAL ANEURYSM REPAIR  1999   CYSTOSCOPY W/ URETERAL STENT PLACEMENT Bilateral 04/22/2017   Procedure: CYSTOSCOPY WITH BILATERAL  RETROGRADE PYELOGRAM, RIGHT URETERAL STENT PLACEMENT;  Surgeon: Festus Aloe, MD;  Location: [facility];  Service: Urology;  Laterality: Bilateral;   CYSTOSCOPY W/ URETERAL STENT REMOVAL Right 05/17/2017   Procedure: CYSTOSCOPY WITH STENT REMOVAL;  Surgeon: Franchot Gallo, MD;  Location: [facility];  Service: Urology;  Laterality: Right;   CYSTOSCOPY W/ URETERAL STENT REMOVAL Bilateral 07/12/2017   Procedure: CYSTOSCOPY WITH STENT EXTRACTION;  Surgeon: Franchot Gallo, MD;  Location: [facility];  Service: Urology;  Laterality: Bilateral;   CYSTOSCOPY/URETEROSCOPY/HOLMIUM LASER/STENT PLACEMENT Right 05/17/2017   Procedure: CYSTOSCOPY/URETEROSCOPY/HOLMIUM LASER/STENT PLACEMENT/BILATERAL STENT PLACEMENTS ,RIGHT STONE EXTRACTION;  Surgeon: Franchot Gallo, MD;  Location: [facility];  Service: Urology;  Laterality: Right;   CYSTOSCOPY/URETEROSCOPY/HOLMIUM LASER/STENT PLACEMENT Bilateral 07/12/2017   Procedure: CYSTOSCOPY/DIAGNOSTIC BILATERAL URETEROSCOPY/BILATERAL STENT PLACEMENT;  Surgeon: Franchot Gallo, MD;  Location: [facility];  Service: Urology;  Laterality: Bilateral;   CYSTOSCOPY/URETEROSCOPY/HOLMIUM LASER/STENT PLACEMENT Bilateral 09/02/2017   Procedure: CYSTOSCOPY/ URETEROSCOPY/ RETROGRADE PYELOGRAM/ STENT EXCHANGE/ STONE EXTRACTION;  Surgeon: Franchot Gallo, MD;  Location: [facility];   Service: Urology;  Laterality: Bilateral;   ESOPHAGOGASTRODUODENOSCOPY (EGD) WITH PROPOFOL N/A 02/10/2018   Normal esophagus, multiple gastric polyps, normal duodenum, hyperplastic polyps, reactive gastropathy   HOLMIUM LASER APPLICATION Left 1/61/0960   Procedure: HOLMIUM LASER APPLICATION;  Surgeon: Franchot Gallo, MD;  Location: [facility];  Service: Urology;  Laterality: Left;   INTRATHECAL PUMP IMPLANTATION  2010   [employer]:  fentanyl, baclofen changed to morphine and baclofen on 07/2012   MALONEY DILATION N/A 02/10/2018   Procedure: MALONEY DILATION;  Surgeon: Daneil Dolin, MD;  [facility];  Service: Endoscopy;  Laterality: N/A;   PICC LINE INSERTION     RIGHT ARM    Allergies:  Allergies  Allergen Reactions   Codeine Other (See Comments)    unknown   Lacosamide Other (See Comments)    Mood changes/agitation   Morphine Other (See Comments)    No rx noted     Zolpidem Other (See Comments)    Per [facility]    Family History:  Family History  Problem Relation Age of Onset   Colon cancer Neg Hx    Colon polyps Neg Hx     Social History:  Social History   Tobacco Use   Smoking status: Never   Smokeless tobacco: Never  Substance Use Topics   Alcohol use: No   Drug use: No    Review of symptoms:  Constitutional:  Negative for unexplained weight loss, night sweats, fever, chills ENT:  Negative for nose bleeds, sinus pain, painful swallowing CV:  Negative for chest pain, shortness of breath, exercise intolerance, palpitations, loss of consciousness Resp:  Negative for cough, wheezing, shortness of breath GI:  Negative for nausea, vomiting, diarrhea, bloody stools GU:  Positives noted in HPI; otherwise negative for gross hematuria, dysuria***, urinary incontinence Neuro:  Negative for seizures, poor balance, limb weakness, slurred speech Psych:  Negative for lack of energy, depression, anxiety Endocrine:  Negative for polydipsia, polyuria, symptoms of  hypoglycemia (dizziness, hunger, sweating) Hematologic:  Negative for anemia, purpura, petechia, prolonged or excessive bleeding, use of anticoagulants***   Physical Exam: There were no vitals taken for this visit.  Constitutional:  Alert and oriented, No acute distress. HEENT: NCAT, moist mucus membranes.  Trachea midline, no masses. Cardiovascular: Regular rate and rhythm without murmur, rub, or gallops No clubbing, cyanosis, or edema***. Respiratory: Normal respiratory effort, clear to auscultation bilaterally GI: Abdomen is soft, nontender, nondistended, no abdominal masses GU: *** BACK:  Non-tender to palpation.  No CVAT Lymph: No cervical or inguinal lymphadenopathy. Skin: No obvious rashes, warm, dry, intact Neurologic: Alert and oriented, Cranial nerves grossly intact, no focal deficits, moving all 4 extremities***. Psychiatric: Appropriate. Normal mood and affect.  Laboratory Data: No results found for this or any previous visit (from the past 24 hour(s)).  Lab Results  Component Value Date   WBC 5.7 01/14/2018   HGB 11.5 (L) 01/14/2018   HCT 39.0 01/14/2018   MCV 98.7 01/14/2018   PLT 141 (L) 01/14/2018    Lab Results  Component Value Date   CREATININE 0.69 01/14/2018    No results found for: "PSA"  No results found for: "TESTOSTERONE"  Lab Results  Component Value Date   HGBA1C 5.7 (H) 07/12/2017    Urinalysis    Component Value Date/Time   COLORURINE BROWN (A) 04/22/2017 0956   APPEARANCEUR TURBID (A) 04/22/2017 0956   LABSPEC 1.025 04/22/2017 0956   PHURINE 5.0 04/22/2017 0956   GLUCOSEU NEGATIVE 04/22/2017 0956   HGBUR LARGE (A) 04/22/2017 0956   BILIRUBINUR MODERATE (A) 04/22/2017 0956   KETONESUR TRACE (A) 04/22/2017 0956   PROTEINUR >300 (A) 04/22/2017 0956   UROBILINOGEN 1.0 11/19/2012 1033   NITRITE POSITIVE (A) 04/22/2017 0956   LEUKOCYTESUR MODERATE (A) 04/22/2017 0956    Lab Results  Component Value Date   BACTERIA MANY (A)  04/22/2017    Pertinent Imaging: Results for orders placed during the hospital encounter of 09/17/14  DG Abd 1 View  Narrative CLINICAL DATA:  Urinary tract infection,, previous CVA  EXAM: ABDOMEN - 1 VIEW  COMPARISON:  Abdominal film of March 10, 2013  FINDINGS: There are coarse stones which project over both renal fossa E. No definite ureteral stones are observed. There are numerous phleboliths in the pelvis. There are surgical clips which project over the lower lumbar spine and right aspect of the sacrum. A coarse calcification to the left of midline in the pelvis is consistent with a fibroid. There is increase colonic stool burden diffusely.  IMPRESSION: 1. There are known bilateral kidney stones. No definite ureteral or bladder stones are observed. 2. Increased colonic stool burden is consistent with clinical constipation.   Electronically Signed By: David  Martinique M.D. On: 09/17/2014 10:37  Results for orders placed during the hospital encounter of 08/11/09  US VenoUS Imaging Bilateral  Narrative Clinical Data: Fever, seizure, swelling in lower extremities question DVT  VENOUS DUPLEX ULTRASOUND OF BILATERAL LOWER EXTREMITIES  Technique:  Gray-scale sonography with graded compression, as well as color Doppler and duplex ultrasound, were performed to evaluate the deep venous system of both lower extremities from the level of the common femoral vein through the popliteal and proximal calf veins.  Spectral Doppler was utilized to evaluate flow at rest and with distal augmentation maneuvers.  Comparison: None.  Findings: Deep venous systems patent and compressible from groin through popliteal fossa bilaterally. Spontaneous venous flow present with intact augmentation and evidence of respiratory phasicity. No intraluminal thrombus identified. Visualized portions of the greater saphenous systems are normal.  IMPRESSION: No evidence of deep venous  thrombosis in the lower extremities.  Provider: Ottis Stain     [place], Berneice Heinrich, PA-C [facility]

## 2021-12-18 ENCOUNTER — Telehealth: Payer: Self-pay

## 2021-12-18 NOTE — Telephone Encounter (Signed)
-----   Message from Reynaldo Minium, [place] sent at 12/16/2021  9:12 AM EDT ----- Please let pt's daughter know that I discussed pt's stones with Dr. Alyson Ingles who agrees that repeat CT is best way to ensure pt does not have obstruction from the stone we talked about during the office visit. I have ordered CT stone study. Pt residing at [facility] and is immobile due to [place], the CT needs to be coordinated with [facility] please.

## 2021-12-18 NOTE — Telephone Encounter (Signed)
Called daughter, no answer. Will attempt at later time.

## 2022-01-27 ENCOUNTER — Ambulatory Visit (HOSPITAL_COMMUNITY): Admission: RE | Admit: 2022-01-27 | Payer: Medicare Other | Source: Ambulatory Visit
